# Patient Record
Sex: Female | Born: 1935
Health system: Southern US, Community
[De-identification: ages and names within clinical notes are randomized; demographics above are authoritative.]

## PROBLEM LIST (undated history)

## (undated) DIAGNOSIS — I82409 Acute embolism and thrombosis of unspecified deep veins of unspecified lower extremity: Secondary | ICD-10-CM

## (undated) DIAGNOSIS — M949 Disorder of cartilage, unspecified: Secondary | ICD-10-CM

## (undated) DIAGNOSIS — K219 Gastro-esophageal reflux disease without esophagitis: Secondary | ICD-10-CM

## (undated) DIAGNOSIS — M899 Disorder of bone, unspecified: Secondary | ICD-10-CM

## (undated) DIAGNOSIS — E210 Primary hyperparathyroidism: Secondary | ICD-10-CM

## (undated) DIAGNOSIS — I10 Essential (primary) hypertension: Secondary | ICD-10-CM

## (undated) HISTORY — DX: Disorder of bone, unspecified: M89.9

## (undated) HISTORY — DX: Disorder of cartilage, unspecified: M94.9

## (undated) HISTORY — DX: Primary hyperparathyroidism: E21.0

## (undated) HISTORY — DX: Essential (primary) hypertension: I10

## (undated) HISTORY — DX: Gastro-esophageal reflux disease without esophagitis: K21.9

## (undated) HISTORY — PX: ABDOMINAL HYSTERECTOMY: SHX81

---

## 1999-03-16 ENCOUNTER — Ambulatory Visit (HOSPITAL_COMMUNITY): Admission: RE | Admit: 1999-03-16 | Discharge: 1999-03-16 | Payer: Self-pay | Admitting: Internal Medicine

## 1999-03-16 ENCOUNTER — Encounter: Payer: Self-pay | Admitting: Internal Medicine

## 2002-01-06 ENCOUNTER — Ambulatory Visit (HOSPITAL_COMMUNITY): Admission: RE | Admit: 2002-01-06 | Discharge: 2002-01-06 | Payer: Self-pay | Admitting: Internal Medicine

## 2002-01-06 ENCOUNTER — Encounter: Payer: Self-pay | Admitting: Internal Medicine

## 2004-11-14 ENCOUNTER — Ambulatory Visit: Payer: Self-pay | Admitting: Internal Medicine

## 2005-03-13 ENCOUNTER — Ambulatory Visit: Payer: Self-pay | Admitting: Internal Medicine

## 2005-06-21 ENCOUNTER — Ambulatory Visit: Payer: Self-pay | Admitting: Internal Medicine

## 2005-09-20 ENCOUNTER — Ambulatory Visit: Payer: Self-pay | Admitting: Internal Medicine

## 2005-10-22 ENCOUNTER — Ambulatory Visit: Payer: Self-pay | Admitting: Internal Medicine

## 2006-02-27 ENCOUNTER — Ambulatory Visit: Payer: Self-pay | Admitting: Internal Medicine

## 2006-08-22 ENCOUNTER — Ambulatory Visit: Payer: Self-pay | Admitting: Internal Medicine

## 2007-01-23 ENCOUNTER — Ambulatory Visit (HOSPITAL_COMMUNITY): Admission: RE | Admit: 2007-01-23 | Discharge: 2007-01-23 | Payer: Self-pay | Admitting: Internal Medicine

## 2007-01-23 ENCOUNTER — Encounter: Payer: Self-pay | Admitting: Internal Medicine

## 2007-02-19 ENCOUNTER — Ambulatory Visit: Payer: Self-pay | Admitting: Internal Medicine

## 2007-02-19 LAB — CONVERTED CEMR LAB
ALT: 15 units/L (ref 0–40)
AST: 22 units/L (ref 0–37)
Albumin: 3.9 g/dL (ref 3.5–5.2)
Alkaline Phosphatase: 76 units/L (ref 39–117)
BUN: 17 mg/dL (ref 6–23)
Basophils Absolute: 0 10*3/uL (ref 0.0–0.1)
Basophils Relative: 0.1 % (ref 0.0–1.0)
Bilirubin, Direct: 0.1 mg/dL (ref 0.0–0.3)
CO2: 32 meq/L (ref 19–32)
Calcium: 10.8 mg/dL — ABNORMAL HIGH (ref 8.4–10.5)
Chloride: 110 meq/L (ref 96–112)
Cholesterol: 223 mg/dL (ref 0–200)
Creatinine, Ser: 0.8 mg/dL (ref 0.4–1.2)
Direct LDL: 145 mg/dL
Eosinophils Absolute: 0.1 10*3/uL (ref 0.0–0.6)
Eosinophils Relative: 1.9 % (ref 0.0–5.0)
GFR calc Af Amer: 90 mL/min
GFR calc non Af Amer: 75 mL/min
Glucose, Bld: 91 mg/dL (ref 70–99)
HCT: 42.8 % (ref 36.0–46.0)
HDL: 66.4 mg/dL (ref >39.0)
Hemoglobin: 14.7 g/dL (ref 12.0–15.0)
Lymphocytes Relative: 22.5 % (ref 12.0–46.0)
MCHC: 34.3 g/dL (ref 30.0–36.0)
MCV: 89.2 fL (ref 78.0–100.0)
Monocytes Absolute: 0.6 10*3/uL (ref 0.2–0.7)
Monocytes Relative: 10.1 % (ref 3.0–11.0)
Neutro Abs: 4 10*3/uL (ref 1.4–7.7)
Neutrophils Relative %: 65.4 % (ref 43.0–77.0)
Platelets: 235 10*3/uL (ref 150–400)
Potassium: 4.1 meq/L (ref 3.5–5.1)
RBC: 4.8 M/uL (ref 3.87–5.11)
RDW: 12.9 % (ref 11.5–14.6)
Sodium: 145 meq/L (ref 135–145)
TSH: 1.43 microintl units/mL (ref 0.35–5.50)
Total Bilirubin: 0.8 mg/dL (ref 0.3–1.2)
Total CHOL/HDL Ratio: 3.4
Total Protein: 6.7 g/dL (ref 6.0–8.3)
Triglycerides: 97 mg/dL (ref 0–149)
VLDL: 19 mg/dL (ref 0–40)
WBC: 6 10*3/uL (ref 4.5–10.5)

## 2007-04-01 ENCOUNTER — Ambulatory Visit: Payer: Self-pay | Admitting: Internal Medicine

## 2007-09-15 ENCOUNTER — Encounter: Payer: Self-pay | Admitting: Internal Medicine

## 2007-10-03 ENCOUNTER — Ambulatory Visit: Payer: Self-pay | Admitting: Internal Medicine

## 2007-10-03 DIAGNOSIS — K219 Gastro-esophageal reflux disease without esophagitis: Secondary | ICD-10-CM

## 2007-10-03 DIAGNOSIS — I10 Essential (primary) hypertension: Secondary | ICD-10-CM | POA: Insufficient documentation

## 2007-10-03 DIAGNOSIS — E210 Primary hyperparathyroidism: Secondary | ICD-10-CM

## 2007-10-03 HISTORY — DX: Essential (primary) hypertension: I10

## 2007-10-03 HISTORY — DX: Primary hyperparathyroidism: E21.0

## 2007-10-03 HISTORY — DX: Gastro-esophageal reflux disease without esophagitis: K21.9

## 2007-10-14 ENCOUNTER — Encounter: Payer: Self-pay | Admitting: Internal Medicine

## 2007-10-14 ENCOUNTER — Ambulatory Visit: Payer: Self-pay | Admitting: Internal Medicine

## 2007-11-04 ENCOUNTER — Ambulatory Visit: Payer: Self-pay | Admitting: Internal Medicine

## 2008-02-03 ENCOUNTER — Ambulatory Visit: Payer: Self-pay | Admitting: Internal Medicine

## 2008-02-03 DIAGNOSIS — M899 Disorder of bone, unspecified: Secondary | ICD-10-CM

## 2008-02-03 DIAGNOSIS — M949 Disorder of cartilage, unspecified: Secondary | ICD-10-CM

## 2008-02-03 HISTORY — DX: Disorder of bone, unspecified: M89.9

## 2008-02-19 ENCOUNTER — Encounter: Payer: Self-pay | Admitting: Internal Medicine

## 2008-05-25 ENCOUNTER — Ambulatory Visit: Payer: Self-pay | Admitting: Internal Medicine

## 2008-06-30 ENCOUNTER — Encounter: Payer: Self-pay | Admitting: Internal Medicine

## 2008-07-01 ENCOUNTER — Ambulatory Visit: Payer: Self-pay | Admitting: Internal Medicine

## 2008-07-02 LAB — CONVERTED CEMR LAB
ALT: 15 units/L (ref 0–35)
AST: 21 units/L (ref 0–37)
Albumin: 3.9 g/dL (ref 3.5–5.2)
Alkaline Phosphatase: 74 units/L (ref 39–117)
BUN: 18 mg/dL (ref 6–23)
Basophils Absolute: 0 10*3/uL (ref 0.0–0.1)
Basophils Relative: 0.4 % (ref 0.0–3.0)
Bilirubin, Direct: 0.2 mg/dL (ref 0.0–0.3)
CO2: 30 meq/L (ref 19–32)
Calcium: 10.8 mg/dL — ABNORMAL HIGH (ref 8.4–10.5)
Chloride: 105 meq/L (ref 96–112)
Creatinine, Ser: 1 mg/dL (ref 0.4–1.2)
Eosinophils Absolute: 0.1 10*3/uL (ref 0.0–0.7)
Eosinophils Relative: 2.6 % (ref 0.0–5.0)
GFR calc Af Amer: 70 mL/min
GFR calc non Af Amer: 57 mL/min
Glucose, Bld: 86 mg/dL (ref 70–99)
HCT: 42.1 % (ref 36.0–46.0)
Hemoglobin: 14.4 g/dL (ref 12.0–15.0)
Lymphocytes Relative: 24 % (ref 12.0–46.0)
MCHC: 34.1 g/dL (ref 30.0–36.0)
MCV: 88.6 fL (ref 78.0–100.0)
Monocytes Absolute: 0.6 10*3/uL (ref 0.1–1.0)
Monocytes Relative: 12 % (ref 3.0–12.0)
Neutro Abs: 2.8 10*3/uL (ref 1.4–7.7)
Neutrophils Relative %: 61 % (ref 43.0–77.0)
Platelets: 214 10*3/uL (ref 150–400)
Potassium: 4.1 meq/L (ref 3.5–5.1)
RBC: 4.75 M/uL (ref 3.87–5.11)
RDW: 12.8 % (ref 11.5–14.6)
Sodium: 141 meq/L (ref 135–145)
TSH: 1.25 microintl units/mL (ref 0.35–5.50)
Total Bilirubin: 0.9 mg/dL (ref 0.3–1.2)
Total Protein: 6.8 g/dL (ref 6.0–8.3)
WBC: 4.6 10*3/uL (ref 4.5–10.5)

## 2008-08-30 ENCOUNTER — Ambulatory Visit (HOSPITAL_COMMUNITY): Admission: RE | Admit: 2008-08-30 | Discharge: 2008-08-30 | Payer: Self-pay | Admitting: Internal Medicine

## 2008-10-28 ENCOUNTER — Ambulatory Visit: Payer: Self-pay | Admitting: Internal Medicine

## 2008-11-02 LAB — CONVERTED CEMR LAB: Calcium: 11.6 mg/dL — ABNORMAL HIGH (ref 8.4–10.5)

## 2009-12-26 ENCOUNTER — Ambulatory Visit: Payer: Self-pay | Admitting: Internal Medicine

## 2010-01-02 ENCOUNTER — Ambulatory Visit: Payer: Self-pay | Admitting: Internal Medicine

## 2010-01-02 ENCOUNTER — Encounter: Payer: Self-pay | Admitting: Internal Medicine

## 2010-01-05 ENCOUNTER — Telehealth: Payer: Self-pay | Admitting: Internal Medicine

## 2010-02-11 ENCOUNTER — Ambulatory Visit: Payer: Self-pay | Admitting: Family Medicine

## 2010-02-11 DIAGNOSIS — L089 Local infection of the skin and subcutaneous tissue, unspecified: Secondary | ICD-10-CM | POA: Insufficient documentation

## 2010-04-25 ENCOUNTER — Ambulatory Visit: Payer: Self-pay | Admitting: Internal Medicine

## 2010-04-25 LAB — CONVERTED CEMR LAB: Calcium: 11.4 mg/dL — ABNORMAL HIGH (ref 8.4–10.5)

## 2010-08-10 ENCOUNTER — Ambulatory Visit: Payer: Self-pay | Admitting: Internal Medicine

## 2010-08-21 ENCOUNTER — Ambulatory Visit: Payer: Self-pay | Admitting: Internal Medicine

## 2010-08-21 LAB — CONVERTED CEMR LAB: Calcium: 11 mg/dL — ABNORMAL HIGH (ref 8.4–10.5)

## 2010-12-10 LAB — CONVERTED CEMR LAB
ALT: 16 units/L (ref 0–35)
AST: 21 units/L (ref 0–37)
Albumin: 4.1 g/dL (ref 3.5–5.2)
Alkaline Phosphatase: 76 units/L (ref 39–117)
BUN: 18 mg/dL (ref 6–23)
Basophils Absolute: 0 10*3/uL (ref 0.0–0.1)
Basophils Relative: 0.1 % (ref 0.0–3.0)
Bilirubin, Direct: 0.1 mg/dL (ref 0.0–0.3)
CO2: 28 meq/L (ref 19–32)
Calcium, Total (PTH): 11.6 mg/dL — ABNORMAL HIGH (ref 8.4–10.5)
Calcium: 11 mg/dL — ABNORMAL HIGH (ref 8.4–10.5)
Chloride: 108 meq/L (ref 96–112)
Cholesterol: 218 mg/dL — ABNORMAL HIGH (ref 0–200)
Creatinine, Ser: 1 mg/dL (ref 0.4–1.2)
Direct LDL: 142.1 mg/dL
Eosinophils Absolute: 0.1 10*3/uL (ref 0.0–0.7)
Eosinophils Relative: 0.9 % (ref 0.0–5.0)
GFR calc non Af Amer: 57.12 mL/min (ref >60)
Glucose, Bld: 89 mg/dL (ref 70–99)
HCT: 42.1 % (ref 36.0–46.0)
HDL: 66.5 mg/dL (ref >39.00)
Hemoglobin: 13.8 g/dL (ref 12.0–15.0)
Lymphocytes Relative: 19 % (ref 12.0–46.0)
Lymphs Abs: 1.3 10*3/uL (ref 0.7–4.0)
MCHC: 32.9 g/dL (ref 30.0–36.0)
MCV: 91 fL (ref 78.0–100.0)
Monocytes Absolute: 0.6 10*3/uL (ref 0.1–1.0)
Monocytes Relative: 9 % (ref 3.0–12.0)
Neutro Abs: 4.6 10*3/uL (ref 1.4–7.7)
Neutrophils Relative %: 71 % (ref 43.0–77.0)
PTH: 227.7 pg/mL — ABNORMAL HIGH (ref 14.0–72.0)
Platelets: 210 10*3/uL (ref 150.0–400.0)
Potassium: 4.3 meq/L (ref 3.5–5.1)
RBC: 4.62 M/uL (ref 3.87–5.11)
RDW: 12.5 % (ref 11.5–14.6)
Sodium: 142 meq/L (ref 135–145)
TSH: 1.1 microintl units/mL (ref 0.35–5.50)
Total Bilirubin: 0.8 mg/dL (ref 0.3–1.2)
Total CHOL/HDL Ratio: 3
Total Protein: 6.7 g/dL (ref 6.0–8.3)
Triglycerides: 78 mg/dL (ref 0.0–149.0)
VLDL: 15.6 mg/dL (ref 0.0–40.0)
WBC: 6.6 10*3/uL (ref 4.5–10.5)

## 2010-12-14 NOTE — Progress Notes (Signed)
Summary: lab report  Phone Note Call from Patient   Caller: Patient Call For: Gordy Savers  MD Summary of Call: wants to know lab report and wants a copy mailed Initial call taken by: Raechel Ache, RN,  January 05, 2010 9:03 AM  Follow-up for Phone Call        spoke with pt - informed of labs , stable , mailed copy to pt. KIK Follow-up by: Duard Brady LPN,  January 05, 2010 10:15 AM

## 2010-12-14 NOTE — Assessment & Plan Note (Signed)
Summary: 4 MONTH FOLLOW UP/CJR   Vital Signs:  Patient profile:   77 year old female Weight:      167 pounds Temp:     97.8 degrees F oral BP sitting:   132 / 78  (left arm) Cuff size:   regular  Vitals Entered By: Duard Brady LPN (April 25, 2010 8:24 AM) CC: 4 mos rov - doing well , has cataract surg on (R) eye Is Patient Diabetic? No   Primary Care Provider:  Gordy Savers  MD  CC:  4 mos rov - doing well  and has cataract surg on (R) eye.  History of Present Illness: 77 year old patient who is seen today for follow-up of hypertensionand primary hyperparathyroidism.  She has a history of gastroesophageal reflux disease.  She is doing quite well.  Since her visit here last.  She has had a normal bone density study performed.  No concerns or complaints.  Reflux symptoms are stable.  Preventive Screening-Counseling & Management  Alcohol-Tobacco     Smoking Status: never  Allergies (verified): No Known Drug Allergies  Past History:  Past Medical History: Reviewed history from 02/03/2008 and no changes required. Hypertension GERD primary hyperparathyroidism Osteopenia  Review of Systems  The patient denies anorexia, fever, weight loss, weight gain, vision loss, decreased hearing, hoarseness, chest pain, syncope, dyspnea on exertion, peripheral edema, prolonged cough, headaches, hemoptysis, abdominal pain, melena, hematochezia, severe indigestion/heartburn, hematuria, incontinence, genital sores, muscle weakness, suspicious skin lesions, transient blindness, difficulty walking, depression, unusual weight change, abnormal bleeding, enlarged lymph nodes, angioedema, and breast masses.    Physical Exam  General:  overweight-appearing.  130/80overweight-appearing.   Head:  Normocephalic and atraumatic without obvious abnormalities. No apparent alopecia or balding. Mouth:  Oral mucosa and oropharynx without lesions or exudates.  Teeth in good repair. Neck:  No  deformities, masses, or tenderness noted. Lungs:  Normal respiratory effort, chest expands symmetrically. Lungs are clear to auscultation, no crackles or wheezes. Heart:  Normal rate and regular rhythm. S1 and S2 normal without gallop, murmur, click, rub or other extra sounds. Abdomen:  obese soft and nontender.  No organomegaly. Msk:  No deformity or scoliosis noted of thoracic or lumbar spine.     Impression & Recommendations:  Problem # 1:  PRIMARY HYPERPARATHYROIDISM (ICD-252.01)  Problem # 2:  HYPERTENSION (ICD-401.9)  Her updated medication list for this problem includes:    Benazepril Hcl 40 Mg Tabs (Benazepril hcl) .Marland Kitchen... 1 once daily    Furosemide 40 Mg Tabs (Furosemide) ..... One daily    Her updated medication list for this problem includes:    Benazepril Hcl 40 Mg Tabs (Benazepril hcl) .Marland Kitchen... 1 once daily    Furosemide 40 Mg Tabs (Furosemide) ..... One daily  Problem # 3:  GERD (ICD-530.81)  Her updated medication list for this problem includes:    [facility] Omeprazole 20 Mg Tbec (Omeprazole) .Marland Kitchen... 1 once daily    Her updated medication list for this problem includes:    [facility] Omeprazole 20 Mg Tbec (Omeprazole) .Marland Kitchen... 1 once daily  Complete Medication List: 1)  Benazepril Hcl 40 Mg Tabs (Benazepril hcl) .Marland Kitchen.. 1 once daily 2)  [facility] Omeprazole 20 Mg Tbec (Omeprazole) .Marland Kitchen.. 1 once daily 3)  Furosemide 40 Mg Tabs (Furosemide) .... One daily 4)  Bactroban 2 % Oint (Mupirocin) .... Use three times a day x 10 days as directed 5)  Travatan Z 0.004 % Soln (Travoprost) .... (r) eye qhs  Other Orders: TLB-Calcium (82310-CA) Prescription Created Electronically (  [email or web address])  Patient Instructions: 1)  Please schedule a follow-up appointment in 4 months. 2)  Limit your Sodium (Salt) to less than 2 grams a day(slightly less than 1/2 a teaspoon) to prevent fluid retention, swelling, or worsening of symptoms. 3)  It is important that you exercise regularly at least 20 minutes 5 times a week. If  you develop chest pain, have severe difficulty breathing, or feel very tired , stop exercising immediately and seek medical attention. 4)  You need to lose weight. Consider a lower calorie diet and regular exercise.  5)  Check your Blood Pressure regularly. If it is above: 150/90 you should make an appointment. Prescriptions: FUROSEMIDE 40 MG  TABS (FUROSEMIDE) one daily  #90 x 6   Entered and Authorized by:   Gordy Savers  MD   Signed by:   Gordy Savers  MD on 04/25/2010   Method used:   Electronically to        [facility]  [phone]* (retail)       [address]       [place], [place]  [address]       Ph: [phone] or [phone]       Fax: [phone]   RxID:   [id number] [facility] OMEPRAZOLE 20 MG  TBEC (OMEPRAZOLE) 1 once daily  #90 x 6   Entered and Authorized by:   Gordy Savers  MD   Signed by:   Gordy Savers  MD on 04/25/2010   Method used:   Electronically to        [facility]  [phone]* (retail)       [address]       [place], [place]  [address]       Ph: [phone] or [phone]       Fax: [phone]   RxID:   [id number] BENAZEPRIL HCL 40 MG  TABS (BENAZEPRIL HCL) 1 once daily  #90 Each x 5   Entered and Authorized by:   Gordy Savers  MD   Signed by:   Gordy Savers  MD on 04/25/2010   Method used:   Electronically to        [facility]  [phone]* (retail)       [address]       [place], [place]  [address]       Ph: [phone] or [phone]       Fax: [phone]   RxID:   [id number]   Appended Document: Orders Update    Clinical Lists Changes  Orders: Added new Service order of Venipuncture [phone]) - Signed

## 2010-12-14 NOTE — Assessment & Plan Note (Signed)
Summary: pt will come in fasting/njr   Vital Signs:  Patient profile:   77 year old female Height:      63 inches Weight:      169 pounds BMI:     30.05 Temp:     97.7 degrees F oral BP sitting:   150 / 90  (right arm) Cuff size:   regular  Vitals Entered By: Duard Brady LPN (December 26, 2009 8:29 AM) CC: CPX - no real c/o , questions r/t bone density   CC:  CPX - no real c/o  and questions r/t bone density.  History of Present Illness: 77 year old patient who is seen today for a comprehensive evaluation.  Medical prompt include hypertension.  She does track her blood pressure at home with nice readings.  She traditionally runs a bit higher in the office setting.  She has a history of primary hyperparathyroidism, but has not been seen in over one year.  She is on furosemide for additional  blood pressure control.  She feels well.  Her only complaints are musculoskeletal.  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  She has a history of osteopenia, but unable to afford treatment  Allergies: No Known Drug Allergies  Past History:  Past Medical History: Reviewed history from 02/03/2008 and no changes required. Hypertension GERD primary hyperparathyroidism Osteopenia  Past Surgical History: gravida one, para one, abortus zero Hysterectomy sigmoidoscopy 2003 Bone Density 2008  Family History: Reviewed history from 10/03/2007 and no changes required. father died age 37  suicidemother died age 27, pancreatic cancer  Two half-brothers one with prostate cancer  Social History: Reviewed history from 07/01/2008 and no changes required. Married Never Smoked  Review of Systems  The patient denies anorexia, fever, weight loss, weight gain, vision loss, decreased hearing, hoarseness, chest pain, syncope, dyspnea on exertion, peripheral edema, prolonged cough, headaches, hemoptysis, abdominal pain, melena, hematochezia, severe indigestion/heartburn, hematuria,  incontinence, genital sores, muscle weakness, suspicious skin lesions, transient blindness, difficulty walking, depression, unusual weight change, abnormal bleeding, enlarged lymph nodes, angioedema, and breast masses.    Physical Exam  General:  overweight-appearing.  140/9overweight-appearing.   Head:  Normocephalic and atraumatic without obvious abnormalities. No apparent alopecia or balding. Eyes:  right thyroidectomy scar Ears:  External ear exam shows no significant lesions or deformities.  Otoscopic examination reveals clear canals, tympanic membranes are intact bilaterally without bulging, retraction, inflammation or discharge. Hearing is grossly normal bilaterally. Nose:  External nasal examination shows no deformity or inflammation. Nasal mucosa are pink and moist without lesions or exudates. Mouth:  Oral mucosa and oropharynx without lesions or exudates.  scattered dentition missing Neck:  No deformities, masses, or tenderness noted. Chest Wall:  No deformities, masses, or tenderness noted. Breasts:  No mass, nodules, thickening, tenderness, bulging, retraction, inflamation, nipple discharge or skin changes noted.   Lungs:  Normal respiratory effort, chest expands symmetrically. Lungs are clear to auscultation, no crackles or wheezes. Heart:  Normal rate and regular rhythm. S1 and S2 normal without gallop, murmur, click, rub or other extra sounds. Abdomen:  Bowel sounds positive,abdomen soft and non-tender without masses, organomegaly or hernias noted. lower midline scar Rectal:  No external abnormalities noted. Normal sphincter tone. No rectal masses or tenderness. Genitalia:  status post hysterectomy Msk:  No deformity or scoliosis noted of thoracic or lumbar spine.   Pulses:  R and L carotid,radial,femoral,dorsalis pedis and posterior tibial pulses are full and equal bilaterally Extremities:  No clubbing, cyanosis, edema, or deformity noted  with normal full range of motion of all  joints.   Neurologic:  No cranial nerve deficits noted. Station and gait are normal. Plantar reflexes are down-going bilaterally. DTRs are symmetrical throughout. Sensory, motor and coordinative functions appear intact. Skin:  Intact without suspicious lesions or rashes Cervical Nodes:  No lymphadenopathy noted Axillary Nodes:  No palpable lymphadenopathy Inguinal Nodes:  No significant adenopathy Psych:  Cognition and judgment appear intact. Alert and cooperative with normal attention span and concentration. No apparent delusions, illusions, hallucinations   Impression & Recommendations:  Problem # 1:  OSTEOPENIA (ICD-733.90)  The following medications were removed from the medication list:    Alendronate Sodium 35 Mg Tabs (Alendronate sodium) ..... One weekly    Actonel 150 Mg Tabs (Risedronate sodium) ..... One monthly    The following medications were removed from the medication list:    Alendronate Sodium 35 Mg Tabs (Alendronate sodium) ..... One weekly    Actonel 150 Mg Tabs (Risedronate sodium) ..... One monthly  Orders: Venipuncture (04540) TLB-Lipid Panel (80061-LIPID) TLB-BMP (Basic Metabolic Panel-BMET) (80048-METABOL) TLB-CBC Platelet - w/Differential (85025-CBCD) TLB-Hepatic/Liver Function Pnl (80076-HEPATIC) TLB-TSH (Thyroid Stimulating Hormone) (84443-TSH)  Problem # 2:  PRIMARY HYPERPARATHYROIDISM (ICD-252.01)  Orders: Venipuncture (98119) TLB-Lipid Panel (80061-LIPID) TLB-BMP (Basic Metabolic Panel-BMET) (80048-METABOL) TLB-CBC Platelet - w/Differential (85025-CBCD) TLB-Hepatic/Liver Function Pnl (80076-HEPATIC) T-Parathyroid Hormone, Intact w/ Calcium (14782-95621)  Problem # 3:  GERD (ICD-530.81)  Her updated medication list for this problem includes:    [facility] Omeprazole 20 Mg Tbec (Omeprazole) .Marland Kitchen... 1 once daily    Her updated medication list for this problem includes:    [facility] Omeprazole 20 Mg Tbec (Omeprazole) .Marland Kitchen... 1 once  daily  Orders: Venipuncture (30865) TLB-Lipid Panel (80061-LIPID) TLB-BMP (Basic Metabolic Panel-BMET) (80048-METABOL) TLB-CBC Platelet - w/Differential (85025-CBCD) TLB-Hepatic/Liver Function Pnl (80076-HEPATIC)  Problem # 4:  HYPERTENSION (ICD-401.9)  The following medications were removed from the medication list:    Amlodipine Besylate 5 Mg Tabs (Amlodipine besylate) ..... One daily Her updated medication list for this problem includes:    Benazepril Hcl 40 Mg Tabs (Benazepril hcl) .Marland Kitchen... 1 once daily    Furosemide 40 Mg Tabs (Furosemide) ..... One daily    The following medications were removed from the medication list:    Amlodipine Besylate 5 Mg Tabs (Amlodipine besylate) ..... One daily Her updated medication list for this problem includes:    Benazepril Hcl 40 Mg Tabs (Benazepril hcl) .Marland Kitchen... 1 once daily    Furosemide 40 Mg Tabs (Furosemide) ..... One daily  Orders: EKG w/ Interpretation (93000) Venipuncture (78469) TLB-Lipid Panel (80061-LIPID) TLB-BMP (Basic Metabolic Panel-BMET) (80048-METABOL) TLB-CBC Platelet - w/Differential (85025-CBCD) TLB-Hepatic/Liver Function Pnl (80076-HEPATIC)  Complete Medication List: 1)  Benazepril Hcl 40 Mg Tabs (Benazepril hcl) .Marland Kitchen.. 1 once daily 2)  [facility]vs Omeprazole 20 Mg Tbec (Omeprazole) .Marland Kitchen.. 1 once daily 3)  Furosemide 40 Mg Tabs (Furosemide) .... One daily  Other Orders: Pelvic & Breast Exam ( Medicare)  (G2952)  Patient Instructions: 1)  Please schedule a follow-up appointment in 4 months. 2)  Limit your Sodium (Salt). 3)  It is important that you exercise regularly at least 20 minutes 5 times a week. If you develop chest pain, have severe difficulty breathing, or feel very tired , stop exercising immediately and seek medical attention. 4)  Check your Blood Pressure regularly. If it is above:  150/90 you should make an appointment. Prescriptions: FUROSEMIDE 40 MG  TABS (FUROSEMIDE) one daily  #90 x 6   Entered and Authorized  by:  Gordy Savers  MD   Signed by:   Gordy Savers  MD on 12/26/2009   Method used:   Print then Give to Patient   RxID:   [id number] [facility] OMEPRAZOLE 20 MG  TBEC (OMEPRAZOLE) 1 once daily  #90 x 6   Entered and Authorized by:   Gordy Savers  MD   Signed by:   Gordy Savers  MD on 12/26/2009   Method used:   Print then Give to Patient   RxID:   [id number] BENAZEPRIL HCL 40 MG  TABS (BENAZEPRIL HCL) 1 once daily  #90 Each x 5   Entered and Authorized by:   Gordy Savers  MD   Signed by:   Gordy Savers  MD on 12/26/2009   Method used:   Print then Give to Patient   RxID:   [id number]

## 2010-12-14 NOTE — Assessment & Plan Note (Signed)
Summary: BUMP IN HER NOSE--INFECTED//VGJ   Vital Signs:  Patient profile:   77 year old female Height:      63 inches Weight:      166 pounds BMI:     29.51 O2 Sat:      97 % on Room air Temp:     97.2 degrees F oral Pulse rate:   69 / minute BP sitting:   160 / 82  (left arm) Cuff size:   regular  Vitals Entered By: Payton Spark CMA (February 11, 2010 9:47 AM)  O2 Flow:  Room air CC: Sinus congestion and pressure around the nose.    Primary Care Provider:  Gordy Savers  MD  CC:  Sinus congestion and pressure around the nose. Annette [name]  History of Present Illness: 77 yo WF presents for pain around her nose that started about 2 wks ago with progressive swelling.  She has a bump inside her nose that is tender.  She has been taking ibuprofen.  She is just using nasal saline and nothing else.  Denies epistaxis.  Has a clear/ yellow discharge.  Denies fevers or chils.  Denies dental pain, sore throat.    Current Medications (verified): 1)  Benazepril Hcl 40 Mg  Tabs (Benazepril Hcl) .Annette [name].. 1 Once Daily 2)  [facility]vs Omeprazole 20 Mg  Tbec (Omeprazole) .Annette [name].. 1 Once Daily 3)  Furosemide 40 Mg  Tabs (Furosemide) .... One Daily  Allergies (verified): No Known Drug Allergies  Past History:  Past Medical History: Reviewed history from 02/03/2008 and no changes required. Hypertension GERD primary hyperparathyroidism Osteopenia  Past Surgical History: Reviewed history from 12/26/2009 and no changes required. gravida one, para one, abortus zero Hysterectomy sigmoidoscopy 2003 Bone Density 2008  Social History: Reviewed history from 07/01/2008 and no changes required. Married Never Smoked  Review of Systems      See HPI  Physical Exam  General:  alert, well-developed, well-nourished, and well-hydrated.   Head:  normocephalic and atraumatic.   Eyes:  conjunctiva clear Nose:  small pustule inside the superior L nares mild hypertrophy of the turbinates with scant rhinorrhea mild  external rhinophyma changes Mouth:  pharynx pink and moist and fair dentition.   Lungs:  Normal respiratory effort, chest expands symmetrically. Lungs are clear to auscultation, no crackles or wheezes. Heart:  Normal rate and regular rhythm. S1 and S2 normal without gallop, murmur, click, rub or other extra sounds. Skin:  color normal.     Impression & Recommendations:  Problem # 1:  UNSPEC LOCAL INFECTION SKIN&SUBCUTANEOUS TISSUE (ICD-686.9) Small L nares  pustule causing localized nasal pain. Treat with topical bactroban ointment.  Avoid touching. Call Dr Kirtland Bouchard for f/u if not improved in 7-10 days.  Complete Medication List: 1)  Benazepril Hcl 40 Mg Tabs (Benazepril hcl) .Annette [name].. 1 once daily 2)  [facility]vs Omeprazole 20 Mg Tbec (Omeprazole) .Annette [name].. 1 once daily 3)  Furosemide 40 Mg Tabs (Furosemide) .... One daily 4)  Bactroban 2 % Oint (Mupirocin) .... Use three times a day x 10 days as directed  Patient Instructions: 1)  use topical Bactroban ointment on nasal infection x 10 days. 2)  Call Dr Kirtland Bouchard if not seeing improvement in 7-10 days.   Prescriptions: BACTROBAN 2 % OINT (MUPIROCIN) use three times a day x 10 days as directed  #22 g x 0   Entered and Authorized by:   Seymour Bars DO   Signed by:   Seymour Bars DO on 02/11/2010   Method used:  Electronically to        [facility]  [phone]* (retail)       [address]       [place], [place]  [address]       Ph: [phone] or [phone]       Fax: [phone]   RxID:   [id number]

## 2010-12-14 NOTE — Assessment & Plan Note (Signed)
Summary: 4 month fup//cm   Vital Signs:  Patient profile:   77 year old female Weight:      163 pounds Temp:     98.1 degrees F oral BP sitting:   140 / 90  (right arm) Cuff size:   regular  Vitals Entered By: Duard Brady LPN (August 21, 2010 10:34 AM) CC: 4 mos rov - doing well Is Patient Diabetic? No   Primary Care Provider:  Gordy Savers  MD  CC:  4 mos rov - doing well.  History of Present Illness: 77 year old patient who is seen today for follow-up.  She has a history of hypertension.  Additionally, she has osteopenia and a history of primary hyperparathyroidism.  She is on medications for chronic gastroesophageal reflux disease.  Since last visit.  She has done well.  There has been some modest weight loss.  No cardiopulmonary complaints.  Her dyspepsia has been stable  Allergies (verified): No Known Drug Allergies  Past History:  Past Medical History: Reviewed history from 02/03/2008 and no changes required. Hypertension GERD primary hyperparathyroidism Osteopenia  Past Surgical History: Reviewed history from 12/26/2009 and no changes required. gravida one, para one, abortus zero Hysterectomy sigmoidoscopy 2003 Bone Density 2008  Family History: Reviewed history from 10/03/2007 and no changes required. father died age 33  suicidemother died age 64, pancreatic cancer  Two half-brothers one with prostate cancer  Review of Systems  The patient denies anorexia, fever, weight loss, weight gain, vision loss, decreased hearing, hoarseness, chest pain, syncope, dyspnea on exertion, peripheral edema, prolonged cough, headaches, hemoptysis, abdominal pain, melena, hematochezia, severe indigestion/heartburn, hematuria, incontinence, genital sores, muscle weakness, suspicious skin lesions, transient blindness, difficulty walking, depression, unusual weight change, abnormal bleeding, enlarged lymph nodes, angioedema, and breast masses.    Physical  Exam  General:  overweight-appearing.  130/82overweight-appearing.   Head:  Normocephalic and atraumatic without obvious abnormalities. No apparent alopecia or balding. Eyes:  No corneal or conjunctival inflammation noted. EOMI. Perrla. Funduscopic exam benign, without hemorrhages, exudates or papilledema. Vision grossly normal. Mouth:  Oral mucosa and oropharynx without lesions or exudates.  Teeth in good repair. Neck:  No deformities, masses, or tenderness noted. Lungs:  Normal respiratory effort, chest expands symmetrically. Lungs are clear to auscultation, no crackles or wheezes. Heart:  Normal rate and regular rhythm. S1 and S2 normal without gallop, murmur, click, rub or other extra sounds. Abdomen:  Bowel sounds positive,abdomen soft and non-tender without masses, organomegaly or hernias noted. Msk:  No deformity or scoliosis noted of thoracic or lumbar spine.   Pulses:  R and L carotid,radial,femoral,dorsalis pedis and posterior tibial pulses are full and equal bilaterally Extremities:  No clubbing, cyanosis, edema, or deformity noted with normal full range of motion of all joints.     Impression & Recommendations:  Problem # 1:  PRIMARY HYPERPARATHYROIDISM (ICD-252.01)  Orders: Venipuncture (16109) TLB-Calcium (82310-CA) Specimen Handling (60454)  Problem # 2:  GERD (ICD-530.81)  Her updated medication list for this problem includes:    [facility]vs Omeprazole 20 Mg Tbec (Omeprazole) .Marland Kitchen... 1 once daily  Her updated medication list for this problem includes:    [facility]vs Omeprazole 20 Mg Tbec (Omeprazole) .Marland Kitchen... 1 once daily  Problem # 3:  HYPERTENSION (ICD-401.9)  Her updated medication list for this problem includes:    Benazepril Hcl 40 Mg Tabs (Benazepril hcl) .Marland Kitchen... 1 once daily    Furosemide 40 Mg Tabs (Furosemide) ..... One daily  Her updated medication list for this problem includes:  Benazepril Hcl 40 Mg Tabs (Benazepril hcl) .Marland Kitchen... 1 once daily    Furosemide 40 Mg Tabs  (Furosemide) ..... One daily  Problem # 4:  OSTEOPENIA (ICD-733.90)  Complete Medication List: 1)  Benazepril Hcl 40 Mg Tabs (Benazepril hcl) .Marland Kitchen.. 1 once daily 2)  [facility]vs Omeprazole 20 Mg Tbec (Omeprazole) .Marland Kitchen.. 1 once daily 3)  Furosemide 40 Mg Tabs (Furosemide) .... One daily 4)  Travatan Z 0.004 % Soln (Travoprost) .... (r) eye qhs  Patient Instructions: 1)  Please schedule a follow-up appointment in 4 months. 2)  Limit your Sodium (Salt) to less than 2 grams a day(slightly less than 1/2 a teaspoon) to prevent fluid retention, swelling, or worsening of symptoms. 3)  It is important that you exercise regularly at least 20 minutes 5 times a week. If you develop chest pain, have severe difficulty breathing, or feel very tired , stop exercising immediately and seek medical attention. 4)  You need to lose weight. Consider a lower calorie diet and regular exercise.  5)  Take calcium +Vitamin D daily. 6)  Check your Blood Pressure regularly. If it is above: 150/90 you should make an appointment.

## 2010-12-14 NOTE — Miscellaneous (Signed)
Summary: BONE DENSITY  Clinical Lists Changes  Orders: Added new Test order of T-Bone Densitometry (77080) - Signed Added new Test order of T-Lumbar Vertebral Assessment (77082) - Signed 

## 2010-12-14 NOTE — Assessment & Plan Note (Signed)
Summary: flu shot//alp  Nurse Visit   Vital Signs:  Patient profile:   77 year old female Temp:     98.2 degrees F oral  Vitals Entered By: Duard Brady LPN (August 10, 2010 12:50 PM) Flu Vaccine Consent Questions     Do you have a history of severe allergic reactions to this vaccine? no    Any prior history of allergic reactions to egg and/or gelatin? no    Do you have a sensitivity to the preservative Thimersol? no    Do you have a past history of Guillan-Barre Syndrome? no    Do you currently have an acute febrile illness? no    Have you ever had a severe reaction to latex? no    Vaccine information given and explained to patient? yes    Are you currently pregnant? no    Lot Number:[id number]   Exp Date:05/12/2011   Site Given  Left Deltoid IM  Allergies: No Known Drug Allergies  Orders Added: 1)  Flu Vaccine 87yrs + MEDICARE PATIENTS [Q2039] 2)  Administration Flu vaccine - MCR [G0008]

## 2010-12-27 ENCOUNTER — Encounter: Payer: Self-pay | Admitting: Internal Medicine

## 2010-12-29 ENCOUNTER — Ambulatory Visit (INDEPENDENT_AMBULATORY_CARE_PROVIDER_SITE_OTHER): Payer: Medicare Other | Admitting: Internal Medicine

## 2010-12-29 ENCOUNTER — Encounter: Payer: Self-pay | Admitting: Internal Medicine

## 2010-12-29 DIAGNOSIS — K219 Gastro-esophageal reflux disease without esophagitis: Secondary | ICD-10-CM

## 2010-12-29 DIAGNOSIS — E210 Primary hyperparathyroidism: Secondary | ICD-10-CM

## 2010-12-29 DIAGNOSIS — I10 Essential (primary) hypertension: Secondary | ICD-10-CM

## 2010-12-29 MED ORDER — OMEPRAZOLE 20 MG PO CPDR: 20.0000 mg | DELAYED_RELEASE_CAPSULE | Freq: Every day | ORAL | Status: DC

## 2010-12-29 MED ORDER — BENAZEPRIL HCL 40 MG PO TABS: 40.0000 mg | ORAL_TABLET | Freq: Every day | ORAL | Status: DC

## 2010-12-29 MED ORDER — FUROSEMIDE 40 MG PO TABS: 40.0000 mg | ORAL_TABLET | Freq: Every day | ORAL | Status: DC

## 2010-12-29 NOTE — Patient Instructions (Signed)
Limit your sodium (Salt) intake   It is important that you exercise regularly, at least 20 minutes 3 to 4 times per week.  If you develop chest pain or shortness of breath seek  medical attention. Please check your blood pressure on a regular basis.  If it is consistently greater than 150/90, please make an office appointment.  Avoids foods high in acid such as tomatoes citrus juices, and spicy foods.  Avoid eating within two hours of lying down or before exercising.  Do not overheat.  Try smaller more frequent meals.  If symptoms persist, elevate the head of her bed 12 inches while sleeping.  Return in 3 months for follow-up

## 2010-12-29 NOTE — Progress Notes (Signed)
  Subjective:    Patient ID: Annette [name], female    DOB: [birth year], 77 y.o.   MRN: [id number]  HPI  77 year old patient who is seen today for follow-up of her hypertension.  This has been stable.  She does monitor home blood pressure readings with nice results. She has primary hyperparathyroidism and serum calcium levels have been stable at approximately 11 mg percent.  She remains asymptomatic. She has a history of osteopenia as well as zoster arthritis, which have been stable She remains on omeprazole for gastroesophageal reflux disease She has glaucoma and cataracts and is scheduled for a eye examinations and    Review of Systems  Constitutional: Negative.   HENT: Negative for hearing loss, congestion, sore throat, rhinorrhea, dental problem, sinus pressure and tinnitus.   Eyes: Negative for pain, discharge and visual disturbance.  Respiratory: Negative for cough and shortness of breath.   Cardiovascular: Negative for chest pain, palpitations and leg swelling.  Gastrointestinal: Negative for nausea, vomiting, abdominal pain, diarrhea, constipation, blood in stool and abdominal distention.  Genitourinary: Negative for dysuria, urgency, frequency, hematuria, flank pain, vaginal bleeding, vaginal discharge, difficulty urinating, vaginal pain and pelvic pain.  Musculoskeletal: Negative for joint swelling, arthralgias and gait problem.  Skin: Negative for rash.  Neurological: Negative for dizziness, syncope, speech difficulty, weakness, numbness and headaches.  Hematological: Negative for adenopathy.  Psychiatric/Behavioral: Negative for behavioral problems, dysphoric mood and agitation. The patient is not nervous/anxious.        Objective:   Physical Exam  Constitutional: She is oriented to person, place, and time. She appears well-developed and well-nourished.       Overweight Blood pressure 130/84  HENT:  Head: Normocephalic.  Right Ear: External ear normal.  Left Ear:  External ear normal.  Mouth/Throat: Oropharynx is clear and moist.  Eyes: Conjunctivae and EOM are normal. Pupils are equal, round, and reactive to light.  Neck: Normal range of motion. Neck supple. No thyromegaly present.  Cardiovascular: Normal rate, regular rhythm, normal heart sounds and intact distal pulses.   Pulmonary/Chest: Effort normal and breath sounds normal.       A few crackles, right base  Abdominal: Soft. Bowel sounds are normal. She exhibits no mass. There is no tenderness.  Musculoskeletal: Normal range of motion.  Lymphadenopathy:    She has no cervical adenopathy.  Neurological: She is alert and oriented to person, place, and time.  Skin: Skin is warm and dry. No rash noted.  Psychiatric: She has a normal mood and affect. Her behavior is normal.          Assessment & Plan:  Hypertension stable.  Will continue present regimen prescriptions refilled Hypercalcemia secondary to primary hyperparathyroidism.  Will continue on furosemide for blood pressure control.  Will check a serum calcium today Gastroesophageal reflux disease, stable

## 2011-03-30 NOTE — Assessment & Plan Note (Signed)
 HEALTHCARE                            [facility] OFFICE NOTE   NAME:Annette [name], Annette [name]                      MRN:          [id number]  DATE:02/19/2007                            DOB:          22-May-1930    A 77 year old female seen today for an annual exam.  She has a history  of hypertension, primary hyperparathyroidism, DJD.  Only complaint today  is pain involving the right knee.  She is status post hysterectomy over  30 years ago.  She is gravida 1, para 1, abortus 0.  She has  gastroesophageal reflux disease which has been well controlled on  omeprazole.  She states that she monitors her blood pressure at home  with readings in the 130/80 range.   REVIEW OF SYSTEMS EXAM:  Had a screening sigmoidoscopy in 2003, declines  full colonoscopy today; also she declines pelvic and rectal exam.   FAMILY HISTORY:  Father died of suicide death at 57.  Mother died at 15  of pancreatic cancer.  Half brother had prostate cancer.  No family  history of colon cancer, although her husband was affected.   EXAMINATION:  Revealed a healthy-appearing, mildly overweight white  female.  Blood pressure is 150-160/90-100.  FUNDI, EAR, NOSE, AND THROAT:  Clear.  NECK:  No bruits.  CHEST:  Clear.  BREASTS:  Negative.  CARDIOVASCULAR:  Normal heart sounds, no murmurs.  ABDOMEN:  Obese, soft, and nontender.  No organomegaly and no bruits  appreciated.  EXTREMITIES:  Revealed full peripheral pulses, there is no edema.  She  had difficulty fully extending her right knee due to discomfort.   IMPRESSION:  1. Hypertension.  2. Degenerative joint disease.  3. Hypercalcemia, secondary to primary hyperparathyroidism.   DISPOSITION:  Calcium level and other chemistries will be reviewed.  A  screening colonoscopy encouraged, she wishes to defer this for a year.  Her mammogram was just performed and was unremarkable.  I have asked her  to return in 6 weeks for a blood pressure  check, she has also been asked  to bring her home cuff.     Gordy Savers, MD  Electronically Signed    PFK/MedQ  DD: 02/19/2007  DT: 02/19/2007  Job #: [id number]

## 2011-04-27 ENCOUNTER — Encounter: Payer: Self-pay | Admitting: Internal Medicine

## 2011-04-27 ENCOUNTER — Ambulatory Visit (INDEPENDENT_AMBULATORY_CARE_PROVIDER_SITE_OTHER): Payer: Medicare Other | Admitting: Internal Medicine

## 2011-04-27 DIAGNOSIS — M949 Disorder of cartilage, unspecified: Secondary | ICD-10-CM

## 2011-04-27 DIAGNOSIS — E785 Hyperlipidemia, unspecified: Secondary | ICD-10-CM

## 2011-04-27 DIAGNOSIS — M899 Disorder of bone, unspecified: Secondary | ICD-10-CM

## 2011-04-27 DIAGNOSIS — I10 Essential (primary) hypertension: Secondary | ICD-10-CM

## 2011-04-27 DIAGNOSIS — E210 Primary hyperparathyroidism: Secondary | ICD-10-CM

## 2011-04-27 DIAGNOSIS — K219 Gastro-esophageal reflux disease without esophagitis: Secondary | ICD-10-CM

## 2011-04-27 LAB — HEPATIC FUNCTION PANEL
ALT: 13 U/L (ref 0–35)
AST: 18 U/L (ref 0–37)
Alkaline Phosphatase: 91 U/L (ref 39–117)
Bilirubin, Direct: 0.2 mg/dL (ref 0.0–0.3)
Total Bilirubin: 0.8 mg/dL (ref 0.3–1.2)
Total Protein: 6.8 g/dL (ref 6.0–8.3)

## 2011-04-27 LAB — CBC WITH DIFFERENTIAL/PLATELET
Basophils Relative: 0.3 % (ref 0.0–3.0)
Eosinophils Relative: 0.9 % (ref 0.0–5.0)
HCT: 39.4 % (ref 36.0–46.0)
Hemoglobin: 13.4 g/dL (ref 12.0–15.0)
Lymphs Abs: 1.2 10*3/uL (ref 0.7–4.0)
MCV: 89 fl (ref 78.0–100.0)
Monocytes Absolute: 0.5 10*3/uL (ref 0.1–1.0)
Monocytes Relative: 8.6 % (ref 3.0–12.0)
Neutro Abs: 4.5 10*3/uL (ref 1.4–7.7)
Platelets: 211 10*3/uL (ref 150.0–400.0)
RBC: 4.43 Mil/uL (ref 3.87–5.11)
WBC: 6.3 10*3/uL (ref 4.5–10.5)

## 2011-04-27 LAB — BASIC METABOLIC PANEL
CO2: 27 mEq/L (ref 19–32)
Calcium: 10.9 mg/dL — ABNORMAL HIGH (ref 8.4–10.5)
Chloride: 105 mEq/L (ref 96–112)
Potassium: 4.6 mEq/L (ref 3.5–5.1)
Sodium: 138 mEq/L (ref 135–145)

## 2011-04-27 LAB — LIPID PANEL: Total CHOL/HDL Ratio: 3

## 2011-04-27 MED ORDER — BENAZEPRIL HCL 40 MG PO TABS: 40.0000 mg | ORAL_TABLET | Freq: Every day | ORAL | Status: DC

## 2011-04-27 MED ORDER — OMEPRAZOLE 20 MG PO CPDR: 20.0000 mg | DELAYED_RELEASE_CAPSULE | Freq: Every day | ORAL | Status: DC

## 2011-04-27 MED ORDER — FUROSEMIDE 40 MG PO TABS: 40.0000 mg | ORAL_TABLET | Freq: Every day | ORAL | Status: DC

## 2011-04-27 NOTE — Progress Notes (Signed)
  Subjective:    Patient ID: Annette [name], female    DOB: 1930-06-21, 77 y.o.   MRN: [id number]  HPI  77 year old patient who is seen today for followup of hypertension. This has been well-controlled on furosemide and Lotensin. She has a history of primary hyperparathyroidism and her last calcium was 11.0. Since her last visit here she has had left cataract extraction surgery. She has had remote right cataract extraction surgery about 2 years ago. She feels well no concerns or complaints. She has a history of gastroesophageal reflux disease which has been stable; she has osteopenia  Review of Systems  Constitutional: Negative.   HENT: Negative for hearing loss, congestion, sore throat, rhinorrhea, dental problem, sinus pressure and tinnitus.   Eyes: Negative for pain, discharge and visual disturbance.  Respiratory: Negative for cough and shortness of breath.   Cardiovascular: Negative for chest pain, palpitations and leg swelling.  Gastrointestinal: Negative for nausea, vomiting, abdominal pain, diarrhea, constipation, blood in stool and abdominal distention.  Genitourinary: Negative for dysuria, urgency, frequency, hematuria, flank pain, vaginal bleeding, vaginal discharge, difficulty urinating, vaginal pain and pelvic pain.  Musculoskeletal: Negative for joint swelling, arthralgias and gait problem.  Skin: Negative for rash.  Neurological: Negative for dizziness, syncope, speech difficulty, weakness, numbness and headaches.  Hematological: Negative for adenopathy.  Psychiatric/Behavioral: Negative for behavioral problems, dysphoric mood and agitation. The patient is not nervous/anxious.        Objective:   Physical Exam  Constitutional: She is oriented to person, place, and time. She appears well-developed and well-nourished.  HENT:  Head: Normocephalic.  Right Ear: External ear normal.  Left Ear: External ear normal.  Mouth/Throat: Oropharynx is clear and moist.  Eyes: Conjunctivae  and EOM are normal. Pupils are equal, round, and reactive to light.  Neck: Normal range of motion. Neck supple. No thyromegaly present.  Cardiovascular: Normal rate, regular rhythm, normal heart sounds and intact distal pulses.   Pulmonary/Chest: Effort normal and breath sounds normal.  Abdominal: Soft. Bowel sounds are normal. She exhibits no mass. There is no tenderness.  Musculoskeletal: Normal range of motion.  Lymphadenopathy:    She has no cervical adenopathy.  Neurological: She is alert and oriented to person, place, and time.  Skin: Skin is warm and dry. No rash noted.  Psychiatric: She has a normal mood and affect. Her behavior is normal.          Assessment & Plan:   Hypertension. Well controlled we'll continue salt restricted diet attempt more exercise and weight loss Primary hyperparathyroidism. The patient wishes to avoid surgery if at all possible. We'll continue furosemide and second calcium level today. Osteopenia. Consider repeat bone density in view of her primary hyper parathyroidism

## 2011-04-27 NOTE — Patient Instructions (Signed)
It is important that you exercise regularly, at least 20 minutes 3 to 4 times per week.  If you develop chest pain or shortness of breath seek  medical attention.  You need to lose weight.  Consider a lower calorie diet and regular exercise.  Limit your sodium (Salt) intake  Take  [phone] units of vitamin D  Return in 4 months for follow-up

## 2011-08-15 ENCOUNTER — Telehealth: Payer: Self-pay | Admitting: *Deleted

## 2011-08-15 NOTE — Telephone Encounter (Addendum)
Pt took a double dose of Benazepril 40 mg.  Total of 80 mg.  Advised her to drink and eat and use some salt.  She feels a little dizzy, otherwise feels ok.  Does not have a BP cuff or a way to check her BP.

## 2011-08-16 NOTE — Telephone Encounter (Signed)
Kim and Dr. Kirtland Bouchard aware as soon as this call came in.

## 2011-08-27 ENCOUNTER — Ambulatory Visit (INDEPENDENT_AMBULATORY_CARE_PROVIDER_SITE_OTHER): Payer: Medicare Other | Admitting: Internal Medicine

## 2011-08-27 ENCOUNTER — Encounter: Payer: Self-pay | Admitting: Internal Medicine

## 2011-08-27 DIAGNOSIS — I10 Essential (primary) hypertension: Secondary | ICD-10-CM

## 2011-08-27 DIAGNOSIS — E210 Primary hyperparathyroidism: Secondary | ICD-10-CM

## 2011-08-27 MED ORDER — FUROSEMIDE 40 MG PO TABS: 40.0000 mg | ORAL_TABLET | Freq: Every day | ORAL | Status: DC

## 2011-08-27 MED ORDER — OMEPRAZOLE 20 MG PO CPDR: 20.0000 mg | DELAYED_RELEASE_CAPSULE | Freq: Every day | ORAL | Status: DC

## 2011-08-27 MED ORDER — BENAZEPRIL HCL 40 MG PO TABS: 40.0000 mg | ORAL_TABLET | Freq: Every day | ORAL | Status: DC

## 2011-08-27 NOTE — Patient Instructions (Signed)
Limit your sodium (Salt) intake    It is important that you exercise regularly, at least 20 minutes 3 to 4 times per week.  If you develop chest pain or shortness of breath seek  medical attention.  Please check your blood pressure on a regular basis.  If it is consistently greater than 150/90, please make an office appointment.  You need to lose weight.  Consider a lower calorie diet and regular exercise.  Return in 6 months for follow-up  

## 2011-08-27 NOTE — Progress Notes (Signed)
  Subjective:    Patient ID: Annette [name], female    DOB: 1930-10-06, 77 y.o.   MRN: [id number]  HPI  77 year old patient who is seen today for followup. She has a history of mild hypercalcemia secondary to primary hyperparathyroidism her last calcium level IV months ago was 10.9. She has treated hypertension on Lotensin as well as furosemide. She is followed by ophthalmology for glaucoma. She is in quite well today she does monitor home blood pressure readings with good blood pressure control    Review of Systems  Constitutional: Negative.   HENT: Negative for hearing loss, congestion, sore throat, rhinorrhea, dental problem, sinus pressure and tinnitus.   Eyes: Negative for pain, discharge and visual disturbance.  Respiratory: Negative for cough and shortness of breath.   Cardiovascular: Negative for chest pain, palpitations and leg swelling.  Gastrointestinal: Negative for nausea, vomiting, abdominal pain, diarrhea, constipation, blood in stool and abdominal distention.  Genitourinary: Negative for dysuria, urgency, frequency, hematuria, flank pain, vaginal bleeding, vaginal discharge, difficulty urinating, vaginal pain and pelvic pain.  Musculoskeletal: Negative for joint swelling, arthralgias and gait problem.  Skin: Negative for rash.  Neurological: Negative for dizziness, syncope, speech difficulty, weakness, numbness and headaches.  Hematological: Negative for adenopathy.  Psychiatric/Behavioral: Negative for behavioral problems, dysphoric mood and agitation. The patient is not nervous/anxious.        Objective:   Physical Exam  Constitutional: She is oriented to person, place, and time. She appears well-developed and well-nourished.       Blood pressure 140/84  HENT:  Head: Normocephalic.  Right Ear: External ear normal.  Left Ear: External ear normal.  Mouth/Throat: Oropharynx is clear and moist.  Eyes: Conjunctivae and EOM are normal. Pupils are equal, round, and  reactive to light.  Neck: Normal range of motion. Neck supple. No thyromegaly present.  Cardiovascular: Normal rate, regular rhythm, normal heart sounds and intact distal pulses.   Pulmonary/Chest: Effort normal and breath sounds normal.  Abdominal: Soft. Bowel sounds are normal. She exhibits no mass. There is no tenderness.  Musculoskeletal: Normal range of motion.  Lymphadenopathy:    She has no cervical adenopathy.  Neurological: She is alert and oriented to person, place, and time.  Skin: Skin is warm and dry. No rash noted.  Psychiatric: She has a normal mood and affect. Her behavior is normal.          Assessment & Plan:   Hypertension. Reasonable control. We'll continue present regimen weight loss low salt diet encouraged Hypercalcemia. We'll check a followup calcium  Recheck 6 months

## 2012-02-11 DIAGNOSIS — H43399 Other vitreous opacities, unspecified eye: Secondary | ICD-10-CM | POA: Diagnosis not present

## 2012-02-11 DIAGNOSIS — H4010X0 Unspecified open-angle glaucoma, stage unspecified: Secondary | ICD-10-CM | POA: Diagnosis not present

## 2012-02-11 DIAGNOSIS — H26499 Other secondary cataract, unspecified eye: Secondary | ICD-10-CM | POA: Diagnosis not present

## 2012-02-25 ENCOUNTER — Ambulatory Visit: Payer: BC Managed Care – PPO | Admitting: Internal Medicine

## 2012-03-18 ENCOUNTER — Ambulatory Visit (INDEPENDENT_AMBULATORY_CARE_PROVIDER_SITE_OTHER): Payer: Medicare Other | Admitting: Internal Medicine

## 2012-03-18 ENCOUNTER — Encounter: Payer: Self-pay | Admitting: Internal Medicine

## 2012-03-18 VITALS — BP 180/90 | Temp 98.6°F | Wt 169.0 lb

## 2012-03-18 DIAGNOSIS — K219 Gastro-esophageal reflux disease without esophagitis: Secondary | ICD-10-CM

## 2012-03-18 DIAGNOSIS — I10 Essential (primary) hypertension: Secondary | ICD-10-CM | POA: Diagnosis not present

## 2012-03-18 DIAGNOSIS — E210 Primary hyperparathyroidism: Secondary | ICD-10-CM | POA: Diagnosis not present

## 2012-03-18 NOTE — Progress Notes (Signed)
  Subjective:    Patient ID: Annette [name], female    DOB: January 08, 1930, 78 y.o.   MRN: [id number]  HPI 77 year old patient who has a history of treated hypertension as well as primary hyperparathyroidism. Over the weekend she developed some swollen right anterior cervical left glans that subsequent have resolved she has been cured for a son who has been ill with an acute febrile illness. She has been under much stress due to the poor health of several close friends. Today she feels improved   Review of Systems  Constitutional: Negative.   HENT: Negative for hearing loss, congestion, sore throat, rhinorrhea, dental problem, sinus pressure and tinnitus.   Eyes: Negative for pain, discharge and visual disturbance.  Respiratory: Negative for cough and shortness of breath.   Cardiovascular: Negative for chest pain, palpitations and leg swelling.  Gastrointestinal: Negative for nausea, vomiting, abdominal pain, diarrhea, constipation, blood in stool and abdominal distention.  Genitourinary: Negative for dysuria, urgency, frequency, hematuria, flank pain, vaginal bleeding, vaginal discharge, difficulty urinating, vaginal pain and pelvic pain.  Musculoskeletal: Negative for joint swelling, arthralgias and gait problem.  Skin: Negative for rash.  Neurological: Negative for dizziness, syncope, speech difficulty, weakness, numbness and headaches.  Hematological: Negative for adenopathy.  Psychiatric/Behavioral: Negative for behavioral problems, dysphoric mood and agitation. The patient is not nervous/anxious.        Objective:   Physical Exam  Constitutional: She is oriented to person, place, and time. She appears well-developed and well-nourished.  HENT:  Head: Normocephalic.  Right Ear: External ear normal.  Left Ear: External ear normal.  Mouth/Throat: Oropharynx is clear and moist.  Eyes: Conjunctivae and EOM are normal. Pupils are equal, round, and reactive to light.  Neck: Normal range of  motion. Neck supple. No thyromegaly present.  Cardiovascular: Normal rate, regular rhythm, normal heart sounds and intact distal pulses.   Pulmonary/Chest: Effort normal and breath sounds normal.  Abdominal: Soft. Bowel sounds are normal. She exhibits no mass. There is no tenderness.  Musculoskeletal: Normal range of motion.  Lymphadenopathy:    She has no cervical adenopathy.  Neurological: She is alert and oriented to person, place, and time.  Skin: Skin is warm and dry. No rash noted.  Psychiatric: She has a normal mood and affect. Her behavior is normal.          Assessment & Plan:   Viral URI Cervical adenopathy resolved Hypertension stable  Schedule CPX in 3 months

## 2012-03-18 NOTE — Patient Instructions (Addendum)
Limit your sodium (Salt) intake    It is important that you exercise regularly, at least 20 minutes 3 to 4 times per week.  If you develop chest pain or shortness of breath seek  medical attention.  Return in 3 months for follow-up  Call or return to clinic prn if these symptoms worsen or fail to improve as anticipated.  

## 2012-06-17 ENCOUNTER — Ambulatory Visit (INDEPENDENT_AMBULATORY_CARE_PROVIDER_SITE_OTHER): Payer: Medicare Other | Admitting: Internal Medicine

## 2012-06-17 ENCOUNTER — Encounter: Payer: Self-pay | Admitting: Internal Medicine

## 2012-06-17 VITALS — BP 128/80 | HR 72 | Temp 98.2°F | Resp 18 | Ht 62.5 in | Wt 166.0 lb

## 2012-06-17 DIAGNOSIS — E785 Hyperlipidemia, unspecified: Secondary | ICD-10-CM

## 2012-06-17 DIAGNOSIS — Z0000 Encounter for general adult medical examination without abnormal findings: Secondary | ICD-10-CM

## 2012-06-17 DIAGNOSIS — E210 Primary hyperparathyroidism: Secondary | ICD-10-CM

## 2012-06-17 DIAGNOSIS — K219 Gastro-esophageal reflux disease without esophagitis: Secondary | ICD-10-CM

## 2012-06-17 DIAGNOSIS — I10 Essential (primary) hypertension: Secondary | ICD-10-CM | POA: Diagnosis not present

## 2012-06-17 LAB — COMPREHENSIVE METABOLIC PANEL
ALT: 14 U/L (ref 0–35)
AST: 22 U/L (ref 0–37)
Albumin: 4 g/dL (ref 3.5–5.2)
Alkaline Phosphatase: 81 U/L (ref 39–117)
Calcium: 11 mg/dL — ABNORMAL HIGH (ref 8.4–10.5)
Chloride: 106 mEq/L (ref 96–112)
Potassium: 5.2 mEq/L — ABNORMAL HIGH (ref 3.5–5.1)
Sodium: 138 mEq/L (ref 135–145)
Total Protein: 6.7 g/dL (ref 6.0–8.3)

## 2012-06-17 LAB — CBC WITH DIFFERENTIAL/PLATELET
Basophils Absolute: 0 10*3/uL (ref 0.0–0.1)
Eosinophils Absolute: 0.1 10*3/uL (ref 0.0–0.7)
Lymphocytes Relative: 25.3 % (ref 12.0–46.0)
MCHC: 33.3 g/dL (ref 30.0–36.0)
MCV: 88.5 fl (ref 78.0–100.0)
Monocytes Absolute: 0.6 10*3/uL (ref 0.1–1.0)
Neutrophils Relative %: 62.5 % (ref 43.0–77.0)
RDW: 14 % (ref 11.5–14.6)

## 2012-06-17 LAB — LIPID PANEL
Total CHOL/HDL Ratio: 3
VLDL: 15.4 mg/dL (ref 0.0–40.0)

## 2012-06-17 LAB — TSH: TSH: 1.04 u[IU]/mL (ref 0.35–5.50)

## 2012-06-17 MED ORDER — BENAZEPRIL HCL 40 MG PO TABS: 40.0000 mg | ORAL_TABLET | Freq: Every day | ORAL | Status: DC

## 2012-06-17 MED ORDER — OMEPRAZOLE 20 MG PO CPDR: 20.0000 mg | DELAYED_RELEASE_CAPSULE | Freq: Every day | ORAL | Status: DC

## 2012-06-17 NOTE — Progress Notes (Signed)
Subjective:    Patient ID: Annette [name], female    DOB: December 20, 1929, 79 y.o.   MRN: [id number]  HPI  78 year old patient who is seen today for a wellness exam. Medical problems include hypertension. This has been well controlled on ACE inhibition. She has a history of glaucoma and is followed by ophthalmology. Additionally she has primary hyperparathyroidism. Calcium levels remain the acceptable. She does have a history of osteopenia but no history of renal stones. She remains asymptomatic. She has gastroesophageal reflux disease which has been controlled on omeprazole.  Past Medical History  Diagnosis Date  . GERD 10/03/2007  . HYPERTENSION 10/03/2007  . OSTEOPENIA 02/03/2008  . Primary hyperparathyroidism 10/03/2007    History   Social History  . Marital Status: Married    Spouse Name: N/A    Number of Children: N/A  . Years of Education: N/A   Occupational History  . Not on file.   Social History Main Topics  . Smoking status: Former Smoker    Quit date: 11/12/1978  . Smokeless tobacco: Never Used  . Alcohol Use: No  . Drug Use: No  . Sexually Active: Not on file   Other Topics Concern  . Not on file   Social History Narrative  . No narrative on file    Past Surgical History  Procedure Date  . Abdominal hysterectomy     No family history on file.  No Known Allergies  Current Outpatient Prescriptions on File Prior to Visit  Medication Sig Dispense Refill  . benazepril (LOTENSIN) 40 MG tablet Take 1 tablet (40 mg total) by mouth daily.  90 tablet  6  . furosemide (LASIX) 40 MG tablet Take 1 tablet (40 mg total) by mouth daily.  90 tablet  6  . latanoprost (XALATAN) 0.005 % ophthalmic solution Place 1 drop into both eyes at bedtime.       Marland Kitchen omeprazole (PRILOSEC) 20 MG capsule Take 1 capsule (20 mg total) by mouth daily.  90 capsule  6    BP 128/80  Pulse 72  Temp 98.2 F (36.8 C) (Oral)  Resp 18  Ht 5' 2.5" (1.588 m)  Wt 166 lb (75.297 kg)  BMI 29.88  kg/m2  SpO2 95%   1. Risk factors, based on past  M,S,F history-  cardiovascular risk factors include a history of hypertension.  2.  Physical activities: No activity restrictions.  3.  Depression/mood: No history depression or mood disorder  4.  Hearing: No deficits  5.  ADL's: Independent in all aspects of daily living  6.  Fall risk: Low  7.  Home safety: No problems identified  8.  Height weight, and visual acuity; height and weight stable no difficulty with visual acuity. Status post bilateral cataract surgery. Requires reading glasses only  9.  Counseling: Heart healthy diet regular exercise modest weight loss all encouraged  10. Lab orders based on risk factors: Laboratory profile will be reviewed calcium level reviewed  11. Referral : Not appropriate at this time  12. Care plan: Regular exercise weight loss encouraged  13. Cognitive assessment: Alert and oriented with normal affect. No cognitive dysfunction.       Review of Systems  Constitutional: Negative for fever, appetite change, fatigue and unexpected weight change.  HENT: Negative for hearing loss, ear pain, nosebleeds, congestion, sore throat, mouth sores, trouble swallowing, neck stiffness, dental problem, voice change, sinus pressure and tinnitus.   Eyes: Negative for photophobia, pain, redness and visual disturbance.  Respiratory: Negative  for cough, chest tightness and shortness of breath.   Cardiovascular: Negative for chest pain, palpitations and leg swelling.  Gastrointestinal: Negative for nausea, vomiting, abdominal pain, diarrhea, constipation, blood in stool, abdominal distention and rectal pain.  Genitourinary: Negative for dysuria, urgency, frequency, hematuria, flank pain, vaginal bleeding, vaginal discharge, difficulty urinating, genital sores, vaginal pain, menstrual problem and pelvic pain.  Musculoskeletal: Negative for back pain and arthralgias.  Skin: Negative for rash.  Neurological:  Negative for dizziness, syncope, speech difficulty, weakness, light-headedness, numbness and headaches.  Hematological: Negative for adenopathy. Does not bruise/bleed easily.  Psychiatric/Behavioral: Negative for suicidal ideas, behavioral problems, self-injury, dysphoric mood and agitation. The patient is not nervous/anxious.        Objective:   Physical Exam  Constitutional: She is oriented to person, place, and time. She appears well-developed and well-nourished.  HENT:  Head: Normocephalic and atraumatic.  Right Ear: External ear normal.  Left Ear: External ear normal.  Mouth/Throat: Oropharynx is clear and moist.  Eyes: Conjunctivae and EOM are normal.  Neck: Normal range of motion. Neck supple. No JVD present. No thyromegaly present.  Cardiovascular: Normal rate, regular rhythm, normal heart sounds and intact distal pulses.   No murmur heard. Pulmonary/Chest: Effort normal and breath sounds normal. She has no wheezes. She has no rales.  Abdominal: Soft. Bowel sounds are normal. She exhibits no distension and no mass. There is no tenderness. There is no rebound and no guarding.  Genitourinary: Vagina normal.  Musculoskeletal: Normal range of motion. She exhibits no edema and no tenderness.  Neurological: She is alert and oriented to person, place, and time. She has normal reflexes. No cranial nerve deficit. She exhibits normal muscle tone. Coordination normal.  Skin: Skin is warm and dry. No rash noted.  Psychiatric: She has a normal mood and affect. Her behavior is normal.          Assessment & Plan:   Preventive health examination Primary hyperparathyroidism. Will check a calcium level GERD stable Hypertension well controlled  Recheck 6 months

## 2012-06-17 NOTE — Progress Notes (Signed)
Quick Note:  Spoke with pt- informed labs normal ______ 

## 2012-06-17 NOTE — Patient Instructions (Signed)
Limit your sodium (Salt) intake  Please check your blood pressure on a regular basis.  If it is consistently greater than 150/90, please make an office appointment.    It is important that you exercise regularly, at least 20 minutes 3 to 4 times per week.  If you develop chest pain or shortness of breath seek  medical attention.  Avoids foods high in acid such as tomatoes citrus juices, and spicy foods.  Avoid eating within two hours of lying down or before exercising.  Do not overheat.  Try smaller more frequent meals.  If symptoms persist, elevate the head of her bed 12 inches while sleeping.  You need to lose weight.  Consider a lower calorie diet and regular exercise.  Return in 6 months for follow-up

## 2012-06-23 ENCOUNTER — Other Ambulatory Visit: Payer: Self-pay

## 2012-08-25 DIAGNOSIS — H4010X0 Unspecified open-angle glaucoma, stage unspecified: Secondary | ICD-10-CM | POA: Diagnosis not present

## 2012-08-25 DIAGNOSIS — H26499 Other secondary cataract, unspecified eye: Secondary | ICD-10-CM | POA: Diagnosis not present

## 2012-10-02 DIAGNOSIS — Z23 Encounter for immunization: Secondary | ICD-10-CM | POA: Diagnosis not present

## 2013-02-23 ENCOUNTER — Other Ambulatory Visit: Payer: Self-pay | Admitting: Internal Medicine

## 2013-03-03 DIAGNOSIS — H43399 Other vitreous opacities, unspecified eye: Secondary | ICD-10-CM | POA: Diagnosis not present

## 2013-03-03 DIAGNOSIS — H35039 Hypertensive retinopathy, unspecified eye: Secondary | ICD-10-CM | POA: Diagnosis not present

## 2013-03-03 DIAGNOSIS — H4010X0 Unspecified open-angle glaucoma, stage unspecified: Secondary | ICD-10-CM | POA: Diagnosis not present

## 2013-03-19 DIAGNOSIS — H43399 Other vitreous opacities, unspecified eye: Secondary | ICD-10-CM | POA: Diagnosis not present

## 2013-03-19 DIAGNOSIS — H4010X0 Unspecified open-angle glaucoma, stage unspecified: Secondary | ICD-10-CM | POA: Diagnosis not present

## 2013-03-19 DIAGNOSIS — D492 Neoplasm of unspecified behavior of bone, soft tissue, and skin: Secondary | ICD-10-CM | POA: Diagnosis not present

## 2013-03-19 DIAGNOSIS — H26499 Other secondary cataract, unspecified eye: Secondary | ICD-10-CM | POA: Diagnosis not present

## 2013-05-22 ENCOUNTER — Other Ambulatory Visit: Payer: Self-pay | Admitting: *Deleted

## 2013-05-22 MED ORDER — FUROSEMIDE 40 MG PO TABS: ORAL_TABLET | ORAL | Status: DC

## 2013-06-25 ENCOUNTER — Other Ambulatory Visit: Payer: Self-pay | Admitting: Internal Medicine

## 2013-06-30 ENCOUNTER — Encounter: Payer: BC Managed Care – PPO | Admitting: Internal Medicine

## 2013-07-30 ENCOUNTER — Ambulatory Visit (INDEPENDENT_AMBULATORY_CARE_PROVIDER_SITE_OTHER): Payer: Medicare Other | Admitting: Internal Medicine

## 2013-07-30 ENCOUNTER — Encounter: Payer: Self-pay | Admitting: Internal Medicine

## 2013-07-30 VITALS — BP 140/90 | HR 83 | Temp 97.9°F | Resp 20 | Wt 162.0 lb

## 2013-07-30 DIAGNOSIS — I10 Essential (primary) hypertension: Secondary | ICD-10-CM

## 2013-07-30 DIAGNOSIS — Z0000 Encounter for general adult medical examination without abnormal findings: Secondary | ICD-10-CM | POA: Diagnosis not present

## 2013-07-30 DIAGNOSIS — J069 Acute upper respiratory infection, unspecified: Secondary | ICD-10-CM

## 2013-07-30 LAB — CBC WITH DIFFERENTIAL/PLATELET
Basophils Absolute: 0 10*3/uL (ref 0.0–0.1)
Eosinophils Absolute: 0.1 10*3/uL (ref 0.0–0.7)
Lymphocytes Relative: 19.4 % (ref 12.0–46.0)
MCHC: 33.5 g/dL (ref 30.0–36.0)
MCV: 89.1 fl (ref 78.0–100.0)
Monocytes Absolute: 0.7 10*3/uL (ref 0.1–1.0)
Neutrophils Relative %: 67.3 % (ref 43.0–77.0)
Platelets: 243 10*3/uL (ref 150.0–400.0)
RDW: 14.2 % (ref 11.5–14.6)

## 2013-07-30 LAB — TSH: TSH: 0.76 u[IU]/mL (ref 0.35–5.50)

## 2013-07-30 LAB — COMPREHENSIVE METABOLIC PANEL
ALT: 16 U/L (ref 0–35)
AST: 18 U/L (ref 0–37)
Albumin: 4 g/dL (ref 3.5–5.2)
BUN: 14 mg/dL (ref 6–23)
Calcium: 11 mg/dL — ABNORMAL HIGH (ref 8.4–10.5)
Chloride: 104 mEq/L (ref 96–112)
Potassium: 4.9 mEq/L (ref 3.5–5.1)
Sodium: 138 mEq/L (ref 135–145)
Total Protein: 6.6 g/dL (ref 6.0–8.3)

## 2013-07-30 LAB — LIPID PANEL
Cholesterol: 200 mg/dL (ref 0–200)
LDL Cholesterol: 117 mg/dL — ABNORMAL HIGH (ref 0–99)
VLDL: 13 mg/dL (ref 0.0–40.0)

## 2013-07-30 NOTE — Progress Notes (Signed)
Subjective:    Patient ID: Annette [name], female    DOB: [birth year], 79 y.o.   MRN: [id number]  HPI  79 year old patient who has treated hypertension. She has been under considerable situational stress due to to the recent poor health and death of her husband. She had been spending considerable time in the hospital during his acute illness. She has developed cough sore throat and chest congestion. She has been expectorating small amount of yellow mucus. There's been no fever chills chest pain or shortness of breath. She states that she has been sleeping well.  Past Medical History  Diagnosis Date  . GERD 10/03/2007  . HYPERTENSION 10/03/2007  . OSTEOPENIA 02/03/2008  . Primary hyperparathyroidism 10/03/2007    History   Social History  . Marital Status: Married    Spouse Name: N/A    Number of Children: N/A  . Years of Education: N/A   Occupational History  . Not on file.   Social History Main Topics  . Smoking status: Former Smoker    Quit date: 11/12/1978  . Smokeless tobacco: Never Used  . Alcohol Use: No  . Drug Use: No  . Sexual Activity: Not on file   Other Topics Concern  . Not on file   Social History Narrative  . No narrative on file    Past Surgical History  Procedure Laterality Date  . Abdominal hysterectomy      No family history on file.  No Known Allergies  Current Outpatient Prescriptions on File Prior to Visit  Medication Sig Dispense Refill  . benazepril (LOTENSIN) 40 MG tablet Take 1 tablet (40 mg total) by mouth daily.  90 tablet  6  . furosemide (LASIX) 40 MG tablet TAKE ONE TABLET BY MOUTH DAILY.  90 tablet  0  . latanoprost (XALATAN) 0.005 % ophthalmic solution Place 1 drop into both eyes at bedtime.       Marland Kitchen omeprazole (PRILOSEC) 20 MG capsule TAKE ONE CAPSULE BY MOUTH DAILY  90 capsule  0   No current facility-administered medications on file prior to visit.    BP 140/90  Pulse 83  Temp(Src) 97.9 F (36.6 C) (Oral)  Resp 20  Wt  162 lb (73.483 kg)  BMI 29.14 kg/m2  SpO2 95%       Review of Systems  Constitutional: Positive for activity change and fatigue.  HENT: Positive for congestion and sore throat. Negative for hearing loss, rhinorrhea, dental problem, sinus pressure and tinnitus.   Eyes: Negative for pain, discharge and visual disturbance.  Respiratory: Positive for cough. Negative for shortness of breath.   Cardiovascular: Negative for chest pain, palpitations and leg swelling.  Gastrointestinal: Negative for nausea, vomiting, abdominal pain, diarrhea, constipation, blood in stool and abdominal distention.  Genitourinary: Negative for dysuria, urgency, frequency, hematuria, flank pain, vaginal bleeding, vaginal discharge, difficulty urinating, vaginal pain and pelvic pain.  Musculoskeletal: Negative for joint swelling, arthralgias and gait problem.  Skin: Negative for rash.  Neurological: Negative for dizziness, syncope, speech difficulty, weakness, numbness and headaches.  Hematological: Negative for adenopathy.  Psychiatric/Behavioral: Negative for behavioral problems, dysphoric mood and agitation. The patient is not nervous/anxious.        Objective:   Physical Exam  Constitutional: She is oriented to person, place, and time. She appears well-developed and well-nourished.  Appears tired but in no acute distress Afebrile Repeat blood pressure 138/80  HENT:  Head: Normocephalic.  Right Ear: External ear normal.  Left Ear: External ear normal.  Mouth/Throat: Oropharynx  is clear and moist.  Eyes: Conjunctivae and EOM are normal. Pupils are equal, round, and reactive to light.  Neck: Normal range of motion. Neck supple. No thyromegaly present.  Cardiovascular: Normal rate, regular rhythm, normal heart sounds and intact distal pulses.   Pulmonary/Chest: Effort normal and breath sounds normal.  Abdominal: Soft. Bowel sounds are normal. She exhibits no mass. There is no tenderness.  Musculoskeletal:  Normal range of motion.  Lymphadenopathy:    She has no cervical adenopathy.  Neurological: She is alert and oriented to person, place, and time.  Skin: Skin is warm and dry. No rash noted.  Psychiatric: She has a normal mood and affect. Her behavior is normal.          Assessment & Plan:   Viral URI with cough Situational stress Hypertension. Reasonable control. Patient is scheduled for a physical later this fall we'll reassess at that time  [facility] treat symptomatically

## 2013-07-30 NOTE — Patient Instructions (Signed)
Acute bronchitis symptoms are generally not helped by antibiotics.  Take over-the-counter expectorants and cough medications such as  Mucinex DM.  Call if there is no improvement in 5 to 7 days or if he developed worsening cough, fever, or new symptoms, such as shortness of breath or chest pain. 

## 2013-07-31 ENCOUNTER — Telehealth: Payer: Self-pay | Admitting: Internal Medicine

## 2013-07-31 NOTE — Telephone Encounter (Signed)
Spoke to pt told her it says former smoker in her chart. Pt verbalized understanding.

## 2013-07-31 NOTE — Telephone Encounter (Signed)
Pt states her print out from visit on 9/17 stated she is a smoker. Pt is NOT a smoker and would like that changed in her records.

## 2013-08-10 ENCOUNTER — Encounter: Payer: Self-pay | Admitting: Internal Medicine

## 2013-08-10 ENCOUNTER — Ambulatory Visit (INDEPENDENT_AMBULATORY_CARE_PROVIDER_SITE_OTHER): Payer: Medicare Other | Admitting: Internal Medicine

## 2013-08-10 VITALS — BP 160/90 | HR 68 | Temp 97.9°F | Resp 20 | Wt 165.0 lb

## 2013-08-10 DIAGNOSIS — Z0000 Encounter for general adult medical examination without abnormal findings: Secondary | ICD-10-CM

## 2013-08-10 DIAGNOSIS — Z23 Encounter for immunization: Secondary | ICD-10-CM | POA: Diagnosis not present

## 2013-08-10 DIAGNOSIS — E210 Primary hyperparathyroidism: Secondary | ICD-10-CM | POA: Diagnosis not present

## 2013-08-10 DIAGNOSIS — K219 Gastro-esophageal reflux disease without esophagitis: Secondary | ICD-10-CM

## 2013-08-10 DIAGNOSIS — I10 Essential (primary) hypertension: Secondary | ICD-10-CM | POA: Diagnosis not present

## 2013-08-10 MED ORDER — FUROSEMIDE 40 MG PO TABS: ORAL_TABLET | ORAL | Status: DC

## 2013-08-10 MED ORDER — OMEPRAZOLE 20 MG PO CPDR: DELAYED_RELEASE_CAPSULE | ORAL | Status: DC

## 2013-08-10 MED ORDER — BENAZEPRIL HCL 40 MG PO TABS: 40.0000 mg | ORAL_TABLET | Freq: Every day | ORAL | Status: DC

## 2013-08-10 NOTE — Progress Notes (Signed)
Patient ID: Annette [name], female   DOB: 18-Jul-1930, 79 y.o.   MRN: [id number]  Subjective:    Patient ID: Annette [name], female    DOB: 13-Oct-1930, 79 y.o.   MRN: [id number]  HPI  79 -year-old patient who is seen today for a wellness exam. Medical problems include hypertension. This has been well controlled on ACE inhibition. She has a history of glaucoma and is followed by ophthalmology. Additionally she has primary hyperparathyroidism. Calcium levels remain acceptable. She does have a history of osteopenia but no history of renal stones. She remains asymptomatic. She has gastroesophageal reflux disease which has been controlled on omeprazole. She has done well over the past year but adjusting to the recent death of her husband  Past Medical History  Diagnosis Date  . GERD 10/03/2007  . HYPERTENSION 10/03/2007  . OSTEOPENIA 02/03/2008  . Primary hyperparathyroidism 10/03/2007    History   Social History  . Marital Status: Married    Spouse Name: N/A    Number of Children: N/A  . Years of Education: N/A   Occupational History  . Not on file.   Social History Main Topics  . Smoking status: Former Smoker    Quit date: 11/12/1978  . Smokeless tobacco: Never Used  . Alcohol Use: No  . Drug Use: No  . Sexual Activity: Not on file   Other Topics Concern  . Not on file   Social History Narrative  . No narrative on file    Past Surgical History  Procedure Laterality Date  . Abdominal hysterectomy      No family history on file.  No Known Allergies  Current Outpatient Prescriptions on File Prior to Visit  Medication Sig Dispense Refill  . latanoprost (XALATAN) 0.005 % ophthalmic solution Place 1 drop into both eyes at bedtime.        No current facility-administered medications on file prior to visit.    BP 160/90  Pulse 68  Temp(Src) 97.9 F (36.6 C) (Oral)  Resp 20  Wt 165 lb (74.844 kg)  BMI 29.68 kg/m2  SpO2 95%   1. Risk factors, based on past  M,S,F  history-  cardiovascular risk factors include a history of hypertension.  2.  Physical activities: No activity restrictions.  3.  Depression/mood: No history depression or mood disorder  4.  Hearing: No deficits  5.  ADL's: Independent in all aspects of daily living  6.  Fall risk: Low  7.  Home safety: No problems identified  8.  Height weight, and visual acuity; height and weight stable no difficulty with visual acuity. Status post bilateral cataract surgery. Requires reading glasses only  9.  Counseling: Heart healthy diet regular exercise modest weight loss all encouraged  10. Lab orders based on risk factors: Laboratory profile will be reviewed calcium level reviewed  11. Referral : Not appropriate at this time  12. Care plan: Regular exercise weight loss encouraged  13. Cognitive assessment: Alert and oriented with normal affect. No cognitive dysfunction.       Review of Systems  Constitutional: Negative for fever, appetite change, fatigue and unexpected weight change.  HENT: Negative for hearing loss, ear pain, nosebleeds, congestion, sore throat, mouth sores, trouble swallowing, neck stiffness, dental problem, voice change, sinus pressure and tinnitus.   Eyes: Negative for photophobia, pain, redness and visual disturbance.  Respiratory: Negative for cough, chest tightness and shortness of breath.   Cardiovascular: Negative for chest pain, palpitations and leg swelling.  Gastrointestinal: Negative  for nausea, vomiting, abdominal pain, diarrhea, constipation, blood in stool, abdominal distention and rectal pain.  Genitourinary: Negative for dysuria, urgency, frequency, hematuria, flank pain, vaginal bleeding, vaginal discharge, difficulty urinating, genital sores, vaginal pain, menstrual problem and pelvic pain.  Musculoskeletal: Negative for back pain and arthralgias.  Skin: Negative for rash.  Neurological: Negative for dizziness, syncope, speech difficulty, weakness,  light-headedness, numbness and headaches.  Hematological: Negative for adenopathy. Does not bruise/bleed easily.  Psychiatric/Behavioral: Negative for suicidal ideas, behavioral problems, self-injury, dysphoric mood and agitation. The patient is not nervous/anxious.        Objective:   Physical Exam  Constitutional: She is oriented to person, place, and time. She appears well-developed and well-nourished.  HENT:  Head: Normocephalic and atraumatic.  Right Ear: External ear normal.  Left Ear: External ear normal.  Mouth/Throat: Oropharynx is clear and moist.  Eyes: Conjunctivae and EOM are normal.  Neck: Normal range of motion. Neck supple. No JVD present. No thyromegaly present.  Cardiovascular: Normal rate, regular rhythm, normal heart sounds and intact distal pulses.   No murmur heard. Pulmonary/Chest: Effort normal and breath sounds normal. She has no wheezes. She has no rales.  Abdominal: Soft. Bowel sounds are normal. She exhibits no distension and no mass. There is no tenderness. There is no rebound and no guarding.  Genitourinary: Vagina normal.  Musculoskeletal: Normal range of motion. She exhibits no edema and no tenderness.  Neurological: She is alert and oriented to person, place, and time. She has normal reflexes. No cranial nerve deficit. She exhibits normal muscle tone. Coordination normal.  Skin: Skin is warm and dry. No rash noted.  Psychiatric: She has a normal mood and affect. Her behavior is normal.          Assessment & Plan:   Preventive health examination Primary hyperparathyroidism. Calcium level is stable GERD stable Hypertension well controlled  Recheck 6-12 months

## 2013-08-10 NOTE — Patient Instructions (Signed)
Limit your sodium (Salt) intake    It is important that you exercise regularly, at least 20 minutes 3 to 4 times per week.  If you develop chest pain or shortness of breath seek  medical attention.  Please check your blood pressure on a regular basis.  If it is consistently greater than 150/90, please make an office appointment.  Return in 6 months for follow-up   

## 2013-09-15 DIAGNOSIS — H4011X0 Primary open-angle glaucoma, stage unspecified: Secondary | ICD-10-CM | POA: Diagnosis not present

## 2013-12-15 ENCOUNTER — Ambulatory Visit (INDEPENDENT_AMBULATORY_CARE_PROVIDER_SITE_OTHER): Payer: Medicare Other | Admitting: Internal Medicine

## 2013-12-15 ENCOUNTER — Encounter: Payer: Self-pay | Admitting: Internal Medicine

## 2013-12-15 VITALS — BP 160/90 | HR 89 | Temp 98.3°F | Resp 20 | Ht 62.5 in | Wt 171.0 lb

## 2013-12-15 DIAGNOSIS — I10 Essential (primary) hypertension: Secondary | ICD-10-CM | POA: Diagnosis not present

## 2013-12-15 DIAGNOSIS — E210 Primary hyperparathyroidism: Secondary | ICD-10-CM

## 2013-12-15 MED ORDER — AMLODIPINE BESYLATE 2.5 MG PO TABS: 2.5000 mg | ORAL_TABLET | Freq: Every day | ORAL | Status: DC

## 2013-12-15 NOTE — Progress Notes (Signed)
Pre-visit discussion using our clinic review tool. No additional management support is needed unless otherwise documented below in the visit note.  

## 2013-12-15 NOTE — Progress Notes (Signed)
Subjective:    Patient ID: Annette [name], female    DOB: 1930-04-29, 80 y.o.   MRN: [id number]  HPI  80 year old patient who has treated hypertension. She also has a history of primary hyperparathyroidism with a stable mildly elevated serum calcium level. The past few days she has had some cough congestion postnasal drip and mild sore throat. She has been using symptomatic therapy and seems to be improving. There's been no fever. Cough is nonproductive. She continues to have some mild chest congestion. Antihypertensive therapy has included furosemide that has been on hold for several days.  BP Readings from Last 3 Encounters:  12/15/13 160/90  08/10/13 160/90  07/30/13 140/90    Past Medical History  Diagnosis Date  . GERD 10/03/2007  . HYPERTENSION 10/03/2007  . OSTEOPENIA 02/03/2008  . Primary hyperparathyroidism 10/03/2007    History   Social History  . Marital Status: Married    Spouse Name: N/A    Number of Children: N/A  . Years of Education: N/A   Occupational History  . Not on file.   Social History Main Topics  . Smoking status: Former Smoker    Quit date: 11/12/1978  . Smokeless tobacco: Never Used  . Alcohol Use: No  . Drug Use: No  . Sexual Activity: Not on file   Other Topics Concern  . Not on file   Social History Narrative  . No narrative on file    Past Surgical History  Procedure Laterality Date  . Abdominal hysterectomy      No family history on file.  No Known Allergies  Current Outpatient Prescriptions on File Prior to Visit  Medication Sig Dispense Refill  . benazepril (LOTENSIN) 40 MG tablet Take 1 tablet (40 mg total) by mouth daily.  90 tablet  3  . latanoprost (XALATAN) 0.005 % ophthalmic solution Place 1 drop into both eyes at bedtime.       Marland Kitchen omeprazole (PRILOSEC) 20 MG capsule TAKE ONE CAPSULE BY MOUTH DAILY  90 capsule  3  . furosemide (LASIX) 40 MG tablet TAKE ONE TABLET BY MOUTH DAILY.  90 tablet  3   No current  facility-administered medications on file prior to visit.    BP 160/90  Pulse 89  Temp(Src) 98.3 F (36.8 C) (Oral)  Resp 20  Ht 5' 2.5" (1.588 m)  Wt 171 lb (77.565 kg)  BMI 30.76 kg/m2  SpO2 96%       Review of Systems  Constitutional: Positive for fatigue.  HENT: Positive for postnasal drip. Negative for congestion, dental problem, hearing loss, rhinorrhea, sinus pressure, sore throat and tinnitus.   Eyes: Negative for pain, discharge and visual disturbance.  Respiratory: Positive for cough. Negative for shortness of breath and wheezing.   Cardiovascular: Negative for chest pain, palpitations and leg swelling.  Gastrointestinal: Negative for nausea, vomiting, abdominal pain, diarrhea, constipation, blood in stool and abdominal distention.  Genitourinary: Negative for dysuria, urgency, frequency, hematuria, flank pain, vaginal bleeding, vaginal discharge, difficulty urinating, vaginal pain and pelvic pain.  Musculoskeletal: Negative for arthralgias, gait problem and joint swelling.  Skin: Negative for rash.  Neurological: Negative for dizziness, syncope, speech difficulty, weakness, numbness and headaches.  Hematological: Negative for adenopathy.  Psychiatric/Behavioral: Negative for behavioral problems, dysphoric mood and agitation. The patient is not nervous/anxious.        Objective:   Physical Exam  Constitutional: She is oriented to person, place, and time. She appears well-developed and well-nourished.  Blood pressure 140-150 over 90-  HENT:  Head: Normocephalic.  Right Ear: External ear normal.  Left Ear: External ear normal.  Mouth/Throat: Oropharynx is clear and moist.  Eyes: Conjunctivae and EOM are normal. Pupils are equal, round, and reactive to light.  Neck: Normal range of motion. Neck supple. No thyromegaly present.  Cardiovascular: Normal rate, regular rhythm, normal heart sounds and intact distal pulses.   Pulmonary/Chest: Effort normal and breath  sounds normal.  Rare scattered rhonchi  Abdominal: Soft. Bowel sounds are normal. She exhibits no mass. There is no tenderness.  Musculoskeletal: Normal range of motion.  Lymphadenopathy:    She has no cervical adenopathy.  Neurological: She is alert and oriented to person, place, and time.  Skin: Skin is warm and dry. No rash noted.  Psychiatric: She has a normal mood and affect. Her behavior is normal.          Assessment & Plan:   Viral URI with cough Hypertension. Suboptimal control. We'll add the amlodipine 2.5 mg daily History of hyper parathyroidism  We'll continue symptomatic treatment

## 2013-12-15 NOTE — Patient Instructions (Signed)
Limit your sodium (Salt) intake  Please check your blood pressure on a regular basis.  If it is consistently greater than 150/90, please make an office appointment.    It is important that you exercise regularly, at least 20 minutes 3 to 4 times per week.  If you develop chest pain or shortness of breath seek  medical attention.  Acute bronchitis symptoms for less than 10 days are generally not helped by antibiotics.  Take over-the-counter expectorants and cough medications such as  Mucinex DM.  Call if there is no improvement in 5 to 7 days or if he developed worsening cough, fever, or new symptoms, such as shortness of breath or chest pain.

## 2013-12-16 ENCOUNTER — Telehealth: Payer: Self-pay | Admitting: Internal Medicine

## 2013-12-16 NOTE — Telephone Encounter (Signed)
Relevant patient education mailed to patient.  

## 2014-01-19 ENCOUNTER — Telehealth: Payer: Self-pay | Admitting: Internal Medicine

## 2014-01-19 NOTE — Telephone Encounter (Signed)
Patient Information:  Caller Name: Darion  Phone: [phone]  Patient: Annette [name], Annette [name]  Gender: Female  DOB: [birth year]  Age: 80 Years  PCP: Bluford Kaufmann (Family Practice > 47yrs old)  Office Follow Up:  Does the office need to follow up with this patient?: No  Instructions For The Office: N/A  RN Note:  States had viral bronchitis in February 2015 and seemed to improve, but onset of "return of cough and wheezing" 01/12/14.  States has also been treated for hypertension, and felt more tired than usual.  States BP is now 110/63.  States she has new onset mild wheezing.  Has no inhaler.  Per cough protocol, disposition see within 4 hours due to presence of wheezing.  Advised UC as office has no appts available today.  Patient refuses to go to [facility]; insists on appt in AM 01/20/14.  Appointment scheduled per patient insistence 01/20/14 0945 with Dr. Elease Hashimoto.  krs/can  Symptoms  Reason For Call & Symptoms: treated for bronchitis in February 2015  Reviewed Health History In EMR: Yes  Reviewed Medications In EMR: Yes  Reviewed Allergies In EMR: Yes  Reviewed Surgeries / Procedures: Yes  Date of Onset of Symptoms: 12/15/2013  Guideline(s) Used:  Cough  Disposition Per Guideline:   Go to Office Now  Reason For Disposition Reached:   Wheezing is present  Advice Given:  N/A  Patient Will Follow Care Advice:  YES  Appointment Scheduled:  01/20/2014 09:45:00 Appointment Scheduled Provider:  Carolann Littler (Family Practice)

## 2014-01-19 NOTE — Telephone Encounter (Signed)
Noted  

## 2014-01-20 ENCOUNTER — Encounter: Payer: Self-pay | Admitting: Family Medicine

## 2014-01-20 ENCOUNTER — Ambulatory Visit (INDEPENDENT_AMBULATORY_CARE_PROVIDER_SITE_OTHER): Payer: Medicare Other | Admitting: Family Medicine

## 2014-01-20 VITALS — BP 130/80 | HR 91 | Temp 98.3°F | Wt 174.0 lb

## 2014-01-20 DIAGNOSIS — R062 Wheezing: Secondary | ICD-10-CM | POA: Diagnosis not present

## 2014-01-20 MED ORDER — ALBUTEROL SULFATE HFA 108 (90 BASE) MCG/ACT IN AERS: 2.0000 | INHALATION_SPRAY | Freq: Four times a day (QID) | RESPIRATORY_TRACT | Status: DC | PRN

## 2014-01-20 MED ORDER — METHYLPREDNISOLONE ACETATE 80 MG/ML IJ SUSP
80.0000 mg | Freq: Once | INTRAMUSCULAR | Status: AC
  Administered 2014-01-20: 80 mg via INTRAMUSCULAR

## 2014-01-20 NOTE — Progress Notes (Signed)
   Subjective:    Patient ID: Annette [name], female    DOB: [birth year], 80 y.o.   MRN: [id number]  HPI Patient seen for acute illness. Back in February she had somewhat similar illness but didn't fully recover. She presents now with one week history of mostly dry cough. Occasionally productive of clear sputum. She had some mild wheezing but no significant dyspnea. She is using Mucinex DM with some relief of cough. Nonsmoker. No fevers or chills. No history of asthma. No active GERD symptoms.  Past Medical History  Diagnosis Date  . GERD 10/03/2007  . HYPERTENSION 10/03/2007  . OSTEOPENIA 02/03/2008  . Primary hyperparathyroidism 10/03/2007   Past Surgical History  Procedure Laterality Date  . Abdominal hysterectomy      reports that she quit smoking about 35 years ago. She has never used smokeless tobacco. She reports that she does not drink alcohol or use illicit drugs. family history is not on file. No Known Allergies    Review of Systems  Constitutional: Negative for fever and chills.  HENT: Negative for congestion.   Respiratory: Positive for cough and wheezing. Negative for shortness of breath.   Cardiovascular: Negative for chest pain and leg swelling.  Neurological: Negative for dizziness.       Objective:   Physical Exam  Constitutional: She appears well-developed and well-nourished.  HENT:  Right Ear: External ear normal.  Left Ear: External ear normal.  Mouth/Throat: Oropharynx is clear and moist.  Cardiovascular: Normal rate.   Pulmonary/Chest: Effort normal.  Patient has some diffuse wheezes. No rales. No retractions. Normal respiratory rate.  Musculoskeletal: She exhibits no edema.          Assessment & Plan:  Cough with bronchospasm. No history of asthma. Suspect triggered by viral illness. Continue Mucinex DM. Depo-Medrol 80 mg IM given. Albuterol inhaler for as needed use. Followup promptly for fever or worsening symptoms

## 2014-01-20 NOTE — Patient Instructions (Signed)
Bronchospasm, Adult °A bronchospasm is when the tubes that carry air in and out of your lungs (airwarys) spasm or tighten. During a bronchospasm it is hard to breathe. This is because the airways get smaller. A bronchospasm can be triggered by: °· Allergies. These may be to animals, pollen, food, or mold. °· Infection. This is a common cause of bronchospasm. °· Exercise. °· Irritants. These include pollution, cigarette smoke, strong odors, aerosol sprays, and paint fumes. °· Weather changes. °· Stress. °· Being emotional. °HOME CARE  °· Always have a plan for getting help. Know when to call your doctor and local emergency services (911 in the [place] Know where you can get emergency care. °· Only take medicines as told by your doctor. °· If you were prescribed an inhaler or nebulizer machine, ask your doctor how to use it correctly. Always use a spacer with your inhaler if you were given one. °· Stay calm during an attack. Try to relax and breathe more slowly. °· Control your home environment: °· Change your heating and air conditioning filter at least once a month. °· Limit your use of fireplaces and wood stoves. °· Do not  smoke. Do not  allow smoking in your home. °· Avoid perfumes and fragrances. °· Get rid of pests (such as roaches and mice) and their droppings. °· Throw away plants if you see mold on them. °· Keep your house clean and dust free. °· Replace carpet with wood, tile, or vinyl flooring. Carpet can trap dander and dust. °· Use allergy-proof pillows, mattress covers, and box spring covers. °· Wash bed sheets and blankets every week in hot water. Dry them in a dryer. °· Use blankets that are made of polyester or cotton. °· Wash hands frequently. °GET HELP IF: °· You have muscle aches. °· You have chest pain. °· The thick spit you spit or cough up (sputum) changes from clear or white to yellow, green, gray, or bloody. °· The thick spit you spit or cough up gets thicker. °· There are problems that may be  related to the medicine you are given such as: °· A rash. °· Itching. °· Swelling. °· Trouble breathing. °GET HELP RIGHT AWAY IF: °· You feel you cannot breathe or catch your breath. °· You cannot stop coughing. °· Your treatment is not helping you breathe better. °MAKE SURE YOU:  °· Understand these instructions. °· Will watch your condition. °· Will get help right away if you are not doing well or get worse. °Document Released: 08/26/2009 Document Revised: 07/01/2013 Document Reviewed: 04/21/2013 °[employer]® Patient Information ©2014 [employer]. ° °

## 2014-01-20 NOTE — Progress Notes (Signed)
Pre visit review using our clinic review tool, if applicable. No additional management support is needed unless otherwise documented below in the visit note. 

## 2014-01-28 ENCOUNTER — Ambulatory Visit: Payer: Medicare Other | Admitting: Internal Medicine

## 2014-02-08 ENCOUNTER — Encounter: Payer: Self-pay | Admitting: Internal Medicine

## 2014-02-08 ENCOUNTER — Ambulatory Visit (INDEPENDENT_AMBULATORY_CARE_PROVIDER_SITE_OTHER): Payer: Medicare Other | Admitting: Internal Medicine

## 2014-02-08 VITALS — BP 144/80 | HR 66 | Temp 98.4°F | Resp 20 | Ht 62.5 in | Wt 176.0 lb

## 2014-02-08 DIAGNOSIS — K219 Gastro-esophageal reflux disease without esophagitis: Secondary | ICD-10-CM | POA: Diagnosis not present

## 2014-02-08 DIAGNOSIS — I10 Essential (primary) hypertension: Secondary | ICD-10-CM | POA: Diagnosis not present

## 2014-02-08 NOTE — Progress Notes (Signed)
Subjective:    Patient ID: Annette [name], female    DOB: 1930-02-22, 80 y.o.   MRN: [id number]  HPI   80 year old patient who is seen today for followup.  She is treated hypertension.  She also has a history of gastro-esophageal reflux disease.  No concerns or complaints today. Mild resolving URI symptoms.  Past Medical History  Diagnosis Date  . GERD 10/03/2007  . HYPERTENSION 10/03/2007  . OSTEOPENIA 02/03/2008  . Primary hyperparathyroidism 10/03/2007    History   Social History  . Marital Status: Married    Spouse Name: N/A    Number of Children: N/A  . Years of Education: N/A   Occupational History  . Not on file.   Social History Main Topics  . Smoking status: Former Smoker    Quit date: 11/12/1978  . Smokeless tobacco: Never Used  . Alcohol Use: No  . Drug Use: No  . Sexual Activity: Not on file   Other Topics Concern  . Not on file   Social History Narrative  . No narrative on file    Past Surgical History  Procedure Laterality Date  . Abdominal hysterectomy      No family history on file.  No Known Allergies  Current Outpatient Prescriptions on File Prior to Visit  Medication Sig Dispense Refill  . amLODipine (NORVASC) 2.5 MG tablet Take 1 tablet (2.5 mg total) by mouth daily.  90 tablet  3  . benazepril (LOTENSIN) 40 MG tablet Take 1 tablet (40 mg total) by mouth daily.  90 tablet  3  . furosemide (LASIX) 40 MG tablet TAKE ONE TABLET BY MOUTH DAILY.  90 tablet  3  . latanoprost (XALATAN) 0.005 % ophthalmic solution Place 1 drop into both eyes at bedtime.       Marland Kitchen omeprazole (PRILOSEC) 20 MG capsule TAKE ONE CAPSULE BY MOUTH DAILY  90 capsule  3  . albuterol (PROVENTIL HFA;VENTOLIN HFA) 108 (90 BASE) MCG/ACT inhaler Inhale 2 puffs into the lungs every 6 (six) hours as needed for wheezing or shortness of breath.  1 Inhaler  0   No current facility-administered medications on file prior to visit.    BP 144/80  Pulse 66  Temp(Src) 98.4 F (36.9  C) (Oral)  Resp 20  Ht 5' 2.5" (1.588 m)  Wt 176 lb (79.833 kg)  BMI 31.66 kg/m2  SpO2 95%       Review of Systems  Constitutional: Negative.   HENT: Negative for congestion, dental problem, hearing loss, rhinorrhea, sinus pressure, sore throat and tinnitus.   Eyes: Negative for pain, discharge and visual disturbance.  Respiratory: Negative for cough and shortness of breath.   Cardiovascular: Negative for chest pain, palpitations and leg swelling.  Gastrointestinal: Negative for nausea, vomiting, abdominal pain, diarrhea, constipation, blood in stool and abdominal distention.  Genitourinary: Negative for dysuria, urgency, frequency, hematuria, flank pain, vaginal bleeding, vaginal discharge, difficulty urinating, vaginal pain and pelvic pain.  Musculoskeletal: Negative for arthralgias, gait problem and joint swelling.  Skin: Negative for rash.  Neurological: Negative for dizziness, syncope, speech difficulty, weakness, numbness and headaches.  Hematological: Negative for adenopathy.  Psychiatric/Behavioral: Negative for behavioral problems, dysphoric mood and agitation. The patient is not nervous/anxious.        Objective:   Physical Exam  Constitutional: She is oriented to person, place, and time. She appears well-developed and well-nourished.  HENT:  Head: Normocephalic.  Right Ear: External ear normal.  Left Ear: External ear normal.  Mouth/Throat: Oropharynx  is clear and moist.  Eyes: Conjunctivae and EOM are normal. Pupils are equal, round, and reactive to light.  Neck: Normal range of motion. Neck supple. No thyromegaly present.  Cardiovascular: Normal rate, regular rhythm, normal heart sounds and intact distal pulses.   Pulmonary/Chest: Effort normal and breath sounds normal.  Few crackles, right base  Abdominal: Soft. Bowel sounds are normal. She exhibits no mass. There is no tenderness.  Musculoskeletal: Normal range of motion.  Lymphadenopathy:    She has no  cervical adenopathy.  Neurological: She is alert and oriented to person, place, and time.  Skin: Skin is warm and dry. No rash noted.  Psychiatric: She has a normal mood and affect. Her behavior is normal.          Assessment & Plan:   Hypertension stable Resolving URI Gastroesophageal reflux disease  CPX 6 months

## 2014-02-08 NOTE — Progress Notes (Signed)
Pre-visit discussion using our clinic review tool. No additional management support is needed unless otherwise documented below in the visit note.  

## 2014-02-08 NOTE — Patient Instructions (Signed)
Limit your sodium (Salt) intake    It is important that you exercise regularly, at least 20 minutes 3 to 4 times per week.  If you develop chest pain or shortness of breath seek  medical attention.  Return in 6 months for follow-up  

## 2014-04-06 DIAGNOSIS — H4011X0 Primary open-angle glaucoma, stage unspecified: Secondary | ICD-10-CM | POA: Diagnosis not present

## 2014-04-06 DIAGNOSIS — H43399 Other vitreous opacities, unspecified eye: Secondary | ICD-10-CM | POA: Diagnosis not present

## 2014-04-06 DIAGNOSIS — H26499 Other secondary cataract, unspecified eye: Secondary | ICD-10-CM | POA: Diagnosis not present

## 2014-07-20 ENCOUNTER — Encounter (HOSPITAL_COMMUNITY): Payer: Self-pay | Admitting: Emergency Medicine

## 2014-07-20 ENCOUNTER — Emergency Department (HOSPITAL_COMMUNITY)
Admission: EM | Admit: 2014-07-20 | Discharge: 2014-07-20 | Disposition: A | Discharge status: Home / Self Care | Payer: Medicare Other | Attending: Emergency Medicine | Admitting: Emergency Medicine

## 2014-07-20 ENCOUNTER — Emergency Department (HOSPITAL_COMMUNITY): Payer: Medicare Other

## 2014-07-20 DIAGNOSIS — F411 Generalized anxiety disorder: Secondary | ICD-10-CM | POA: Diagnosis not present

## 2014-07-20 DIAGNOSIS — Z87891 Personal history of nicotine dependence: Secondary | ICD-10-CM | POA: Diagnosis not present

## 2014-07-20 DIAGNOSIS — Z8739 Personal history of other diseases of the musculoskeletal system and connective tissue: Secondary | ICD-10-CM | POA: Diagnosis not present

## 2014-07-20 DIAGNOSIS — I10 Essential (primary) hypertension: Secondary | ICD-10-CM | POA: Insufficient documentation

## 2014-07-20 DIAGNOSIS — Z8639 Personal history of other endocrine, nutritional and metabolic disease: Secondary | ICD-10-CM | POA: Insufficient documentation

## 2014-07-20 DIAGNOSIS — Z7982 Long term (current) use of aspirin: Secondary | ICD-10-CM | POA: Diagnosis not present

## 2014-07-20 DIAGNOSIS — R42 Dizziness and giddiness: Secondary | ICD-10-CM | POA: Diagnosis not present

## 2014-07-20 DIAGNOSIS — Z79899 Other long term (current) drug therapy: Secondary | ICD-10-CM | POA: Insufficient documentation

## 2014-07-20 DIAGNOSIS — Z862 Personal history of diseases of the blood and blood-forming organs and certain disorders involving the immune mechanism: Secondary | ICD-10-CM | POA: Insufficient documentation

## 2014-07-20 DIAGNOSIS — R059 Cough, unspecified: Secondary | ICD-10-CM | POA: Diagnosis not present

## 2014-07-20 DIAGNOSIS — K219 Gastro-esophageal reflux disease without esophagitis: Secondary | ICD-10-CM | POA: Diagnosis not present

## 2014-07-20 DIAGNOSIS — R05 Cough: Secondary | ICD-10-CM | POA: Diagnosis not present

## 2014-07-20 DIAGNOSIS — R6889 Other general symptoms and signs: Secondary | ICD-10-CM | POA: Diagnosis not present

## 2014-07-20 LAB — CBC WITH DIFFERENTIAL/PLATELET
BASOS ABS: 0 10*3/uL (ref 0.0–0.1)
BASOS PCT: 0 % (ref 0–1)
EOS PCT: 1 % (ref 0–5)
Eosinophils Absolute: 0.1 10*3/uL (ref 0.0–0.7)
HCT: 44 % (ref 36.0–46.0)
Hemoglobin: 14.5 g/dL (ref 12.0–15.0)
LYMPHS PCT: 11 % — AB (ref 12–46)
Lymphs Abs: 0.9 10*3/uL (ref 0.7–4.0)
MCH: 29 pg (ref 26.0–34.0)
MCHC: 33 g/dL (ref 30.0–36.0)
MCV: 88 fL (ref 78.0–100.0)
Monocytes Absolute: 0.9 10*3/uL (ref 0.1–1.0)
Monocytes Relative: 11 % (ref 3–12)
Neutro Abs: 6 10*3/uL (ref 1.7–7.7)
Neutrophils Relative %: 77 % (ref 43–77)
PLATELETS: 236 10*3/uL (ref 150–400)
RBC: 5 MIL/uL (ref 3.87–5.11)
RDW: 13.3 % (ref 11.5–15.5)
WBC: 7.9 10*3/uL (ref 4.0–10.5)

## 2014-07-20 LAB — COMPREHENSIVE METABOLIC PANEL
ALBUMIN: 3.7 g/dL (ref 3.5–5.2)
ALT: 13 U/L (ref 0–35)
ANION GAP: 12 (ref 5–15)
AST: 17 U/L (ref 0–37)
Alkaline Phosphatase: 91 U/L (ref 39–117)
BUN: 16 mg/dL (ref 6–23)
CALCIUM: 11.4 mg/dL — AB (ref 8.4–10.5)
CO2: 23 mEq/L (ref 19–32)
Chloride: 97 mEq/L (ref 96–112)
Creatinine, Ser: 0.77 mg/dL (ref 0.50–1.10)
GFR calc non Af Amer: 77 mL/min — ABNORMAL LOW (ref >90)
GFR, EST AFRICAN AMERICAN: 89 mL/min — AB (ref >90)
Glucose, Bld: 87 mg/dL (ref 70–99)
Potassium: 4.3 mEq/L (ref 3.7–5.3)
SODIUM: 132 meq/L — AB (ref 137–147)
TOTAL PROTEIN: 6.8 g/dL (ref 6.0–8.3)
Total Bilirubin: 0.4 mg/dL (ref 0.3–1.2)

## 2014-07-20 MED ORDER — MECLIZINE HCL 25 MG PO TABS: 25.0000 mg | ORAL_TABLET | Freq: Four times a day (QID) | ORAL | Status: DC | PRN

## 2014-07-20 MED ORDER — MECLIZINE HCL 25 MG PO TABS
25.0000 mg | ORAL_TABLET | Freq: Once | ORAL | Status: AC
  Administered 2014-07-20: 25 mg via ORAL
  Filled 2014-07-20: qty 1

## 2014-07-20 NOTE — ED Notes (Signed)
Per EMS: pt states he is compliant with meds, husband died 1 year ago today due to pt. Pt c/o HTN, pt c/o dizziness, states she normally gets dizzy when BP is low. Was told to eat salty foods by person and pt did.

## 2014-07-20 NOTE — Discharge Instructions (Signed)
Take the meclizine for dizziness. Avoid salty foods for the next several days. Continue taking your blood pressure medications.  Recheck if you get a headache, vomiting or you feel worse.   Vertigo Vertigo means you feel like you or your surroundings are moving when they are not. Vertigo can be dangerous if it occurs when you are at work, driving, or performing difficult activities.  CAUSES  Vertigo occurs when there is a conflict of signals sent to your brain from the visual and sensory systems in your body. There are many different causes of vertigo, including:  Infections, especially in the inner ear.  A bad reaction to a drug or misuse of alcohol and medicines.  Withdrawal from drugs or alcohol.  Rapidly changing positions, such as lying down or rolling over in bed.  A migraine headache.  Decreased blood flow to the brain.  Increased pressure in the brain from a head injury, infection, tumor, or bleeding. SYMPTOMS  You may feel as though the world is spinning around or you are falling to the ground. Because your balance is upset, vertigo can cause nausea and vomiting. You may have involuntary eye movements (nystagmus). DIAGNOSIS  Vertigo is usually diagnosed by physical exam. If the cause of your vertigo is unknown, your caregiver may perform imaging tests, such as an MRI scan (magnetic resonance imaging). TREATMENT  Most cases of vertigo resolve on their own, without treatment. Depending on the cause, your caregiver may prescribe certain medicines. If your vertigo is related to body position issues, your caregiver may recommend movements or procedures to correct the problem. In rare cases, if your vertigo is caused by certain inner ear problems, you may need surgery. HOME CARE INSTRUCTIONS   Follow your caregiver's instructions.  Avoid driving.  Avoid operating heavy machinery.  Avoid performing any tasks that would be dangerous to you or others during a vertigo  episode.  Tell your caregiver if you notice that certain medicines seem to be causing your vertigo. Some of the medicines used to treat vertigo episodes can actually make them worse in some people. SEEK IMMEDIATE MEDICAL CARE IF:   Your medicines do not relieve your vertigo or are making it worse.  You develop problems with talking, walking, weakness, or using your arms, hands, or legs.  You develop severe headaches.  Your nausea or vomiting continues or gets worse.  You develop visual changes.  A family member notices behavioral changes.  Your condition gets worse. MAKE SURE YOU:  Understand these instructions.  Will watch your condition.  Will get help right away if you are not doing well or get worse. Document Released: 08/08/2005 Document Revised: 01/21/2012 Document Reviewed: 05/17/2011 [facility] Patient Information 2015 [place], [place]. This information is not intended to replace advice given to you by your health care provider. Make sure you discuss any questions you have with your health care provider.  Managing Your High Blood Pressure Blood pressure is a measurement of how forceful your blood is pressing against the walls of the arteries. Arteries are muscular tubes within the circulatory system. Blood pressure does not stay the same. Blood pressure rises when you are active, excited, or nervous; and it lowers during sleep and relaxation. If the numbers measuring your blood pressure stay above normal most of the time, you are at risk for health problems. High blood pressure (hypertension) is a long-term (chronic) condition in which blood pressure is elevated. A blood pressure reading is recorded as two numbers, such as 120 over 80 (  or 120/80). The first, higher number is called the systolic pressure. It is a measure of the pressure in your arteries as the heart beats. The second, lower number is called the diastolic pressure. It is a measure of the pressure in your arteries as  the heart relaxes between beats.  Keeping your blood pressure in a normal range is important to your overall health and prevention of health problems, such as heart disease and stroke. When your blood pressure is uncontrolled, your heart has to work harder than normal. High blood pressure is a very common condition in adults because blood pressure tends to rise with age. Men and women are equally likely to have hypertension but at different times in life. Before age 13, men are more likely to have hypertension. After 78 years of age, women are more likely to have it. Hypertension is especially common in African Americans. This condition often has no signs or symptoms. The cause of the condition is usually not known. Your caregiver can help you come up with a plan to keep your blood pressure in a normal, healthy range. BLOOD PRESSURE STAGES Blood pressure is classified into four stages: normal, prehypertension, stage 1, and stage 2. Your blood pressure reading will be used to determine what type of treatment, if any, is necessary. Appropriate treatment options are tied to these four stages:  Normal  Systolic pressure (mm Hg): below 120.  Diastolic pressure (mm Hg): below 80. Prehypertension  Systolic pressure (mm Hg): 120 to 139.  Diastolic pressure (mm Hg): 80 to 89. Stage1  Systolic pressure (mm Hg): 140 to 159.  Diastolic pressure (mm Hg): 90 to 99. Stage2  Systolic pressure (mm Hg): 160 or above.  Diastolic pressure (mm Hg): 100 or above. RISKS RELATED TO HIGH BLOOD PRESSURE Managing your blood pressure is an important responsibility. Uncontrolled high blood pressure can lead to:  A heart attack.  A stroke.  A weakened blood vessel (aneurysm).  Heart failure.  Kidney damage.  Eye damage.  Metabolic syndrome.  Memory and concentration problems. HOW TO MANAGE YOUR BLOOD PRESSURE Blood pressure can be managed effectively with lifestyle changes and medicines (if needed).  Your caregiver will help you come up with a plan to bring your blood pressure within a normal range. Your plan should include the following: Education  Read all information provided by your caregivers about how to control blood pressure.  Educate yourself on the latest guidelines and treatment recommendations. New research is always being done to further define the risks and treatments for high blood pressure. Lifestylechanges  Control your weight.  Avoid smoking.  Stay physically active.  Reduce the amount of salt in your diet.  Reduce stress.  Control any chronic conditions, such as high cholesterol or diabetes.  Reduce your alcohol intake. Medicines  Several medicines (antihypertensive medicines) are available, if needed, to bring blood pressure within a normal range. Communication  Review all the medicines you take with your caregiver because there may be side effects or interactions.  Talk with your caregiver about your diet, exercise habits, and other lifestyle factors that may be contributing to high blood pressure.  See your caregiver regularly. Your caregiver can help you create and adjust your plan for managing high blood pressure. RECOMMENDATIONS FOR TREATMENT AND FOLLOW-UP  The following recommendations are based on current guidelines for managing high blood pressure in nonpregnant adults. Use these recommendations to identify the proper follow-up period or treatment option based on your blood pressure reading. You can discuss  these options with your caregiver.  Systolic pressure of 109 to 323 or diastolic pressure of 80 to 89: Follow up with your caregiver as directed.  Systolic pressure of 557 to 322 or diastolic pressure of 90 to 100: Follow up with your caregiver within 2 months.  Systolic pressure above 025 or diastolic pressure above 427: Follow up with your caregiver within 1 month.  Systolic pressure above 062 or diastolic pressure above 376: Consider  antihypertensive therapy; follow up with your caregiver within 1 week.  Systolic pressure above 283 or diastolic pressure above 151: Begin antihypertensive therapy; follow up with your caregiver within 1 week. Document Released: 07/23/2012 Document Reviewed: 07/23/2012 [facility] Patient Information 2015 [place]. This information is not intended to replace advice given to you by your health care provider. Make sure you discuss any questions you have with your health care provider.

## 2014-07-20 NOTE — ED Provider Notes (Signed)
CSN: [id number]     Arrival date & time 07/20/14  1159 History   First MD Initiated Contact with Patient 07/20/14 1204     Chief Complaint  Patient presents with  . Hypertension     (Consider location/radiation/quality/duration/timing/severity/associated sxs/prior Treatment) HPI Patient reports she normally has a low blood pressure. She states her blood pressures typically 130/80. She had dizziness yesterday which she associates with having a low blood pressure. She states her head was swimming. She tried to take her blood pressure however her machine kept reading "error". Patient states she poured salt in her hand and ate it and then drank water thinking that would help increase her blood pressure. However when her son checked her blood pressure during the night her blood pressure had gone up to 180/90. She took an extra Lotensin at 2 AM. She had been on Norvasc last year however it made her dizzy and she has not been taking it. She did however take 1 at 10 AM this morning. She denies chest pain, shortness of breath or headache. She also reports last night before she starts feeling bad she and her son ate Corazon and she a lot. She reports some diarrhea since that time. She also describes a spinning sensation when she stands up.  Patient states she has had a cough for about 6 months. She was treated by her PCP. She states her cough was almost gone however it seems to be returning. She has an appointment with her PCP later this month.  PCP Dr Burnice Logan  Past Medical History  Diagnosis Date  . GERD 10/03/2007  . HYPERTENSION 10/03/2007  . OSTEOPENIA 02/03/2008  . Primary hyperparathyroidism 10/03/2007   Past Surgical History  Procedure Laterality Date  . Abdominal hysterectomy     No family history on file. History  Substance Use Topics  . Smoking status: Former Smoker    Quit date: 11/12/1978  . Smokeless tobacco: Never Used  . Alcohol Use: No   Lives at home Lives with  son widowed  OB History   Grav Para Term Preterm Abortions TAB SAB Ect Mult Living                 Review of Systems  All other systems reviewed and are negative.     Allergies  Review of patient's allergies indicates no known allergies.  Home Medications   Prior to Admission medications   Medication Sig Start Date End Date Taking? Authorizing Provider  amLODipine (NORVASC) 2.5 MG tablet Take 2.5 mg by mouth daily with breakfast.   Yes Historical Provider, MD  aspirin 81 MG chewable tablet Chew 81 mg by mouth daily.   Yes Historical Provider, MD  benazepril (LOTENSIN) 40 MG tablet Take 40 mg by mouth daily with breakfast.   Yes Historical Provider, MD  furosemide (LASIX) 40 MG tablet Take 40 mg by mouth daily as needed for fluid.   Yes Historical Provider, MD  ibuprofen (ADVIL,MOTRIN) 200 MG tablet Take 400 mg by mouth daily as needed for moderate pain.   Yes Historical Provider, MD  latanoprost (XALATAN) 0.005 % ophthalmic solution Place 1 drop into both eyes at bedtime.  04/21/11  Yes Historical Provider, MD  omeprazole (PRILOSEC) 20 MG capsule Take 20 mg by mouth daily with breakfast.   Yes Historical Provider, MD  meclizine (ANTIVERT) 25 MG tablet Take 1 tablet (25 mg total) by mouth 4 (four) times daily as needed. 07/20/14   Janice Norrie, MD   BP  164/81  Pulse 70  Temp(Src) 98.4 F (36.9 C) (Oral)  Resp 16  SpO2 99%  Vital signs normal except hypertension  Physical Exam  Nursing note and vitals reviewed. Constitutional: She is oriented to person, place, and time. She appears well-developed and well-nourished.  Non-toxic appearance. She does not appear ill. No distress.  HENT:  Head: Normocephalic and atraumatic.  Right Ear: External ear normal.  Left Ear: External ear normal.  Nose: Nose normal. No mucosal edema or rhinorrhea.  Mouth/Throat: Oropharynx is clear and moist and mucous membranes are normal. No dental abscesses or uvula swelling.  Eyes: Conjunctivae and EOM  are normal. Pupils are equal, round, and reactive to light.  Neck: Normal range of motion and full passive range of motion without pain. Neck supple.  Cardiovascular: Normal rate, regular rhythm and normal heart sounds.  Exam reveals no gallop and no friction rub.   No murmur heard. Pulmonary/Chest: Effort normal and breath sounds normal. No respiratory distress. She has no wheezes. She has no rhonchi. She has no rales. She exhibits no tenderness and no crepitus.  Abdominal: Soft. Normal appearance and bowel sounds are normal. She exhibits no distension. There is no tenderness. There is no rebound and no guarding.  Musculoskeletal: Normal range of motion. She exhibits no edema and no tenderness.  Moves all extremities well.   Neurological: She is alert and oriented to person, place, and time. She has normal strength. No cranial nerve deficit.  Skin: Skin is warm, dry and intact. No rash noted. No erythema. No pallor.  Psychiatric: Her behavior is normal. Her mood appears anxious. Her speech is rapid and/or pressured.    ED Course  Procedures (including critical care time)  Medications  meclizine (ANTIVERT) tablet 25 mg (25 mg Oral Given 07/20/14 1324)   Pt reports her dizziness is better. Her BP is 159/89. She was able to get up and use a bedside bed pan by herself.  Pt is eating crackers.    Labs Review Results for orders placed during the hospital encounter of 07/20/14  CBC WITH DIFFERENTIAL      Result Value Ref Range   WBC 7.9  4.0 - 10.5 K/uL   RBC 5.00  3.87 - 5.11 MIL/uL   Hemoglobin 14.5  12.0 - 15.0 g/dL   HCT 44.0  36.0 - 46.0 %   MCV 88.0  78.0 - 100.0 fL   MCH 29.0  26.0 - 34.0 pg   MCHC 33.0  30.0 - 36.0 g/dL   RDW 13.3  11.5 - 15.5 %   Platelets 236  150 - 400 K/uL   Neutrophils Relative % 77  43 - 77 %   Neutro Abs 6.0  1.7 - 7.7 K/uL   Lymphocytes Relative 11 (*) 12 - 46 %   Lymphs Abs 0.9  0.7 - 4.0 K/uL   Monocytes Relative 11  3 - 12 %   Monocytes Absolute 0.9   0.1 - 1.0 K/uL   Eosinophils Relative 1  0 - 5 %   Eosinophils Absolute 0.1  0.0 - 0.7 K/uL   Basophils Relative 0  0 - 1 %   Basophils Absolute 0.0  0.0 - 0.1 K/uL  COMPREHENSIVE METABOLIC PANEL      Result Value Ref Range   Sodium 132 (*) 137 - 147 mEq/L   Potassium 4.3  3.7 - 5.3 mEq/L   Chloride 97  96 - 112 mEq/L   CO2 23  19 - 32 mEq/L  Glucose, Bld 87  70 - 99 mg/dL   BUN 16  6 - 23 mg/dL   Creatinine, Ser 0.77  0.50 - 1.10 mg/dL   Calcium 11.4 (*) 8.4 - 10.5 mg/dL   Total Protein 6.8  6.0 - 8.3 g/dL   Albumin 3.7  3.5 - 5.2 g/dL   AST 17  0 - 37 U/L   ALT 13  0 - 35 U/L   Alkaline Phosphatase 91  39 - 117 U/L   Total Bilirubin 0.4  0.3 - 1.2 mg/dL   GFR calc non Af Amer 77 (*) >90 mL/min   GFR calc Af Amer 89 (*) >90 mL/min   Anion gap 12  5 - 15      Imaging Review Dg Chest 2 View  07/20/2014   CLINICAL DATA:  Cough for 6 months.  EXAM: CHEST  2 VIEW  COMPARISON:  None.  FINDINGS: Heart size and pulmonary vascularity are normal. The lungs are clear but hyperinflated suggesting emphysema. No effusions. No acute osseous abnormality.  IMPRESSION: Findings suggestive of emphysema.   Electronically Signed   By: Rozetta Nunnery M.D.   On: 07/20/2014 13:04     EKG Interpretation None      MDM   Final diagnoses:  Dizziness  Essential hypertension  Vertigo    New Prescriptions   MECLIZINE (ANTIVERT) 25 MG TABLET    Take 1 tablet (25 mg total) by mouth 4 (four) times daily as needed.    Plan discharge   Rolland Porter, MD, Alanson Aly, MD 07/20/14 [phone]

## 2014-07-20 NOTE — ED Notes (Signed)
Bed: AJ51 Expected date:  Expected time:  Means of arrival:  Comments: ems- 80 yo, high blood pressure

## 2014-08-04 DIAGNOSIS — Z23 Encounter for immunization: Secondary | ICD-10-CM | POA: Diagnosis not present

## 2014-08-10 ENCOUNTER — Encounter: Payer: Self-pay | Admitting: Internal Medicine

## 2014-08-10 ENCOUNTER — Ambulatory Visit (INDEPENDENT_AMBULATORY_CARE_PROVIDER_SITE_OTHER): Payer: Medicare Other | Admitting: Internal Medicine

## 2014-08-10 VITALS — BP 130/80 | HR 64 | Temp 97.7°F | Resp 20 | Ht 62.5 in | Wt 185.0 lb

## 2014-08-10 DIAGNOSIS — K219 Gastro-esophageal reflux disease without esophagitis: Secondary | ICD-10-CM

## 2014-08-10 DIAGNOSIS — I10 Essential (primary) hypertension: Secondary | ICD-10-CM | POA: Diagnosis not present

## 2014-08-10 DIAGNOSIS — M899 Disorder of bone, unspecified: Secondary | ICD-10-CM | POA: Diagnosis not present

## 2014-08-10 DIAGNOSIS — M949 Disorder of cartilage, unspecified: Secondary | ICD-10-CM

## 2014-08-10 DIAGNOSIS — Z23 Encounter for immunization: Secondary | ICD-10-CM | POA: Diagnosis not present

## 2014-08-10 DIAGNOSIS — E210 Primary hyperparathyroidism: Secondary | ICD-10-CM | POA: Diagnosis not present

## 2014-08-10 NOTE — Progress Notes (Signed)
Pre visit review using our clinic review tool, if applicable. No additional management support is needed unless otherwise documented below in the visit note. 

## 2014-08-10 NOTE — Patient Instructions (Signed)
Limit your sodium (Salt) intake  Return in 6 months for follow-up  

## 2014-08-10 NOTE — Progress Notes (Signed)
Subjective:    Patient ID: Annette [name], female    DOB: 1929-12-01, 80 y.o.   MRN: [id number]  HPI 80 year old patient who is seen today for her biannual followup.  She has treated hypertension and history of gastroesophageal reflux disease.  She was seen in the ED on September 8 due to vertigo.  This has not reoccurred. She has significant osteoarthritis affecting primarily the knees. No cardiopulmonary complaints.  A chest x-ray was obtained that revealed some hyperinflation  Past Medical History  Diagnosis Date  . GERD 10/03/2007  . HYPERTENSION 10/03/2007  . OSTEOPENIA 02/03/2008  . Primary hyperparathyroidism 10/03/2007    History   Social History  . Marital Status: Married    Spouse Name: N/A    Number of Children: N/A  . Years of Education: N/A   Occupational History  . Not on file.   Social History Main Topics  . Smoking status: Former Smoker    Quit date: 11/12/1978  . Smokeless tobacco: Never Used  . Alcohol Use: No  . Drug Use: No  . Sexual Activity: Not on file   Other Topics Concern  . Not on file   Social History Narrative  . No narrative on file    Past Surgical History  Procedure Laterality Date  . Abdominal hysterectomy      No family history on file.  No Known Allergies  Current Outpatient Prescriptions on File Prior to Visit  Medication Sig Dispense Refill  . amLODipine (NORVASC) 2.5 MG tablet Take 2.5 mg by mouth daily with breakfast.      . aspirin 81 MG chewable tablet Chew 81 mg by mouth daily.      . benazepril (LOTENSIN) 40 MG tablet Take 40 mg by mouth daily with breakfast.      . furosemide (LASIX) 40 MG tablet Take 40 mg by mouth daily as needed for fluid.      Marland Kitchen ibuprofen (ADVIL,MOTRIN) 200 MG tablet Take 400 mg by mouth daily as needed for moderate pain.      Marland Kitchen latanoprost (XALATAN) 0.005 % ophthalmic solution Place 1 drop into both eyes at bedtime.       . meclizine (ANTIVERT) 25 MG tablet Take 1 tablet (25 mg total) by  mouth 4 (four) times daily as needed.  40 tablet  0  . omeprazole (PRILOSEC) 20 MG capsule Take 20 mg by mouth daily with breakfast.       No current facility-administered medications on file prior to visit.    BP 130/80  Pulse 64  Temp(Src) 97.7 F (36.5 C) (Oral)  Resp 20  Ht 5' 2.5" (1.588 m)  Wt 185 lb (83.915 kg)  BMI 33.28 kg/m2  SpO2 96%      Review of Systems  Constitutional: Negative.   HENT: Negative for congestion, dental problem, hearing loss, rhinorrhea, sinus pressure, sore throat and tinnitus.   Eyes: Negative for pain, discharge and visual disturbance.  Respiratory: Negative for cough and shortness of breath.   Cardiovascular: Negative for chest pain, palpitations and leg swelling.  Gastrointestinal: Negative for nausea, vomiting, abdominal pain, diarrhea, constipation, blood in stool and abdominal distention.  Genitourinary: Negative for dysuria, urgency, frequency, hematuria, flank pain, vaginal bleeding, vaginal discharge, difficulty urinating, vaginal pain and pelvic pain.  Musculoskeletal: Positive for gait problem. Negative for arthralgias and joint swelling.       Knee and ankle pain  Skin: Negative for rash.  Neurological: Negative for dizziness, syncope, speech difficulty, weakness, numbness and headaches.  Hematological: Negative for adenopathy.  Psychiatric/Behavioral: Negative for behavioral problems, dysphoric mood and agitation. The patient is not nervous/anxious.        Objective:   Physical Exam  Constitutional: She is oriented to person, place, and time. She appears well-developed and well-nourished.  Obese.  Blood pressure well controlled  HENT:  Head: Normocephalic.  Right Ear: External ear normal.  Left Ear: External ear normal.  Mouth/Throat: Oropharynx is clear and moist.  Eyes: Conjunctivae and EOM are normal. Pupils are equal, round, and reactive to light.  Neck: Normal range of motion. Neck supple. No thyromegaly present.    Cardiovascular: Normal rate, regular rhythm, normal heart sounds and intact distal pulses.   Pulmonary/Chest: Effort normal and breath sounds normal. No respiratory distress. She has no wheezes. She has no rales.  Abdominal: Soft. Bowel sounds are normal. She exhibits no mass. There is no tenderness.  Musculoskeletal: Normal range of motion.  Lymphadenopathy:    She has no cervical adenopathy.  Neurological: She is alert and oriented to person, place, and time.  Skin: Skin is warm and dry. No rash noted.  Psychiatric: She has a normal mood and affect. Her behavior is normal.          Assessment & Plan:   Hypertension well controlled Exogenous obesity.  Weight loss encouraged Osteoarthritis Gastroesophageal reflux disease.  Continue Prilosec  Recheck in 6 months

## 2014-08-16 ENCOUNTER — Other Ambulatory Visit: Payer: Self-pay | Admitting: Internal Medicine

## 2014-09-13 ENCOUNTER — Other Ambulatory Visit: Payer: Self-pay | Admitting: Internal Medicine

## 2014-09-23 ENCOUNTER — Telehealth: Payer: Self-pay | Admitting: Internal Medicine

## 2014-09-23 NOTE — Telephone Encounter (Signed)
Pt would like you to know she will be switching to [facility] from now on. Mcarthur Rossetti will be sending a fax.Marland Kitchen

## 2014-09-23 NOTE — Telephone Encounter (Signed)
Pharmacy changed

## 2014-09-24 ENCOUNTER — Telehealth: Payer: Self-pay | Admitting: Internal Medicine

## 2014-09-24 MED ORDER — AMLODIPINE BESYLATE 2.5 MG PO TABS: 2.5000 mg | ORAL_TABLET | Freq: Every day | ORAL | Status: DC

## 2014-09-24 MED ORDER — IBUPROFEN 200 MG PO TABS: 400.0000 mg | ORAL_TABLET | Freq: Every day | ORAL | Status: DC | PRN

## 2014-09-24 MED ORDER — OMEPRAZOLE 20 MG PO CPDR: DELAYED_RELEASE_CAPSULE | ORAL | Status: DC

## 2014-09-24 MED ORDER — BENAZEPRIL HCL 40 MG PO TABS: ORAL_TABLET | ORAL | Status: DC

## 2014-09-24 MED ORDER — FUROSEMIDE 40 MG PO TABS: 40.0000 mg | ORAL_TABLET | Freq: Every day | ORAL | Status: DC | PRN

## 2014-09-24 NOTE — Telephone Encounter (Signed)
[name], Annette Dean [phone] is requesting re-fills on amLODipine (NORVASC) 2.5 MG tablet , benazepril (LOTENSIN) 40 MG tablet, furosemide (LASIX) 40 MG tablet, omeprazole (PRILOSEC) 20 MG capsule, ibuprofen (ADVIL,MOTRIN) 200 MG tablet

## 2014-09-24 NOTE — Telephone Encounter (Signed)
Rx's sent to [facility] 

## 2014-12-17 ENCOUNTER — Other Ambulatory Visit: Payer: Self-pay | Admitting: Internal Medicine

## 2014-12-17 ENCOUNTER — Ambulatory Visit (INDEPENDENT_AMBULATORY_CARE_PROVIDER_SITE_OTHER): Payer: Commercial Managed Care - HMO | Admitting: Internal Medicine

## 2014-12-17 ENCOUNTER — Encounter: Payer: Self-pay | Admitting: Internal Medicine

## 2014-12-17 VITALS — BP 138/68 | HR 78 | Temp 97.7°F | Resp 20 | Ht 62.5 in | Wt 183.0 lb

## 2014-12-17 DIAGNOSIS — M949 Disorder of cartilage, unspecified: Secondary | ICD-10-CM

## 2014-12-17 DIAGNOSIS — M899 Disorder of bone, unspecified: Secondary | ICD-10-CM

## 2014-12-17 DIAGNOSIS — R531 Weakness: Secondary | ICD-10-CM

## 2014-12-17 DIAGNOSIS — R2681 Unsteadiness on feet: Secondary | ICD-10-CM

## 2014-12-17 DIAGNOSIS — I10 Essential (primary) hypertension: Secondary | ICD-10-CM | POA: Diagnosis not present

## 2014-12-17 DIAGNOSIS — E210 Primary hyperparathyroidism: Secondary | ICD-10-CM | POA: Diagnosis not present

## 2014-12-17 DIAGNOSIS — Z9181 History of falling: Secondary | ICD-10-CM

## 2014-12-17 LAB — COMPREHENSIVE METABOLIC PANEL
ALBUMIN: 4.1 g/dL (ref 3.5–5.2)
ALT: 18 U/L (ref 0–35)
AST: 20 U/L (ref 0–37)
Alkaline Phosphatase: 89 U/L (ref 39–117)
BUN: 19 mg/dL (ref 6–23)
CALCIUM: 11 mg/dL — AB (ref 8.4–10.5)
CO2: 28 mEq/L (ref 19–32)
CREATININE: 1.08 mg/dL (ref 0.40–1.20)
Chloride: 96 mEq/L (ref 96–112)
GFR: 51.6 mL/min — ABNORMAL LOW (ref >60.00)
Glucose, Bld: 100 mg/dL — ABNORMAL HIGH (ref 70–99)
Potassium: 4.3 mEq/L (ref 3.5–5.1)
SODIUM: 130 meq/L — AB (ref 135–145)
Total Bilirubin: 0.5 mg/dL (ref 0.2–1.2)
Total Protein: 6.8 g/dL (ref 6.0–8.3)

## 2014-12-17 MED ORDER — MECLIZINE HCL 25 MG PO TABS: 25.0000 mg | ORAL_TABLET | Freq: Four times a day (QID) | ORAL | Status: DC | PRN

## 2014-12-17 NOTE — Progress Notes (Signed)
Pre visit review using our clinic review tool, if applicable. No additional management support is needed unless otherwise documented below in the visit note. 

## 2014-12-17 NOTE — Patient Instructions (Signed)
Hypercalcemia Hypercalcemia means the calcium in your blood is too high. Calcium in our blood is important for the control of many things, such as:  Blood clotting.  Conducting of nerve impulses.  Muscle contraction.  Maintaining teeth and bone health.  Other body functions. In the bloodstream, calcium maintains a constant balance with another mineral, phosphate. Calcium is absorbed into the body through the small intestine. This is helped by vitamin D. Calcium levels are maintained mostly by vitamin D and a hormone (parathyroid hormone). But the kidneys also help. Hypercalcemia can happen when the concentration of calcium is too high for the kidneys to maintain balance. The body maintains a balance between the calcium we eat and the calcium already in our body. If calcium intake is increased or we cannot use calcium properly, there may be problems. Some common sources of calcium are:   Dairy products.  Nuts.  Eggs.  Whole grains.  Legumes.  Green leafy vegetables. CAUSES There are many causes of this condition, but some common ones are:  Hyperparathyroidism. This is an overactivity of the parathyroid gland.  Cancers of the breast, kidney, lung, head, and neck are common causes of calcium increases.  Medications that cause you to urinate more often (diuretics), nausea, vomiting, and diarrhea also increase the calcium in the blood.  Overuse of calcium-containing antacids. SYMPTOMS  Many patients with mild hypercalcemia have no symptoms. For those with symptoms, common problems include:  Loss of appetite.  Constipation.  Increased thirst.  Heart rhythm changes.  Abnormal thinking.  Nausea.  Abdominal pain.  Kidney stones.  Mood swings.  Coma and death when severe.  Vomiting.  Increased urination.  High blood pressure.  Confusion. DIAGNOSIS   Your caregiver will do a medical history and perform a physical exam on you.  Calcium and parathyroid hormone  (PTH) may be measured with a blood test. TREATMENT   The treatment depends on the calcium level and what is causing the higher level. Hypercalcemia can be life threatening. Fast lowering of the calcium level may be necessary.  With normal kidney function, fluids can be given by vein to clear the excess calcium. Hemodialysis works well to reduce dangerous calcium levels if there is poor kidney function. This is a procedure in which a machine is used to filter out unwanted substances. The blood is then returned to the body.  Drugs, such as diuretics, can be given after adequate fluid intake is established. These medications help the kidneys get rid of extra calcium. Drugs that lessen (inhibit) bone loss are helpful in gaining long-term control. Phosphate pills help lower high calcium levels caused by a low supply of phosphate. Anti-inflammatory agents such as steroids are helpful with some cancers and toxic levels of vitamin D.  Treatment of the underlying cause of the hypercalcemia will also correct the imbalance. Hyperparathyroidism is usually treated by surgical removal of one or more of the parathyroid glands and any tissue, other than the glands themselves, that is producing too much hormone.  The hypercalcemia caused by cancer is difficult to treat without controlling the cancer. Symptoms can be improved with fluids and drug therapy as outlined above. PROGNOSIS   Surgery to remove the parathyroid glands is usually successful. This also depends on the amount of damage to the kidneys and whether or not it can be treated.  Mild hypercalcemia can be controlled with good fluid intake and the use of effective medications.  Hypercalcemia often develops as a late complication of cancer. The expected outlook   is poor without effective anticancer therapy. PREVENTION   If you are at risk for developing hypercalcemia, be familiar with early symptoms. Report these to your caregiver.  Good fluid intake  (up to four quarts of liquid a day if possible) is helpful.  Try to control nausea and vomiting, and treat fevers to avoid dehydration.  Lowering the amount of calcium in your diet is not necessary. High blood calcium reduces absorption of calcium in the intestine.  Stay as active as possible. SEEK IMMEDIATE MEDICAL CARE IF:   You develop chest pain, sweating, or shortness of breath.  You get confused, feel faint or pass out.  You develop severe nausea and vomiting. MAKE SURE YOU:   Understand these instructions.  Will watch your condition.  Will get help right away if you are not doing well or get worse. Document Released: 01/12/2005 Document Revised: 03/15/2014 Document Reviewed: 10/24/2010 [facility] Patient Information 2015 [place], [place]. This information is not intended to replace advice given to you by your health care provider. Make sure you discuss any questions you have with your health care provider. Hypercalcemia Hypercalcemia means the calcium in your blood is too high. Calcium in our blood is important for the control of many things, such as:  Blood clotting.  Conducting of nerve impulses.  Muscle contraction.  Maintaining teeth and bone health.  Other body functions. In the bloodstream, calcium maintains a constant balance with another mineral, phosphate. Calcium is absorbed into the body through the small intestine. This is helped by vitamin D. Calcium levels are maintained mostly by vitamin D and a hormone (parathyroid hormone). But the kidneys also help. Hypercalcemia can happen when the concentration of calcium is too high for the kidneys to maintain balance. The body maintains a balance between the calcium we eat and the calcium already in our body. If calcium intake is increased or we cannot use calcium properly, there may be problems. Some common sources of calcium are:   Dairy products.  Nuts.  Eggs.  Whole grains.  Legumes.  Green leafy  vegetables. CAUSES There are many causes of this condition, but some common ones are:  Hyperparathyroidism. This is an overactivity of the parathyroid gland.  Cancers of the breast, kidney, lung, head, and neck are common causes of calcium increases.  Medications that cause you to urinate more often (diuretics), nausea, vomiting, and diarrhea also increase the calcium in the blood.  Overuse of calcium-containing antacids. SYMPTOMS  Many patients with mild hypercalcemia have no symptoms. For those with symptoms, common problems include:  Loss of appetite.  Constipation.  Increased thirst.  Heart rhythm changes.  Abnormal thinking.  Nausea.  Abdominal pain.  Kidney stones.  Mood swings.  Coma and death when severe.  Vomiting.  Increased urination.  High blood pressure.  Confusion. DIAGNOSIS   Your caregiver will do a medical history and perform a physical exam on you.  Calcium and parathyroid hormone (PTH) may be measured with a blood test. TREATMENT   The treatment depends on the calcium level and what is causing the higher level. Hypercalcemia can be life threatening. Fast lowering of the calcium level may be necessary.  With normal kidney function, fluids can be given by vein to clear the excess calcium. Hemodialysis works well to reduce dangerous calcium levels if there is poor kidney function. This is a procedure in which a machine is used to filter out unwanted substances. The blood is then returned to the body.  Drugs, such as diuretics, can  be given after adequate fluid intake is established. These medications help the kidneys get rid of extra calcium. Drugs that lessen (inhibit) bone loss are helpful in gaining long-term control. Phosphate pills help lower high calcium levels caused by a low supply of phosphate. Anti-inflammatory agents such as steroids are helpful with some cancers and toxic levels of vitamin D.  Treatment of the underlying cause of the  hypercalcemia will also correct the imbalance. Hyperparathyroidism is usually treated by surgical removal of one or more of the parathyroid glands and any tissue, other than the glands themselves, that is producing too much hormone.  The hypercalcemia caused by cancer is difficult to treat without controlling the cancer. Symptoms can be improved with fluids and drug therapy as outlined above. PROGNOSIS   Surgery to remove the parathyroid glands is usually successful. This also depends on the amount of damage to the kidneys and whether or not it can be treated.  Mild hypercalcemia can be controlled with good fluid intake and the use of effective medications.  Hypercalcemia often develops as a late complication of cancer. The expected outlook is poor without effective anticancer therapy. PREVENTION   If you are at risk for developing hypercalcemia, be familiar with early symptoms. Report these to your caregiver.  Good fluid intake (up to four quarts of liquid a day if possible) is helpful.  Try to control nausea and vomiting, and treat fevers to avoid dehydration.  Lowering the amount of calcium in your diet is not necessary. High blood calcium reduces absorption of calcium in the intestine.  Stay as active as possible. SEEK IMMEDIATE MEDICAL CARE IF:   You develop chest pain, sweating, or shortness of breath.  You get confused, feel faint or pass out.  You develop severe nausea and vomiting. MAKE SURE YOU:   Understand these instructions.  Will watch your condition.  Will get help right away if you are not doing well or get worse. Document Released: 01/12/2005 Document Revised: 03/15/2014 Document Reviewed: 10/24/2010 [facility] Patient Information 2015 [place], [place]. This information is not intended to replace advice given to you by your health care provider. Make sure you discuss any questions you have with your health care provider.

## 2014-12-17 NOTE — Progress Notes (Signed)
Subjective:    Patient ID: Annette [name], female    DOB: 1930/03/07, 81 y.o.   MRN: [id number]  HPI 81 year old patient who has a history of essential hypertension and hypercalcemia secondary to primary hyperparathyroidism.  She is seen today for general follow-up and also for a face-to-face visit to assess need for home health services.  She is accompanied by her son and daughter-in-law. She lives with her son and daughter-in-law but both the have full time jobs, and the patient is left alone throughout the day.  She has a history of arthritis and unsteady gait and walks with a cane.  She is a high fall risk and has the following recently. She has recently recovered from 6 days of severe vertigo.  She is requesting evaluation for home health care Her son describe some occasional mild confusion.  Past Medical History  Diagnosis Date  . GERD 10/03/2007  . HYPERTENSION 10/03/2007  . OSTEOPENIA 02/03/2008  . Primary hyperparathyroidism 10/03/2007    History   Social History  . Marital Status: Married    Spouse Name: N/A    Number of Children: N/A  . Years of Education: N/A   Occupational History  . Not on file.   Social History Main Topics  . Smoking status: Former Smoker    Quit date: 11/12/1978  . Smokeless tobacco: Never Used  . Alcohol Use: No  . Drug Use: No  . Sexual Activity: Not on file   Other Topics Concern  . Not on file   Social History Narrative    Past Surgical History  Procedure Laterality Date  . Abdominal hysterectomy      No family history on file.  No Known Allergies  Current Outpatient Prescriptions on File Prior to Visit  Medication Sig Dispense Refill  . amLODipine (NORVASC) 2.5 MG tablet Take 1 tablet (2.5 mg total) by mouth daily with breakfast. 90 tablet 1  . aspirin 81 MG chewable tablet Chew 81 mg by mouth daily.    . benazepril (LOTENSIN) 40 MG tablet TAKE ONE TABLET BY MOUTH ONCE DAILY 90 tablet 1  . furosemide (LASIX) 40 MG  tablet Take 1 tablet (40 mg total) by mouth daily as needed for fluid. 90 tablet 1  . ibuprofen (ADVIL,MOTRIN) 200 MG tablet Take 2 tablets (400 mg total) by mouth daily as needed for moderate pain. 180 tablet 1  . latanoprost (XALATAN) 0.005 % ophthalmic solution Place 1 drop into both eyes at bedtime.     Marland Kitchen omeprazole (PRILOSEC) 20 MG capsule TAKE ONE CAPSULE BY MOUTH ONCE DAILY 90 capsule 3   No current facility-administered medications on file prior to visit.    BP 138/68 mmHg  Pulse 78  Temp(Src) 97.7 F (36.5 C) (Oral)  Resp 20  Ht 5' 2.5" (1.588 m)  Wt 183 lb (83.008 kg)  BMI 32.92 kg/m2  SpO2 98%      Review of Systems  Constitutional: Positive for fatigue.  HENT: Negative for congestion, dental problem, hearing loss, rhinorrhea, sinus pressure, sore throat and tinnitus.   Eyes: Negative for pain, discharge and visual disturbance.  Respiratory: Negative for cough and shortness of breath.   Cardiovascular: Negative for chest pain, palpitations and leg swelling.  Gastrointestinal: Negative for nausea, vomiting, abdominal pain, diarrhea, constipation, blood in stool and abdominal distention.  Genitourinary: Negative for dysuria, urgency, frequency, hematuria, flank pain, vaginal bleeding, vaginal discharge, difficulty urinating, vaginal pain and pelvic pain.  Musculoskeletal: Positive for back pain, arthralgias and gait problem.  Negative for joint swelling.  Skin: Negative for rash.  Neurological: Positive for weakness. Negative for dizziness, syncope, speech difficulty, numbness and headaches.  Hematological: Negative for adenopathy.  Psychiatric/Behavioral: Positive for confusion. Negative for behavioral problems, dysphoric mood and agitation. The patient is not nervous/anxious.        Objective:   Physical Exam  Constitutional: She is oriented to person, place, and time. She appears well-developed and well-nourished.  HENT:  Head: Normocephalic.  Right Ear: External  ear normal.  Left Ear: External ear normal.  Mouth/Throat: Oropharynx is clear and moist.  Eyes: Conjunctivae and EOM are normal. Pupils are equal, round, and reactive to light.  Neck: Normal range of motion. Neck supple. No thyromegaly present.  Cardiovascular: Normal rate, regular rhythm, normal heart sounds and intact distal pulses.   Pulmonary/Chest: Effort normal and breath sounds normal.  Abdominal: Soft. Bowel sounds are normal. She exhibits no mass. There is no tenderness.  Musculoskeletal: Normal range of motion.  Lymphadenopathy:    She has no cervical adenopathy.  Neurological: She is alert and oriented to person, place, and time.  Skin: Skin is warm and dry. No rash noted.  Psychiatric: She has a normal mood and affect. Her behavior is normal.          Assessment & Plan:   Hypertension, well-controlled.  We'll continue present regimen Primary hyperparathyroidism.  Will check a serum calcium Osteoarthritis High fall risk  Will ask [facility] to evaluate and treat Encourage liberal fluid intake  Patient information concerning hypercalcemia, dispensed  Recheck 3 months

## 2014-12-25 DIAGNOSIS — I10 Essential (primary) hypertension: Secondary | ICD-10-CM | POA: Diagnosis not present

## 2014-12-25 DIAGNOSIS — E210 Primary hyperparathyroidism: Secondary | ICD-10-CM | POA: Diagnosis not present

## 2014-12-25 DIAGNOSIS — K219 Gastro-esophageal reflux disease without esophagitis: Secondary | ICD-10-CM | POA: Diagnosis not present

## 2014-12-25 DIAGNOSIS — M949 Disorder of cartilage, unspecified: Secondary | ICD-10-CM | POA: Diagnosis not present

## 2014-12-25 DIAGNOSIS — M899 Disorder of bone, unspecified: Secondary | ICD-10-CM | POA: Diagnosis not present

## 2014-12-25 DIAGNOSIS — M6281 Muscle weakness (generalized): Secondary | ICD-10-CM | POA: Diagnosis not present

## 2014-12-25 DIAGNOSIS — M1990 Unspecified osteoarthritis, unspecified site: Secondary | ICD-10-CM | POA: Diagnosis not present

## 2014-12-25 DIAGNOSIS — R269 Unspecified abnormalities of gait and mobility: Secondary | ICD-10-CM | POA: Diagnosis not present

## 2014-12-27 ENCOUNTER — Telehealth: Payer: Self-pay | Admitting: Internal Medicine

## 2014-12-27 MED ORDER — MECLIZINE HCL 25 MG PO TABS: 25.0000 mg | ORAL_TABLET | Freq: Four times a day (QID) | ORAL | Status: DC | PRN

## 2014-12-27 NOTE — Telephone Encounter (Signed)
Pt notified Rx sent to [facility].

## 2014-12-27 NOTE — Telephone Encounter (Signed)
Pt is requesting that her RX be sent to [place]   meclizine (ANTIVERT) 25 MG tablet      Fax number [phone]

## 2014-12-29 DIAGNOSIS — R269 Unspecified abnormalities of gait and mobility: Secondary | ICD-10-CM | POA: Diagnosis not present

## 2014-12-29 DIAGNOSIS — M6281 Muscle weakness (generalized): Secondary | ICD-10-CM | POA: Diagnosis not present

## 2014-12-29 DIAGNOSIS — E210 Primary hyperparathyroidism: Secondary | ICD-10-CM | POA: Diagnosis not present

## 2014-12-29 DIAGNOSIS — M899 Disorder of bone, unspecified: Secondary | ICD-10-CM | POA: Diagnosis not present

## 2014-12-29 DIAGNOSIS — M949 Disorder of cartilage, unspecified: Secondary | ICD-10-CM | POA: Diagnosis not present

## 2014-12-29 DIAGNOSIS — M1990 Unspecified osteoarthritis, unspecified site: Secondary | ICD-10-CM | POA: Diagnosis not present

## 2014-12-29 DIAGNOSIS — I10 Essential (primary) hypertension: Secondary | ICD-10-CM | POA: Diagnosis not present

## 2014-12-29 DIAGNOSIS — K219 Gastro-esophageal reflux disease without esophagitis: Secondary | ICD-10-CM | POA: Diagnosis not present

## 2014-12-31 DIAGNOSIS — M6281 Muscle weakness (generalized): Secondary | ICD-10-CM | POA: Diagnosis not present

## 2014-12-31 DIAGNOSIS — M899 Disorder of bone, unspecified: Secondary | ICD-10-CM | POA: Diagnosis not present

## 2014-12-31 DIAGNOSIS — M949 Disorder of cartilage, unspecified: Secondary | ICD-10-CM | POA: Diagnosis not present

## 2014-12-31 DIAGNOSIS — K219 Gastro-esophageal reflux disease without esophagitis: Secondary | ICD-10-CM | POA: Diagnosis not present

## 2014-12-31 DIAGNOSIS — M1990 Unspecified osteoarthritis, unspecified site: Secondary | ICD-10-CM | POA: Diagnosis not present

## 2014-12-31 DIAGNOSIS — R269 Unspecified abnormalities of gait and mobility: Secondary | ICD-10-CM | POA: Diagnosis not present

## 2014-12-31 DIAGNOSIS — I10 Essential (primary) hypertension: Secondary | ICD-10-CM | POA: Diagnosis not present

## 2014-12-31 DIAGNOSIS — E210 Primary hyperparathyroidism: Secondary | ICD-10-CM | POA: Diagnosis not present

## 2015-01-01 DIAGNOSIS — E210 Primary hyperparathyroidism: Secondary | ICD-10-CM | POA: Diagnosis not present

## 2015-01-01 DIAGNOSIS — I10 Essential (primary) hypertension: Secondary | ICD-10-CM | POA: Diagnosis not present

## 2015-01-01 DIAGNOSIS — K219 Gastro-esophageal reflux disease without esophagitis: Secondary | ICD-10-CM | POA: Diagnosis not present

## 2015-01-01 DIAGNOSIS — M1990 Unspecified osteoarthritis, unspecified site: Secondary | ICD-10-CM | POA: Diagnosis not present

## 2015-01-01 DIAGNOSIS — M949 Disorder of cartilage, unspecified: Secondary | ICD-10-CM | POA: Diagnosis not present

## 2015-01-01 DIAGNOSIS — R269 Unspecified abnormalities of gait and mobility: Secondary | ICD-10-CM | POA: Diagnosis not present

## 2015-01-01 DIAGNOSIS — M6281 Muscle weakness (generalized): Secondary | ICD-10-CM | POA: Diagnosis not present

## 2015-01-01 DIAGNOSIS — M899 Disorder of bone, unspecified: Secondary | ICD-10-CM | POA: Diagnosis not present

## 2015-01-04 ENCOUNTER — Telehealth: Payer: Self-pay | Admitting: Internal Medicine

## 2015-01-04 DIAGNOSIS — M1990 Unspecified osteoarthritis, unspecified site: Secondary | ICD-10-CM | POA: Diagnosis not present

## 2015-01-04 DIAGNOSIS — I10 Essential (primary) hypertension: Secondary | ICD-10-CM | POA: Diagnosis not present

## 2015-01-04 DIAGNOSIS — R269 Unspecified abnormalities of gait and mobility: Secondary | ICD-10-CM | POA: Diagnosis not present

## 2015-01-04 DIAGNOSIS — K219 Gastro-esophageal reflux disease without esophagitis: Secondary | ICD-10-CM | POA: Diagnosis not present

## 2015-01-04 DIAGNOSIS — E210 Primary hyperparathyroidism: Secondary | ICD-10-CM | POA: Diagnosis not present

## 2015-01-04 DIAGNOSIS — M6281 Muscle weakness (generalized): Secondary | ICD-10-CM | POA: Diagnosis not present

## 2015-01-04 DIAGNOSIS — M949 Disorder of cartilage, unspecified: Secondary | ICD-10-CM | POA: Diagnosis not present

## 2015-01-04 DIAGNOSIS — M899 Disorder of bone, unspecified: Secondary | ICD-10-CM | POA: Diagnosis not present

## 2015-01-04 NOTE — Telephone Encounter (Signed)
Pt is sch for tomorrow

## 2015-01-04 NOTE — Telephone Encounter (Signed)
Needs office visit.

## 2015-01-04 NOTE — Telephone Encounter (Signed)
Please call pt and schedule office visit per Dr. Raliegh Ip.

## 2015-01-04 NOTE — Telephone Encounter (Signed)
Noted  

## 2015-01-04 NOTE — Telephone Encounter (Signed)
Pt is having wheezing and sob and pt said wheezing go away in afternoon and sob is not all the time. Pt oxygen level is 99%. Temp 96.5. Please advise

## 2015-01-05 ENCOUNTER — Ambulatory Visit (INDEPENDENT_AMBULATORY_CARE_PROVIDER_SITE_OTHER): Payer: Commercial Managed Care - HMO | Admitting: Internal Medicine

## 2015-01-05 ENCOUNTER — Encounter: Payer: Self-pay | Admitting: Internal Medicine

## 2015-01-05 VITALS — BP 150/90 | HR 70 | Temp 97.7°F | Resp 20 | Ht 62.5 in | Wt 179.0 lb

## 2015-01-05 DIAGNOSIS — I10 Essential (primary) hypertension: Secondary | ICD-10-CM

## 2015-01-05 DIAGNOSIS — E210 Primary hyperparathyroidism: Secondary | ICD-10-CM | POA: Diagnosis not present

## 2015-01-05 LAB — COMPREHENSIVE METABOLIC PANEL
ALK PHOS: 76 U/L (ref 39–117)
ALT: 11 U/L (ref 0–35)
AST: 17 U/L (ref 0–37)
Albumin: 4.3 g/dL (ref 3.5–5.2)
BUN: 12 mg/dL (ref 6–23)
CO2: 25 mEq/L (ref 19–32)
Calcium: 11.4 mg/dL — ABNORMAL HIGH (ref 8.4–10.5)
Chloride: 97 mEq/L (ref 96–112)
Creatinine, Ser: 0.88 mg/dL (ref 0.40–1.20)
GFR: 65.35 mL/min (ref >60.00)
Glucose, Bld: 101 mg/dL — ABNORMAL HIGH (ref 70–99)
POTASSIUM: 4.8 meq/L (ref 3.5–5.1)
SODIUM: 128 meq/L — AB (ref 135–145)
TOTAL PROTEIN: 7 g/dL (ref 6.0–8.3)
Total Bilirubin: 0.6 mg/dL (ref 0.2–1.2)

## 2015-01-05 MED ORDER — LORAZEPAM 0.5 MG PO TABS: 0.5000 mg | ORAL_TABLET | Freq: Two times a day (BID) | ORAL | Status: DC | PRN

## 2015-01-05 NOTE — Progress Notes (Signed)
Subjective:    Patient ID: Annette [name], female    DOB: 23-Apr-1930, 81 y.o.   MRN: [id number]  HPI  81 year old patient who has a history of primary hyperparathyroidism.  Her calcium levels over the years have been fairly stable.  She has declined consideration of surgery.  She has been resistant to have a bone density study performed.  She was seen recently and calcium level.  Stable at 11 point 0, but serum sodium was 1:30.  She had been on furosemide.  This has been discontinued.  At the time she was on Tums and Rolaids for GI upset as well.  These medications have been discontinued She was seen yesterday by a visiting nurse and noted to have some mild wheezing.  .  She states that this has been stable and present for a number of years.  She has mild wheezing with exertion that has not really changed.  Today she feels well.  She does have treated hypertension She has been under considerable stress due to her present living arrangements.  She gave her son.  Her home and she now resides with her son and new daughter-in-law.  This has been quite stressful  Past Medical History  Diagnosis Date  . GERD 10/03/2007  . HYPERTENSION 10/03/2007  . OSTEOPENIA 02/03/2008  . Primary hyperparathyroidism 10/03/2007    History   Social History  . Marital Status: Married    Spouse Name: N/A  . Number of Children: N/A  . Years of Education: N/A   Occupational History  . Not on file.   Social History Main Topics  . Smoking status: Former Smoker    Quit date: 11/12/1978  . Smokeless tobacco: Never Used  . Alcohol Use: No  . Drug Use: No  . Sexual Activity: Not on file   Other Topics Concern  . Not on file   Social History Narrative    Past Surgical History  Procedure Laterality Date  . Abdominal hysterectomy      No family history on file.  No Known Allergies  Current Outpatient Prescriptions on File Prior to Visit  Medication Sig Dispense Refill  . amLODipine (NORVASC) 2.5  MG tablet Take 1 tablet (2.5 mg total) by mouth daily with breakfast. 90 tablet 1  . aspirin 81 MG chewable tablet Chew 81 mg by mouth daily.    . benazepril (LOTENSIN) 40 MG tablet TAKE ONE TABLET BY MOUTH ONCE DAILY 90 tablet 1  . ibuprofen (ADVIL,MOTRIN) 200 MG tablet Take 2 tablets (400 mg total) by mouth daily as needed for moderate pain. 180 tablet 1  . latanoprost (XALATAN) 0.005 % ophthalmic solution Place 1 drop into both eyes at bedtime.     . meclizine (ANTIVERT) 25 MG tablet Take 1 tablet (25 mg total) by mouth 4 (four) times daily as needed. 90 tablet 3  . omeprazole (PRILOSEC) 20 MG capsule TAKE ONE CAPSULE BY MOUTH ONCE DAILY 90 capsule 3   No current facility-administered medications on file prior to visit.    BP 150/90 mmHg  Pulse 70  Temp(Src) 97.7 F (36.5 C) (Oral)  Resp 20  Ht 5' 2.5" (1.588 m)  Wt 179 lb (81.194 kg)  BMI 32.20 kg/m2  SpO2 94%     Review of Systems  Constitutional: Negative.   HENT: Negative for congestion, dental problem, hearing loss, rhinorrhea, sinus pressure, sore throat and tinnitus.   Eyes: Negative for pain, discharge and visual disturbance.  Respiratory: Positive for shortness of breath. Negative  for cough.   Cardiovascular: Negative for chest pain, palpitations and leg swelling.  Gastrointestinal: Negative for nausea, vomiting, abdominal pain, diarrhea, constipation, blood in stool and abdominal distention.  Genitourinary: Negative for dysuria, urgency, frequency, hematuria, flank pain, vaginal bleeding, vaginal discharge, difficulty urinating, vaginal pain and pelvic pain.  Musculoskeletal: Negative for joint swelling, arthralgias and gait problem.  Skin: Negative for rash.  Neurological: Negative for dizziness, syncope, speech difficulty, weakness, numbness and headaches.  Hematological: Negative for adenopathy.  Psychiatric/Behavioral: Negative for behavioral problems, dysphoric mood and agitation. The patient is not  nervous/anxious.        Objective:   Physical Exam  Constitutional: She is oriented to person, place, and time. She appears well-developed and well-nourished. No distress.  Walks with a cane.  Blood pressure normal  HENT:  Head: Normocephalic.  Right Ear: External ear normal.  Left Ear: External ear normal.  Mouth/Throat: Oropharynx is clear and moist.  Eyes: Conjunctivae and EOM are normal. Pupils are equal, round, and reactive to light.  Neck: Normal range of motion. Neck supple. No thyromegaly present.  Cardiovascular: Normal rate, regular rhythm, normal heart sounds and intact distal pulses.   Pulmonary/Chest: Effort normal and breath sounds normal. No respiratory distress. She has no rales.  Abdominal: Soft. Bowel sounds are normal. She exhibits no mass. There is no tenderness.  Musculoskeletal: Normal range of motion.  Lymphadenopathy:    She has no cervical adenopathy.  Neurological: She is alert and oriented to person, place, and time.  Skin: Skin is warm and dry. No rash noted.  Psychiatric: She has a normal mood and affect. Her behavior is normal.          Assessment & Plan:   Episodic wheezing.  This appears to be paroxysmal and stable.  No wheezing noted on examination today Hypertension, well-controlled off furosemide History of hyponatremia.  Will check follow-up lab Hypercalcemia.  Calcium supplements such as Tums have been discontinued.  We'll check a bone density Situational anxiety.  Will place on when necessary alprazolam Recheck 6 months

## 2015-01-05 NOTE — Patient Instructions (Signed)
Limit your sodium (Salt) intake  Please check your blood pressure on a regular basis.  If it is consistently greater than 150/90, please make an office appointment.  Return in 6 months for follow-up   

## 2015-01-05 NOTE — Progress Notes (Signed)
Pre visit review using our clinic review tool, if applicable. No additional management support is needed unless otherwise documented below in the visit note. 

## 2015-01-06 DIAGNOSIS — M899 Disorder of bone, unspecified: Secondary | ICD-10-CM | POA: Diagnosis not present

## 2015-01-06 DIAGNOSIS — M1990 Unspecified osteoarthritis, unspecified site: Secondary | ICD-10-CM | POA: Diagnosis not present

## 2015-01-06 DIAGNOSIS — M6281 Muscle weakness (generalized): Secondary | ICD-10-CM | POA: Diagnosis not present

## 2015-01-06 DIAGNOSIS — E210 Primary hyperparathyroidism: Secondary | ICD-10-CM | POA: Diagnosis not present

## 2015-01-06 DIAGNOSIS — M949 Disorder of cartilage, unspecified: Secondary | ICD-10-CM | POA: Diagnosis not present

## 2015-01-06 DIAGNOSIS — I10 Essential (primary) hypertension: Secondary | ICD-10-CM | POA: Diagnosis not present

## 2015-01-06 DIAGNOSIS — R269 Unspecified abnormalities of gait and mobility: Secondary | ICD-10-CM | POA: Diagnosis not present

## 2015-01-06 DIAGNOSIS — K219 Gastro-esophageal reflux disease without esophagitis: Secondary | ICD-10-CM | POA: Diagnosis not present

## 2015-01-11 DIAGNOSIS — M1990 Unspecified osteoarthritis, unspecified site: Secondary | ICD-10-CM | POA: Diagnosis not present

## 2015-01-11 DIAGNOSIS — K219 Gastro-esophageal reflux disease without esophagitis: Secondary | ICD-10-CM | POA: Diagnosis not present

## 2015-01-11 DIAGNOSIS — M949 Disorder of cartilage, unspecified: Secondary | ICD-10-CM | POA: Diagnosis not present

## 2015-01-11 DIAGNOSIS — R269 Unspecified abnormalities of gait and mobility: Secondary | ICD-10-CM | POA: Diagnosis not present

## 2015-01-11 DIAGNOSIS — I10 Essential (primary) hypertension: Secondary | ICD-10-CM | POA: Diagnosis not present

## 2015-01-11 DIAGNOSIS — M899 Disorder of bone, unspecified: Secondary | ICD-10-CM | POA: Diagnosis not present

## 2015-01-11 DIAGNOSIS — E210 Primary hyperparathyroidism: Secondary | ICD-10-CM | POA: Diagnosis not present

## 2015-01-11 DIAGNOSIS — M6281 Muscle weakness (generalized): Secondary | ICD-10-CM | POA: Diagnosis not present

## 2015-01-13 ENCOUNTER — Telehealth: Payer: Self-pay | Admitting: Internal Medicine

## 2015-01-13 DIAGNOSIS — M1990 Unspecified osteoarthritis, unspecified site: Secondary | ICD-10-CM | POA: Diagnosis not present

## 2015-01-13 DIAGNOSIS — M899 Disorder of bone, unspecified: Secondary | ICD-10-CM | POA: Diagnosis not present

## 2015-01-13 DIAGNOSIS — R269 Unspecified abnormalities of gait and mobility: Secondary | ICD-10-CM | POA: Diagnosis not present

## 2015-01-13 DIAGNOSIS — E210 Primary hyperparathyroidism: Secondary | ICD-10-CM | POA: Diagnosis not present

## 2015-01-13 DIAGNOSIS — K219 Gastro-esophageal reflux disease without esophagitis: Secondary | ICD-10-CM | POA: Diagnosis not present

## 2015-01-13 DIAGNOSIS — I10 Essential (primary) hypertension: Secondary | ICD-10-CM | POA: Diagnosis not present

## 2015-01-13 DIAGNOSIS — M6281 Muscle weakness (generalized): Secondary | ICD-10-CM | POA: Diagnosis not present

## 2015-01-13 DIAGNOSIS — M949 Disorder of cartilage, unspecified: Secondary | ICD-10-CM | POA: Diagnosis not present

## 2015-01-13 NOTE — Telephone Encounter (Signed)
FYI

## 2015-01-13 NOTE — Telephone Encounter (Signed)
[name] call [facility] to report that pt had a fall right knee is bruise and swollen. Still able to walk not complaining of pain. She did have her cane but had on shoes that was to big. Family working on getting shoes that fit.Annette [name]

## 2015-01-17 ENCOUNTER — Ambulatory Visit: Payer: Medicare Other | Admitting: Internal Medicine

## 2015-01-18 ENCOUNTER — Other Ambulatory Visit: Payer: Self-pay

## 2015-01-18 ENCOUNTER — Telehealth: Payer: Self-pay

## 2015-01-18 MED ORDER — BENAZEPRIL HCL 40 MG PO TABS: ORAL_TABLET | ORAL | Status: DC

## 2015-01-18 MED ORDER — AMLODIPINE BESYLATE 2.5 MG PO TABS: 2.5000 mg | ORAL_TABLET | Freq: Every day | ORAL | Status: DC

## 2015-01-18 NOTE — Telephone Encounter (Signed)
Okay to refill furosemide

## 2015-01-18 NOTE — Telephone Encounter (Signed)
Rx request from [facility] for:   Amlodipine besylate 2.5 mg tablet-take 1 tablet by mouth daily  #90 Benazepril HCL 40 mg tablet-Take 1 tablet by mouth daily #90 Furosemide 40 mg tablet #90  Dr. Raliegh Ip please clarify if pt should continue Furosemide.  Thanks

## 2015-01-19 ENCOUNTER — Other Ambulatory Visit: Payer: Self-pay | Admitting: *Deleted

## 2015-01-19 DIAGNOSIS — M899 Disorder of bone, unspecified: Secondary | ICD-10-CM | POA: Diagnosis not present

## 2015-01-19 DIAGNOSIS — M1990 Unspecified osteoarthritis, unspecified site: Secondary | ICD-10-CM | POA: Diagnosis not present

## 2015-01-19 DIAGNOSIS — M6281 Muscle weakness (generalized): Secondary | ICD-10-CM | POA: Diagnosis not present

## 2015-01-19 DIAGNOSIS — I10 Essential (primary) hypertension: Secondary | ICD-10-CM | POA: Diagnosis not present

## 2015-01-19 DIAGNOSIS — K219 Gastro-esophageal reflux disease without esophagitis: Secondary | ICD-10-CM | POA: Diagnosis not present

## 2015-01-19 DIAGNOSIS — M949 Disorder of cartilage, unspecified: Secondary | ICD-10-CM | POA: Diagnosis not present

## 2015-01-19 DIAGNOSIS — E210 Primary hyperparathyroidism: Secondary | ICD-10-CM | POA: Diagnosis not present

## 2015-01-19 DIAGNOSIS — R269 Unspecified abnormalities of gait and mobility: Secondary | ICD-10-CM | POA: Diagnosis not present

## 2015-01-19 MED ORDER — AMLODIPINE BESYLATE 2.5 MG PO TABS: 2.5000 mg | ORAL_TABLET | Freq: Every day | ORAL | Status: DC

## 2015-01-19 MED ORDER — FUROSEMIDE 40 MG PO TABS: 40.0000 mg | ORAL_TABLET | Freq: Every day | ORAL | Status: DC

## 2015-01-19 MED ORDER — OMEPRAZOLE 20 MG PO CPDR: DELAYED_RELEASE_CAPSULE | ORAL | Status: DC

## 2015-01-19 MED ORDER — BENAZEPRIL HCL 40 MG PO TABS: ORAL_TABLET | ORAL | Status: DC

## 2015-01-19 MED ORDER — IBUPROFEN 200 MG PO TABS: 400.0000 mg | ORAL_TABLET | Freq: Every day | ORAL | Status: DC | PRN

## 2015-01-19 NOTE — Telephone Encounter (Signed)
Prescriptions sent to pharmacy

## 2015-02-01 ENCOUNTER — Ambulatory Visit: Payer: Medicare Other | Admitting: Internal Medicine

## 2015-02-02 DIAGNOSIS — I10 Essential (primary) hypertension: Secondary | ICD-10-CM | POA: Diagnosis not present

## 2015-02-02 DIAGNOSIS — E210 Primary hyperparathyroidism: Secondary | ICD-10-CM | POA: Diagnosis not present

## 2015-02-02 DIAGNOSIS — K219 Gastro-esophageal reflux disease without esophagitis: Secondary | ICD-10-CM | POA: Diagnosis not present

## 2015-02-02 DIAGNOSIS — M6281 Muscle weakness (generalized): Secondary | ICD-10-CM | POA: Diagnosis not present

## 2015-02-02 DIAGNOSIS — M1990 Unspecified osteoarthritis, unspecified site: Secondary | ICD-10-CM | POA: Diagnosis not present

## 2015-02-02 DIAGNOSIS — M899 Disorder of bone, unspecified: Secondary | ICD-10-CM | POA: Diagnosis not present

## 2015-02-02 DIAGNOSIS — R269 Unspecified abnormalities of gait and mobility: Secondary | ICD-10-CM | POA: Diagnosis not present

## 2015-02-02 DIAGNOSIS — M949 Disorder of cartilage, unspecified: Secondary | ICD-10-CM | POA: Diagnosis not present

## 2015-02-24 ENCOUNTER — Other Ambulatory Visit: Payer: Self-pay | Admitting: Internal Medicine

## 2015-03-28 ENCOUNTER — Other Ambulatory Visit: Payer: Self-pay | Admitting: Internal Medicine

## 2015-04-15 DIAGNOSIS — H538 Other visual disturbances: Secondary | ICD-10-CM | POA: Diagnosis not present

## 2015-04-15 DIAGNOSIS — R6889 Other general symptoms and signs: Secondary | ICD-10-CM | POA: Diagnosis not present

## 2015-04-15 DIAGNOSIS — H4011X2 Primary open-angle glaucoma, moderate stage: Secondary | ICD-10-CM | POA: Diagnosis not present

## 2015-06-28 ENCOUNTER — Other Ambulatory Visit: Payer: Self-pay | Admitting: Internal Medicine

## 2015-07-05 ENCOUNTER — Ambulatory Visit (INDEPENDENT_AMBULATORY_CARE_PROVIDER_SITE_OTHER): Payer: Commercial Managed Care - HMO | Admitting: Internal Medicine

## 2015-07-05 ENCOUNTER — Encounter: Payer: Self-pay | Admitting: Internal Medicine

## 2015-07-05 VITALS — BP 126/70 | HR 82 | Temp 97.6°F | Resp 20 | Ht 62.5 in | Wt 171.0 lb

## 2015-07-05 DIAGNOSIS — I10 Essential (primary) hypertension: Secondary | ICD-10-CM | POA: Diagnosis not present

## 2015-07-05 DIAGNOSIS — E210 Primary hyperparathyroidism: Secondary | ICD-10-CM

## 2015-07-05 DIAGNOSIS — K219 Gastro-esophageal reflux disease without esophagitis: Secondary | ICD-10-CM

## 2015-07-05 DIAGNOSIS — R6889 Other general symptoms and signs: Secondary | ICD-10-CM | POA: Diagnosis not present

## 2015-07-05 LAB — COMPREHENSIVE METABOLIC PANEL
ALBUMIN: 4.3 g/dL (ref 3.5–5.2)
ALT: 13 U/L (ref 0–35)
AST: 17 U/L (ref 0–37)
Alkaline Phosphatase: 87 U/L (ref 39–117)
BUN: 19 mg/dL (ref 6–23)
CALCIUM: 11.5 mg/dL — AB (ref 8.4–10.5)
CHLORIDE: 97 meq/L (ref 96–112)
CO2: 29 meq/L (ref 19–32)
Creatinine, Ser: 1.12 mg/dL (ref 0.40–1.20)
GFR: 49.42 mL/min — AB (ref >60.00)
Glucose, Bld: 103 mg/dL — ABNORMAL HIGH (ref 70–99)
POTASSIUM: 4.8 meq/L (ref 3.5–5.1)
Sodium: 133 mEq/L — ABNORMAL LOW (ref 135–145)
Total Bilirubin: 0.7 mg/dL (ref 0.2–1.2)
Total Protein: 7.1 g/dL (ref 6.0–8.3)

## 2015-07-05 NOTE — Progress Notes (Signed)
Pre visit review using our clinic review tool, if applicable. No additional management support is needed unless otherwise documented below in the visit note. 

## 2015-07-05 NOTE — Patient Instructions (Signed)
Bone density study as discussed  Limit your sodium (Salt) intake    It is important that you exercise regularly, at least 20 minutes 3 to 4 times per week.  If you develop chest pain or shortness of breath seek  medical attention.  Return in 6 months for follow-up

## 2015-07-05 NOTE — Progress Notes (Signed)
Subjective:    Patient ID: Annette [name], female    DOB: 02-Mar-1930, 81 y.o.   MRN: [id number]  HPI  81 year old patient who is seen today for her biannual follow-up.  She has a history of hypertension that has been well-controlled.  No concerns or complaints today. She has had a recent ophthalmology examination.  She does have a history of glaucoma that has been well-controlled.  Past Medical History  Diagnosis Date  . GERD 10/03/2007  . HYPERTENSION 10/03/2007  . OSTEOPENIA 02/03/2008  . Primary hyperparathyroidism 10/03/2007    Social History   Social History  . Marital Status: Married    Spouse Name: N/A  . Number of Children: N/A  . Years of Education: N/A   Occupational History  . Not on file.   Social History Main Topics  . Smoking status: Former Smoker    Quit date: 11/12/1978  . Smokeless tobacco: Never Used  . Alcohol Use: No  . Drug Use: No  . Sexual Activity: Not on file   Other Topics Concern  . Not on file   Social History Narrative    Past Surgical History  Procedure Laterality Date  . Abdominal hysterectomy      No family history on file.  No Known Allergies  Current Outpatient Prescriptions on File Prior to Visit  Medication Sig Dispense Refill  . amLODipine (NORVASC) 2.5 MG tablet TAKE 1 TABLET (2.5 MG TOTAL) BY MOUTH DAILY WITH BREAKFAST. 90 tablet 1  . aspirin 81 MG chewable tablet Chew 81 mg by mouth daily.    . benazepril (LOTENSIN) 40 MG tablet TAKE ONE TABLET BY MOUTH ONCE DAILY 90 tablet 1  . furosemide (LASIX) 40 MG tablet TAKE 1 TABLET DAILY AS NEEDED FOR FLUID. 90 tablet 1  . ibuprofen (ADVIL,MOTRIN) 200 MG tablet Take 2 tablets (400 mg total) by mouth daily as needed for moderate pain. 180 tablet 1  . latanoprost (XALATAN) 0.005 % ophthalmic solution Place 1 drop into both eyes at bedtime.     Marland Kitchen LORazepam (ATIVAN) 0.5 MG tablet TAKE ONE TABLET BY MOUTH TWICE DAILY AS NEEDED FOR ANXIETY 60 tablet 1  . meclizine (ANTIVERT) 25  MG tablet Take 1 tablet (25 mg total) by mouth 4 (four) times daily as needed. 90 tablet 3  . omeprazole (PRILOSEC) 20 MG capsule TAKE ONE CAPSULE BY MOUTH ONCE DAILY 90 capsule 3   No current facility-administered medications on file prior to visit.    BP 126/70 mmHg  Pulse 82  Temp(Src) 97.6 F (36.4 C) (Oral)  Resp 20  Ht 5' 2.5" (1.588 m)  Wt 171 lb (77.565 kg)  BMI 30.76 kg/m2  SpO2 98%     Review of Systems  Constitutional: Negative.   HENT: Negative for congestion, dental problem, hearing loss, rhinorrhea, sinus pressure, sore throat and tinnitus.   Eyes: Negative for pain, discharge and visual disturbance.  Respiratory: Negative for cough and shortness of breath.   Cardiovascular: Negative for chest pain, palpitations and leg swelling.  Gastrointestinal: Negative for nausea, vomiting, abdominal pain, diarrhea, constipation, blood in stool and abdominal distention.  Genitourinary: Negative for dysuria, urgency, frequency, hematuria, flank pain, vaginal bleeding, vaginal discharge, difficulty urinating, vaginal pain and pelvic pain.  Musculoskeletal: Negative for joint swelling, arthralgias and gait problem.  Skin: Negative for rash.  Neurological: Negative for dizziness, syncope, speech difficulty, weakness, numbness and headaches.  Hematological: Negative for adenopathy.  Psychiatric/Behavioral: Negative for behavioral problems, dysphoric mood and agitation. The patient is not  nervous/anxious.        Objective:   Physical Exam  Constitutional: She is oriented to person, place, and time. She appears well-developed and well-nourished.  Blood pressure 120/70  HENT:  Head: Normocephalic.  Right Ear: External ear normal.  Left Ear: External ear normal.  Mouth/Throat: Oropharynx is clear and moist.  Eyes: Conjunctivae and EOM are normal. Pupils are equal, round, and reactive to light.  Neck: Normal range of motion. Neck supple. No thyromegaly present.  Cardiovascular:  Normal rate, regular rhythm, normal heart sounds and intact distal pulses.   Pulmonary/Chest: Effort normal and breath sounds normal.  Abdominal: Soft. Bowel sounds are normal. She exhibits no mass. There is no tenderness.  Musculoskeletal: Normal range of motion.  Prominent varicosities but no edema  Lymphadenopathy:    She has no cervical adenopathy.  Neurological: She is alert and oriented to person, place, and time.  Skin: Skin is warm and dry. No rash noted.  Psychiatric: She has a normal mood and affect. Her behavior is normal.          Assessment & Plan:   Hypertension, well-controlled Anxiety disorder,  history of primary hyperparathyroidism.  We'll check a serum calcium.  We'll also obtain a bone density study  No change in medical regimen  CPX 6 months

## 2015-07-13 ENCOUNTER — Other Ambulatory Visit: Payer: Self-pay | Admitting: Internal Medicine

## 2015-08-23 ENCOUNTER — Other Ambulatory Visit: Payer: Self-pay | Admitting: Internal Medicine

## 2015-09-27 ENCOUNTER — Other Ambulatory Visit: Payer: Self-pay | Admitting: Internal Medicine

## 2015-09-28 ENCOUNTER — Other Ambulatory Visit: Payer: Self-pay | Admitting: Internal Medicine

## 2015-09-28 ENCOUNTER — Telehealth: Payer: Self-pay | Admitting: Internal Medicine

## 2015-09-28 NOTE — Telephone Encounter (Signed)
Pt needs refill on lorazepam call into [facility]

## 2015-09-28 NOTE — Telephone Encounter (Signed)
Pt notified Rx was called into pharmacy this morning. Pt verbalized understanding.

## 2015-10-20 DIAGNOSIS — H21531 Iridodialysis, right eye: Secondary | ICD-10-CM | POA: Diagnosis not present

## 2015-10-20 DIAGNOSIS — H401131 Primary open-angle glaucoma, bilateral, mild stage: Secondary | ICD-10-CM | POA: Diagnosis not present

## 2015-10-20 DIAGNOSIS — R6889 Other general symptoms and signs: Secondary | ICD-10-CM | POA: Diagnosis not present

## 2015-10-25 ENCOUNTER — Other Ambulatory Visit: Payer: Self-pay | Admitting: Internal Medicine

## 2015-12-27 ENCOUNTER — Telehealth: Payer: Self-pay | Admitting: Internal Medicine

## 2015-12-27 MED ORDER — LORAZEPAM 0.5 MG PO TABS: 0.5000 mg | ORAL_TABLET | Freq: Two times a day (BID) | ORAL | Status: DC | PRN

## 2015-12-27 NOTE — Telephone Encounter (Signed)
Pt notified Rx called into pharmacy. Pt verbalized understanding.  

## 2015-12-27 NOTE — Telephone Encounter (Signed)
Pt request refill  LORazepam (ATIVAN) 0.5 MG tablet  Pt states she is out. [facility]

## 2016-01-02 ENCOUNTER — Ambulatory Visit (INDEPENDENT_AMBULATORY_CARE_PROVIDER_SITE_OTHER): Payer: Commercial Managed Care - HMO | Admitting: Internal Medicine

## 2016-01-02 ENCOUNTER — Encounter: Payer: Self-pay | Admitting: Internal Medicine

## 2016-01-02 VITALS — BP 140/80 | HR 79 | Temp 97.9°F | Resp 20 | Ht 62.5 in | Wt 166.0 lb

## 2016-01-02 DIAGNOSIS — M949 Disorder of cartilage, unspecified: Secondary | ICD-10-CM | POA: Diagnosis not present

## 2016-01-02 DIAGNOSIS — E210 Primary hyperparathyroidism: Secondary | ICD-10-CM | POA: Diagnosis not present

## 2016-01-02 DIAGNOSIS — R6889 Other general symptoms and signs: Secondary | ICD-10-CM | POA: Diagnosis not present

## 2016-01-02 DIAGNOSIS — I10 Essential (primary) hypertension: Secondary | ICD-10-CM

## 2016-01-02 DIAGNOSIS — M899 Disorder of bone, unspecified: Secondary | ICD-10-CM

## 2016-01-02 LAB — COMPREHENSIVE METABOLIC PANEL
ALBUMIN: 4.2 g/dL (ref 3.5–5.2)
ALK PHOS: 82 U/L (ref 39–117)
ALT: 9 U/L (ref 0–35)
AST: 14 U/L (ref 0–37)
BILIRUBIN TOTAL: 0.8 mg/dL (ref 0.2–1.2)
BUN: 22 mg/dL (ref 6–23)
CO2: 27 mEq/L (ref 19–32)
Calcium: 11.3 mg/dL — ABNORMAL HIGH (ref 8.4–10.5)
Chloride: 102 mEq/L (ref 96–112)
Creatinine, Ser: 1.04 mg/dL (ref 0.40–1.20)
GFR: 53.76 mL/min — AB (ref >60.00)
GLUCOSE: 85 mg/dL (ref 70–99)
POTASSIUM: 4.2 meq/L (ref 3.5–5.1)
Sodium: 137 mEq/L (ref 135–145)
TOTAL PROTEIN: 6.4 g/dL (ref 6.0–8.3)

## 2016-01-02 NOTE — Progress Notes (Signed)
Pre visit review using our clinic review tool, if applicable. No additional management support is needed unless otherwise documented below in the visit note. 

## 2016-01-02 NOTE — Patient Instructions (Signed)
Limit your sodium (Salt) intake  Bone density study as discussed    It is important that you exercise regularly, at least 20 minutes 3 to 4 times per week.  If you develop chest pain or shortness of breath seek  medical attention.  Hypercalcemia Hypercalcemia is having too much calcium in the blood. The body needs calcium to make bones and keep them strong. Calcium also helps the muscles, nerves, brain, and heart work the way they should. Most of the calcium in the body is in the bones. There is also some calcium in the blood. Hypercalcemia can happen when calcium comes out of the bones, or when the kidneys are not able to remove calcium from the blood. Hypercalcemia can be mild or severe. CAUSES There are many possible causes of hypercalcemia. Common causes include:  Hyperparathyroidism. This is a condition in which the body produces too much parathyroid hormone. There are four parathyroid glands in your neck. These glands produce a chemical messenger (hormone) that helps the body absorb calcium from foods and helps your bones release calcium.  Certain kinds of cancer, such as lung cancer, breast cancer, or myeloma. Less common causes of hypercalcemia include:   Getting too much calcium or vitamin D from your diet.  Kidney failure.  Hyperthyroidism.  Being on bed rest for a long time.  Certain medicines.  Infections.  Sarcoidosis. RISK FACTORS This condition is more likely to develop in:  Women.  People who are 60 years or older.  People who have a family history of hypercalcemia. SYMPTOMS  Mild hypercalcemia that starts slowly may not cause symptoms. Severe, sudden hypercalcemia is more likely to cause symptoms, such as:  Loss of appetite.  Increased thirst and frequent urination.  Fatigue.  Nausea and vomiting.  Headache.  Abdominal pain.  Muscle pain, twitching, or weakness.  Constipation.  Blood in the urine.  Pain in the side of the back (flank  pain).  Anxiety, confusion, or depression.  Irregular heartbeat (arrhythmia).  Loss of consciousness. DIAGNOSIS  This condition may be diagnosed based on:   Your symptoms.  Blood tests.  Urine tests.  X-rays.  Ultrasound.  MRI.  CT scan. TREATMENT  Treatment for hypercalcemia depends on the cause. Treatment may include:  Receiving fluids through an IV tube.  Medicines that keep calcium levels steady after receiving fluids (loop diuretics).  Medicines that keep calcium in your bones (bisphosphonates).  Medicines that lower the calcium level in your blood.  Surgery to remove overactive parathyroid glands. HOME CARE INSTRUCTIONS  Take over-the-counter and prescription medicines only as told by your health care provider.  Follow instructions from your health care provider about eating or drinking restrictions.  Drink enough fluid to keep your urine clear or pale yellow.  Stay active. Weight-bearing exercise helps to keep calcium in your bones. Follow instructions from your health care provider about what type and level of exercise is safe for you.  Keep all follow-up visits as told by your health care provider. This is important. SEEK MEDICAL CARE IF:  You have a fever.  You have flank or abdominal pain that is getting worse. SEEK IMMEDIATE MEDICAL CARE IF:   You have severe abdominal or flank pain.  You have chest pain.  You have trouble breathing.  You become very confused and sleepy.  You lose consciousness.   This information is not intended to replace advice given to you by your health care provider. Make sure you discuss any questions you have with your  health care provider.   Document Released: 01/12/2005 Document Revised: 07/20/2015 Document Reviewed: 03/16/2015 Elsevier Interactive Patient Education [employer].

## 2016-01-02 NOTE — Progress Notes (Signed)
Subjective:    Patient ID: Annette [name], female    DOB: [birth year], 82 y.o.   MRN: [id number]  HPI  82 year old patient who has a long history of hypercalcemia secondary to primary parathyroidism.  Remains asymptomatic.  She has treated hypertension Still no bone density study.  She requires public transportation. No new concerns  Past Medical History  Diagnosis Date  . GERD 10/03/2007  . HYPERTENSION 10/03/2007  . OSTEOPENIA 02/03/2008  . Primary hyperparathyroidism ([place]) 10/03/2007    Social History   Social History  . Marital Status: Married    Spouse Name: N/A  . Number of Children: N/A  . Years of Education: N/A   Occupational History  . Not on file.   Social History Main Topics  . Smoking status: Former Smoker    Quit date: 11/12/1978  . Smokeless tobacco: Never Used  . Alcohol Use: No  . Drug Use: No  . Sexual Activity: Not on file   Other Topics Concern  . Not on file   Social History Narrative    Past Surgical History  Procedure Laterality Date  . Abdominal hysterectomy      No family history on file.  No Known Allergies  Current Outpatient Prescriptions on File Prior to Visit  Medication Sig Dispense Refill  . amLODipine (NORVASC) 2.5 MG tablet TAKE 1 TABLET EVERY DAY WITH BREAKFAST 90 tablet 1  . aspirin 81 MG chewable tablet Chew 81 mg by mouth daily.    . benazepril (LOTENSIN) 40 MG tablet TAKE 1 TABLET EVERY DAY 90 tablet 3  . furosemide (LASIX) 40 MG tablet TAKE 1 TABLET DAILY AS NEEDED FOR FLUID. 90 tablet 1  . ibuprofen (ADVIL,MOTRIN) 200 MG tablet Take 2 tablets (400 mg total) by mouth daily as needed for moderate pain. 180 tablet 1  . latanoprost (XALATAN) 0.005 % ophthalmic solution Place 1 drop into both eyes at bedtime.     Marland Kitchen LORazepam (ATIVAN) 0.5 MG tablet Take 1 tablet (0.5 mg total) by mouth 2 (two) times daily as needed. for anxiety 60 tablet 2  . meclizine (ANTIVERT) 25 MG tablet Take 1 tablet (25 mg total) by mouth 4  (four) times daily as needed. 90 tablet 3  . omeprazole (PRILOSEC) 20 MG capsule TAKE ONE CAPSULE BY MOUTH ONCE DAILY 90 capsule 3   No current facility-administered medications on file prior to visit.    BP 140/80 mmHg  Pulse 79  Temp(Src) 97.9 F (36.6 C) (Oral)  Resp 20  Ht 5' 2.5" (1.588 m)  Wt 166 lb (75.297 kg)  BMI 29.86 kg/m2  SpO2 96%     Review of Systems  Constitutional: Negative.   HENT: Negative for congestion, dental problem, hearing loss, rhinorrhea, sinus pressure, sore throat and tinnitus.   Eyes: Negative for pain, discharge and visual disturbance.  Respiratory: Negative for cough and shortness of breath.   Cardiovascular: Negative for chest pain, palpitations and leg swelling.  Gastrointestinal: Negative for nausea, vomiting, abdominal pain, diarrhea, constipation, blood in stool and abdominal distention.  Genitourinary: Negative for dysuria, urgency, frequency, hematuria, flank pain, vaginal bleeding, vaginal discharge, difficulty urinating, vaginal pain and pelvic pain.  Musculoskeletal: Negative for joint swelling, arthralgias and gait problem.  Skin: Negative for rash.  Neurological: Negative for dizziness, syncope, speech difficulty, weakness, numbness and headaches.  Hematological: Negative for adenopathy.  Psychiatric/Behavioral: Negative for behavioral problems, dysphoric mood and agitation. The patient is not nervous/anxious.        Objective:   Physical  Exam  Constitutional: She is oriented to person, place, and time. She appears well-developed and well-nourished.  Blood pressure 130/80  HENT:  Head: Normocephalic.  Right Ear: External ear normal.  Left Ear: External ear normal.  Mouth/Throat: Oropharynx is clear and moist.  Eyes: Conjunctivae and EOM are normal. Pupils are equal, round, and reactive to light.  Neck: Normal range of motion. Neck supple. No thyromegaly present.  Cardiovascular: Normal rate, regular rhythm, normal heart sounds  and intact distal pulses.   Pulmonary/Chest: Effort normal and breath sounds normal.  Abdominal: Soft. Bowel sounds are normal. She exhibits no mass. There is no tenderness.  Musculoskeletal: Normal range of motion.  Lymphadenopathy:    She has no cervical adenopathy.  Neurological: She is alert and oriented to person, place, and time.  Skin: Skin is warm and dry. No rash noted.  Psychiatric: She has a normal mood and affect. Her behavior is normal.          Assessment & Plan:    hypertension, well-controlled  Hypercalcemia.  Liberal fluid intake.  Discussed.  Bone density discussed.  She seems agreeable   We'll check calcium and renal indices  Schedule bone density

## 2016-01-03 ENCOUNTER — Other Ambulatory Visit: Payer: Self-pay | Admitting: Internal Medicine

## 2016-01-03 ENCOUNTER — Telehealth: Payer: Self-pay | Admitting: Internal Medicine

## 2016-01-03 NOTE — Telephone Encounter (Signed)
Dr. Raliegh Ip notified of pt's message.

## 2016-01-03 NOTE — Telephone Encounter (Signed)
FYI Pt is calling to let donna know her insurance does cover bone density test however she has 245.00 copay. Pt will call back to schedule once she saves up money

## 2016-01-05 ENCOUNTER — Ambulatory Visit: Payer: Commercial Managed Care - HMO | Admitting: Internal Medicine

## 2016-01-25 ENCOUNTER — Other Ambulatory Visit: Payer: Self-pay | Admitting: Internal Medicine

## 2016-03-07 ENCOUNTER — Other Ambulatory Visit: Payer: Self-pay | Admitting: Internal Medicine

## 2016-03-21 ENCOUNTER — Other Ambulatory Visit: Payer: Self-pay | Admitting: *Deleted

## 2016-03-21 MED ORDER — LORAZEPAM 0.5 MG PO TABS: 0.5000 mg | ORAL_TABLET | Freq: Two times a day (BID) | ORAL | Status: DC | PRN

## 2016-03-22 ENCOUNTER — Telehealth: Payer: Self-pay | Admitting: Internal Medicine

## 2016-03-22 MED ORDER — LORAZEPAM 0.5 MG PO TABS: 0.5000 mg | ORAL_TABLET | Freq: Two times a day (BID) | ORAL | Status: DC | PRN

## 2016-03-22 NOTE — Telephone Encounter (Signed)
Left message on voicemail Rx called into pharmacy as requested.

## 2016-03-22 NOTE — Telephone Encounter (Signed)
Please needs refill on lorazepam # 60 W.refills call in to [facility]

## 2016-03-22 NOTE — Telephone Encounter (Signed)
Can you resend Rx for lorazepam. Pt is out of Rx.

## 2016-03-26 ENCOUNTER — Other Ambulatory Visit: Payer: Self-pay | Admitting: Internal Medicine

## 2016-04-19 DIAGNOSIS — H401131 Primary open-angle glaucoma, bilateral, mild stage: Secondary | ICD-10-CM | POA: Diagnosis not present

## 2016-04-19 DIAGNOSIS — R6889 Other general symptoms and signs: Secondary | ICD-10-CM | POA: Diagnosis not present

## 2016-04-20 ENCOUNTER — Telehealth: Payer: Self-pay | Admitting: Internal Medicine

## 2016-04-20 NOTE — Telephone Encounter (Signed)
Error/ltd ° °

## 2016-05-01 ENCOUNTER — Telehealth: Payer: Self-pay | Admitting: Internal Medicine

## 2016-05-01 NOTE — Telephone Encounter (Signed)
Error/ltd ° °

## 2016-05-08 ENCOUNTER — Other Ambulatory Visit: Payer: Self-pay | Admitting: Internal Medicine

## 2016-05-09 ENCOUNTER — Other Ambulatory Visit: Payer: Self-pay | Admitting: Internal Medicine

## 2016-05-23 ENCOUNTER — Encounter: Payer: Self-pay | Admitting: Internal Medicine

## 2016-05-23 ENCOUNTER — Ambulatory Visit (INDEPENDENT_AMBULATORY_CARE_PROVIDER_SITE_OTHER): Payer: Commercial Managed Care - HMO | Admitting: Internal Medicine

## 2016-05-23 VITALS — Temp 98.0°F | Resp 20 | Ht 61.75 in | Wt 163.0 lb

## 2016-05-23 DIAGNOSIS — E785 Hyperlipidemia, unspecified: Secondary | ICD-10-CM | POA: Diagnosis not present

## 2016-05-23 DIAGNOSIS — I10 Essential (primary) hypertension: Secondary | ICD-10-CM | POA: Diagnosis not present

## 2016-05-23 DIAGNOSIS — Z0000 Encounter for general adult medical examination without abnormal findings: Secondary | ICD-10-CM | POA: Diagnosis not present

## 2016-05-23 DIAGNOSIS — M899 Disorder of bone, unspecified: Secondary | ICD-10-CM | POA: Diagnosis not present

## 2016-05-23 DIAGNOSIS — R6889 Other general symptoms and signs: Secondary | ICD-10-CM | POA: Diagnosis not present

## 2016-05-23 DIAGNOSIS — E210 Primary hyperparathyroidism: Secondary | ICD-10-CM | POA: Diagnosis not present

## 2016-05-23 DIAGNOSIS — K219 Gastro-esophageal reflux disease without esophagitis: Secondary | ICD-10-CM

## 2016-05-23 DIAGNOSIS — M949 Disorder of cartilage, unspecified: Secondary | ICD-10-CM

## 2016-05-23 LAB — CBC WITH DIFFERENTIAL/PLATELET
BASOS PCT: 0.3 % (ref 0.0–3.0)
Basophils Absolute: 0 10*3/uL (ref 0.0–0.1)
EOS ABS: 0.1 10*3/uL (ref 0.0–0.7)
EOS PCT: 1.1 % (ref 0.0–5.0)
HCT: 40 % (ref 36.0–46.0)
HEMOGLOBIN: 13.5 g/dL (ref 12.0–15.0)
Lymphocytes Relative: 21.7 % (ref 12.0–46.0)
Lymphs Abs: 1.2 10*3/uL (ref 0.7–4.0)
MCHC: 33.8 g/dL (ref 30.0–36.0)
MCV: 87 fl (ref 78.0–100.0)
MONO ABS: 0.8 10*3/uL (ref 0.1–1.0)
Monocytes Relative: 13.1 % — ABNORMAL HIGH (ref 3.0–12.0)
NEUTROS ABS: 3.7 10*3/uL (ref 1.4–7.7)
Neutrophils Relative %: 63.8 % (ref 43.0–77.0)
PLATELETS: 273 10*3/uL (ref 150.0–400.0)
RBC: 4.6 Mil/uL (ref 3.87–5.11)
RDW: 13.5 % (ref 11.5–15.5)
WBC: 5.7 10*3/uL (ref 4.0–10.5)

## 2016-05-23 LAB — LIPID PANEL
CHOL/HDL RATIO: 4
CHOLESTEROL: 239 mg/dL — AB (ref 0–200)
HDL: 64.5 mg/dL (ref >39.00)
LDL CALC: 149 mg/dL — AB (ref 0–99)
NONHDL: 174.08
Triglycerides: 125 mg/dL (ref 0.0–149.0)
VLDL: 25 mg/dL (ref 0.0–40.0)

## 2016-05-23 LAB — COMPREHENSIVE METABOLIC PANEL
ALT: 9 U/L (ref 0–35)
AST: 12 U/L (ref 0–37)
Albumin: 4.2 g/dL (ref 3.5–5.2)
Alkaline Phosphatase: 83 U/L (ref 39–117)
BUN: 18 mg/dL (ref 6–23)
CHLORIDE: 90 meq/L — AB (ref 96–112)
CO2: 26 meq/L (ref 19–32)
CREATININE: 0.95 mg/dL (ref 0.40–1.20)
Calcium: 11.4 mg/dL — ABNORMAL HIGH (ref 8.4–10.5)
GFR: 59.63 mL/min — ABNORMAL LOW (ref >60.00)
GLUCOSE: 100 mg/dL — AB (ref 70–99)
POTASSIUM: 4.8 meq/L (ref 3.5–5.1)
SODIUM: 124 meq/L — AB (ref 135–145)
Total Bilirubin: 0.9 mg/dL (ref 0.2–1.2)
Total Protein: 6.6 g/dL (ref 6.0–8.3)

## 2016-05-23 LAB — TSH: TSH: 1.49 u[IU]/mL (ref 0.35–4.50)

## 2016-05-23 MED ORDER — LATANOPROST 0.005 % OP SOLN: 1.0000 [drp] | Freq: Every day | OPHTHALMIC | Status: DC

## 2016-05-23 MED ORDER — LORAZEPAM 0.5 MG PO TABS: 0.5000 mg | ORAL_TABLET | Freq: Two times a day (BID) | ORAL | Status: DC | PRN

## 2016-05-23 NOTE — Patient Instructions (Signed)
Limit your sodium (Salt) intake  Please check your blood pressure on a regular basis.  If it is consistently greater than 150/90, please make an office appointment.  Return in 6 months for follow-up   

## 2016-05-23 NOTE — Progress Notes (Signed)
Patient ID: Annette [name], female   DOB: 1930/08/24, 82 y.o.   MRN: [id number]  Subjective:    Patient ID: Annette [name], female    DOB: [birth year], 82 y.o.   MRN: [id number]  HPI 48 -year-old patient who is seen today for a wellness exam.  Medical problems include hypertension. This has been well controlled on ACE inhibition. She has a history of glaucoma and is followed by ophthalmology. Additionally she has primary hyperparathyroidism. Calcium levels remain acceptable. She does have a history of osteopenia but no history of renal stones. She remains asymptomatic. She has gastroesophageal reflux disease which has been controlled on omeprazole. She lives with her son and uses a walker.  No history of falls   Past Medical History  Diagnosis Date  . GERD 10/03/2007  . HYPERTENSION 10/03/2007  . OSTEOPENIA 02/03/2008  . Primary hyperparathyroidism ([place]) 10/03/2007    Social History   Social History  . Marital Status: Married    Spouse Name: N/A  . Number of Children: N/A  . Years of Education: N/A   Occupational History  . Not on file.   Social History Main Topics  . Smoking status: Former Smoker    Quit date: 11/12/1978  . Smokeless tobacco: Never Used  . Alcohol Use: No  . Drug Use: No  . Sexual Activity: Not on file   Other Topics Concern  . Not on file   Social History Narrative    Past Surgical History  Procedure Laterality Date  . Abdominal hysterectomy      No family history on file.  No Known Allergies  Current Outpatient Prescriptions on File Prior to Visit  Medication Sig Dispense Refill  . amLODipine (NORVASC) 2.5 MG tablet TAKE 1 TABLET EVERY DAY WITH BREAKFAST 90 tablet 3  . aspirin 81 MG chewable tablet Chew 81 mg by mouth daily.    . benazepril (LOTENSIN) 40 MG tablet TAKE 1 TABLET EVERY DAY 90 tablet 3  . furosemide (LASIX) 40 MG tablet TAKE 1 TABLET EVERY DAY 90 tablet 3  . ibuprofen (ADVIL,MOTRIN) 200 MG tablet Take 2 tablets (400 mg total)  by mouth daily as needed for moderate pain. 180 tablet 1  . meclizine (ANTIVERT) 25 MG tablet Take 1 tablet (25 mg total) by mouth 4 (four) times daily as needed. 90 tablet 3  . omeprazole (PRILOSEC) 20 MG capsule TAKE 1 CAPSULE EVERY DAY 90 capsule 3   No current facility-administered medications on file prior to visit.    Temp(Src) 98 F (36.7 C) (Oral)  Resp 20  Ht 5' 1.75" (1.568 m)  Wt 163 lb (73.936 kg)  BMI 30.07 kg/m2  SpO2 97%   1. Risk factors, based on past  M,S,F history-  cardiovascular risk factors include a history of hypertension.  2.  Physical activities: No activity restrictions , But very limited due to general debility.  Uses a walker  3.  Depression/mood: No history depression or mood disorder  4.  Hearing: No deficits  5.  ADL's: Independent in all aspects of daily living  6.  Fall risk: Moderate to high.  Does use a walker.  No recent history of falls  7.  Home safety: No problems identified.  Lives with her son.  Widower for about 3 years  52.  Height weight, and visual acuity; height and weight stable no difficulty with visual acuity. Status post bilateral cataract surgery. Requires reading glasses only.  Has a history of glaucoma and is followed closely by  ophthalmology  9.  Counseling: Heart healthy diet regular exercise modest weight loss all encouraged  10. Lab orders based on risk factors: Laboratory profile will be reviewed calcium level reviewed  11. Referral : Not appropriate at this time  12. Care plan: Regular exercise weight loss encouraged  13. Cognitive assessment: Alert and oriented with normal affect. No cognitive dysfunction.  14.  Preventive services will include annual clinical exams, serial calcium levels will be monitored.  She will be followed closely by ophthalmology due to her glaucoma.  15.  Provider list includes ophthalmology primary care       Review of Systems  Constitutional: Negative for fever, appetite change,  fatigue and unexpected weight change.  HENT: Negative for congestion, dental problem, ear pain, hearing loss, mouth sores, nosebleeds, sinus pressure, sore throat, tinnitus, trouble swallowing and voice change.   Eyes: Negative for photophobia, pain, redness and visual disturbance.  Respiratory: Negative for cough, chest tightness and shortness of breath.   Cardiovascular: Negative for chest pain, palpitations and leg swelling.  Gastrointestinal: Negative for nausea, vomiting, abdominal pain, diarrhea, constipation, blood in stool, abdominal distention and rectal pain.  Genitourinary: Negative for dysuria, urgency, frequency, hematuria, flank pain, vaginal bleeding, vaginal discharge, difficulty urinating, genital sores, vaginal pain, menstrual problem and pelvic pain.  Musculoskeletal: Negative for back pain, arthralgias and neck stiffness.  Skin: Negative for rash.  Neurological: Negative for dizziness, syncope, speech difficulty, weakness, light-headedness, numbness and headaches.  Hematological: Negative for adenopathy. Does not bruise/bleed easily.  Psychiatric/Behavioral: Negative for suicidal ideas, behavioral problems, self-injury, dysphoric mood and agitation. The patient is not nervous/anxious.        Objective:   Physical Exam  Constitutional: She is oriented to person, place, and time. She appears well-developed and well-nourished.  HENT:  Head: Normocephalic and atraumatic.  Right Ear: External ear normal.  Left Ear: External ear normal.  Mouth/Throat: Oropharynx is clear and moist.  Poor dental hygiene  Eyes: Conjunctivae and EOM are normal.  Neck: Normal range of motion. Neck supple. No JVD present. No thyromegaly present.  Cardiovascular: Normal rate, regular rhythm, normal heart sounds and intact distal pulses.   No murmur heard. Pedal pulses intact  Pulmonary/Chest: Effort normal. She has no wheezes. She has rales.  A few crackles right base  Abdominal: Soft. Bowel  sounds are normal. She exhibits no distension and no mass. There is no tenderness. There is no rebound and no guarding.  Genitourinary: Vagina normal.  Musculoskeletal: Normal range of motion. She exhibits no edema or tenderness.  Osteoarthritic changes of the small joints of the hands  Neurological: She is alert and oriented to person, place, and time. She has normal reflexes. No cranial nerve deficit. She exhibits normal muscle tone. Coordination normal.  Decreased vibratory sensation distally  Skin: Skin is warm and dry. No rash noted.  Onychomycotic toenail changes  Psychiatric: She has a normal mood and affect. Her behavior is normal.          Assessment & Plan:   Preventive health examination Primary hyperparathyroidism. We'll review calcium level GERD stable Hypertension well controlled Glaucoma Gait abnormality History of osteopenia    Recheck 6-12 months  Nyoka Cowden, MD

## 2016-05-23 NOTE — Progress Notes (Signed)
Pre visit review using our clinic review tool, if applicable. No additional management support is needed unless otherwise documented below in the visit note. 

## 2016-05-23 NOTE — Addendum Note (Signed)
Addended by: Bluford Kaufmann F on: 05/23/2016 10:24 AM   Modules accepted: Miquel Dunn

## 2016-07-03 ENCOUNTER — Encounter: Payer: Commercial Managed Care - HMO | Admitting: Internal Medicine

## 2016-09-28 DIAGNOSIS — R6889 Other general symptoms and signs: Secondary | ICD-10-CM | POA: Diagnosis not present

## 2016-09-28 DIAGNOSIS — H401131 Primary open-angle glaucoma, bilateral, mild stage: Secondary | ICD-10-CM | POA: Diagnosis not present

## 2016-11-07 ENCOUNTER — Other Ambulatory Visit: Payer: Self-pay | Admitting: Internal Medicine

## 2016-11-11 ENCOUNTER — Encounter (HOSPITAL_COMMUNITY): Payer: Self-pay | Admitting: *Deleted

## 2016-11-11 ENCOUNTER — Emergency Department (HOSPITAL_COMMUNITY): Payer: Commercial Managed Care - HMO

## 2016-11-11 ENCOUNTER — Emergency Department (HOSPITAL_COMMUNITY)
Admission: EM | Admit: 2016-11-11 | Discharge: 2016-11-11 | Disposition: A | Discharge status: Home / Self Care | Payer: Commercial Managed Care - HMO | Attending: Emergency Medicine | Admitting: Emergency Medicine

## 2016-11-11 DIAGNOSIS — I10 Essential (primary) hypertension: Secondary | ICD-10-CM | POA: Diagnosis not present

## 2016-11-11 DIAGNOSIS — R404 Transient alteration of awareness: Secondary | ICD-10-CM | POA: Diagnosis not present

## 2016-11-11 DIAGNOSIS — Z7982 Long term (current) use of aspirin: Secondary | ICD-10-CM | POA: Diagnosis not present

## 2016-11-11 DIAGNOSIS — Z87891 Personal history of nicotine dependence: Secondary | ICD-10-CM | POA: Diagnosis not present

## 2016-11-11 DIAGNOSIS — R42 Dizziness and giddiness: Secondary | ICD-10-CM | POA: Diagnosis not present

## 2016-11-11 DIAGNOSIS — T424X1A Poisoning by benzodiazepines, accidental (unintentional), initial encounter: Secondary | ICD-10-CM

## 2016-11-11 DIAGNOSIS — R55 Syncope and collapse: Secondary | ICD-10-CM

## 2016-11-11 DIAGNOSIS — Z79899 Other long term (current) drug therapy: Secondary | ICD-10-CM | POA: Diagnosis not present

## 2016-11-11 LAB — COMPREHENSIVE METABOLIC PANEL
ALT: 10 U/L — ABNORMAL LOW (ref 14–54)
ANION GAP: 9 (ref 5–15)
AST: 39 U/L (ref 15–41)
Albumin: 3.3 g/dL — ABNORMAL LOW (ref 3.5–5.0)
Alkaline Phosphatase: 73 U/L (ref 38–126)
BILIRUBIN TOTAL: 1.3 mg/dL — AB (ref 0.3–1.2)
BUN: 30 mg/dL — AB (ref 6–20)
CO2: 22 mmol/L (ref 22–32)
Calcium: 10.3 mg/dL (ref 8.9–10.3)
Chloride: 105 mmol/L (ref 101–111)
Creatinine, Ser: 1.2 mg/dL — ABNORMAL HIGH (ref 0.44–1.00)
GFR, EST AFRICAN AMERICAN: 47 mL/min — AB (ref >60)
GFR, EST NON AFRICAN AMERICAN: 40 mL/min — AB (ref >60)
Glucose, Bld: 108 mg/dL — ABNORMAL HIGH (ref 65–99)
POTASSIUM: 5 mmol/L (ref 3.5–5.1)
Sodium: 136 mmol/L (ref 135–145)
TOTAL PROTEIN: 5.8 g/dL — AB (ref 6.5–8.1)

## 2016-11-11 LAB — TROPONIN I

## 2016-11-11 LAB — CBC WITH DIFFERENTIAL/PLATELET
Basophils Absolute: 0 10*3/uL (ref 0.0–0.1)
Basophils Relative: 0 %
Eosinophils Absolute: 0.1 10*3/uL (ref 0.0–0.7)
Eosinophils Relative: 1 %
HEMATOCRIT: 39.4 % (ref 36.0–46.0)
Hemoglobin: 13.1 g/dL (ref 12.0–15.0)
LYMPHS PCT: 21 %
Lymphs Abs: 1.5 10*3/uL (ref 0.7–4.0)
MCH: 29.8 pg (ref 26.0–34.0)
MCHC: 33.2 g/dL (ref 30.0–36.0)
MCV: 89.5 fL (ref 78.0–100.0)
MONO ABS: 0.5 10*3/uL (ref 0.1–1.0)
MONOS PCT: 8 %
NEUTROS ABS: 4.8 10*3/uL (ref 1.7–7.7)
Neutrophils Relative %: 70 %
Platelets: 143 10*3/uL — ABNORMAL LOW (ref 150–400)
RBC: 4.4 MIL/uL (ref 3.87–5.11)
RDW: 13.5 % (ref 11.5–15.5)
WBC: 6.8 10*3/uL (ref 4.0–10.5)

## 2016-11-11 LAB — URINALYSIS, ROUTINE W REFLEX MICROSCOPIC
Bilirubin Urine: NEGATIVE
Glucose, UA: NEGATIVE mg/dL
HGB URINE DIPSTICK: NEGATIVE
Ketones, ur: NEGATIVE mg/dL
LEUKOCYTES UA: NEGATIVE
Nitrite: NEGATIVE
PROTEIN: NEGATIVE mg/dL
Specific Gravity, Urine: 1.009 (ref 1.005–1.030)
pH: 6 (ref 5.0–8.0)

## 2016-11-11 LAB — CBG MONITORING, ED: Glucose-Capillary: 104 mg/dL — ABNORMAL HIGH (ref 65–99)

## 2016-11-11 MED ORDER — ONDANSETRON HCL 4 MG/2ML IJ SOLN
4.0000 mg | Freq: Once | INTRAMUSCULAR | Status: AC
  Administered 2016-11-11: 4 mg via INTRAVENOUS
  Filled 2016-11-11: qty 2

## 2016-11-11 MED ORDER — SODIUM CHLORIDE 0.9 % IV BOLUS (SEPSIS)
1000.0000 mL | Freq: Once | INTRAVENOUS | Status: AC
  Administered 2016-11-11: 1000 mL via INTRAVENOUS

## 2016-11-11 MED ORDER — SODIUM CHLORIDE 0.9 % IV SOLN
INTRAVENOUS | Status: DC
  Administered 2016-11-11: 15:00:00 via INTRAVENOUS

## 2016-11-11 NOTE — ED Notes (Signed)
Patient transported to CT and then x-ray

## 2016-11-11 NOTE — Discharge Instructions (Signed)
Make sure to take ativan as directed by your doctor.

## 2016-11-11 NOTE — ED Notes (Signed)
Pt ambulated in hallway.  She denis dizziness.  Walked as fast as I did and no sob noted.

## 2016-11-11 NOTE — ED Provider Notes (Signed)
[facility] DEPT Provider Note   CSN: [id number] Arrival date & time: 11/11/16  1319     History   Chief Complaint Chief Complaint  Patient presents with  . Loss of Consciousness    HPI Annette [name] is a 82 y.o. female.  Pt presents to the ED today with dizziness and syncope.  Pt said that she's been under a lot of stress lately.  She said that she took an extra lorazepam this morning.  Shortly afterwards, she became dizzy and she called EMS.  When EMS arrived, she did have a syncopal event.  HR and BP initially low.  She does feel better now.      Past Medical History:  Diagnosis Date  . GERD 10/03/2007  . HYPERTENSION 10/03/2007  . OSTEOPENIA 02/03/2008  . Primary hyperparathyroidism ([place]) 10/03/2007    Patient Active Problem List   Diagnosis Date Noted  . Disorder of bone and cartilage 02/03/2008  . PRIMARY HYPERPARATHYROIDISM 10/03/2007  . Essential hypertension 10/03/2007  . GERD 10/03/2007    Past Surgical History:  Procedure Laterality Date  . ABDOMINAL HYSTERECTOMY      OB History    No data available       Home Medications    Prior to Admission medications   Medication Sig Start Date End Date Taking? Authorizing Provider  amLODipine (NORVASC) 2.5 MG tablet TAKE 1 TABLET EVERY DAY WITH BREAKFAST 11/07/16   Marletta Lor, MD  aspirin 81 MG chewable tablet Chew 81 mg by mouth daily.    Historical Provider, MD  benazepril (LOTENSIN) 40 MG tablet TAKE 1 TABLET EVERY DAY 03/26/16   Marletta Lor, MD  furosemide (LASIX) 40 MG tablet TAKE 1 TABLET EVERY DAY 05/09/16   Marletta Lor, MD  ibuprofen (ADVIL,MOTRIN) 200 MG tablet Take 2 tablets (400 mg total) by mouth daily as needed for moderate pain. 01/19/15   Marletta Lor, MD  latanoprost (XALATAN) 0.005 % ophthalmic solution Place 1 drop into both eyes at bedtime. 05/23/16   Marletta Lor, MD  LORazepam (ATIVAN) 0.5 MG tablet Take 1 tablet (0.5 mg total) by mouth 2 (two)  times daily as needed. for anxiety 05/23/16   Marletta Lor, MD  meclizine (ANTIVERT) 25 MG tablet Take 1 tablet (25 mg total) by mouth 4 (four) times daily as needed. 12/27/14   Marletta Lor, MD  omeprazole (PRILOSEC) 20 MG capsule TAKE 1 CAPSULE EVERY DAY 03/08/16   Marletta Lor, MD    Family History History reviewed. No pertinent family history.  Social History Social History  Substance Use Topics  . Smoking status: Former Smoker    Quit date: 11/12/1978  . Smokeless tobacco: Never Used  . Alcohol use No     Allergies   Patient has no known allergies.   Review of Systems Review of Systems  Gastrointestinal: Positive for nausea.  Neurological: Positive for dizziness, syncope and light-headedness.  All other systems reviewed and are negative.    Physical Exam Updated Vital Signs BP 117/62   Pulse 62   Resp 18   Ht 5\' 3"  (1.6 m)   Wt 166 lb (75.3 kg)   SpO2 94%   BMI 29.41 kg/m   Physical Exam  Constitutional: She is oriented to person, place, and time. She appears well-developed and well-nourished.  HENT:  Head: Normocephalic and atraumatic.  Right Ear: External ear normal.  Left Ear: External ear normal.  Nose: Nose normal.  Mouth/Throat: Oropharynx is clear and  moist.  Eyes: Conjunctivae and EOM are normal. Pupils are equal, round, and reactive to light.  Neck: Normal range of motion. Neck supple.  Cardiovascular: Regular rhythm, normal heart sounds and intact distal pulses.  Bradycardia present.   Pulmonary/Chest: Effort normal and breath sounds normal.  Abdominal: Soft. Bowel sounds are normal.  Musculoskeletal: Normal range of motion.  Neurological: She is alert and oriented to person, place, and time.  Skin: Skin is warm.  Psychiatric: She has a normal mood and affect. Her behavior is normal. Judgment and thought content normal.  Nursing note and vitals reviewed.    ED Treatments / Results  Labs (all labs ordered are listed, but  only abnormal results are displayed) Labs Reviewed  CBC WITH DIFFERENTIAL/PLATELET - Abnormal; Notable for the following:       Result Value   Platelets 143 (*)    All other components within normal limits  COMPREHENSIVE METABOLIC PANEL - Abnormal; Notable for the following:    Glucose, Bld 108 (*)    BUN 30 (*)    Creatinine, Ser 1.20 (*)    Total Protein 5.8 (*)    Albumin 3.3 (*)    ALT 10 (*)    Total Bilirubin 1.3 (*)    GFR calc non Af Amer 40 (*)    GFR calc Af Amer 47 (*)    All other components within normal limits  URINALYSIS, ROUTINE W REFLEX MICROSCOPIC - Abnormal; Notable for the following:    APPearance HAZY (*)    All other components within normal limits  CBG MONITORING, ED - Abnormal; Notable for the following:    Glucose-Capillary 104 (*)    All other components within normal limits  TROPONIN I  POCT CBG (FASTING - GLUCOSE)-MANUAL ENTRY    EKG  EKG Interpretation  Date/Time:  Sunday November 11 2016 13:33:15 EST Ventricular Rate:  47 PR Interval:    QRS Duration: 92 QT Interval:  454 QTC Calculation: 402 R Axis:   -7 Text Interpretation:  Sinus bradycardia Prolonged PR interval Probable anteroseptal infarct, old No old tracing to compare Confirmed by [facility] MD, Cyler Kappes [phone]) on 11/11/2016 2:02:56 PM       Radiology Dg Chest 2 View  Result Date: 11/11/2016 CLINICAL DATA:  Syncopal episode earlier today.  Dizziness. EXAM: CHEST  2 VIEW COMPARISON:  07/20/2014 FINDINGS: Grossly unchanged cardiac silhouette and mediastinal contours. Atherosclerotic plaque within a slightly tortuous thoracic aorta. The lungs remain hyperexpanded with flattening the diaphragms. Minimal bibasilar opacities favored to represent atelectasis, grossly unchanged. No new focal airspace opacities. No pleural effusion pneumothorax. No evidence of edema. No acute osseous abnormalities. IMPRESSION: Hyperexpanded lungs without acute cardiopulmonary disease. Electronically Signed   By:  Sandi Mariscal M.D.   On: 11/11/2016 15:06   Ct Head Wo Contrast  Result Date: 11/11/2016 CLINICAL DATA:  Dizziness, syncope EXAM: CT HEAD WITHOUT CONTRAST TECHNIQUE: Contiguous axial images were obtained from the base of the skull through the vertex without intravenous contrast. COMPARISON:  None. FINDINGS: Brain: There is atrophy and chronic small vessel disease changes. No acute intracranial abnormality. Specifically, no hemorrhage, hydrocephalus, mass lesion, acute infarction, or significant intracranial injury. Vascular: No hyperdense vessel or unexpected calcification. Skull: No acute calvarial abnormality. Sinuses/Orbits: Visualized paranasal sinuses and mastoids clear. Orbital soft tissues unremarkable. Other: None IMPRESSION: No acute intracranial abnormality. Atrophy, chronic microvascular disease. Electronically Signed   By: Rolm Baptise M.D.   On: 11/11/2016 14:48    Procedures Procedures (including critical care time)  Medications  Ordered in ED Medications  sodium chloride 0.9 % bolus 1,000 mL (0 mLs Intravenous Stopped 11/11/16 1514)    And  0.9 %  sodium chloride infusion ( Intravenous New Bag/Given 11/11/16 1529)  ondansetron (ZOFRAN) injection 4 mg (4 mg Intravenous Given 11/11/16 1350)     Initial Impression / Assessment and Plan / ED Course  I have reviewed the triage vital signs and the nursing notes.  Pertinent labs & imaging results that were available during my care of the patient were reviewed by me and considered in my medical decision making (see chart for details).  Clinical Course    Syncope is likely due to accidental overdose of benzos.  Pt looks much better now.  She is able to ambulate like normal with her walker.  She knows to return if worse.  Final Clinical Impressions(s) / ED Diagnoses   Final diagnoses:  Syncope, unspecified syncope type  Benzodiazepine overdose, accidental or unintentional, initial encounter    New Prescriptions New  Prescriptions   No medications on file     Isla Pence, MD 11/11/16 1635

## 2016-11-11 NOTE — ED Triage Notes (Signed)
Patient comes in per [facility] with c/o dizziness and syncopal episode and bradycardia/hypotensive. Patient states she became dizzy this afternoon cooking. She states she took two lorazepam instead of one. Patient is alert but foregetful.. Patient HR was 45 with EMS otherwise ekg unremarkable. Patient denies cardiac hx. Patient became dizzy and had witnessed syncopal episode by ems. EMS bp palpated was 90.

## 2016-11-15 ENCOUNTER — Other Ambulatory Visit: Payer: Self-pay | Admitting: Internal Medicine

## 2016-11-23 ENCOUNTER — Ambulatory Visit (INDEPENDENT_AMBULATORY_CARE_PROVIDER_SITE_OTHER): Payer: Commercial Managed Care - HMO | Admitting: Internal Medicine

## 2016-11-23 ENCOUNTER — Encounter: Payer: Self-pay | Admitting: Internal Medicine

## 2016-11-23 VITALS — BP 128/70 | HR 72 | Temp 97.3°F | Ht 63.0 in | Wt 166.0 lb

## 2016-11-23 DIAGNOSIS — I10 Essential (primary) hypertension: Secondary | ICD-10-CM | POA: Diagnosis not present

## 2016-11-23 DIAGNOSIS — E210 Primary hyperparathyroidism: Secondary | ICD-10-CM | POA: Diagnosis not present

## 2016-11-23 DIAGNOSIS — R6889 Other general symptoms and signs: Secondary | ICD-10-CM | POA: Diagnosis not present

## 2016-11-23 NOTE — Progress Notes (Signed)
Pre visit review using our clinic review tool, if applicable. No additional management support is needed unless otherwise documented below in the visit note. 

## 2016-11-23 NOTE — Progress Notes (Signed)
Subjective:    Patient ID: Annette [name], female    DOB: 02/21/1930, 83 y.o.   MRN: [id number]  HPI  83 year old patient who is seen today in follow-up.  She has a history of essential hypertension as well as primary hyperparathyroidism. She was seen in the ED recently after a syncopal episode.  Symptoms have not reoccurred. She has been under considerable stress due to the divorce of her son and her relationship with her daughter-in-law. On complaint today is some mild pedal edema. Laboratory studies from her ED visit reviewed  Past Medical History:  Diagnosis Date  . GERD 10/03/2007  . HYPERTENSION 10/03/2007  . OSTEOPENIA 02/03/2008  . Primary hyperparathyroidism ([place]) 10/03/2007     Social History   Social History  . Marital status: Married    Spouse name: N/A  . Number of children: N/A  . Years of education: N/A   Occupational History  . Not on file.   Social History Main Topics  . Smoking status: Former Smoker    Quit date: 11/12/1978  . Smokeless tobacco: Never Used  . Alcohol use No  . Drug use: No  . Sexual activity: Not on file   Other Topics Concern  . Not on file   Social History Narrative  . No narrative on file    Past Surgical History:  Procedure Laterality Date  . ABDOMINAL HYSTERECTOMY      No family history on file.  Allergies  Allergen Reactions  . Codeine Nausea And Vomiting    Current Outpatient Prescriptions on File Prior to Visit  Medication Sig Dispense Refill  . amLODipine (NORVASC) 2.5 MG tablet TAKE 1 TABLET EVERY DAY WITH BREAKFAST 90 tablet 3  . aspirin 81 MG chewable tablet Chew 81 mg by mouth daily.    . benazepril (LOTENSIN) 40 MG tablet TAKE 1 TABLET EVERY DAY 90 tablet 3  . furosemide (LASIX) 40 MG tablet TAKE 1 TABLET EVERY DAY 90 tablet 3  . ibuprofen (ADVIL,MOTRIN) 200 MG tablet Take 2 tablets (400 mg total) by mouth daily as needed for moderate pain. 180 tablet 1  . latanoprost (XALATAN) 0.005 % ophthalmic  solution Place 1 drop into both eyes at bedtime. 7.5 mL 3  . LORazepam (ATIVAN) 0.5 MG tablet Take 1 tablet (0.5 mg total) by mouth 2 (two) times daily as needed. for anxiety 60 tablet 5  . meclizine (ANTIVERT) 25 MG tablet Take 1 tablet (25 mg total) by mouth 4 (four) times daily as needed. 90 tablet 3  . omeprazole (PRILOSEC) 20 MG capsule TAKE 1 CAPSULE EVERY DAY 90 capsule 3   No current facility-administered medications on file prior to visit.     BP 128/70 (BP Location: Right Arm, Patient Position: Sitting, Cuff Size: Normal)   Pulse 72   Temp 97.3 F (36.3 C) (Oral)   Ht 5\' 3"  (1.6 m)   Wt 166 lb (75.3 kg)   SpO2 97%   BMI 29.41 kg/m     Review of Systems  Constitutional: Negative.   HENT: Negative for congestion, dental problem, hearing loss, rhinorrhea, sinus pressure, sore throat and tinnitus.   Eyes: Negative for pain, discharge and visual disturbance.  Respiratory: Negative for cough and shortness of breath.   Cardiovascular: Positive for leg swelling. Negative for chest pain and palpitations.  Gastrointestinal: Negative for abdominal distention, abdominal pain, blood in stool, constipation, diarrhea, nausea and vomiting.  Genitourinary: Negative for difficulty urinating, dysuria, flank pain, frequency, hematuria, pelvic pain, urgency, vaginal bleeding,  vaginal discharge and vaginal pain.  Musculoskeletal: Negative for arthralgias, gait problem and joint swelling.  Skin: Negative for rash.  Neurological: Negative for dizziness, syncope, speech difficulty, weakness, numbness and headaches.  Hematological: Negative for adenopathy.  Psychiatric/Behavioral: Negative for agitation, behavioral problems and dysphoric mood. The patient is nervous/anxious.        Objective:   Physical Exam  Constitutional: She is oriented to person, place, and time. She appears well-developed and well-nourished.  BP well controlled  HENT:  Head: Normocephalic.  Right Ear: External ear  normal.  Left Ear: External ear normal.  Mouth/Throat: Oropharynx is clear and moist.  Eyes: Conjunctivae and EOM are normal. Pupils are equal, round, and reactive to light.  Neck: Normal range of motion. Neck supple. No thyromegaly present.  Cardiovascular: Normal rate, regular rhythm, normal heart sounds and intact distal pulses.   Pulmonary/Chest: Effort normal and breath sounds normal.  Abdominal: Soft. Bowel sounds are normal. She exhibits no mass. There is no tenderness.  Musculoskeletal: Normal range of motion. She exhibits edema.  Trace right ankle edema  Lymphadenopathy:    She has no cervical adenopathy.  Neurological: She is alert and oriented to person, place, and time.  Skin: Skin is warm and dry. No rash noted.  Psychiatric: She has a normal mood and affect. Her behavior is normal.          Assessment & Plan:   Essential hypertension.  No change in therapy History of syncope.  Stable we'll continue to observe Primary hyperparathyroidism.  Recent calcium level high normal.  Will recheck in 6 months  Alliya Marcon Pilar Plate

## 2016-11-23 NOTE — Patient Instructions (Signed)
Limit your sodium (Salt) intake  Please check your blood pressure on a regular basis.  If it is consistently greater than 150/90, please make an office appointment.  Return in 6 months for follow-up   

## 2016-12-16 ENCOUNTER — Other Ambulatory Visit: Payer: Self-pay | Admitting: Internal Medicine

## 2016-12-17 ENCOUNTER — Other Ambulatory Visit: Payer: Self-pay | Admitting: Internal Medicine

## 2017-01-14 ENCOUNTER — Other Ambulatory Visit: Payer: Self-pay | Admitting: Internal Medicine

## 2017-02-04 ENCOUNTER — Other Ambulatory Visit: Payer: Self-pay | Admitting: Internal Medicine

## 2017-04-02 DIAGNOSIS — R6889 Other general symptoms and signs: Secondary | ICD-10-CM | POA: Diagnosis not present

## 2017-04-02 DIAGNOSIS — H401131 Primary open-angle glaucoma, bilateral, mild stage: Secondary | ICD-10-CM | POA: Diagnosis not present

## 2017-04-02 DIAGNOSIS — H21531 Iridodialysis, right eye: Secondary | ICD-10-CM | POA: Diagnosis not present

## 2017-04-02 DIAGNOSIS — H353131 Nonexudative age-related macular degeneration, bilateral, early dry stage: Secondary | ICD-10-CM | POA: Diagnosis not present

## 2017-05-17 ENCOUNTER — Ambulatory Visit (INDEPENDENT_AMBULATORY_CARE_PROVIDER_SITE_OTHER): Payer: Medicare HMO | Admitting: Internal Medicine

## 2017-05-17 ENCOUNTER — Encounter: Payer: Self-pay | Admitting: Internal Medicine

## 2017-05-17 VITALS — BP 132/74 | HR 80 | Temp 98.0°F | Wt 177.6 lb

## 2017-05-17 DIAGNOSIS — K219 Gastro-esophageal reflux disease without esophagitis: Secondary | ICD-10-CM

## 2017-05-17 DIAGNOSIS — I10 Essential (primary) hypertension: Secondary | ICD-10-CM | POA: Diagnosis not present

## 2017-05-17 DIAGNOSIS — E210 Primary hyperparathyroidism: Secondary | ICD-10-CM

## 2017-05-17 DIAGNOSIS — R6889 Other general symptoms and signs: Secondary | ICD-10-CM | POA: Diagnosis not present

## 2017-05-17 LAB — COMPREHENSIVE METABOLIC PANEL
ALBUMIN: 4 g/dL (ref 3.5–5.2)
ALT: 9 U/L (ref 0–35)
AST: 14 U/L (ref 0–37)
Alkaline Phosphatase: 88 U/L (ref 39–117)
BUN: 20 mg/dL (ref 6–23)
CALCIUM: 11.1 mg/dL — AB (ref 8.4–10.5)
CO2: 29 meq/L (ref 19–32)
CREATININE: 1.17 mg/dL (ref 0.40–1.20)
Chloride: 102 mEq/L (ref 96–112)
GFR: 46.77 mL/min — AB (ref >60.00)
Glucose, Bld: 96 mg/dL (ref 70–99)
POTASSIUM: 4.3 meq/L (ref 3.5–5.1)
Sodium: 137 mEq/L (ref 135–145)
Total Bilirubin: 0.6 mg/dL (ref 0.2–1.2)
Total Protein: 6.4 g/dL (ref 6.0–8.3)

## 2017-05-17 NOTE — Progress Notes (Signed)
Subjective:    Patient ID: Annette [name], female    DOB: 1929-12-24, 83 y.o.   MRN: [id number]  HPI 83 year old patient who is seen today for her six-month follow-up.  She has a history of essential hypertension.  She also has a history of primary hyperparathyroidism.  6 months ago.  Her calcium level was 10 point 3.  She was seen in the ED for evaluation of syncope.  This has not reoccurred No cardiopulmonary complaints.  She has a history of gastroesophageal reflux disease, well controlled with omeprazole  Past Medical History:  Diagnosis Date  . GERD 10/03/2007  . HYPERTENSION 10/03/2007  . OSTEOPENIA 02/03/2008  . Primary hyperparathyroidism ([place]) 10/03/2007     Social History   Social History  . Marital status: Married    Spouse name: N/A  . Number of children: N/A  . Years of education: N/A   Occupational History  . Not on file.   Social History Main Topics  . Smoking status: Former Smoker    Quit date: 11/12/1978  . Smokeless tobacco: Never Used  . Alcohol use No  . Drug use: No  . Sexual activity: Not on file   Other Topics Concern  . Not on file   Social History Narrative  . No narrative on file    Past Surgical History:  Procedure Laterality Date  . ABDOMINAL HYSTERECTOMY      No family history on file.  Allergies  Allergen Reactions  . Codeine Nausea And Vomiting    Current Outpatient Prescriptions on File Prior to Visit  Medication Sig Dispense Refill  . amLODipine (NORVASC) 2.5 MG tablet TAKE 1 TABLET EVERY DAY WITH BREAKFAST 90 tablet 3  . aspirin 81 MG chewable tablet Chew 81 mg by mouth daily.    . benazepril (LOTENSIN) 40 MG tablet TAKE 1 TABLET EVERY DAY 90 tablet 3  . furosemide (LASIX) 40 MG tablet TAKE 1 TABLET EVERY DAY 90 tablet 3  . ibuprofen (ADVIL,MOTRIN) 200 MG tablet Take 2 tablets (400 mg total) by mouth daily as needed for moderate pain. 180 tablet 1  . latanoprost (XALATAN) 0.005 % ophthalmic solution INSTILL 1 DROP INTO  BOTH EYES AT BEDTIME 7.5 mL 3  . LORazepam (ATIVAN) 0.5 MG tablet TAKE ONE TABLET BY MOUTH TWICE DAILY AS NEEDED FOR ANXIETY 60 tablet 5  . meclizine (ANTIVERT) 25 MG tablet Take 1 tablet (25 mg total) by mouth 4 (four) times daily as needed. 90 tablet 3  . omeprazole (PRILOSEC) 20 MG capsule TAKE 1 CAPSULE EVERY DAY 90 capsule 3   No current facility-administered medications on file prior to visit.     BP 132/74 (BP Location: Left Arm, Patient Position: Sitting, Cuff Size: Normal)   Pulse 80   Temp 98 F (36.7 C) (Oral)   Wt 177 lb 9.6 oz (80.6 kg)   SpO2 97%   BMI 31.46 kg/m      Review of Systems  Constitutional: Negative.   HENT: Negative for congestion, dental problem, hearing loss, rhinorrhea, sinus pressure, sore throat and tinnitus.   Eyes: Negative for pain, discharge and visual disturbance.  Respiratory: Negative for cough and shortness of breath.   Cardiovascular: Positive for leg swelling. Negative for chest pain and palpitations.  Gastrointestinal: Negative for abdominal distention, abdominal pain, blood in stool, constipation, diarrhea, nausea and vomiting.  Genitourinary: Negative for difficulty urinating, dysuria, flank pain, frequency, hematuria, pelvic pain, urgency, vaginal bleeding, vaginal discharge and vaginal pain.  Musculoskeletal: Positive for gait  problem. Negative for arthralgias and joint swelling.  Skin: Negative for rash.  Neurological: Negative for dizziness, syncope, speech difficulty, weakness, numbness and headaches.  Hematological: Negative for adenopathy.  Psychiatric/Behavioral: Negative for agitation, behavioral problems and dysphoric mood. The patient is not nervous/anxious.        Objective:   Physical Exam  Constitutional: She is oriented to person, place, and time. She appears well-developed and well-nourished.  Weight 177 Blood pressure well controlled  HENT:  Head: Normocephalic.  Right Ear: External ear normal.  Left Ear:  External ear normal.  Mouth/Throat: Oropharynx is clear and moist.  Eyes: Conjunctivae and EOM are normal. Pupils are equal, round, and reactive to light.  Neck: Normal range of motion. Neck supple. No thyromegaly present.  Cardiovascular: Normal rate, regular rhythm, normal heart sounds and intact distal pulses.   Pulmonary/Chest: Effort normal and breath sounds normal.  Abdominal: Soft. Bowel sounds are normal. She exhibits no mass. There is no tenderness.  Musculoskeletal: Normal range of motion. She exhibits edema.  Trace edema, right foot  Lymphadenopathy:    She has no cervical adenopathy.  Neurological: She is alert and oriented to person, place, and time.  Skin: Skin is warm and dry. No rash noted.  Psychiatric: She has a normal mood and affect. Her behavior is normal.          Assessment & Plan:   Essential hypertension, well-controlled Primary hyperparathyroidism.  We'll check a calcium Gastroesophageal reflux disease History of syncope, no reoccurrence  We'll check CBC and complete metabolic profile CPX 6 months Medications updated Patient will consider a trial off PPI therapy  Nyoka Cowden

## 2017-05-17 NOTE — Patient Instructions (Signed)
Limit your sodium (Salt) intake  Please check your blood pressure on a regular basis.  If it is consistently greater than 150/90, please make an office appointment.  Avoids foods high in acid such as tomatoes citrus juices, and spicy foods.  Avoid eating within two hours of lying down or before exercising.  Do not overheat.  Try smaller more frequent meals.  Consider a trial off omeprazole.  Okay to resume medication, if symptoms recur  Return in 6 months for follow-up

## 2017-06-06 IMAGING — CT CT HEAD W/O CM
3 of 4 series · 18 of 47 positions shown, 21 images · non-contrast
Comparison: None.

CLINICAL DATA: Dizziness, syncope

EXAM:
CT HEAD WITHOUT CONTRAST
TECHNIQUE: Contiguous axial images were obtained from the base of the skull
through the vertex without intravenous contrast.

[Series 201: head w/o, idose (1) · axial · non-contrast · 0.42mm/px · z∈[+7,+142]mm · 12 of 33 slices shown, 15 images]
[im 3/33  brain]
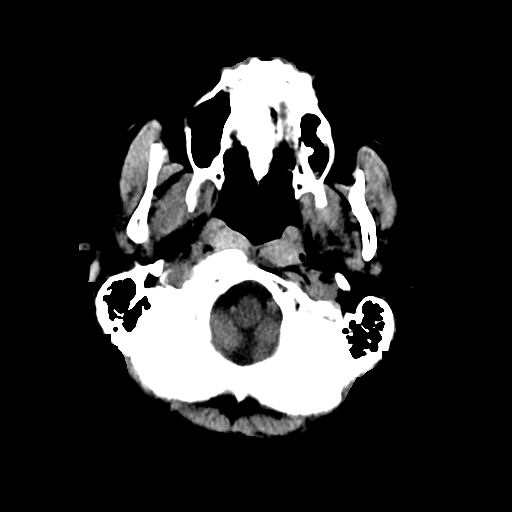
[im 3/33  bone]
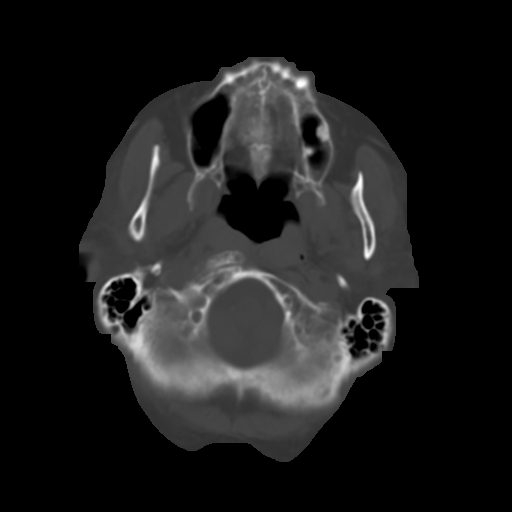
[im 5/33  brain]
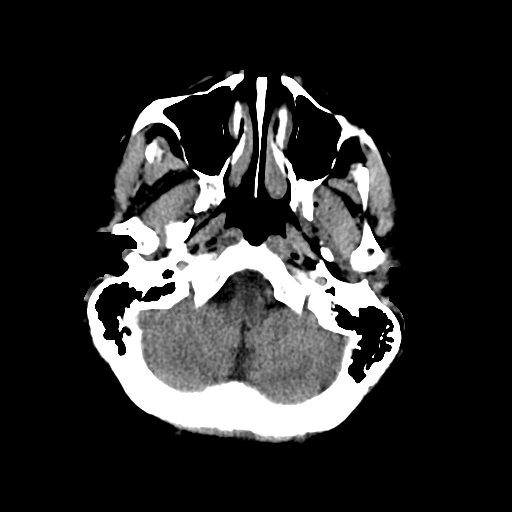
[im 7/33  brain]
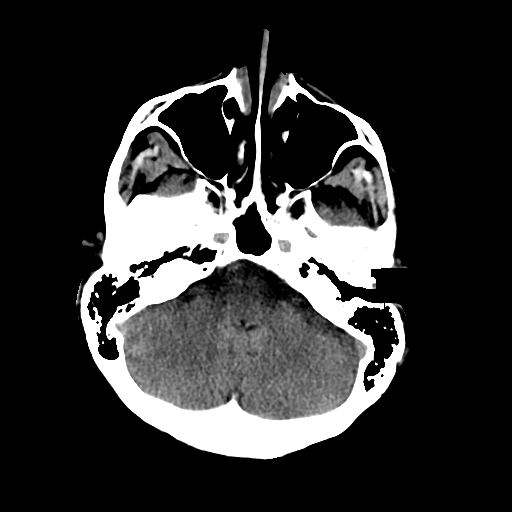
[im 10/33  brain]
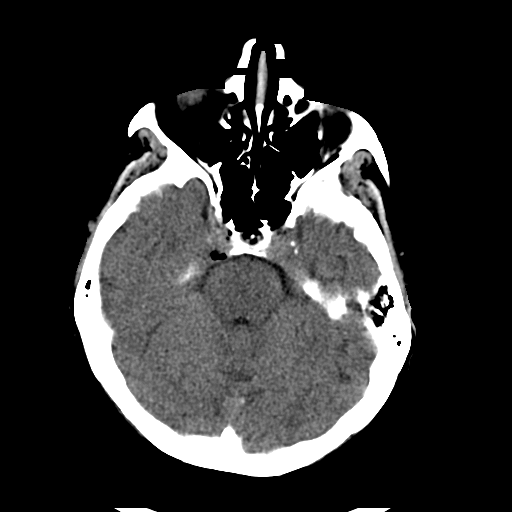
[im 12/33  brain]
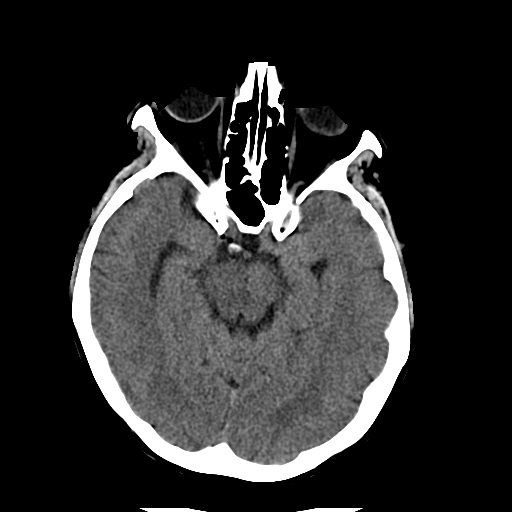
[im 12/33  bone]
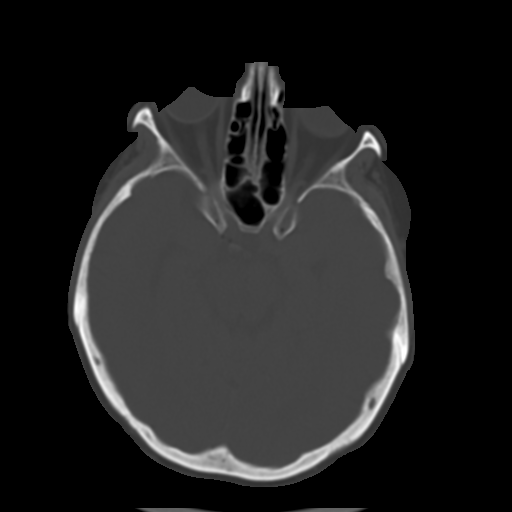
[im 14/33  brain]
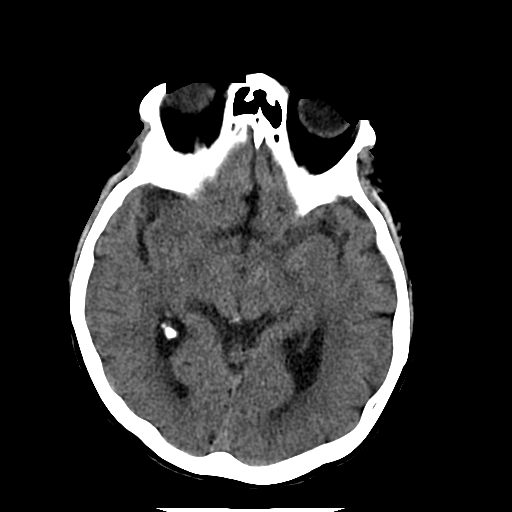
[im 19/33  brain]
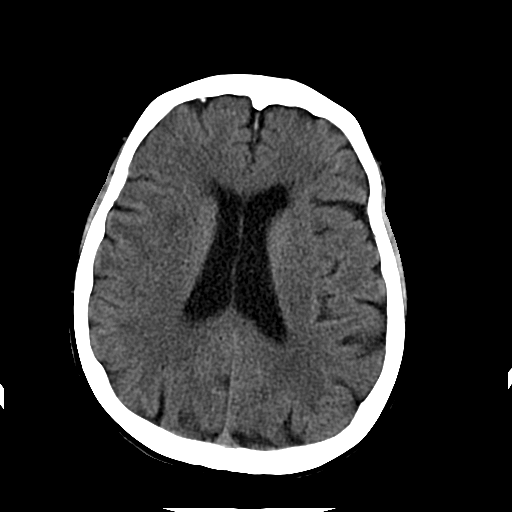
[im 21/33  brain]
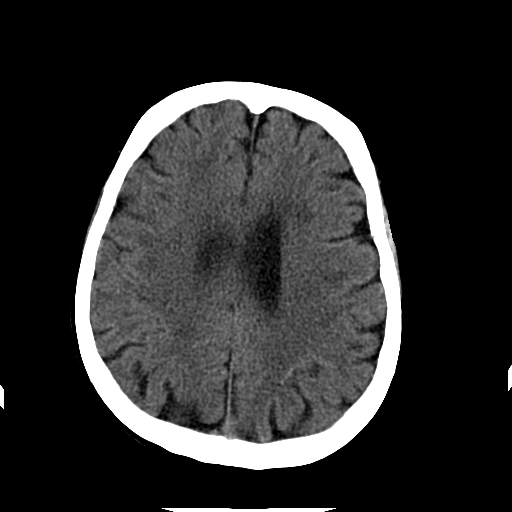
[im 23/33  brain]
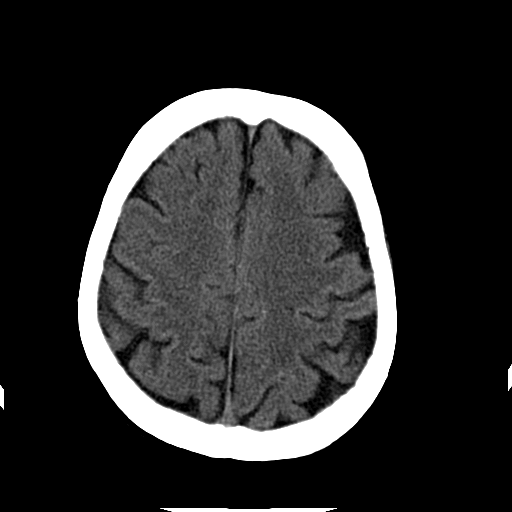
[im 23/33  bone]
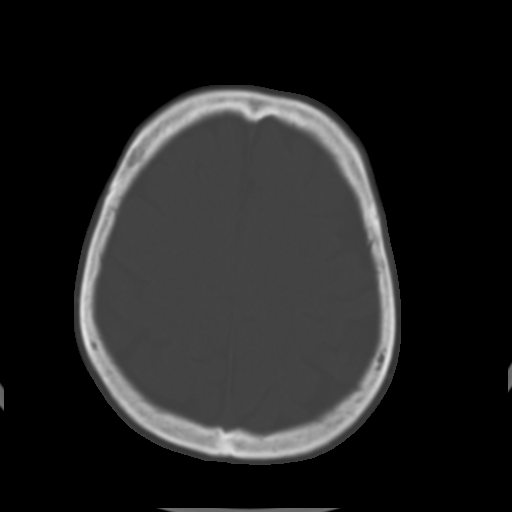
[im 26/33  brain]
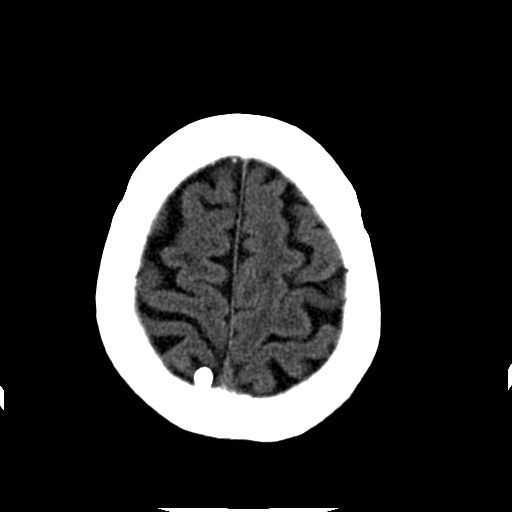
[im 28/33  brain]
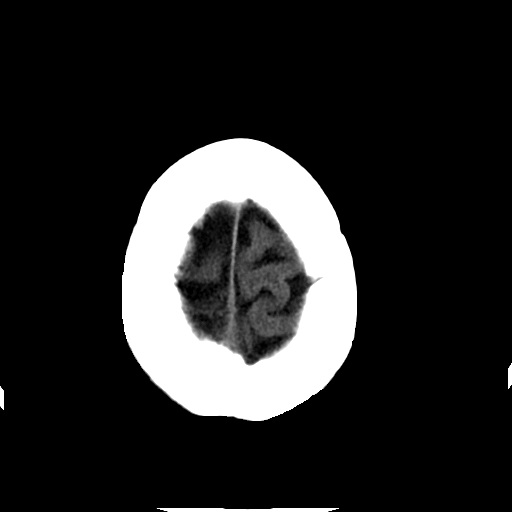
[im 30/33  brain]
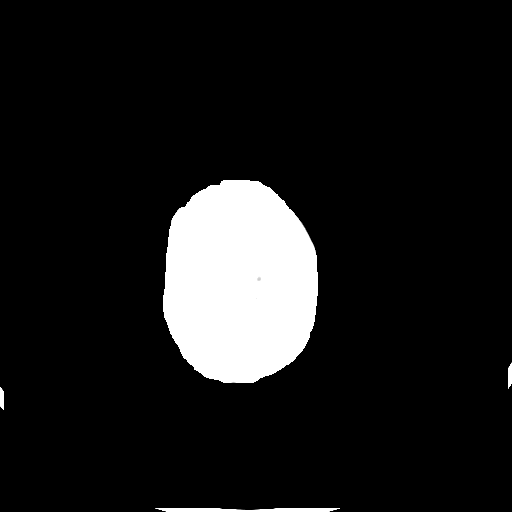

[Series 203: coronal st, idose (1) · coronal · 0.40mm/px · 3 of 71 slices shown]
[im 24/71  brain]
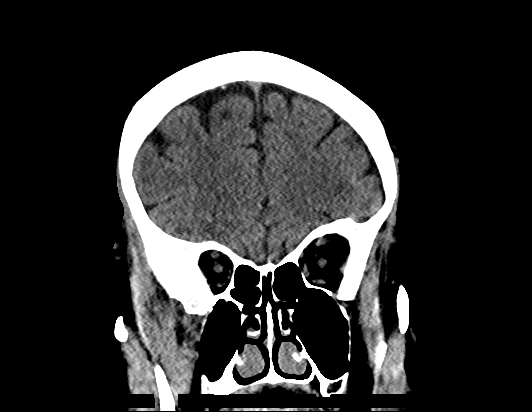
[im 32/71  brain]
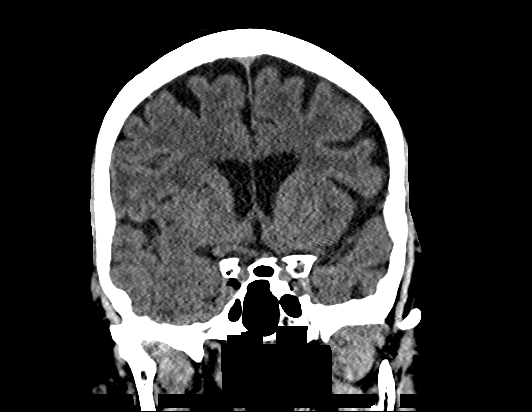
[im 39/71  brain]
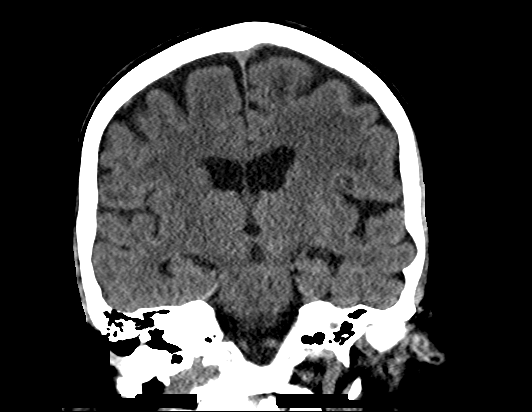

[Series 204: sagittal st, idose (1) · sagittal · 0.40mm/px · 3 of 71 slices shown]
[im 24/71  brain]
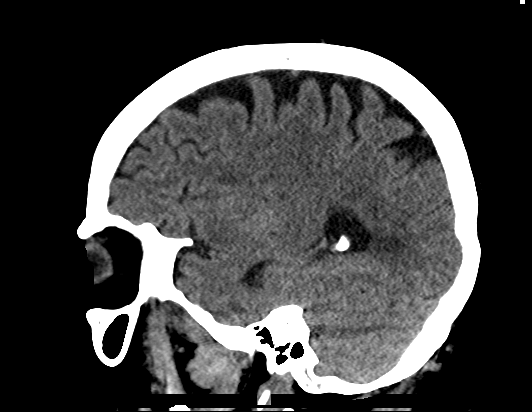
[im 36/71  brain]
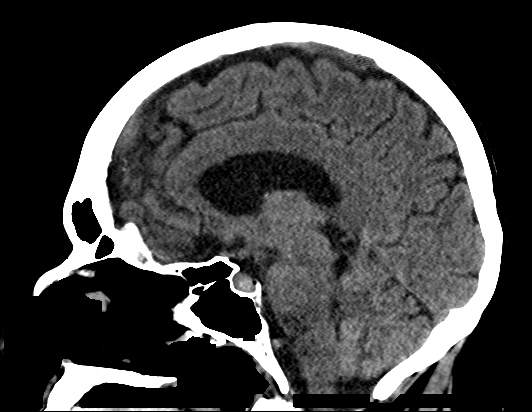
[im 47/71  brain]
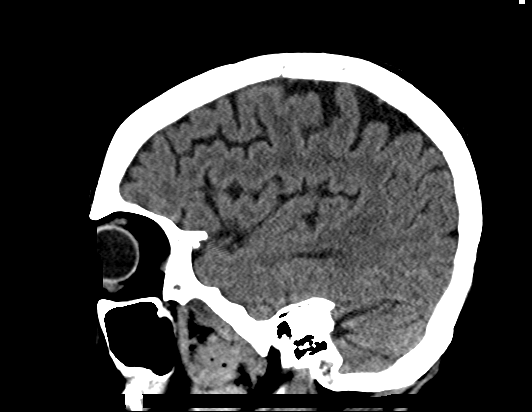

[18 of 47 positions shown; findings below may reference images not displayed]

FINDINGS: Brain: There is atrophy and chronic small vessel disease changes. No
acute intracranial abnormality. Specifically, no hemorrhage,
hydrocephalus, mass lesion, acute infarction, or significant
intracranial injury.

Vascular: No hyperdense vessel or unexpected calcification.

Skull: No acute calvarial abnormality.

Sinuses/Orbits: Visualized paranasal sinuses and mastoids clear.
Orbital soft tissues unremarkable.

Other: None
IMPRESSION: No acute intracranial abnormality.

Atrophy, chronic microvascular disease.

## 2017-10-11 DIAGNOSIS — H353130 Nonexudative age-related macular degeneration, bilateral, stage unspecified: Secondary | ICD-10-CM | POA: Diagnosis not present

## 2017-10-11 DIAGNOSIS — H401131 Primary open-angle glaucoma, bilateral, mild stage: Secondary | ICD-10-CM | POA: Diagnosis not present

## 2017-10-11 DIAGNOSIS — R6889 Other general symptoms and signs: Secondary | ICD-10-CM | POA: Diagnosis not present

## 2017-10-15 ENCOUNTER — Other Ambulatory Visit: Payer: Self-pay | Admitting: Internal Medicine

## 2017-10-28 ENCOUNTER — Other Ambulatory Visit: Payer: Self-pay | Admitting: Internal Medicine

## 2017-11-21 ENCOUNTER — Ambulatory Visit (INDEPENDENT_AMBULATORY_CARE_PROVIDER_SITE_OTHER): Payer: Medicare HMO | Admitting: Internal Medicine

## 2017-11-21 ENCOUNTER — Encounter: Payer: Self-pay | Admitting: Internal Medicine

## 2017-11-21 VITALS — BP 140/82 | HR 96 | Temp 97.9°F | Ht 63.0 in | Wt 180.2 lb

## 2017-11-21 DIAGNOSIS — I10 Essential (primary) hypertension: Secondary | ICD-10-CM

## 2017-11-21 DIAGNOSIS — L821 Other seborrheic keratosis: Secondary | ICD-10-CM | POA: Diagnosis not present

## 2017-11-21 DIAGNOSIS — E210 Primary hyperparathyroidism: Secondary | ICD-10-CM

## 2017-11-21 DIAGNOSIS — R6889 Other general symptoms and signs: Secondary | ICD-10-CM | POA: Diagnosis not present

## 2017-11-21 LAB — COMPREHENSIVE METABOLIC PANEL
ALBUMIN: 4.3 g/dL (ref 3.5–5.2)
ALK PHOS: 84 U/L (ref 39–117)
ALT: 11 U/L (ref 0–35)
AST: 16 U/L (ref 0–37)
BILIRUBIN TOTAL: 0.5 mg/dL (ref 0.2–1.2)
BUN: 18 mg/dL (ref 6–23)
CALCIUM: 11.2 mg/dL — AB (ref 8.4–10.5)
CO2: 30 mEq/L (ref 19–32)
CREATININE: 1.02 mg/dL (ref 0.40–1.20)
Chloride: 102 mEq/L (ref 96–112)
GFR: 54.73 mL/min — ABNORMAL LOW (ref >60.00)
Glucose, Bld: 95 mg/dL (ref 70–99)
Potassium: 5.5 mEq/L — ABNORMAL HIGH (ref 3.5–5.1)
SODIUM: 138 meq/L (ref 135–145)
TOTAL PROTEIN: 6.7 g/dL (ref 6.0–8.3)

## 2017-11-21 NOTE — Progress Notes (Signed)
Subjective:    Patient ID: Annette [name], female    DOB: 06-22-1930, 84 y.o.   MRN: [id number]  HPI  84 year old patient who has a history of essential hypertension.  She also has hypercalcemia secondary to primary hyperparathyroidism.  She remains asymptomatic. No history of renal stones peptic ulcer disease.  She has declined the bone density studies. She generally feels well today. No new concerns or complaints  Past Medical History:  Diagnosis Date  . GERD 10/03/2007  . HYPERTENSION 10/03/2007  . OSTEOPENIA 02/03/2008  . Primary hyperparathyroidism ([place]) 10/03/2007     Social History   Socioeconomic History  . Marital status: Married    Spouse name: Not on file  . Number of children: Not on file  . Years of education: Not on file  . Highest education level: Not on file  Social Needs  . Financial resource strain: Not on file  . Food insecurity - worry: Not on file  . Food insecurity - inability: Not on file  . Transportation needs - medical: Not on file  . Transportation needs - non-medical: Not on file  Occupational History  . Not on file  Tobacco Use  . Smoking status: Former Smoker    Last attempt to quit: 11/12/1978    Years since quitting: 39.0  . Smokeless tobacco: Never Used  Substance and Sexual Activity  . Alcohol use: No  . Drug use: No  . Sexual activity: Not on file  Other Topics Concern  . Not on file  Social History Narrative  . Not on file    Past Surgical History:  Procedure Laterality Date  . ABDOMINAL HYSTERECTOMY      History reviewed. No pertinent family history.  Allergies  Allergen Reactions  . Codeine Nausea And Vomiting    Current Outpatient Medications on File Prior to Visit  Medication Sig Dispense Refill  . amLODipine (NORVASC) 2.5 MG tablet TAKE 1 TABLET EVERY DAY WITH BREAKFAST 90 tablet 3  . aspirin 81 MG chewable tablet Chew 81 mg by mouth daily.    . benazepril (LOTENSIN) 40 MG tablet TAKE 1 TABLET EVERY DAY 90  tablet 3  . furosemide (LASIX) 40 MG tablet TAKE 1 TABLET EVERY DAY 90 tablet 3  . ibuprofen (ADVIL,MOTRIN) 200 MG tablet Take 2 tablets (400 mg total) by mouth daily as needed for moderate pain. 180 tablet 1  . latanoprost (XALATAN) 0.005 % ophthalmic solution INSTILL 1 DROP INTO BOTH EYES AT BEDTIME 7.5 mL 3  . LORazepam (ATIVAN) 0.5 MG tablet TAKE ONE TABLET BY MOUTH TWICE DAILY AS NEEDED FOR ANXIETY 60 tablet 5  . meclizine (ANTIVERT) 25 MG tablet Take 1 tablet (25 mg total) by mouth 4 (four) times daily as needed. 90 tablet 3  . omeprazole (PRILOSEC) 20 MG capsule TAKE 1 CAPSULE EVERY DAY 90 capsule 3   No current facility-administered medications on file prior to visit.     BP 140/82 (BP Location: Left Arm, Patient Position: Sitting, Cuff Size: Normal)   Pulse 96   Temp 97.9 F (36.6 C) (Oral)   Ht 5\' 3"  (1.6 m)   Wt 180 lb 3.2 oz (81.7 kg)   SpO2 97%   BMI 31.92 kg/m     Review of Systems  Constitutional: Negative.   HENT: Negative for congestion, dental problem, hearing loss, rhinorrhea, sinus pressure, sore throat and tinnitus.   Eyes: Negative for pain, discharge and visual disturbance.  Respiratory: Negative for cough and shortness of breath.  Cardiovascular: Negative for chest pain, palpitations and leg swelling.  Gastrointestinal: Negative for abdominal distention, abdominal pain, blood in stool, constipation, diarrhea, nausea and vomiting.  Genitourinary: Negative for difficulty urinating, dysuria, flank pain, frequency, hematuria, pelvic pain, urgency, vaginal bleeding, vaginal discharge and vaginal pain.  Musculoskeletal: Positive for arthralgias, back pain and gait problem. Negative for joint swelling.  Skin: Negative for rash.  Neurological: Negative for dizziness, syncope, speech difficulty, weakness, numbness and headaches.  Hematological: Negative for adenopathy.  Psychiatric/Behavioral: Negative for agitation, behavioral problems and dysphoric mood. The  patient is not nervous/anxious.        Objective:   Physical Exam  Constitutional: She is oriented to person, place, and time. She appears well-developed and well-nourished.  130/80  HENT:  Head: Normocephalic.  Right Ear: External ear normal.  Left Ear: External ear normal.  Mouth/Throat: Oropharynx is clear and moist.  Eyes: Conjunctivae and EOM are normal. Pupils are equal, round, and reactive to light.  Neck: Normal range of motion. Neck supple. No thyromegaly present.  Cardiovascular: Normal rate, regular rhythm, normal heart sounds and intact distal pulses.  Pulmonary/Chest: Effort normal. She has rales.  Abdominal: Soft. Bowel sounds are normal. She exhibits no mass. There is no tenderness.  Musculoskeletal: Normal range of motion.  Lymphadenopathy:    She has no cervical adenopathy.  Neurological: She is alert and oriented to person, place, and time.  Skin: Skin is warm and dry. No rash noted.  Psychiatric: She has a normal mood and affect. Her behavior is normal.          Assessment & Plan:   HTN- stable Primary HPT- check Calcium  CPX 6 months  Meds refilled  Nyoka Cowden

## 2017-11-21 NOTE — Patient Instructions (Signed)
Limit your sodium (Salt) intake  Dermatology consultation as discussed  Return in 6 months for follow-up

## 2018-03-29 ENCOUNTER — Other Ambulatory Visit: Payer: Self-pay | Admitting: Internal Medicine

## 2018-04-17 DIAGNOSIS — H353130 Nonexudative age-related macular degeneration, bilateral, stage unspecified: Secondary | ICD-10-CM | POA: Diagnosis not present

## 2018-04-17 DIAGNOSIS — H40003 Preglaucoma, unspecified, bilateral: Secondary | ICD-10-CM | POA: Diagnosis not present

## 2018-04-17 DIAGNOSIS — R6889 Other general symptoms and signs: Secondary | ICD-10-CM | POA: Diagnosis not present

## 2018-05-20 ENCOUNTER — Encounter: Payer: Self-pay | Admitting: Internal Medicine

## 2018-05-20 ENCOUNTER — Other Ambulatory Visit: Payer: Self-pay

## 2018-05-20 ENCOUNTER — Ambulatory Visit (INDEPENDENT_AMBULATORY_CARE_PROVIDER_SITE_OTHER): Payer: Medicare HMO | Admitting: Internal Medicine

## 2018-05-20 VITALS — BP 140/82 | HR 79 | Temp 98.0°F | Wt 192.0 lb

## 2018-05-20 DIAGNOSIS — R6889 Other general symptoms and signs: Secondary | ICD-10-CM | POA: Diagnosis not present

## 2018-05-20 DIAGNOSIS — I10 Essential (primary) hypertension: Secondary | ICD-10-CM

## 2018-05-20 DIAGNOSIS — K219 Gastro-esophageal reflux disease without esophagitis: Secondary | ICD-10-CM | POA: Diagnosis not present

## 2018-05-20 DIAGNOSIS — E210 Primary hyperparathyroidism: Secondary | ICD-10-CM | POA: Diagnosis not present

## 2018-05-20 LAB — CBC WITH DIFFERENTIAL/PLATELET
BASOS PCT: 0.2 % (ref 0.0–3.0)
Basophils Absolute: 0 10*3/uL (ref 0.0–0.1)
EOS ABS: 0.1 10*3/uL (ref 0.0–0.7)
EOS PCT: 0.8 % (ref 0.0–5.0)
HCT: 39.7 % (ref 36.0–46.0)
Hemoglobin: 13.4 g/dL (ref 12.0–15.0)
Lymphocytes Relative: 17.4 % (ref 12.0–46.0)
Lymphs Abs: 1.2 10*3/uL (ref 0.7–4.0)
MCHC: 33.7 g/dL (ref 30.0–36.0)
MCV: 87.7 fl (ref 78.0–100.0)
MONO ABS: 0.9 10*3/uL (ref 0.1–1.0)
Monocytes Relative: 13 % — ABNORMAL HIGH (ref 3.0–12.0)
NEUTROS ABS: 4.6 10*3/uL (ref 1.4–7.7)
NEUTROS PCT: 68.6 % (ref 43.0–77.0)
Platelets: 254 10*3/uL (ref 150.0–400.0)
RBC: 4.53 Mil/uL (ref 3.87–5.11)
RDW: 13.8 % (ref 11.5–15.5)
WBC: 6.6 10*3/uL (ref 4.0–10.5)

## 2018-05-20 LAB — BASIC METABOLIC PANEL
BUN: 19 mg/dL (ref 6–23)
CALCIUM: 11.2 mg/dL — AB (ref 8.4–10.5)
CO2: 30 mEq/L (ref 19–32)
CREATININE: 0.95 mg/dL (ref 0.40–1.20)
Chloride: 95 mEq/L — ABNORMAL LOW (ref 96–112)
GFR: 59.34 mL/min — AB (ref >60.00)
GLUCOSE: 99 mg/dL (ref 70–99)
Potassium: 5.2 mEq/L — ABNORMAL HIGH (ref 3.5–5.1)
SODIUM: 130 meq/L — AB (ref 135–145)

## 2018-05-20 LAB — TSH: TSH: 1.63 u[IU]/mL (ref 0.35–4.50)

## 2018-05-20 LAB — VITAMIN D 25 HYDROXY (VIT D DEFICIENCY, FRACTURES): VITD: 12.81 ng/mL — ABNORMAL LOW (ref 30.00–100.00)

## 2018-05-20 MED ORDER — FUROSEMIDE 40 MG PO TABS: 40.0000 mg | ORAL_TABLET | Freq: Every day | ORAL | 3 refills | Status: DC

## 2018-05-20 MED ORDER — AMLODIPINE BESYLATE 2.5 MG PO TABS: ORAL_TABLET | ORAL | 3 refills | Status: DC

## 2018-05-20 MED ORDER — BENAZEPRIL HCL 40 MG PO TABS: 40.0000 mg | ORAL_TABLET | Freq: Every day | ORAL | 3 refills | Status: DC

## 2018-05-20 MED ORDER — LORAZEPAM 0.5 MG PO TABS: 0.5000 mg | ORAL_TABLET | Freq: Two times a day (BID) | ORAL | 0 refills | Status: DC | PRN

## 2018-05-20 MED ORDER — LORAZEPAM 0.5 MG PO TABS: 0.5000 mg | ORAL_TABLET | Freq: Two times a day (BID) | ORAL | 2 refills | Status: DC | PRN

## 2018-05-20 NOTE — Patient Instructions (Signed)
Limit your sodium (Salt) intake  Please check your blood pressure on a regular basis.  If it is consistently greater than 150/90, please make an office appointment.  Maintain a good state of hydration  Follow-up in 3 to 6 months

## 2018-05-20 NOTE — Progress Notes (Signed)
Subjective:    Patient ID: Annette [name], female    DOB: May 17, 1930, 83 y.o.   MRN: [id number]  HPI  84 year old patient who is seen today for her 44-month follow-up.  She has a history of asymptomatic hypercalcemia secondary to primary hyperparathyroidism.  She continues to do well.  The patient has been resistant to check bone mineral density and bone density studies have been ordered on 3 prior occasions.  She continues to do well. She lives with her son. Lorazepam is on her medicine list that she states she likes to have on hand and takes only a couple of times per year. She has a history of GERD and remains on PPI therapy  She is followed by ophthalmology twice annually due to glaucoma   Past Medical History:  Diagnosis Date  . GERD 10/03/2007  . HYPERTENSION 10/03/2007  . OSTEOPENIA 02/03/2008  . Primary hyperparathyroidism ([place]) 10/03/2007     Social History   Socioeconomic History  . Marital status: Married    Spouse name: Not on file  . Number of children: Not on file  . Years of education: Not on file  . Highest education level: Not on file  Occupational History  . Not on file  Social Needs  . Financial resource strain: Not on file  . Food insecurity:    Worry: Not on file    Inability: Not on file  . Transportation needs:    Medical: Not on file    Non-medical: Not on file  Tobacco Use  . Smoking status: Former Smoker    Last attempt to quit: 11/12/1978    Years since quitting: 39.5  . Smokeless tobacco: Never Used  Substance and Sexual Activity  . Alcohol use: No  . Drug use: No  . Sexual activity: Not on file  Lifestyle  . Physical activity:    Days per week: Not on file    Minutes per session: Not on file  . Stress: Not on file  Relationships  . Social connections:    Talks on phone: Not on file    Gets together: Not on file    Attends religious service: Not on file    Active member of club or organization: Not on file    Attends meetings of  clubs or organizations: Not on file    Relationship status: Not on file  . Intimate partner violence:    Fear of current or ex partner: Not on file    Emotionally abused: Not on file    Physically abused: Not on file    Forced sexual activity: Not on file  Other Topics Concern  . Not on file  Social History Narrative  . Not on file    Past Surgical History:  Procedure Laterality Date  . ABDOMINAL HYSTERECTOMY      History reviewed. No pertinent family history.  Allergies  Allergen Reactions  . Codeine Nausea And Vomiting    Current Outpatient Medications on File Prior to Visit  Medication Sig Dispense Refill  . amLODipine (NORVASC) 2.5 MG tablet TAKE 1 TABLET EVERY DAY WITH BREAKFAST 90 tablet 3  . aspirin 81 MG chewable tablet Chew 81 mg by mouth daily.    . benazepril (LOTENSIN) 40 MG tablet TAKE 1 TABLET EVERY DAY 90 tablet 3  . furosemide (LASIX) 40 MG tablet TAKE 1 TABLET EVERY DAY 90 tablet 3  . ibuprofen (ADVIL,MOTRIN) 200 MG tablet Take 2 tablets (400 mg total) by mouth daily as needed for  moderate pain. 180 tablet 1  . latanoprost (XALATAN) 0.005 % ophthalmic solution INSTILL 1 DROP INTO BOTH EYES AT BEDTIME 7.5 mL 3  . meclizine (ANTIVERT) 25 MG tablet Take 1 tablet (25 mg total) by mouth 4 (four) times daily as needed. 90 tablet 3  . omeprazole (PRILOSEC) 20 MG capsule TAKE 1 CAPSULE EVERY DAY 90 capsule 3   No current facility-administered medications on file prior to visit.     BP 140/82 (BP Location: Left Arm, Patient Position: Sitting, Cuff Size: Large)   Pulse 79   Temp 98 F (36.7 C) (Oral)   Wt 192 lb (87.1 kg)   SpO2 99%   BMI 34.01 kg/m      Review of Systems  Constitutional: Negative.   HENT: Negative for congestion, dental problem, hearing loss, rhinorrhea, sinus pressure, sore throat and tinnitus.   Eyes: Negative for pain, discharge and visual disturbance.  Respiratory: Negative for cough and shortness of breath.   Cardiovascular:  Negative for chest pain, palpitations and leg swelling.  Gastrointestinal: Negative for abdominal distention, abdominal pain, blood in stool, constipation, diarrhea, nausea and vomiting.  Genitourinary: Negative for difficulty urinating, dysuria, flank pain, frequency, hematuria, pelvic pain, urgency, vaginal bleeding, vaginal discharge and vaginal pain.  Musculoskeletal: Positive for arthralgias, back pain and gait problem. Negative for joint swelling.  Skin: Negative for rash.  Neurological: Negative for dizziness, syncope, speech difficulty, weakness, numbness and headaches.  Hematological: Negative for adenopathy.  Psychiatric/Behavioral: Negative for agitation, behavioral problems and dysphoric mood. The patient is not nervous/anxious.        Objective:   Physical Exam  Constitutional: She is oriented to person, place, and time. She appears well-developed and well-nourished.  Weight 192 Blood pressure well controlled  HENT:  Head: Normocephalic.  Right Ear: External ear normal.  Left Ear: External ear normal.  Mouth/Throat: Oropharynx is clear and moist.  Eyes: Pupils are equal, round, and reactive to light. Conjunctivae and EOM are normal.  Neck: Normal range of motion. Neck supple. No thyromegaly present.  Cardiovascular: Normal rate, regular rhythm, normal heart sounds and intact distal pulses.  Pulmonary/Chest: Effort normal and breath sounds normal.  Abdominal: Soft. Bowel sounds are normal. She exhibits no mass. There is no tenderness.  Musculoskeletal: Normal range of motion.  Lymphadenopathy:    She has no cervical adenopathy.  Neurological: She is alert and oriented to person, place, and time.  Skin: Skin is warm and dry. No rash noted.  Psychiatric: She has a normal mood and affect. Her behavior is normal.          Assessment & Plan:   Primary hyperparathyroidism.  Will check calcium and renal indices Essential hypertension well-controlled GERD.  It was  suggestion the patient challenges self off PPI therapy  CPX with new provider in 6 months  Marletta Lor

## 2018-05-21 ENCOUNTER — Ambulatory Visit: Payer: Medicare HMO | Admitting: Internal Medicine

## 2018-05-21 LAB — PTH, INTACT AND CALCIUM
CALCIUM: 11.1 mg/dL — AB (ref 8.6–10.4)
PTH: 319 pg/mL — AB (ref 14–64)

## 2018-05-26 ENCOUNTER — Encounter: Payer: Self-pay | Admitting: Internal Medicine

## 2018-05-26 NOTE — Telephone Encounter (Signed)
Patient is calling to check on this message.

## 2018-06-11 ENCOUNTER — Encounter: Payer: Self-pay | Admitting: Internal Medicine

## 2018-07-04 ENCOUNTER — Other Ambulatory Visit: Payer: Self-pay | Admitting: Internal Medicine

## 2018-07-07 NOTE — Telephone Encounter (Signed)
This medication needs to be refilled by her ophthalmologist

## 2018-07-07 NOTE — Telephone Encounter (Signed)
Okay for refill? Please advise 

## 2018-07-22 ENCOUNTER — Emergency Department (HOSPITAL_COMMUNITY)
Admission: EM | Admit: 2018-07-22 | Discharge: 2018-07-23 | Disposition: A | Discharge status: Home / Self Care | Payer: Medicare HMO | Attending: Emergency Medicine | Admitting: Emergency Medicine

## 2018-07-22 ENCOUNTER — Encounter (HOSPITAL_COMMUNITY): Payer: Self-pay

## 2018-07-22 DIAGNOSIS — Z79899 Other long term (current) drug therapy: Secondary | ICD-10-CM | POA: Insufficient documentation

## 2018-07-22 DIAGNOSIS — K219 Gastro-esophageal reflux disease without esophagitis: Secondary | ICD-10-CM | POA: Insufficient documentation

## 2018-07-22 DIAGNOSIS — Z87891 Personal history of nicotine dependence: Secondary | ICD-10-CM | POA: Insufficient documentation

## 2018-07-22 DIAGNOSIS — E871 Hypo-osmolality and hyponatremia: Secondary | ICD-10-CM

## 2018-07-22 DIAGNOSIS — R110 Nausea: Secondary | ICD-10-CM | POA: Diagnosis not present

## 2018-07-22 DIAGNOSIS — R42 Dizziness and giddiness: Secondary | ICD-10-CM | POA: Diagnosis not present

## 2018-07-22 DIAGNOSIS — Z7982 Long term (current) use of aspirin: Secondary | ICD-10-CM | POA: Diagnosis not present

## 2018-07-22 DIAGNOSIS — R0902 Hypoxemia: Secondary | ICD-10-CM | POA: Diagnosis not present

## 2018-07-22 DIAGNOSIS — I10 Essential (primary) hypertension: Secondary | ICD-10-CM | POA: Insufficient documentation

## 2018-07-22 LAB — CBC WITH DIFFERENTIAL/PLATELET
BASOS PCT: 0 %
Basophils Absolute: 0 10*3/uL (ref 0.0–0.1)
Eosinophils Absolute: 0 10*3/uL (ref 0.0–0.7)
Eosinophils Relative: 0 %
HEMATOCRIT: 38.6 % (ref 36.0–46.0)
HEMOGLOBIN: 13 g/dL (ref 12.0–15.0)
Lymphocytes Relative: 9 %
Lymphs Abs: 0.8 10*3/uL (ref 0.7–4.0)
MCH: 29.5 pg (ref 26.0–34.0)
MCHC: 33.7 g/dL (ref 30.0–36.0)
MCV: 87.5 fL (ref 78.0–100.0)
MONOS PCT: 9 %
Monocytes Absolute: 0.8 10*3/uL (ref 0.1–1.0)
NEUTROS PCT: 82 %
Neutro Abs: 7.1 10*3/uL (ref 1.7–7.7)
Platelets: 263 10*3/uL (ref 150–400)
RBC: 4.41 MIL/uL (ref 3.87–5.11)
RDW: 13.5 % (ref 11.5–15.5)
WBC: 8.7 10*3/uL (ref 4.0–10.5)

## 2018-07-22 MED ORDER — METOCLOPRAMIDE HCL 5 MG/ML IJ SOLN
5.0000 mg | Freq: Once | INTRAMUSCULAR | Status: AC
  Administered 2018-07-22: 5 mg via INTRAVENOUS
  Filled 2018-07-22: qty 2

## 2018-07-22 NOTE — ED Notes (Signed)
Pt placed on Purewick 

## 2018-07-22 NOTE — ED Notes (Signed)
Pt aware that urine sample is needed.  

## 2018-07-22 NOTE — ED Notes (Signed)
Bed: YP15 Expected date:  Expected time:  Means of arrival:  Comments: 84 yr old epigastric pain

## 2018-07-22 NOTE — ED Notes (Signed)
ED Provider at bedside. 

## 2018-07-22 NOTE — ED Triage Notes (Signed)
Pt arrived by EMS from home. Pt reports indigestion about 6 hours prior to arrival. Pt hypertension on arrival.

## 2018-07-22 NOTE — ED Provider Notes (Signed)
[place] DEPT Provider Note   CSN: [id number] Arrival date & time: 07/22/18  2205     History   Chief Complaint Chief Complaint  Patient presents with  . Gastroesophageal Reflux    HPI Annette [name] is a 84 y.o. female.  Patient here from home for complaint of nausea that started earlier today. She notes she is belching more than her usual. She has "nausea" that goes from the epigastric stomach to the throat. No pain. No SOB, diaphoresis, vomiting. She thought her blood pressure might be low so she ate some potato chips to try to bring it up and now feels worse. Her pressure was later checked and found to be significantly elevated. She called EMS about 6 hours ago but refused transport at that time but called them again when symptoms persisted. She takes omeprazole for GERD. Other medical history includes HTN, HLD, and vertigo.   The history is provided by the patient and the EMS personnel. No language interpreter was used.    Past Medical History:  Diagnosis Date  . GERD 10/03/2007  . HYPERTENSION 10/03/2007  . OSTEOPENIA 02/03/2008  . Primary hyperparathyroidism ([place]) 10/03/2007    Patient Active Problem List   Diagnosis Date Noted  . Disorder of bone and cartilage 02/03/2008  . PRIMARY HYPERPARATHYROIDISM 10/03/2007  . Essential hypertension 10/03/2007  . GERD 10/03/2007    Past Surgical History:  Procedure Laterality Date  . ABDOMINAL HYSTERECTOMY       OB History   None      Home Medications    Prior to Admission medications   Medication Sig Start Date End Date Taking? Authorizing Provider  amLODipine (NORVASC) 2.5 MG tablet TAKE 1 TABLET EVERY DAY WITH BREAKFAST 05/20/18   Marletta Lor, MD  aspirin 81 MG chewable tablet Chew 81 mg by mouth daily.    [provider]  benazepril (LOTENSIN) 40 MG tablet Take 1 tablet (40 mg total) by mouth daily. 05/20/18   Marletta Lor, MD  furosemide (LASIX) 40 MG  tablet Take 1 tablet (40 mg total) by mouth daily. 05/20/18   Marletta Lor, MD  ibuprofen (ADVIL,MOTRIN) 200 MG tablet Take 2 tablets (400 mg total) by mouth daily as needed for moderate pain. 01/19/15   Marletta Lor, MD  latanoprost (XALATAN) 0.005 % ophthalmic solution INSTILL 1 DROP INTO BOTH EYES AT BEDTIME 10/29/17   Marletta Lor, MD  LORazepam (ATIVAN) 0.5 MG tablet Take 1 tablet (0.5 mg total) by mouth 2 (two) times daily as needed. for anxiety 05/20/18   Marletta Lor, MD  meclizine (ANTIVERT) 25 MG tablet Take 1 tablet (25 mg total) by mouth 4 (four) times daily as needed. 12/27/14   Marletta Lor, MD  omeprazole (PRILOSEC) 20 MG capsule TAKE 1 CAPSULE EVERY DAY 10/16/17   Marletta Lor, MD    Family History History reviewed. No pertinent family history.  Social History Social History   Tobacco Use  . Smoking status: Former Smoker    Last attempt to quit: 11/12/1978    Years since quitting: 39.7  . Smokeless tobacco: Never Used  Substance Use Topics  . Alcohol use: No  . Drug use: No     Allergies   Codeine   Review of Systems Review of Systems  Constitutional: Negative for chills and fever.  HENT: Negative.   Respiratory: Negative.  Negative for shortness of breath.   Cardiovascular: Negative.  Negative for chest pain.  Gastrointestinal:  Positive for nausea. Negative for abdominal pain and vomiting.  Musculoskeletal: Negative.  Negative for back pain.  Skin: Negative.   Neurological: Negative.  Negative for weakness and light-headedness.     Physical Exam Updated Vital Signs BP (!) 171/85   Pulse 71   Temp (!) 97.5 F (36.4 C)   Resp 17   Ht 5\' 3"  (1.6 m)   Wt 87.1 kg   SpO2 93%   BMI 34.01 kg/m   Physical Exam  Constitutional: She is oriented to person, place, and time. She appears well-developed and well-nourished. No distress.  HENT:  Head: Normocephalic.  Neck: Normal range of motion. Neck supple.    Cardiovascular: Normal rate and regular rhythm.  Pulmonary/Chest: Effort normal and breath sounds normal. She has no wheezes. She has no rales.  Abdominal: Soft. Bowel sounds are normal. There is no tenderness. There is no rebound and no guarding.  Musculoskeletal: Normal range of motion.  Neurological: She is alert and oriented to person, place, and time.  Skin: Skin is warm and dry. No rash noted.  Psychiatric: She has a normal mood and affect.  Nursing note and vitals reviewed.    ED Treatments / Results  Labs (all labs ordered are listed, but only abnormal results are displayed) Labs Reviewed  COMPREHENSIVE METABOLIC PANEL - Abnormal; Notable for the following components:      Result Value   Sodium 127 (*)    Chloride 94 (*)    Glucose, Bld 114 (*)    Calcium 10.6 (*)    Total Protein 6.3 (*)    GFR calc non Af Amer 50 (*)    GFR calc Af Amer 58 (*)    All other components within normal limits  URINE CULTURE  CBC WITH DIFFERENTIAL/PLATELET  TROPONIN I  LIPASE, BLOOD  URINALYSIS, ROUTINE W REFLEX MICROSCOPIC   Results for orders placed or performed during the hospital encounter of 07/22/18  CBC with Differential  Result Value Ref Range   WBC 8.7 4.0 - 10.5 K/uL   RBC 4.41 3.87 - 5.11 MIL/uL   Hemoglobin 13.0 12.0 - 15.0 g/dL   HCT 38.6 36.0 - 46.0 %   MCV 87.5 78.0 - 100.0 fL   MCH 29.5 26.0 - 34.0 pg   MCHC 33.7 30.0 - 36.0 g/dL   RDW 13.5 11.5 - 15.5 %   Platelets 263 150 - 400 K/uL   Neutrophils Relative % 82 %   Neutro Abs 7.1 1.7 - 7.7 K/uL   Lymphocytes Relative 9 %   Lymphs Abs 0.8 0.7 - 4.0 K/uL   Monocytes Relative 9 %   Monocytes Absolute 0.8 0.1 - 1.0 K/uL   Eosinophils Relative 0 %   Eosinophils Absolute 0.0 0.0 - 0.7 K/uL   Basophils Relative 0 %   Basophils Absolute 0.0 0.0 - 0.1 K/uL  Troponin I  Result Value Ref Range   Troponin I <0.03 <0.03 ng/mL  Comprehensive metabolic panel  Result Value Ref Range   Sodium 127 (L) 135 - 145 mmol/L    Potassium 4.9 3.5 - 5.1 mmol/L   Chloride 94 (L) 98 - 111 mmol/L   CO2 24 22 - 32 mmol/L   Glucose, Bld 114 (H) 70 - 99 mg/dL   BUN 23 8 - 23 mg/dL   Creatinine, Ser 1.00 0.44 - 1.00 mg/dL   Calcium 10.6 (H) 8.9 - 10.3 mg/dL   Total Protein 6.3 (L) 6.5 - 8.1 g/dL   Albumin 3.7 3.5 - 5.0  g/dL   AST 27 15 - 41 U/L   ALT 10 0 - 44 U/L   Alkaline Phosphatase 79 38 - 126 U/L   Total Bilirubin 1.2 0.3 - 1.2 mg/dL   GFR calc non Af Amer 50 (L) >60 mL/min   GFR calc Af Amer 58 (L) >60 mL/min   Anion gap 9 5 - 15  Lipase, blood  Result Value Ref Range   Lipase 26 11 - 51 U/L  Urinalysis, Routine w reflex microscopic  Result Value Ref Range   Color, Urine YELLOW YELLOW   APPearance CLEAR CLEAR   Specific Gravity, Urine 1.019 1.005 - 1.030   pH 5.0 5.0 - 8.0   Glucose, UA NEGATIVE NEGATIVE mg/dL   Hgb urine dipstick NEGATIVE NEGATIVE   Bilirubin Urine NEGATIVE NEGATIVE   Ketones, ur NEGATIVE NEGATIVE mg/dL   Protein, ur NEGATIVE NEGATIVE mg/dL   Nitrite NEGATIVE NEGATIVE   Leukocytes, UA NEGATIVE NEGATIVE    EKG EKG Interpretation  Date/Time:  Tuesday July 22 2018 22:44:32 EDT Ventricular Rate:  63 PR Interval:    QRS Duration: 86 QT Interval:  386 QTC Calculation: 396 R Axis:   -6 Text Interpretation:  Sinus rhythm Probable left atrial enlargement Anteroseptal infarct, old No significant change since last tracing Confirmed by Pryor Curia [phone]) on 07/23/2018 1:18:25 AM   Radiology Dg Chest 2 View  Result Date: 07/23/2018 CLINICAL DATA:  Nausea. EXAM: CHEST - 2 VIEW COMPARISON:  11/11/2016 FINDINGS: The cardiomediastinal contours are normal for AP technique. The lungs are clear. Mild chronic hyperinflation. Pulmonary vasculature is normal. No consolidation, pleural effusion, or pneumothorax. No acute osseous abnormalities are seen. IMPRESSION: No acute findings or explanation for nausea. Electronically Signed   By: Keith Rake M.D.   On: 07/23/2018 01:03     Procedures Procedures (including critical care time)  Medications Ordered in ED Medications  metoCLOPramide (REGLAN) injection 5 mg (5 mg Intravenous Given 07/22/18 2309)  sodium chloride 0.9 % bolus 500 mL (500 mLs Intravenous New Bag/Given 07/23/18 0110)     Initial Impression / Assessment and Plan / ED Course  I have reviewed the triage vital signs and the nursing notes.  Pertinent labs & imaging results that were available during my care of the patient were reviewed by me and considered in my medical decision making (see chart for details).     Patient here with symptoms of nausea that "goes to my throat" (?globus), and subjective abdominal distention with increased belching. She is hypertensive here. Denies CP, SOB, diaphoresis.   She has a history of HTN, HLD. Will get cardiac evaluation, monitor elevated blood pressure and provide symptomatic treatment of nausea.  The patient reports that she belched 2-3 times even prior to being given Reglan and this resolved her symptoms completely.   Labs are unremarkable except for sodium of 127. No evidence of infection, ACS or metabolic abnormality. Blood pressure mildly elevated persistently. This, along with her low sodium, will need to be rechecked with primary care this week. No further intervention needed tonight.   The patient is seen and evaluated by Dr. Leonides Schanz. She is felt appropriate for discharge home, which the patient prefers.   Final Clinical Impressions(s) / ED Diagnoses   Final diagnoses:  Gastroesophageal reflux disease without esophagitis  Essential hypertension  Hyponatremia    ED Discharge Orders    None       Dennie Bible 07/23/18 4782    Milton Ferguson, MD 07/23/18 1431

## 2018-07-23 ENCOUNTER — Ambulatory Visit: Payer: Self-pay | Admitting: *Deleted

## 2018-07-23 ENCOUNTER — Emergency Department (HOSPITAL_COMMUNITY): Payer: Medicare HMO

## 2018-07-23 DIAGNOSIS — R110 Nausea: Secondary | ICD-10-CM | POA: Diagnosis not present

## 2018-07-23 LAB — URINALYSIS, ROUTINE W REFLEX MICROSCOPIC
BILIRUBIN URINE: NEGATIVE
Glucose, UA: NEGATIVE mg/dL
Hgb urine dipstick: NEGATIVE
KETONES UR: NEGATIVE mg/dL
Leukocytes, UA: NEGATIVE
NITRITE: NEGATIVE
PH: 5 (ref 5.0–8.0)
Protein, ur: NEGATIVE mg/dL
Specific Gravity, Urine: 1.019 (ref 1.005–1.030)

## 2018-07-23 LAB — COMPREHENSIVE METABOLIC PANEL
ALK PHOS: 79 U/L (ref 38–126)
ALT: 10 U/L (ref 0–44)
ANION GAP: 9 (ref 5–15)
AST: 27 U/L (ref 15–41)
Albumin: 3.7 g/dL (ref 3.5–5.0)
BILIRUBIN TOTAL: 1.2 mg/dL (ref 0.3–1.2)
BUN: 23 mg/dL (ref 8–23)
CALCIUM: 10.6 mg/dL — AB (ref 8.9–10.3)
CO2: 24 mmol/L (ref 22–32)
Chloride: 94 mmol/L — ABNORMAL LOW (ref 98–111)
Creatinine, Ser: 1 mg/dL (ref 0.44–1.00)
GFR calc non Af Amer: 50 mL/min — ABNORMAL LOW (ref >60)
GFR, EST AFRICAN AMERICAN: 58 mL/min — AB (ref >60)
GLUCOSE: 114 mg/dL — AB (ref 70–99)
Potassium: 4.9 mmol/L (ref 3.5–5.1)
Sodium: 127 mmol/L — ABNORMAL LOW (ref 135–145)
Total Protein: 6.3 g/dL — ABNORMAL LOW (ref 6.5–8.1)

## 2018-07-23 LAB — TROPONIN I: Troponin I: <0.03 ng/mL (ref <0.03)

## 2018-07-23 LAB — LIPASE, BLOOD: Lipase: 26 U/L (ref 11–51)

## 2018-07-23 MED ORDER — SODIUM CHLORIDE 0.9 % IV BOLUS
500.0000 mL | Freq: Once | INTRAVENOUS | Status: AC
  Administered 2018-07-23: 500 mL via INTRAVENOUS

## 2018-07-23 NOTE — Telephone Encounter (Addendum)
Pt's daughter in law, Mallory Shirk, called because her blood pressure was 215/131 at 0200 07/23/18 at [facility]; they would like a ED follow up visit; recommendations made per nurse triage protocol; spoke with [facility] and [facility]; pt scheduled, per [facility], with Dr Inda Merlin 07/24/18 at 1100; she verbalizes understanding; will route to office for notification of this upcoming appointment.

## 2018-07-23 NOTE — Telephone Encounter (Signed)
Please advise 

## 2018-07-23 NOTE — Telephone Encounter (Signed)
  Reason for Disposition . Systolic BP  >= 696 OR Diastolic >= 295  Answer Assessment - Initial Assessment Questions 1. BLOOD PRESSURE: "What is the blood pressure?" "Did you take at least two measurements 5 minutes apart?"     215/131 2. ONSET: "When did you take your blood pressure?"     07/22/18 3. HOW: "How did you obtain the blood pressure?" (e.g., visiting nurse, automatic home BP monitor)     Obtained in ED at [facility], per pt's daughter in law it was taken 07/23/18 at 0200 4. HISTORY: "Do you have a history of high blood pressure?"     yes 5. MEDICATIONS: "Are you taking any medications for blood pressure?" "Have you missed any doses recently?"     no 6. OTHER SYMPTOMS: "Do you have any symptoms?" (e.g., headache, chest pain, blurred vision, difficulty breathing, weakness)     none 7. PREGNANCY: "Is there any chance you are pregnant?" "When was your last menstrual period?"     no  Protocols used: HIGH BLOOD PRESSURE-A-AH

## 2018-07-23 NOTE — ED Notes (Signed)
Patient transported to X-ray 

## 2018-07-23 NOTE — Discharge Instructions (Addendum)
Continue your regular medications for reflux and for high blood pressure. Follow up with your doctor for recheck of your blood pressure and low sodium this week to discuss possible further changes to your Lasix (furosemide) dosing. REturn here as needed.

## 2018-07-24 ENCOUNTER — Ambulatory Visit (INDEPENDENT_AMBULATORY_CARE_PROVIDER_SITE_OTHER): Payer: Medicare HMO | Admitting: Internal Medicine

## 2018-07-24 ENCOUNTER — Encounter: Payer: Self-pay | Admitting: Internal Medicine

## 2018-07-24 VITALS — BP 120/70 | HR 77 | Temp 98.2°F | Wt 189.2 lb

## 2018-07-24 DIAGNOSIS — I10 Essential (primary) hypertension: Secondary | ICD-10-CM | POA: Diagnosis not present

## 2018-07-24 DIAGNOSIS — K219 Gastro-esophageal reflux disease without esophagitis: Secondary | ICD-10-CM | POA: Diagnosis not present

## 2018-07-24 DIAGNOSIS — E871 Hypo-osmolality and hyponatremia: Secondary | ICD-10-CM | POA: Diagnosis not present

## 2018-07-24 DIAGNOSIS — E210 Primary hyperparathyroidism: Secondary | ICD-10-CM

## 2018-07-24 DIAGNOSIS — Z23 Encounter for immunization: Secondary | ICD-10-CM | POA: Diagnosis not present

## 2018-07-24 LAB — URINE CULTURE

## 2018-07-24 LAB — BASIC METABOLIC PANEL
BUN: 12 mg/dL (ref 6–23)
CALCIUM: 11.3 mg/dL — AB (ref 8.4–10.5)
CO2: 26 meq/L (ref 19–32)
Chloride: 94 mEq/L — ABNORMAL LOW (ref 96–112)
Creatinine, Ser: 0.95 mg/dL (ref 0.40–1.20)
GFR: 59.32 mL/min — AB (ref >60.00)
GLUCOSE: 94 mg/dL (ref 70–99)
POTASSIUM: 4.7 meq/L (ref 3.5–5.1)
SODIUM: 127 meq/L — AB (ref 135–145)

## 2018-07-24 MED ORDER — OMEPRAZOLE 20 MG PO CPDR: 20.0000 mg | DELAYED_RELEASE_CAPSULE | Freq: Every day | ORAL | 3 refills | Status: DC

## 2018-07-24 MED ORDER — FUROSEMIDE 40 MG PO TABS: 40.0000 mg | ORAL_TABLET | Freq: Every day | ORAL | 3 refills | Status: DC

## 2018-07-24 MED ORDER — BENAZEPRIL HCL 40 MG PO TABS: 40.0000 mg | ORAL_TABLET | Freq: Every day | ORAL | 3 refills | Status: DC

## 2018-07-24 MED ORDER — LORAZEPAM 0.5 MG PO TABS: 0.5000 mg | ORAL_TABLET | Freq: Two times a day (BID) | ORAL | 1 refills | Status: DC | PRN

## 2018-07-24 MED ORDER — AMLODIPINE BESYLATE 2.5 MG PO TABS: ORAL_TABLET | ORAL | 3 refills | Status: DC

## 2018-07-24 NOTE — Patient Instructions (Signed)
Limit your sodium (Salt) intake  Please check your blood pressure on a regular basis.  If it is consistently greater than 150/90, please make an office appointment.  Return in 3 months for follow-up  Avoids foods high in acid such as tomatoes citrus juices, and spicy foods.  Avoid eating within two hours of lying down or before exercising.  Do not overheat.  Try smaller more frequent meals.  If symptoms persist, elevate the head of her bed 12 inches while sleeping.  Continue omeprazole 20 mg daily

## 2018-07-24 NOTE — Progress Notes (Signed)
Subjective:    Patient ID: Annette [name], female    DOB: 06/24/1930, 84 y.o.   MRN: [id number]  HPI  84 year old patient who has a history of hypertension as well as primary hyperparathyroidism.  She was seen in the ED 2 days ago with complaints of dizziness ringing the ears and reflux symptoms.  Blood pressure was noted to be quite elevated. She has been on amlodipine and benazepril as well as furosemide 3 times per week. Today she feels improved;  blood pressure today is low normal  ED evaluation revealed some hyponatremia.  Calcium level was stable  Past Medical History:  Diagnosis Date  . GERD 10/03/2007  . HYPERTENSION 10/03/2007  . OSTEOPENIA 02/03/2008  . Primary hyperparathyroidism ([place]) 10/03/2007     Social History   Socioeconomic History  . Marital status: Married    Spouse name: Not on file  . Number of children: Not on file  . Years of education: Not on file  . Highest education level: Not on file  Occupational History  . Not on file  Social Needs  . Financial resource strain: Not on file  . Food insecurity:    Worry: Not on file    Inability: Not on file  . Transportation needs:    Medical: Not on file    Non-medical: Not on file  Tobacco Use  . Smoking status: Former Smoker    Last attempt to quit: 11/12/1978    Years since quitting: 39.7  . Smokeless tobacco: Never Used  Substance and Sexual Activity  . Alcohol use: No  . Drug use: No  . Sexual activity: Not on file  Lifestyle  . Physical activity:    Days per week: Not on file    Minutes per session: Not on file  . Stress: Not on file  Relationships  . Social connections:    Talks on phone: Not on file    Gets together: Not on file    Attends religious service: Not on file    Active member of club or organization: Not on file    Attends meetings of clubs or organizations: Not on file    Relationship status: Not on file  . Intimate partner violence:    Fear of current or ex partner: Not  on file    Emotionally abused: Not on file    Physically abused: Not on file    Forced sexual activity: Not on file  Other Topics Concern  . Not on file  Social History Narrative  . Not on file    Past Surgical History:  Procedure Laterality Date  . ABDOMINAL HYSTERECTOMY      History reviewed. No pertinent family history.  Allergies  Allergen Reactions  . Codeine Nausea And Vomiting    Current Outpatient Medications on File Prior to Visit  Medication Sig Dispense Refill  . amLODipine (NORVASC) 2.5 MG tablet TAKE 1 TABLET EVERY DAY WITH BREAKFAST 90 tablet 3  . aspirin 81 MG chewable tablet Chew 81 mg by mouth daily.    . benazepril (LOTENSIN) 40 MG tablet Take 1 tablet (40 mg total) by mouth daily. 90 tablet 3  . furosemide (LASIX) 40 MG tablet Take 1 tablet (40 mg total) by mouth daily. 90 tablet 3  . ibuprofen (ADVIL,MOTRIN) 200 MG tablet Take 2 tablets (400 mg total) by mouth daily as needed for moderate pain. 180 tablet 1  . latanoprost (XALATAN) 0.005 % ophthalmic solution INSTILL 1 DROP INTO BOTH EYES AT BEDTIME  7.5 mL 3  . LORazepam (ATIVAN) 0.5 MG tablet Take 1 tablet (0.5 mg total) by mouth 2 (two) times daily as needed. for anxiety 30 tablet 0  . meclizine (ANTIVERT) 25 MG tablet Take 1 tablet (25 mg total) by mouth 4 (four) times daily as needed. 90 tablet 3  . omeprazole (PRILOSEC) 20 MG capsule TAKE 1 CAPSULE EVERY DAY 90 capsule 3   No current facility-administered medications on file prior to visit.     BP 120/70 (BP Location: Right Arm, Patient Position: Sitting, Cuff Size: Large)   Pulse 77   Temp 98.2 F (36.8 C) (Oral)   Wt 189 lb 3.2 oz (85.8 kg)   SpO2 97%   BMI 33.52 kg/m     Review of Systems  Constitutional: Negative.   HENT: Positive for tinnitus. Negative for congestion, dental problem, hearing loss, rhinorrhea, sinus pressure and sore throat.   Eyes: Negative for pain, discharge and visual disturbance.  Respiratory: Negative for cough  and shortness of breath.   Cardiovascular: Negative for chest pain, palpitations and leg swelling.  Gastrointestinal: Positive for abdominal distention and abdominal pain. Negative for blood in stool, constipation, diarrhea, nausea and vomiting.  Genitourinary: Negative for difficulty urinating, dysuria, flank pain, frequency, hematuria, pelvic pain, urgency, vaginal bleeding, vaginal discharge and vaginal pain.  Musculoskeletal: Negative for arthralgias, gait problem and joint swelling.  Skin: Negative for rash.  Neurological: Positive for dizziness and light-headedness. Negative for syncope, speech difficulty, weakness, numbness and headaches.  Hematological: Negative for adenopathy.  Psychiatric/Behavioral: Negative for agitation, behavioral problems and dysphoric mood. The patient is not nervous/anxious.        Objective:   Physical Exam  Constitutional: She is oriented to person, place, and time. She appears well-developed and well-nourished.  Blood pressure 118/68 right arm  HENT:  Head: Normocephalic.  Right Ear: External ear normal.  Left Ear: External ear normal.  Mouth/Throat: Oropharynx is clear and moist.  Eyes: Pupils are equal, round, and reactive to light. Conjunctivae and EOM are normal.  Neck: Normal range of motion. Neck supple. No thyromegaly present.  Cardiovascular: Normal rate, regular rhythm, normal heart sounds and intact distal pulses.  Pulmonary/Chest: Effort normal and breath sounds normal.  Abdominal: Soft. Bowel sounds are normal. She exhibits no mass. There is no tenderness.  Musculoskeletal: Normal range of motion. She exhibits no edema.  Lymphadenopathy:    She has no cervical adenopathy.  Neurological: She is alert and oriented to person, place, and time.  Skin: Skin is warm and dry. No rash noted.  Psychiatric: She has a normal mood and affect. Her behavior is normal.          Assessment & Plan:  Essential hypertension.  Blood pressure has been  labile and quite elevated in the ED setting.  The pressure today is in a low normal range.  No change in medical regimen Hyponatremia.  The patient received IV fluids in the ED.  We will follow-up electrolytes. Primary hyperparathyroidism.  Stable calcium level Gastrosoft reflux disease.  Antireflux diet encouraged.  Continue PPI therapy  Medications updated Follow-up 3 months or as needed Review Bmet  Flu vaccine administered  Marletta Lor

## 2018-07-25 NOTE — Telephone Encounter (Signed)
Left message to return phone call.

## 2018-07-25 NOTE — Telephone Encounter (Signed)
Pt blood pressure was 127/82 Hr 89 @ 9:30 am. Pt stated she is eating chic fil a chicken sandwhiches gravy, eggs corn flakes and bacon.  Pt was advise to eat a healthier diet.

## 2018-08-01 ENCOUNTER — Ambulatory Visit: Payer: Medicare HMO | Admitting: Family Medicine

## 2018-08-04 NOTE — Progress Notes (Signed)
Noted  

## 2018-08-11 ENCOUNTER — Telehealth: Payer: Self-pay | Admitting: Internal Medicine

## 2018-08-11 NOTE — Telephone Encounter (Signed)
Copied from [place] [id number]. Topic: General - Other >> Aug 08, 2018  5:04 PM Rutherford Nail, NT wrote: Reason for CRM: patient's daughter in law, Joycelyn Schmid, is wanting to be removed from the patient's DPR. I was unsure how to go about this. States that it should just be the patient's son, Elta Guadeloupe, on the DPRCB#: [phone]

## 2018-08-12 NOTE — Telephone Encounter (Signed)
Noted! Joycelyn Schmid has been removed.  Do I need to do anything else?  Please advise

## 2018-08-14 ENCOUNTER — Other Ambulatory Visit: Payer: Self-pay

## 2018-08-14 ENCOUNTER — Emergency Department (HOSPITAL_COMMUNITY): Payer: Medicare HMO

## 2018-08-14 ENCOUNTER — Emergency Department (HOSPITAL_COMMUNITY)
Admission: EM | Admit: 2018-08-14 | Discharge: 2018-08-14 | Disposition: A | Discharge status: Home / Self Care | Payer: Medicare HMO | Attending: Emergency Medicine | Admitting: Emergency Medicine

## 2018-08-14 ENCOUNTER — Encounter (HOSPITAL_COMMUNITY): Payer: Self-pay | Admitting: Emergency Medicine

## 2018-08-14 DIAGNOSIS — I10 Essential (primary) hypertension: Secondary | ICD-10-CM | POA: Insufficient documentation

## 2018-08-14 DIAGNOSIS — E213 Hyperparathyroidism, unspecified: Secondary | ICD-10-CM | POA: Insufficient documentation

## 2018-08-14 DIAGNOSIS — I959 Hypotension, unspecified: Secondary | ICD-10-CM | POA: Diagnosis not present

## 2018-08-14 DIAGNOSIS — I440 Atrioventricular block, first degree: Secondary | ICD-10-CM | POA: Diagnosis not present

## 2018-08-14 DIAGNOSIS — R0902 Hypoxemia: Secondary | ICD-10-CM | POA: Diagnosis not present

## 2018-08-14 DIAGNOSIS — Z79899 Other long term (current) drug therapy: Secondary | ICD-10-CM | POA: Insufficient documentation

## 2018-08-14 DIAGNOSIS — Z87891 Personal history of nicotine dependence: Secondary | ICD-10-CM | POA: Insufficient documentation

## 2018-08-14 DIAGNOSIS — R55 Syncope and collapse: Secondary | ICD-10-CM | POA: Insufficient documentation

## 2018-08-14 DIAGNOSIS — R531 Weakness: Secondary | ICD-10-CM | POA: Diagnosis not present

## 2018-08-14 DIAGNOSIS — R001 Bradycardia, unspecified: Secondary | ICD-10-CM | POA: Diagnosis not present

## 2018-08-14 LAB — URINALYSIS, ROUTINE W REFLEX MICROSCOPIC
BILIRUBIN URINE: NEGATIVE
GLUCOSE, UA: NEGATIVE mg/dL
HGB URINE DIPSTICK: NEGATIVE
KETONES UR: NEGATIVE mg/dL
LEUKOCYTES UA: NEGATIVE
Nitrite: NEGATIVE
PH: 5 (ref 5.0–8.0)
Protein, ur: NEGATIVE mg/dL
Specific Gravity, Urine: 1.013 (ref 1.005–1.030)

## 2018-08-14 LAB — CBC
HCT: 42.9 % (ref 36.0–46.0)
Hemoglobin: 13.7 g/dL (ref 12.0–15.0)
MCH: 29.1 pg (ref 26.0–34.0)
MCHC: 31.9 g/dL (ref 30.0–36.0)
MCV: 91.1 fL (ref 78.0–100.0)
PLATELETS: 257 10*3/uL (ref 150–400)
RBC: 4.71 MIL/uL (ref 3.87–5.11)
RDW: 13.3 % (ref 11.5–15.5)
WBC: 6.9 10*3/uL (ref 4.0–10.5)

## 2018-08-14 LAB — HEPATIC FUNCTION PANEL
ALK PHOS: 64 U/L (ref 38–126)
ALT: 11 U/L (ref 0–44)
AST: 17 U/L (ref 15–41)
Albumin: 3.5 g/dL (ref 3.5–5.0)
BILIRUBIN INDIRECT: 0.4 mg/dL (ref 0.3–0.9)
Bilirubin, Direct: 0.2 mg/dL (ref 0.0–0.2)
TOTAL PROTEIN: 6 g/dL — AB (ref 6.5–8.1)
Total Bilirubin: 0.6 mg/dL (ref 0.3–1.2)

## 2018-08-14 LAB — I-STAT TROPONIN, ED: Troponin i, poc: 0 ng/mL (ref 0.00–0.08)

## 2018-08-14 LAB — LIPASE, BLOOD: LIPASE: 21 U/L (ref 11–51)

## 2018-08-14 LAB — CBG MONITORING, ED: Glucose-Capillary: 118 mg/dL — ABNORMAL HIGH (ref 70–99)

## 2018-08-14 LAB — BASIC METABOLIC PANEL
Anion gap: 8 (ref 5–15)
BUN: 18 mg/dL (ref 8–23)
CALCIUM: 10.8 mg/dL — AB (ref 8.9–10.3)
CO2: 23 mmol/L (ref 22–32)
CREATININE: 1.12 mg/dL — AB (ref 0.44–1.00)
Chloride: 104 mmol/L (ref 98–111)
GFR calc Af Amer: 50 mL/min — ABNORMAL LOW (ref >60)
GFR calc non Af Amer: 44 mL/min — ABNORMAL LOW (ref >60)
GLUCOSE: 136 mg/dL — AB (ref 70–99)
Potassium: 3.2 mmol/L — ABNORMAL LOW (ref 3.5–5.1)
Sodium: 135 mmol/L (ref 135–145)

## 2018-08-14 MED ORDER — ACETAMINOPHEN 325 MG PO TABS
650.0000 mg | ORAL_TABLET | Freq: Once | ORAL | Status: AC
  Administered 2018-08-14: 650 mg via ORAL
  Filled 2018-08-14: qty 2

## 2018-08-14 MED ORDER — SODIUM CHLORIDE 0.9 % IV BOLUS
1000.0000 mL | Freq: Once | INTRAVENOUS | Status: AC
  Administered 2018-08-14: 1000 mL via INTRAVENOUS

## 2018-08-14 MED ORDER — ONDANSETRON HCL 4 MG/2ML IJ SOLN
4.0000 mg | Freq: Once | INTRAMUSCULAR | Status: AC
  Administered 2018-08-14: 4 mg via INTRAVENOUS
  Filled 2018-08-14: qty 2

## 2018-08-14 NOTE — ED Triage Notes (Signed)
Per [facility] pt coming from home where she lives with son after having witnessed syncopal episode. Son states pt was sitting in her walker chair and did not fall. Pt initial BP was in the 60s after 500CC NS BP 114/54. Pt adds she has felt weak and nauseated but states she had not eaten breakfast yet. 4 mg zofran given en route.

## 2018-08-14 NOTE — Telephone Encounter (Signed)
Pt will need to complete new DPR at next OV.

## 2018-08-14 NOTE — ED Provider Notes (Signed)
[place] [facility] EMERGENCY DEPARTMENT Provider Note   CSN: [id number] Arrival date & time: 08/14/18  1256     History   Chief Complaint Chief Complaint  Patient presents with  . Loss of Consciousness    HPI Annette [name] is a 84 y.o. female who presents with cc of Presyncope.  The past medical history of esophageal reflux, hypertension, and primary hyperparathyroidism.  Patient states that this morning that she was feeling nauseous when she awoke.  She was making some oatmeal.  She went to sit down talk with her son in the chair when she suddenly felt like she was going to pass out.  She became very weak and lightheaded.  She denies chest pain.  She has had some nausea and reflux symptoms all day.  She was seen for nausea and reflux on 07/22/2018 and also was noted to have mild hyponatremia at 127.  Patient thinks that she feels like she is going to pass out because of her sodium being low.  She denies chest pain or abdominal pain.  Has no new medication changes.  She denies fevers, chills, cough, urinary symptoms.  HPI  Past Medical History:  Diagnosis Date  . GERD 10/03/2007  . HYPERTENSION 10/03/2007  . OSTEOPENIA 02/03/2008  . Primary hyperparathyroidism ([place]) 10/03/2007    Patient Active Problem List   Diagnosis Date Noted  . Disorder of bone and cartilage 02/03/2008  . PRIMARY HYPERPARATHYROIDISM 10/03/2007  . Essential hypertension 10/03/2007  . GERD 10/03/2007    Past Surgical History:  Procedure Laterality Date  . ABDOMINAL HYSTERECTOMY       OB History   None      Home Medications    Prior to Admission medications   Medication Sig Start Date End Date Taking? Authorizing Provider  amLODipine (NORVASC) 2.5 MG tablet TAKE 1 TABLET EVERY DAY WITH BREAKFAST 07/24/18   Marletta Lor, MD  aspirin 81 MG chewable tablet Chew 81 mg by mouth daily.    [provider]  benazepril (LOTENSIN) 40 MG tablet Take 1 tablet (40 mg total) by  mouth daily. 07/24/18   Marletta Lor, MD  furosemide (LASIX) 40 MG tablet Take 1 tablet (40 mg total) by mouth daily. 07/24/18   Marletta Lor, MD  ibuprofen (ADVIL,MOTRIN) 200 MG tablet Take 2 tablets (400 mg total) by mouth daily as needed for moderate pain. 01/19/15   Marletta Lor, MD  latanoprost (XALATAN) 0.005 % ophthalmic solution INSTILL 1 DROP INTO BOTH EYES AT BEDTIME 10/29/17   Marletta Lor, MD  LORazepam (ATIVAN) 0.5 MG tablet Take 1 tablet (0.5 mg total) by mouth 2 (two) times daily as needed. for anxiety 07/24/18   Marletta Lor, MD  meclizine (ANTIVERT) 25 MG tablet Take 1 tablet (25 mg total) by mouth 4 (four) times daily as needed. 12/27/14   Marletta Lor, MD  omeprazole (PRILOSEC) 20 MG capsule Take 1 capsule (20 mg total) by mouth daily. 07/24/18   Marletta Lor, MD    Family History No family history on file.  Social History Social History   Tobacco Use  . Smoking status: Former Smoker    Last attempt to quit: 11/12/1978    Years since quitting: 39.7  . Smokeless tobacco: Never Used  Substance Use Topics  . Alcohol use: No  . Drug use: No     Allergies   Codeine   Review of Systems Review of Systems Ten systems reviewed and are negative  for acute change, except as noted in the HPI.    Physical Exam Updated Vital Signs BP (!) 114/55 (BP Location: Left Arm)   Pulse (!) 52   Temp (!) 97.5 F (36.4 C) (Oral)   Resp 13   Ht 5\' 3"  (1.6 m)   Wt 85.8 kg   SpO2 95%   BMI 33.52 kg/m   Physical Exam  Constitutional: She is oriented to person, place, and time. She appears well-developed and well-nourished. No distress.  HENT:  Head: Normocephalic and atraumatic.  Eyes: Conjunctivae are normal. No scleral icterus.  Neck: Normal range of motion.  Cardiovascular: Normal rate, regular rhythm and normal heart sounds. Exam reveals no gallop and no friction rub.  No murmur heard. Pulmonary/Chest: Effort normal and  breath sounds normal. No respiratory distress.  Abdominal: Soft. Bowel sounds are normal. She exhibits no distension and no mass. There is no tenderness. There is no guarding.  Neurological: She is alert and oriented to person, place, and time.  Skin: Skin is warm and dry. Capillary refill takes less than 2 seconds. She is not diaphoretic.  Psychiatric: Her behavior is normal.  Nursing note and vitals reviewed.    ED Treatments / Results  Labs (all labs ordered are listed, but only abnormal results are displayed) Labs Reviewed  CBG MONITORING, ED - Abnormal; Notable for the following components:      Result Value   Glucose-Capillary 118 (*)    All other components within normal limits  CBC  BASIC METABOLIC PANEL  URINALYSIS, ROUTINE W REFLEX MICROSCOPIC  HEPATIC FUNCTION PANEL  LIPASE, BLOOD  I-STAT TROPONIN, ED    EKG EKG Interpretation  Date/Time:  Thursday August 14 2018 13:04:21 EDT Ventricular Rate:  51 PR Interval:    QRS Duration: 99 QT Interval:  420 QTC Calculation: 387 R Axis:   -11 Text Interpretation:  Sinus rhythm Prolonged PR interval Consider anterior infarct similar to previous Confirmed by Theotis Burrow [phone]) on 08/14/2018 1:31:07 PM   Radiology Dg Chest Port 1 View  Result Date: 08/14/2018 CLINICAL DATA:  Syncopal episode, weakness and nausea. EXAM: PORTABLE CHEST 1 VIEW COMPARISON:  Chest radiograph July 23, 2018 FINDINGS: Cardiac silhouette is mildly enlarged and unchanged. Mildly tortuous aorta, calcified aortic arch. Mild chronic interstitial changes and hyperinflation. No pleural effusion or focal consolidation. No pneumothorax. Osteopenia. IMPRESSION: Borderline cardiomegaly.  No acute pulmonary process. Aortic Atherosclerosis (ICD10-I70.0). Electronically Signed   By: Elon Alas M.D.   On: 08/14/2018 13:38    Procedures Procedures (including critical care time)  Medications Ordered in ED Medications - No data to  display   Initial Impression / Assessment and Plan / ED Course  I have reviewed the triage vital signs and the nursing notes.  Pertinent labs & imaging results that were available during my care of the patient were reviewed by me and considered in my medical decision making (see chart for details).     84 year old female with near syncope.  I discussed admission with the patient who adamantly refuses.  She was able to walk here in the emergency department using an ambulatory device with normal oxygen saturations.  Her urine is currently pending.  I have given sign out to PA Hamlin.  Expect discharge.  Patient's EKG shows some mild bradycardia however heart rate has normalized.  She is given a liter of fluid.  She has normal sodium level today.  Glucose is slightly elevated.  She is otherwise hemodynamically stable, alert, oriented, understands the risks  of discharge but has close follow-up with her PCP on 11.  I discussed return precautions including any worsening in condition, fevers, chills, chest pain, dizziness.  Patient expresses her understanding and will return immediately for any of the symptoms.  Final Clinical Impressions(s) / ED Diagnoses   Final diagnoses:  None    ED Discharge Orders    None       Margarita Mail, PA-C 08/14/18 1711    Little, Wenda Overland, MD 08/16/18 [phone]

## 2018-08-14 NOTE — Discharge Instructions (Addendum)
Get help right away if: °You have a severe headache. °You have unusual pain in your chest, abdomen, or back. °You are bleeding from your mouth or rectum, or you have black or tarry stool. °You have a very fast or irregular heartbeat (palpitations). °You faint once or repeatedly. °You have a seizure. °You are confused. °You have trouble walking. °You have severe weakness. °You have vision problems. °

## 2018-08-22 ENCOUNTER — Encounter: Payer: Self-pay | Admitting: *Deleted

## 2018-08-22 ENCOUNTER — Encounter: Payer: Self-pay | Admitting: Family Medicine

## 2018-08-22 ENCOUNTER — Ambulatory Visit (INDEPENDENT_AMBULATORY_CARE_PROVIDER_SITE_OTHER): Payer: Medicare HMO | Admitting: Family Medicine

## 2018-08-22 VITALS — BP 124/80 | HR 88 | Temp 98.0°F | Resp 16 | Ht 63.0 in | Wt 188.0 lb

## 2018-08-22 DIAGNOSIS — I10 Essential (primary) hypertension: Secondary | ICD-10-CM

## 2018-08-22 DIAGNOSIS — F419 Anxiety disorder, unspecified: Secondary | ICD-10-CM | POA: Diagnosis not present

## 2018-08-22 DIAGNOSIS — R2681 Unsteadiness on feet: Secondary | ICD-10-CM | POA: Insufficient documentation

## 2018-08-22 DIAGNOSIS — E876 Hypokalemia: Secondary | ICD-10-CM

## 2018-08-22 DIAGNOSIS — K219 Gastro-esophageal reflux disease without esophagitis: Secondary | ICD-10-CM | POA: Diagnosis not present

## 2018-08-22 DIAGNOSIS — E210 Primary hyperparathyroidism: Secondary | ICD-10-CM | POA: Diagnosis not present

## 2018-08-22 DIAGNOSIS — R6889 Other general symptoms and signs: Secondary | ICD-10-CM | POA: Diagnosis not present

## 2018-08-22 MED ORDER — LORAZEPAM 0.5 MG PO TABS: 0.5000 mg | ORAL_TABLET | Freq: Every day | ORAL | 0 refills | Status: DC | PRN

## 2018-08-22 NOTE — Patient Instructions (Addendum)
A few things to remember from today's visit:   Essential hypertension - Plan: Basic metabolic panel  PRIMARY HYPERPARATHYROIDISM - Plan: VITAMIN D 25 Hydroxy (Vit-D Deficiency, Fractures), Parathyroid hormone, intact (no Ca)  Gastroesophageal reflux disease without esophagitis  Hypokalemia  No changes in your medications today. Continue omeprazole 20 mg daily. We could increase omeprazole to 40 mg if symptoms persist or Zantac 150 mg at bedtime. I will see you back in 6 months, before if needed.  Please be sure medication list is accurate. If a new problem present, please set up appointment sooner than planned today.

## 2018-08-22 NOTE — Progress Notes (Signed)
HPI:   [name] is a 84 y.o. female, who is here today to establish care.  Former PCP: Dr Burnice Logan Last preventive routine visit: 05/2016.  Chronic medical problems: HTN, hyperparathyroidism, OA, GERD, vertigo, and unstable gait among some.  She lives with her son and daughter in [place]. Her husband does in 2014.    Lab Results  Component Value Date   WBC 6.9 08/14/2018   HGB 13.7 08/14/2018   HCT 42.9 08/14/2018   MCV 91.1 08/14/2018   PLT 257 08/14/2018    Concerns today: None.  Recently she was in the ER because near syncope. Sh is trying to drink more fluids and follow low salt diet. She eats "TV dinners" most of the time, but low salt.  She has not had any other episode since ER visit. K+ low at 3.2. Cr 1.12 and e GFR 44 (50). Denies severe/frequent headache, visual changes, chest pain, dyspnea, palpitation, focal weakness, or edema.  C/O bruise left after vein stick in the ER. She is on Aspirin,states that she has a "very sensitive" skin and bruises easy. She denies Hx of CVD.  She denies gum/nose bleed, blood in stool,or gross hematuria,  Vit D deficiency: She is on Vit D 1000 U daily.  Joint pain: knee,ankles,and IP hands bilateral.  Hx of primary hypoparathyroidism. Last Ca++ 10.8 on 08/14/18.  She has not had bone density done, it has been ordered a few times, expired.  Head CT 10/2016 No acute intracranial abnormality. Atrophy ,chronic microvascular disease.  GERD: She is on Omeprazole 20 mg daily,wonders if she needs to increase dose. She still has intermittent episodes of heartburn,mainly at night and exacerbated by certain foods.  No abdominal pain, nausea,vomiting,changes in bowel habits,or melena.  Anxiety: Ativan is on her med list. States that she has not taken med in long time but doe snot want to be removed from list in case she needs it in the future. She took Ativan a couple times when her husband was sick.  Denies  depressed mood or suicidal thoughts.   HTN:  Currently she is on Amlodipine 2.5 mg daily and Benazepril 40 mg daily. Taking medication daily,no side effects reported.  She also takes Furosemide 20 mg 3 times per week for LE edema.    Review of Systems  Constitutional: Negative for activity change, appetite change, fatigue and fever.  HENT: Negative for mouth sores, nosebleeds and trouble swallowing.   Eyes: Negative for redness and visual disturbance.  Respiratory: Negative for cough, shortness of breath and wheezing.   Cardiovascular: Negative for chest pain, palpitations and leg swelling.  Gastrointestinal: Negative for abdominal pain, nausea and vomiting.       Negative for changes in bowel habits.  Genitourinary: Negative for decreased urine volume, dysuria and hematuria.  Musculoskeletal: Positive for arthralgias and gait problem.  Neurological: Negative for syncope, weakness and headaches.  Hematological: Bruises/bleeds easily.  Psychiatric/Behavioral: Negative for confusion. The patient is nervous/anxious.       Current Outpatient Medications on File Prior to Visit  Medication Sig Dispense Refill  . acetaminophen (TYLENOL) 500 MG tablet Take 500 mg by mouth every 6 (six) hours as needed (arthritis pain).    Marland Kitchen amLODipine (NORVASC) 2.5 MG tablet TAKE 1 TABLET EVERY DAY WITH BREAKFAST (Patient taking differently: Take 2.5 mg by mouth daily. TAKE 1 TABLET EVERY DAY WITH BREAKFAST) 90 tablet 3  . aspirin 81 MG chewable tablet Chew 81 mg by mouth daily.    Marland Kitchen  benazepril (LOTENSIN) 40 MG tablet Take 1 tablet (40 mg total) by mouth daily. 90 tablet 3  . furosemide (LASIX) 40 MG tablet Take 1 tablet (40 mg total) by mouth daily. (Patient taking differently: Take 40 mg by mouth. ) 90 tablet 3  . ibuprofen (ADVIL,MOTRIN) 200 MG tablet Take 2 tablets (400 mg total) by mouth daily as needed for moderate pain. 180 tablet 1  . latanoprost (XALATAN) 0.005 % ophthalmic solution INSTILL 1  DROP INTO BOTH EYES AT BEDTIME (Patient taking differently: Place 1 drop into both eyes at bedtime. ) 7.5 mL 3  . meclizine (ANTIVERT) 25 MG tablet Take 1 tablet (25 mg total) by mouth 4 (four) times daily as needed. 90 tablet 3  . omeprazole (PRILOSEC) 20 MG capsule Take 1 capsule (20 mg total) by mouth daily. 90 capsule 3   No current facility-administered medications on file prior to visit.      Past Medical History:  Diagnosis Date  . GERD 10/03/2007  . HYPERTENSION 10/03/2007  . OSTEOPENIA 02/03/2008  . Primary hyperparathyroidism ([place]) 10/03/2007   Allergies  Allergen Reactions  . Codeine Nausea And Vomiting    History reviewed. No pertinent family history.  Social History   Socioeconomic History  . Marital status: Widowed    Spouse name: Not on file  . Number of children: Not on file  . Years of education: Not on file  . Highest education level: Not on file  Occupational History  . Not on file  Social Needs  . Financial resource strain: Not on file  . Food insecurity:    Worry: Not on file    Inability: Not on file  . Transportation needs:    Medical: Not on file    Non-medical: Not on file  Tobacco Use  . Smoking status: Former Smoker    Last attempt to quit: 11/12/1978    Years since quitting: 39.8  . Smokeless tobacco: Never Used  Substance and Sexual Activity  . Alcohol use: No  . Drug use: No  . Sexual activity: Not on file  Lifestyle  . Physical activity:    Days per week: Not on file    Minutes per session: Not on file  . Stress: Not on file  Relationships  . Social connections:    Talks on phone: Not on file    Gets together: Not on file    Attends religious service: Not on file    Active member of club or organization: Not on file    Attends meetings of clubs or organizations: Not on file    Relationship status: Not on file  Other Topics Concern  . Not on file  Social History Narrative  . Not on file    Vitals:   08/22/18 1402  BP:  124/80  Pulse: 88  Resp: 16  Temp: 98 F (36.7 C)  SpO2: 95%    Body mass index is 33.3 kg/m.   Physical Exam  Nursing note and vitals reviewed. Constitutional: She is oriented to person, place, and time. She appears well-developed. No distress.  HENT:  Head: Normocephalic and atraumatic.  Mouth/Throat: Oropharynx is clear and moist and mucous membranes are normal.  Eyes: Pupils are equal, round, and reactive to light. Conjunctivae are normal.  Cardiovascular: Normal rate and regular rhythm.  No murmur heard. Pulses:      Dorsalis pedis pulses are 2+ on the right side, and 2+ on the left side.  Respiratory: Effort normal and breath sounds normal.  No respiratory distress.  GI: Soft. She exhibits no mass. There is no hepatomegaly. There is no tenderness.  Musculoskeletal: She exhibits no edema.  Lymphadenopathy:    She has no cervical adenopathy.  Neurological: She is alert and oriented to person, place, and time. She has normal strength. No cranial nerve deficit. Gait abnormal.  Skin: Skin is warm. No rash noted. No erythema.  Psychiatric: Her mood appears anxious.  Well groomed, good eye contact.      ASSESSMENT AND PLAN:   Ms. Navy was seen today for transfer of care.  Diagnoses and all orders for this visit:  Lab Results  Component Value Date   CREATININE 0.95 (H) 08/22/2018   BUN 19 08/22/2018   NA 132 (L) 08/22/2018   K 5.0 08/22/2018   CL 98 08/22/2018   CO2 24 08/22/2018    Gastroesophageal reflux disease without esophagitis  After discussion of some side effects of PPI's,she prefers not to increase dose. She does not want to add Zantac at bedtime either. GERD precautions also recommended. No changes in Omeprazole 20 mg daily.  Essential hypertension  Adequately controlled. No changes in current management. DASH diet recommended. Eye exam recommended annually. F/U in 6 months, before if needed.  -     Basic metabolic panel  PRIMARY  HYPERPARATHYROIDISM  Further recommendations will be given according to lab results.  -     Parathyroid hormone, intact (no Ca) -     VITAMIN D 25 Hydroxy (Vit-D Deficiency, Fractures)  Hypokalemia  For now recommend K+ rich diet.  -     Basic metabolic panel  Anxiety disorder, unspecified type  Stable. She has not taken Ativan in a while. Side effects discussed.  -     LORazepam (ATIVAN) 0.5 MG tablet; Take 1 tablet (0.5 mg total) by mouth daily as needed for anxiety. for anxiety  Unstable gait  She has not had a fall in 20+ years. Fall precautions discussed.     [name]. Martinique, MD  [facility]. [place] office.

## 2018-08-25 LAB — BASIC METABOLIC PANEL WITH GFR
BUN/Creatinine Ratio: 20 (calc) (ref 6–22)
BUN: 19 mg/dL (ref 7–25)
CO2: 24 mmol/L (ref 20–32)
Calcium: 11.6 mg/dL — ABNORMAL HIGH (ref 8.6–10.4)
Chloride: 98 mmol/L (ref 98–110)
Creat: 0.95 mg/dL — ABNORMAL HIGH (ref 0.60–0.88)
Glucose, Bld: 91 mg/dL (ref 65–99)
Potassium: 5 mmol/L (ref 3.5–5.3)
Sodium: 132 mmol/L — ABNORMAL LOW (ref 135–146)

## 2018-08-25 LAB — VITAMIN D 25 HYDROXY (VIT D DEFICIENCY, FRACTURES): VIT D 25 HYDROXY: 19 ng/mL — AB (ref 30–100)

## 2018-08-25 LAB — PARATHYROID HORMONE, INTACT (NO CA): PTH: 328 pg/mL — ABNORMAL HIGH (ref 14–64)

## 2018-10-13 ENCOUNTER — Ambulatory Visit (INDEPENDENT_AMBULATORY_CARE_PROVIDER_SITE_OTHER): Payer: Medicare HMO | Admitting: Family Medicine

## 2018-10-13 ENCOUNTER — Encounter: Payer: Self-pay | Admitting: Family Medicine

## 2018-10-13 VITALS — BP 122/84 | HR 76 | Temp 97.6°F | Resp 16 | Ht 63.0 in | Wt 183.5 lb

## 2018-10-13 DIAGNOSIS — E210 Primary hyperparathyroidism: Secondary | ICD-10-CM | POA: Diagnosis not present

## 2018-10-13 DIAGNOSIS — R6889 Other general symptoms and signs: Secondary | ICD-10-CM | POA: Diagnosis not present

## 2018-10-13 DIAGNOSIS — E871 Hypo-osmolality and hyponatremia: Secondary | ICD-10-CM | POA: Diagnosis not present

## 2018-10-13 DIAGNOSIS — F419 Anxiety disorder, unspecified: Secondary | ICD-10-CM | POA: Diagnosis not present

## 2018-10-13 DIAGNOSIS — E559 Vitamin D deficiency, unspecified: Secondary | ICD-10-CM

## 2018-10-13 NOTE — Assessment & Plan Note (Signed)
Otherwise stable. Recommend decreasing fluid intake. Instructed about warning signs.

## 2018-10-13 NOTE — Patient Instructions (Addendum)
A few things to remember from today's visit:   PRIMARY HYPERPARATHYROIDISM  Vitamin D deficiency, unspecified  Anxiety disorder, unspecified type  Hyponatremia    Notes recorded by Martinique, Kanchan Gal G, MD on 08/24/2018 at 5:59 PM EDT Ca++ elevated,otherwise stable. Vit D is low, recommend increasing dose of Vit D from 1000 U to 3000 U daily. Na++ mildly low. Decrease fluid intake mildly (about 4 oz less per day). K+ is now in normal range.   Today:  Vit D3 1000 cap take 1 in the morning and 2 at night. Decrease water intake by 4 Oz.

## 2018-10-13 NOTE — Assessment & Plan Note (Signed)
Exacerbated by stress at home. No changes in Ativan. She is not interested in adding medications.

## 2018-10-13 NOTE — Progress Notes (Signed)
HPI:   Ms.Tniyah T Contino is a 84 y.o. female, who is here today to follow on recent OV.    She was last seen on 08/22/2018. Today she would like to discuss lab results in detail, states that she did not receive lab results.   HyperCa++ and elevated PTH, both have been otherwise stable. She tells me that she is not interested in invasive procedures or new medications.  HypoNa++: She is drinking "a lot of" water,afraid of getting dehydrated.  Lab Results  Component Value Date   CREATININE 0.95 (H) 08/22/2018   BUN 19 08/22/2018   NA 132 (L) 08/22/2018   K 5.0 08/22/2018   CL 98 08/22/2018   CO2 24 08/22/2018    Denies unusual fatigue,MS changes,abdominal pain,changes in bowel habits, decreased urine output,worsening depression.  Vit D deficiency: Vit D3 1000 U causes pruritus.  Recently found a new brand that she seems to tolerate better , taking 1000 U daily.  Last 25 OH vit D 19. PTH 328 (319).  Anxiety: She is on Lorazepam 0.5 mg daily prn. She has been under some stress,she lives with son and daughter in [place]. Relation with her daughter in law has been tense,states she told her that if something happens to her, she will not call 911. Daughter in law has some psychiatric problems and is taking medication.  She feels safe at home.   Review of Systems  Constitutional: Negative for activity change, appetite change, fatigue and fever.  HENT: Negative for mouth sores, nosebleeds and trouble swallowing.   Eyes: Negative for redness and visual disturbance.  Respiratory: Negative for cough, shortness of breath and wheezing.   Cardiovascular: Negative for chest pain, palpitations and leg swelling.  Gastrointestinal: Negative for abdominal pain, nausea and vomiting.       Negative for changes in bowel habits.  Genitourinary: Negative for decreased urine volume, dysuria and hematuria.  Musculoskeletal: Positive for arthralgias and gait problem.  Neurological: Negative  for syncope, weakness and headaches.  Psychiatric/Behavioral: Negative for confusion. The patient is nervous/anxious.       Current Outpatient Medications on File Prior to Visit  Medication Sig Dispense Refill  . acetaminophen (TYLENOL) 500 MG tablet Take 500 mg by mouth every 6 (six) hours as needed (arthritis pain).    Marland Kitchen amLODipine (NORVASC) 2.5 MG tablet TAKE 1 TABLET EVERY DAY WITH BREAKFAST (Patient taking differently: Take 2.5 mg by mouth daily. TAKE 1 TABLET EVERY DAY WITH BREAKFAST) 90 tablet 3  . aspirin 81 MG chewable tablet Chew 81 mg by mouth daily.    . benazepril (LOTENSIN) 40 MG tablet Take 1 tablet (40 mg total) by mouth daily. 90 tablet 3  . furosemide (LASIX) 40 MG tablet Take 1 tablet (40 mg total) by mouth daily. (Patient taking differently: Take 40 mg by mouth. ) 90 tablet 3  . ibuprofen (ADVIL,MOTRIN) 200 MG tablet Take 2 tablets (400 mg total) by mouth daily as needed for moderate pain. 180 tablet 1  . latanoprost (XALATAN) 0.005 % ophthalmic solution INSTILL 1 DROP INTO BOTH EYES AT BEDTIME (Patient taking differently: Place 1 drop into both eyes at bedtime. ) 7.5 mL 3  . LORazepam (ATIVAN) 0.5 MG tablet Take 1 tablet (0.5 mg total) by mouth daily as needed for anxiety. for anxiety 30 tablet 0  . meclizine (ANTIVERT) 25 MG tablet Take 1 tablet (25 mg total) by mouth 4 (four) times daily as needed. 90 tablet 3  . omeprazole (PRILOSEC) 20  MG capsule Take 1 capsule (20 mg total) by mouth daily. 90 capsule 3   No current facility-administered medications on file prior to visit.      Past Medical History:  Diagnosis Date  . GERD 10/03/2007  . HYPERTENSION 10/03/2007  . OSTEOPENIA 02/03/2008  . Primary hyperparathyroidism ([place]) 10/03/2007   Allergies  Allergen Reactions  . Codeine Nausea And Vomiting    Social History   Socioeconomic History  . Marital status: Widowed    Spouse name: Not on file  . Number of children: Not on file  . Years of education: Not on  file  . Highest education level: Not on file  Occupational History  . Not on file  Social Needs  . Financial resource strain: Not on file  . Food insecurity:    Worry: Not on file    Inability: Not on file  . Transportation needs:    Medical: Not on file    Non-medical: Not on file  Tobacco Use  . Smoking status: Former Smoker    Last attempt to quit: 11/12/1978    Years since quitting: 39.9  . Smokeless tobacco: Never Used  Substance and Sexual Activity  . Alcohol use: No  . Drug use: No  . Sexual activity: Not on file  Lifestyle  . Physical activity:    Days per week: Not on file    Minutes per session: Not on file  . Stress: Not on file  Relationships  . Social connections:    Talks on phone: Not on file    Gets together: Not on file    Attends religious service: Not on file    Active member of club or organization: Not on file    Attends meetings of clubs or organizations: Not on file    Relationship status: Not on file  Other Topics Concern  . Not on file  Social History Narrative  . Not on file    Vitals:   10/13/18 1401  BP: 122/84  Pulse: 76  Resp: 16  Temp: 97.6 F (36.4 C)  SpO2: 98%   Body mass index is 32.51 kg/m.    Physical Exam  Nursing note and vitals reviewed. Constitutional: She is oriented to person, place, and time. She appears well-developed. No distress.  HENT:  Head: Normocephalic and atraumatic.  Mouth/Throat: Oropharynx is clear and moist and mucous membranes are normal.  Eyes: Pupils are equal, round, and reactive to light. Conjunctivae are normal.  Cardiovascular: Normal rate and regular rhythm.  No murmur heard. Pulses:      Dorsalis pedis pulses are 2+ on the right side, and 2+ on the left side.  Respiratory: Effort normal and breath sounds normal. No respiratory distress.  GI: Soft. She exhibits no mass. There is no tenderness.  Musculoskeletal: She exhibits edema (1+ LE pitting edema ,bilateral).  Lymphadenopathy:     She has no cervical adenopathy.  Neurological: She is alert and oriented to person, place, and time. She has normal strength. No cranial nerve deficit. Gait (assisted by a walker) abnormal.  Skin: Skin is warm. No rash noted. No erythema.  Psychiatric: Her mood appears anxious.  Well groomed, good eye contact.    ASSESSMENT AND PLAN:  Ms. Cannon was seen today for follow-up.  Diagnoses and all orders for this visit:   PRIMARY HYPERPARATHYROIDISM Educated about Dx. She is not interested in invasive testing or surgical procedures. We will continue following.  Hyponatremia Otherwise stable. Recommend decreasing fluid intake. Instructed about warning  signs.  Vitamin D deficiency, unspecified Increase vit D3 from 1000 U to 3000 U daily. We will plan on re-checking vit D next visit.  Anxiety disorder, unspecified Exacerbated by stress at home. No changes in Ativan. She is not interested in adding medications.     [name]. Martinique, MD  [facility]. [place] office.

## 2018-10-13 NOTE — Assessment & Plan Note (Signed)
Increase vit D3 from 1000 U to 3000 U daily. We will plan on re-checking vit D next visit.

## 2018-10-13 NOTE — Assessment & Plan Note (Signed)
Educated about Dx. She is not interested in invasive testing or surgical procedures. We will continue following.

## 2018-10-17 DIAGNOSIS — R6889 Other general symptoms and signs: Secondary | ICD-10-CM | POA: Diagnosis not present

## 2018-10-17 DIAGNOSIS — H401131 Primary open-angle glaucoma, bilateral, mild stage: Secondary | ICD-10-CM | POA: Diagnosis not present

## 2018-10-27 ENCOUNTER — Other Ambulatory Visit: Payer: Self-pay | Admitting: *Deleted

## 2018-10-27 MED ORDER — OMEPRAZOLE 20 MG PO CPDR: 20.0000 mg | DELAYED_RELEASE_CAPSULE | Freq: Every day | ORAL | 3 refills | Status: DC

## 2019-01-12 ENCOUNTER — Encounter: Payer: Self-pay | Admitting: Family Medicine

## 2019-01-12 ENCOUNTER — Ambulatory Visit (INDEPENDENT_AMBULATORY_CARE_PROVIDER_SITE_OTHER): Payer: Medicare HMO | Admitting: Family Medicine

## 2019-01-12 VITALS — BP 140/88 | HR 70 | Temp 98.1°F | Resp 12 | Ht 63.0 in | Wt 186.0 lb

## 2019-01-12 DIAGNOSIS — E210 Primary hyperparathyroidism: Secondary | ICD-10-CM

## 2019-01-12 DIAGNOSIS — F419 Anxiety disorder, unspecified: Secondary | ICD-10-CM

## 2019-01-12 DIAGNOSIS — E875 Hyperkalemia: Secondary | ICD-10-CM | POA: Diagnosis not present

## 2019-01-12 DIAGNOSIS — E871 Hypo-osmolality and hyponatremia: Secondary | ICD-10-CM | POA: Diagnosis not present

## 2019-01-12 DIAGNOSIS — I10 Essential (primary) hypertension: Secondary | ICD-10-CM

## 2019-01-12 DIAGNOSIS — R6889 Other general symptoms and signs: Secondary | ICD-10-CM | POA: Diagnosis not present

## 2019-01-12 DIAGNOSIS — E559 Vitamin D deficiency, unspecified: Secondary | ICD-10-CM

## 2019-01-12 LAB — BASIC METABOLIC PANEL
BUN: 26 mg/dL — ABNORMAL HIGH (ref 6–23)
CALCIUM: 11.8 mg/dL — AB (ref 8.4–10.5)
CO2: 29 mEq/L (ref 19–32)
Chloride: 103 mEq/L (ref 96–112)
Creatinine, Ser: 0.9 mg/dL (ref 0.40–1.20)
GFR: 59.34 mL/min — AB (ref >60.00)
GLUCOSE: 84 mg/dL (ref 70–99)
POTASSIUM: 5.5 meq/L — AB (ref 3.5–5.1)
SODIUM: 136 meq/L (ref 135–145)

## 2019-01-12 LAB — VITAMIN D 25 HYDROXY (VIT D DEFICIENCY, FRACTURES): VITD: 52.1 ng/mL (ref 30.00–100.00)

## 2019-01-12 NOTE — Patient Instructions (Addendum)
A few things to remember from today's visit:   Essential hypertension - Plan: Basic metabolic panel  PRIMARY HYPERPARATHYROIDISM - Plan: Basic metabolic panel, Parathyroid hormone, intact (no Ca)  Anxiety disorder, unspecified type  Hyponatremia - Plan: Basic metabolic panel  Vitamin D deficiency, unspecified - Plan: VITAMIN D 25 Hydroxy (Vit-D Deficiency, Fractures)  No changes in your medications. Continue monitoring blood pressure at home. Low-salt diet recommended.   Please be sure medication list is accurate. If a new problem present, please set up appointment sooner than planned today.

## 2019-01-12 NOTE — Progress Notes (Signed)
HPI:   Annette [name] is a 85 y.o. female, who is here today for chronic disease management.  She was last seen on 10/13/18.  HTN,she is on Benazepril 40 mg daily and Amlodipine 2.5 mg daily.   Hyponatremia, she is trying to limit fluid intake.  Lab Results  Component Value Date   CREATININE 0.95 (H) 08/22/2018   BUN 19 08/22/2018   NA 132 (L) 08/22/2018   K 5.0 08/22/2018   CL 98 08/22/2018   CO2 24 08/22/2018    Negative for headache,chest pain,dyspnea,or worsening edema. She is eating more fruit and less sugar.  BP readings at home usually good, except for one time when it was high,she does not recall reading. She is not longer feeling lightheaded.  Hyperthyroidism and vit D def. She is on Vit D 3000 U daily. She has not noted MS changes,abdominal pain,nausea,vomiting,muscle cramps, or numbness.  Unstable gait,she has not had falls since her last OV.  Generalized OA, she uses her walker in her house.  Anxiety,she takes Lorazepam 0.5 mg as needed. She lives with his son and daughter in law, occasionally there is some family tension. She denies depressed mood or suicidal thoughts.   Review of Systems  Constitutional: Negative for activity change, appetite change and fever.  HENT: Negative for mouth sores, nosebleeds and trouble swallowing.   Eyes: Negative for redness and visual disturbance.  Respiratory: Negative for cough, shortness of breath and wheezing.   Cardiovascular: Negative for chest pain, palpitations and leg swelling.  Gastrointestinal: Negative for abdominal pain, nausea and vomiting.       Negative for changes in bowel habits.  Genitourinary: Negative for decreased urine volume and hematuria.  Musculoskeletal: Positive for arthralgias and gait problem.  Neurological: Negative for syncope, weakness, numbness and headaches.  Psychiatric/Behavioral: Negative for confusion. The patient is nervous/anxious.      Current Outpatient  Medications on File Prior to Visit  Medication Sig Dispense Refill  . acetaminophen (TYLENOL) 500 MG tablet Take 500 mg by mouth every 6 (six) hours as needed (arthritis pain).    Marland Kitchen amLODipine (NORVASC) 2.5 MG tablet TAKE 1 TABLET EVERY DAY WITH BREAKFAST (Patient taking differently: Take 2.5 mg by mouth daily. TAKE 1 TABLET EVERY DAY WITH BREAKFAST) 90 tablet 3  . benazepril (LOTENSIN) 40 MG tablet Take 1 tablet (40 mg total) by mouth daily. 90 tablet 3  . furosemide (LASIX) 40 MG tablet Take 1 tablet (40 mg total) by mouth daily. (Patient taking differently: Take 40 mg by mouth. ) 90 tablet 3  . ibuprofen (ADVIL,MOTRIN) 200 MG tablet Take 2 tablets (400 mg total) by mouth daily as needed for moderate pain. 180 tablet 1  . latanoprost (XALATAN) 0.005 % ophthalmic solution INSTILL 1 DROP INTO BOTH EYES AT BEDTIME (Patient taking differently: Place 1 drop into both eyes at bedtime. ) 7.5 mL 3  . LORazepam (ATIVAN) 0.5 MG tablet Take 1 tablet (0.5 mg total) by mouth daily as needed for anxiety. for anxiety 30 tablet 0  . meclizine (ANTIVERT) 25 MG tablet Take 1 tablet (25 mg total) by mouth 4 (four) times daily as needed. 90 tablet 3  . omeprazole (PRILOSEC) 20 MG capsule Take 1 capsule (20 mg total) by mouth daily. 90 capsule 3   No current facility-administered medications on file prior to visit.      Past Medical History:  Diagnosis Date  . GERD 10/03/2007  . HYPERTENSION 10/03/2007  . OSTEOPENIA 02/03/2008  .  Primary hyperparathyroidism ([place]) 10/03/2007   Allergies  Allergen Reactions  . Codeine Nausea And Vomiting    Social History   Socioeconomic History  . Marital status: Widowed    Spouse name: Not on file  . Number of children: Not on file  . Years of education: Not on file  . Highest education level: Not on file  Occupational History  . Not on file  Social Needs  . Financial resource strain: Not on file  . Food insecurity:    Worry: Not on file    Inability: Not on  file  . Transportation needs:    Medical: Not on file    Non-medical: Not on file  Tobacco Use  . Smoking status: Former Smoker    Last attempt to quit: 11/12/1978    Years since quitting: 40.1  . Smokeless tobacco: Never Used  Substance and Sexual Activity  . Alcohol use: No  . Drug use: No  . Sexual activity: Not on file  Lifestyle  . Physical activity:    Days per week: Not on file    Minutes per session: Not on file  . Stress: Not on file  Relationships  . Social connections:    Talks on phone: Not on file    Gets together: Not on file    Attends religious service: Not on file    Active member of club or organization: Not on file    Attends meetings of clubs or organizations: Not on file    Relationship status: Not on file  Other Topics Concern  . Not on file  Social History Narrative  . Not on file    Vitals:   01/12/19 1347  BP: 140/88  Pulse: 70  Resp: 12  Temp: 98.1 F (36.7 C)   Body mass index is 32.95 kg/m.   Physical Exam  Nursing note and vitals reviewed. Constitutional: She is oriented to person, place, and time. She appears well-developed. No distress.  HENT:  Head: Normocephalic and atraumatic.  Mouth/Throat: Oropharynx is clear and moist and mucous membranes are normal.  Eyes: Conjunctivae are normal.  Neck: No thyroid mass and no thyromegaly present.  Cardiovascular: Normal rate and regular rhythm.  No murmur heard. Respiratory: Effort normal and breath sounds normal. No respiratory distress.  GI: Soft. There is no abdominal tenderness.  Musculoskeletal:        General: Edema (Trace pitting LE edema,bilateral.) present.  Lymphadenopathy:    She has no cervical adenopathy.  Neurological: She is alert and oriented to person, place, and time. She has normal strength. No cranial nerve deficit. Gait abnormal.  Gait assisted by a walker.  Skin: Skin is warm. No rash noted. No erythema.  Psychiatric: Her mood appears anxious.  Well groomed,  poor eye contact.     ASSESSMENT AND PLAN:   Annette [name] was seen today for chronic disease management.  Orders Placed This Encounter  Procedures  . Basic metabolic panel  . Parathyroid hormone, intact (no Ca)  . VITAMIN D 25 Hydroxy (Vit-D Deficiency, Fractures)  . TIQ-NTM   Lab Results  Component Value Date   CREATININE 0.90 01/12/2019   BUN 26 (H) 01/12/2019   NA 136 01/12/2019   K 5.5 (H) 01/12/2019   CL 103 01/12/2019   CO2 29 01/12/2019    1. Essential hypertension Adequately controlled.  No changes in current management. Low salt diet recommended.  - Basic metabolic panel  2. PRIMARY HYPERPARATHYROIDISM We will continue following. Instructed about  warning signs. She is not interested in further work-up.  - Basic metabolic panel - Parathyroid hormone, intact (no Ca)  3. Anxiety disorder, unspecified type Stable. No changes in Lorazepam,side effects discussed.  4. Hyponatremia Continue fluid restriction. Instructed about warning signs.  - Basic metabolic panel  5. Vitamin D deficiency, unspecified No changes in current management, will follow labs done today and will give further recommendations accordingly.  - VITAMIN D 25 Hydroxy (Vit-D Deficiency, Fractures)   Return in about 6 months (around 07/15/2019) for f/u.      G. Martinique, MD  [facility]. [place] office.

## 2019-01-13 ENCOUNTER — Encounter: Payer: Self-pay | Admitting: Family Medicine

## 2019-01-13 LAB — TIQ-NTM

## 2019-01-13 LAB — PARATHYROID HORMONE, INTACT (NO CA): PTH: 127 pg/mL — ABNORMAL HIGH (ref 14–64)

## 2019-01-14 ENCOUNTER — Telehealth: Payer: Self-pay | Admitting: Family Medicine

## 2019-01-14 NOTE — Telephone Encounter (Signed)
Provided  Lab  Results  Per  Dr.  Betty  Martinique.  Patient  States  That she will call back to schedule for repeat lab as soon  As  She can make arrangements for transportation.

## 2019-03-09 IMAGING — DX DG CHEST 1V PORT
1 series · 1 of 1 positions shown · non-contrast
Comparison: Chest radiograph July 23, 2018

CLINICAL DATA: Syncopal episode, weakness and nausea.

EXAM:
PORTABLE CHEST 1 VIEW

[chest ap]
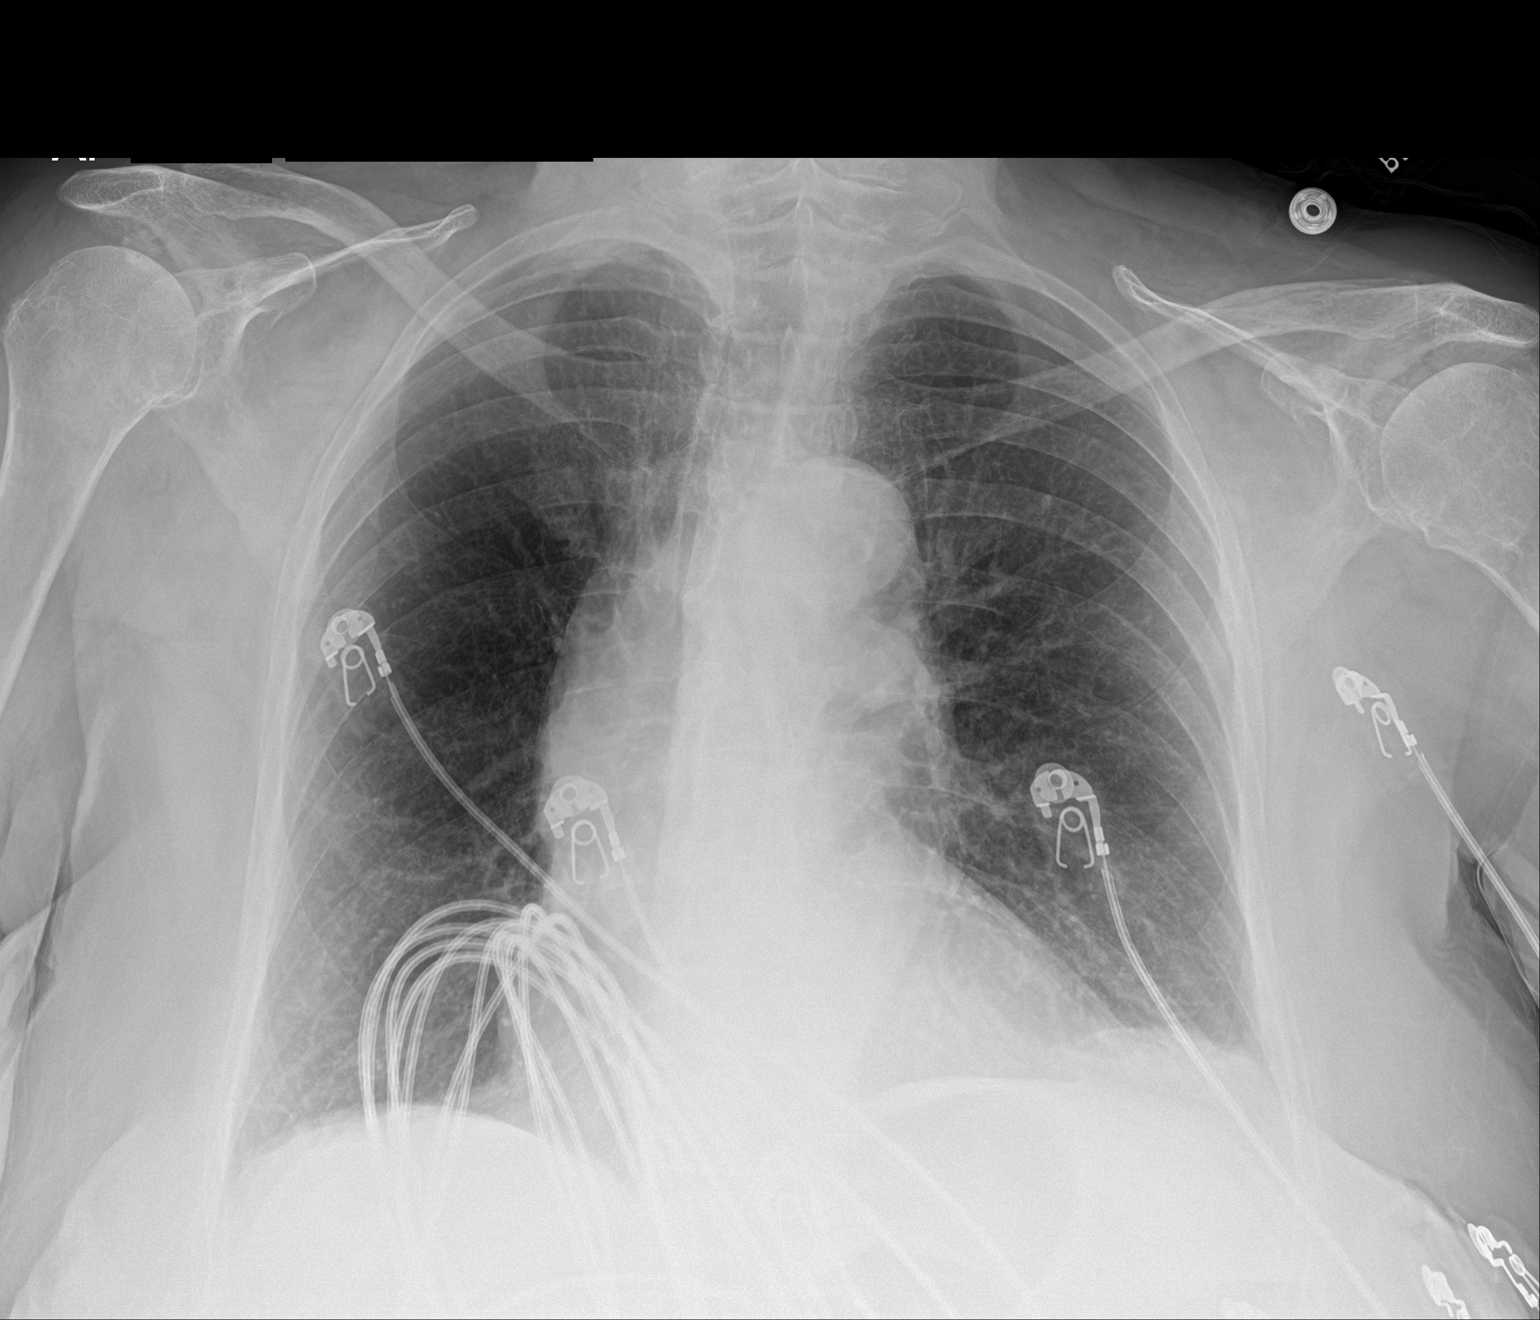

[1 of 1 positions shown; findings below may reference images not displayed]

FINDINGS: Cardiac silhouette is mildly enlarged and unchanged. Mildly tortuous
aorta, calcified aortic arch. Mild chronic interstitial changes and
hyperinflation. No pleural effusion or focal consolidation. No
pneumothorax. Osteopenia.
IMPRESSION: Borderline cardiomegaly.  No acute pulmonary process.

Aortic Atherosclerosis ([id number]-RIS.S).

## 2019-03-26 DIAGNOSIS — R6889 Other general symptoms and signs: Secondary | ICD-10-CM | POA: Diagnosis not present

## 2019-03-26 DIAGNOSIS — H401131 Primary open-angle glaucoma, bilateral, mild stage: Secondary | ICD-10-CM | POA: Diagnosis not present

## 2019-07-06 ENCOUNTER — Other Ambulatory Visit: Payer: Self-pay | Admitting: *Deleted

## 2019-07-06 MED ORDER — FUROSEMIDE 40 MG PO TABS: ORAL_TABLET | ORAL | 3 refills | Status: DC

## 2019-07-10 ENCOUNTER — Telehealth: Payer: Self-pay | Admitting: *Deleted

## 2019-07-10 ENCOUNTER — Other Ambulatory Visit: Payer: Self-pay | Admitting: *Deleted

## 2019-07-10 MED ORDER — BENAZEPRIL HCL 40 MG PO TABS: 40.0000 mg | ORAL_TABLET | Freq: Every day | ORAL | 3 refills | Status: DC

## 2019-07-10 MED ORDER — AMLODIPINE BESYLATE 2.5 MG PO TABS: 2.5000 mg | ORAL_TABLET | Freq: Every day | ORAL | 3 refills | Status: DC

## 2019-07-10 NOTE — Telephone Encounter (Signed)
Returned call to patient and she stated that it was the automated system reminding her of her upcoming appointment. Patient stated that while she was on the phone with someone at the office, she received a message. Patient confirmed that she would be at appointment on 07/15/2019.  Copied from [place] [id number]. Topic: General - Call Back - No Documentation >> Jul 10, 2019 11:42 AM Erick Blinks wrote: Reason for CRM: Pt called back, stating that she missed calls. Please advise >> Jul 10, 2019 12:13 PM Cox, Melburn Hake, CMA wrote: I don't see any notes in chart or CRMs or appt desk that we have called pt. This may have been an appt reminder call as she is scheduled for 07/15/19.  Q - have you tried to contact pt?

## 2019-07-10 NOTE — Telephone Encounter (Signed)
Copied from [place] [id number]. Topic: General - Call Back - No Documentation >> Jul 10, 2019 11:42 AM Erick Blinks wrote: Reason for CRM: Pt called back, stating that she missed calls. Please advise >> Jul 10, 2019 12:13 PM Cox, Melburn Hake, CMA wrote: I don't see any notes in chart or CRMs or appt desk that we have called pt. This may have been an appt reminder call as she is scheduled for 07/15/19.  Q - have you tried to contact pt?

## 2019-07-10 NOTE — Telephone Encounter (Signed)
Returned call to patient and she stated that it was the automated system reminding her of her upcoming appointment. Patient stated that while she was on the phone with someone at the office, she received a message. Patient confirmed that she would be at appointment on 07/15/2019.  Copied from [place] [phone]. Topic: General - Call Back - No Documentation >> Jul 10, 2019 11:42 AM Erick Blinks wrote: Reason for CRM: Pt called back, stating that she missed calls. Please advise >> Jul 10, 2019 12:13 PM Cox, Melburn Hake, CMA wrote: I don't see any notes in chart or CRMs or appt desk that we have called pt. This may have been an appt reminder call as she is scheduled for 07/15/19.  Q - have you tried to contact pt?

## 2019-07-11 ENCOUNTER — Encounter: Payer: Self-pay | Admitting: Family Medicine

## 2019-07-15 ENCOUNTER — Encounter: Payer: Self-pay | Admitting: Family Medicine

## 2019-07-15 ENCOUNTER — Ambulatory Visit (INDEPENDENT_AMBULATORY_CARE_PROVIDER_SITE_OTHER): Payer: Medicare HMO | Admitting: Family Medicine

## 2019-07-15 ENCOUNTER — Other Ambulatory Visit: Payer: Self-pay

## 2019-07-15 VITALS — BP 132/84 | HR 93 | Temp 97.7°F | Resp 12 | Ht 63.0 in | Wt 187.1 lb

## 2019-07-15 DIAGNOSIS — Z0000 Encounter for general adult medical examination without abnormal findings: Secondary | ICD-10-CM | POA: Diagnosis not present

## 2019-07-15 DIAGNOSIS — R6889 Other general symptoms and signs: Secondary | ICD-10-CM | POA: Diagnosis not present

## 2019-07-15 DIAGNOSIS — I10 Essential (primary) hypertension: Secondary | ICD-10-CM | POA: Diagnosis not present

## 2019-07-15 DIAGNOSIS — L570 Actinic keratosis: Secondary | ICD-10-CM

## 2019-07-15 DIAGNOSIS — Z23 Encounter for immunization: Secondary | ICD-10-CM | POA: Diagnosis not present

## 2019-07-15 DIAGNOSIS — E875 Hyperkalemia: Secondary | ICD-10-CM | POA: Diagnosis not present

## 2019-07-15 DIAGNOSIS — F419 Anxiety disorder, unspecified: Secondary | ICD-10-CM

## 2019-07-15 DIAGNOSIS — E559 Vitamin D deficiency, unspecified: Secondary | ICD-10-CM

## 2019-07-15 LAB — BASIC METABOLIC PANEL
BUN: 18 mg/dL (ref 6–23)
CO2: 29 mEq/L (ref 19–32)
Calcium: 12.1 mg/dL — ABNORMAL HIGH (ref 8.4–10.5)
Chloride: 99 mEq/L (ref 96–112)
Creatinine, Ser: 0.95 mg/dL (ref 0.40–1.20)
GFR: 55.68 mL/min — ABNORMAL LOW (ref >60.00)
Glucose, Bld: 87 mg/dL (ref 70–99)
Potassium: 5.1 mEq/L (ref 3.5–5.1)
Sodium: 135 mEq/L (ref 135–145)

## 2019-07-15 LAB — VITAMIN D 25 HYDROXY (VIT D DEFICIENCY, FRACTURES): VITD: 38.92 ng/mL (ref 30.00–100.00)

## 2019-07-15 MED ORDER — MUPIROCIN 2 % EX OINT: 1.0000 "application " | TOPICAL_OINTMENT | Freq: Two times a day (BID) | CUTANEOUS | 0 refills | Status: AC

## 2019-07-15 MED ORDER — MUPIROCIN 2 % EX OINT: 1.0000 "application " | TOPICAL_OINTMENT | Freq: Two times a day (BID) | CUTANEOUS | 0 refills | Status: DC

## 2019-07-15 NOTE — Progress Notes (Signed)
HPI:   Annette [name] is a 85 y.o. female, who is here today for chronic disease management and agrees with having AWV today.  She was last seen on 01/12/19. Last AWV in 2017.  She lives with her son and wife.She does not have a good relation with ehr daughter in law.  She needs assistance for some ADL's and IADL's.She does not drive, she uses Transport planner, it is a Investment banker, corporate provided by [employer] She has 12 rides per year,so sometimes it is difficult for her to come back to repeat labs. No falls in the past year and denies depression symptoms.  She does not cook much,eats frozen meals mainly. She does some yard work and small chores around her house.  Functional Status Survey: Is the patient deaf or have difficulty hearing?: No Does the patient have difficulty seeing, even when wearing glasses/contacts?: No Does the patient have difficulty concentrating, remembering, or making decisions?: No Does the patient have difficulty walking or climbing stairs?: Yes Does the patient have difficulty dressing or bathing?: No Does the patient have difficulty doing errands alone such as visiting a doctor's office or shopping?: Yes  Fall Risk  07/15/2019 08/22/2018 05/23/2016 07/05/2015 01/20/2014  Falls in the past year? 0 No No No No  Number falls in past yr: 0 - - - -  Injury with Fall? 0 - - - -  Risk for fall due to : Orthopedic patient;Impaired balance/gait - - - -    Providers she sees regularly: Eye care provider: Dr Dorita Fray  Depression screen [facility] 2/9 07/15/2019  Decreased Interest 0  Down, Depressed, Hopeless 0  PHQ - 2 Score 0   Mini-Cog - 07/17/19 2042    Normal clock drawing test?  yes    How many words correct?  3       Hearing Screening   125Hz  250Hz  500Hz  1000Hz  2000Hz  3000Hz  4000Hz  6000Hz  8000Hz   Right ear:           Left ear:           Vision Screening Comments: Refused, she sees eye care provider regularly.  HTN: She is on Benazepril 40 mg daily  and Amlodipine 2.5 mg daily. Denies severe/frequent headache, visual changes, chest pain, dyspnea, palpitation,focal weakness, or edema.  K+ elevated ,she did not come back to lab to have it re-check. Lab Results  Component Value Date   CREATININE 0.90 01/12/2019   BUN 26 (H) 01/12/2019   NA 136 01/12/2019   K 5.5 (H) 01/12/2019   CL 103 01/12/2019   CO2 29 01/12/2019   She decrease dose of vitamin D supplementation, from 3000 units (recommended) to 2000 units.  Anxiety: Currently she is on Lorazepam 0.5 mg daily as needed, she take med very seldom.Last refill 05/2018, she wants to keep it on her med list. Denies depressed mood.  No side effects reported.  Vit D deficiency, recommended vit D 3000 U,after normal 25 OH vit D she decreased dose back to 2000 U. Primary hyperparathyroidism, she has not noted MS changes,cramps,abdominal pain,or changes in bowel habits.  Today she is concerned about lesion on left calf. She has had lesion for many years but it seems like she scratched area while asleep,had some bleeding. She thinks she may have an "infection",has been applied OTC neosporin and keeping lesion covered with band aid. Denies pain or pruritus.   Review of Systems  Constitutional: Negative for activity change, appetite change and fever.  HENT: Negative  for mouth sores, nosebleeds and trouble swallowing.   Eyes: Negative for pain and redness.  Respiratory: Negative for cough and wheezing.   Gastrointestinal: Negative for abdominal pain, nausea and vomiting.       Negative for changes in bowel habits.  Genitourinary: Negative for decreased urine volume, dysuria and hematuria.  Musculoskeletal: Positive for arthralgias and gait problem.  Skin: Negative for rash and wound.  Neurological: Negative for syncope and facial asymmetry.  Psychiatric/Behavioral: The patient is nervous/anxious.    Rest see pertinent positives and negatives per HPI.   Current Outpatient Medications  on File Prior to Visit  Medication Sig Dispense Refill  . acetaminophen (TYLENOL) 500 MG tablet Take 500 mg by mouth every 6 (six) hours as needed (arthritis pain).    Marland Kitchen amLODipine (NORVASC) 2.5 MG tablet Take 1 tablet (2.5 mg total) by mouth daily. TAKE 1 TABLET EVERY DAY WITH BREAKFAST 90 tablet 3  . benazepril (LOTENSIN) 40 MG tablet Take 1 tablet (40 mg total) by mouth daily. 90 tablet 3  . furosemide (LASIX) 40 MG tablet Take 1 tablet 3 times weekly 30 tablet 3  . ibuprofen (ADVIL,MOTRIN) 200 MG tablet Take 2 tablets (400 mg total) by mouth daily as needed for moderate pain. 180 tablet 1  . latanoprost (XALATAN) 0.005 % ophthalmic solution INSTILL 1 DROP INTO BOTH EYES AT BEDTIME (Patient taking differently: Place 1 drop into both eyes at bedtime. ) 7.5 mL 3  . LORazepam (ATIVAN) 0.5 MG tablet Take 1 tablet (0.5 mg total) by mouth daily as needed for anxiety. for anxiety 30 tablet 0  . meclizine (ANTIVERT) 25 MG tablet Take 1 tablet (25 mg total) by mouth 4 (four) times daily as needed. 90 tablet 3  . omeprazole (PRILOSEC) 20 MG capsule Take 1 capsule (20 mg total) by mouth daily. 90 capsule 3  . VITAMIN D PO Take 2 mg by mouth daily.     No current facility-administered medications on file prior to visit.      Past Medical History:  Diagnosis Date  . GERD 10/03/2007  . HYPERTENSION 10/03/2007  . OSTEOPENIA 02/03/2008  . Primary hyperparathyroidism ([place]) 10/03/2007   Allergies  Allergen Reactions  . Codeine Nausea And Vomiting    Social History   Socioeconomic History  . Marital status: Widowed    Spouse name: Not on file  . Number of children: Not on file  . Years of education: Not on file  . Highest education level: Not on file  Occupational History  . Not on file  Social Needs  . Financial resource strain: Not on file  . Food insecurity    Worry: Not on file    Inability: Not on file  . Transportation needs    Medical: Not on file    Non-medical: Not on file   Tobacco Use  . Smoking status: Former Smoker    Quit date: 11/12/1978    Years since quitting: 40.7  . Smokeless tobacco: Never Used  Substance and Sexual Activity  . Alcohol use: No  . Drug use: No  . Sexual activity: Not on file  Lifestyle  . Physical activity    Days per week: Not on file    Minutes per session: Not on file  . Stress: Not on file  Relationships  . Social Herbalist on phone: Not on file    Gets together: Not on file    Attends religious service: Not on file    Active member  of club or organization: Not on file    Attends meetings of clubs or organizations: Not on file    Relationship status: Not on file  Other Topics Concern  . Not on file  Social History Narrative  . Not on file    Vitals:   07/15/19 1111  BP: 132/84  Pulse: 93  Temp: 97.7 F (36.5 C)  SpO2: 96%   Body mass index is 33.15 kg/m.    Physical Exam  Nursing note and vitals reviewed. Constitutional: She is oriented to person, place, and time. She appears well-developed. No distress.  HENT:  Head: Normocephalic and atraumatic.  Mouth/Throat: Oropharynx is clear and moist and mucous membranes are normal.  Eyes: Pupils are equal, round, and reactive to light. Conjunctivae are normal.  Cardiovascular: Normal rate and regular rhythm.  No murmur heard. Pulses:      Posterior tibial pulses are 2+ on the right side and 2+ on the left side.  Respiratory: Effort normal and breath sounds normal. No respiratory distress.  GI: Soft. There is no abdominal tenderness.  Musculoskeletal:        General: No edema.  Lymphadenopathy:    She has no cervical adenopathy.  Neurological: She is alert and oriented to person, place, and time. She has normal strength.  Unstable gait, assisted by a walker.  Skin: Skin is warm. No rash noted. No erythema.     2-3 cm,keratoratotic hypopigmented lesion. No tender,no local heat,or fluctuant area. See picture.  Psychiatric: Her mood appears  anxious.  Well groomed, good eye contact.        ASSESSMENT AND PLAN:  Annette [name] was seen today for follow-up and annual exam.  Diagnoses and all orders for this visit:  Lab Results  Component Value Date   CREATININE 0.95 07/15/2019   BUN 18 07/15/2019   NA 135 07/15/2019   K 5.1 07/15/2019   CL 99 07/15/2019   CO2 29 07/15/2019    Medicare annual wellness visit, subsequent We discussed the importance of staying active, physically and mentally, as well as the benefits of a healthy/balnace diet. Low impact exercise that involve stretching and strengthing are ideal. Vaccines up to date. We discussed preventive screening for the next 5-10 years, summery of recommendations given in AVS. Fall prevention. Annual influenza vaccine. Advance directives and end of life discussed, she has POA and living will.   Vitamin D deficiency, unspecified Continue Vit D 2000 U. Will adjust dose according to 25 OH vit D result.  -     VITAMIN D 25 Hydroxy (Vit-D Deficiency, Fractures)  Hyperkalemia Instructed about warning signs. No changes in Benazepril for now.  -     Basic metabolic panel  Anxiety disorder, unspecified type Stable. She does not feel she needs a refill for Lorazepam at this time. Sid effects of medication discussed.  Essential hypertension BP adequately controlled. No changes in Benazepril 40 mg and Amlodipine 2.5 mg daily. Low salt diet recommended.  -     Basic metabolic panel  Keratotic lesion She is concerned about possibility of cancer. ? SCC, I think lesion needs to be evaluated by dermatologist,Bx needed. She is concerned about "infection", explained that examination doe snot suggest infectious process.She feels better if she can have abx,so topical mupirocin recommended. Keep lesion clean with soap and water and uncovered.  -     Ambulatory referral to Dermatology  Need for influenza vaccination -     Flu Vaccine QUAD High Dose(Fluad)  Other  orders -  mupirocin ointment (BACTROBAN) 2 %; Apply 1 application topically 2 (two) times daily for 10 days.    No problem-specific Assessment & Plan notes found for this encounter.      Return in about 6 months (around 01/12/2020).    -Ms. Annette [name] was advised to return sooner than planned today if new concerns arise.       [name]. Martinique, MD  [facility]. [place] office.

## 2019-07-15 NOTE — Patient Instructions (Addendum)
  Annette [name] , Thank you for taking time to come for your Medicare Wellness Visit. I appreciate your ongoing commitment to your health goals. Please review the following plan we discussed and let me know if I can assist you in the future.   These are the goals we discussed: Goals   None     This is a list of the screening recommended for you and due dates:  Health Maintenance  Topic Date Due  . Flu Shot  06/13/2019  . DEXA scan (bone density measurement)  08/25/2019*  . Pneumonia vaccines  Completed  . Tetanus Vaccine  Discontinued  *Topic was postponed. The date shown is not the original due date.   A few things to remember from today's visit:   Vitamin D deficiency, unspecified - Plan: VITAMIN D 25 Hydroxy (Vit-D Deficiency, Fractures)  Hyperkalemia - Plan: Basic metabolic panel  Anxiety disorder, unspecified type  Essential hypertension - Plan: Basic metabolic panel  Keratotic lesion - Plan: Ambulatory referral to Dermatology  Medicare annual wellness visit, subsequent   A few tips:  -As we age balance is not as good as it was, so there is a higher risks for falls. Please remove small rugs and furniture that is "in your way" and could increase the risk of falls. Stretching exercises may help with fall prevention: Yoga and Tai Chi are some examples. Low impact exercise is better, so you are not very achy the next day.  -Sun screen and avoidance of direct sun light recommended. Caution with dehydration, if working outdoors be sure to drink enough fluids.  - Some medications are not safe as we age, increases the risk of side effects and can potentially interact with other medication you are also taken;  including some of over the counter medications. Be sure to let me know when you start a new medication even if it is a dietary/vitamin supplement.   -Healthy diet low in red meet/animal fat and sugar + regular physical activity is recommended.       Screening schedule  for the next 5-10 years:  Glaucoma treatment with eye doctor Flu vaccine annually. Fall prevention   Advance directives:  Please see a lawyer and/or go to this website to help you with advanced directives and designating a health care power of attorney so that your wishes will be followed should you become too ill to make your own medical decisions.  [email or web address]

## 2019-07-17 ENCOUNTER — Encounter: Payer: Self-pay | Admitting: Family Medicine

## 2019-10-01 DIAGNOSIS — R6889 Other general symptoms and signs: Secondary | ICD-10-CM | POA: Diagnosis not present

## 2019-10-01 DIAGNOSIS — H401131 Primary open-angle glaucoma, bilateral, mild stage: Secondary | ICD-10-CM | POA: Diagnosis not present

## 2019-11-10 ENCOUNTER — Ambulatory Visit: Payer: Self-pay | Admitting: *Deleted

## 2019-11-10 NOTE — Telephone Encounter (Signed)
Message Routed to PCP CMA 

## 2019-11-10 NOTE — Telephone Encounter (Signed)
Patient is having elevation in her BP reading. She thinks she has been getting too much salt. EMS came out yesterday- 189/110 and 166/99, 176/97. Patient didn't feel quite right- she was anxious.Today her BP 165/84- this afternoon. Patient is unable to get another reading- her son got that reading before he went to work. Patient relies on other for transportation and she does not think she can make it into the office until Monday. Told patient I would send note- Dr Martinique may want to talk to her on the phone before then. Reason for Disposition . Systolic BP  >= 0000000 OR Diastolic >= [id number]  Answer Assessment - Initial Assessment Questions 1. BLOOD PRESSURE: "What is the blood pressure?" "Did you take at least two measurements 5 minutes apart?"     165/84 2. ONSET: "When did you take your blood pressure?"     1:30 today 3. HOW: "How did you obtain the blood pressure?" (e.g., visiting nurse, automatic home BP monitor)     automatic cuff- wrist 4. HISTORY: "Do you have a history of high blood pressure?"     yes 5. MEDICATIONS: "Are you taking any medications for blood pressure?" "Have you missed any doses recently?"     Yes- no missed medication 6. OTHER SYMPTOMS: "Do you have any symptoms?" (e.g., headache, chest pain, blurred vision, difficulty breathing, weakness)     no 7. PREGNANCY: "Is there any chance you are pregnant?" "When was your last menstrual period?"     n/a  Protocols used: HIGH BLOOD PRESSURE-A-AH

## 2019-11-10 NOTE — Telephone Encounter (Signed)
Pt would be unable to come into the office until Monday, do you want to do a telephone call tomorrow afternoon?

## 2019-11-11 ENCOUNTER — Encounter: Payer: Self-pay | Admitting: Family Medicine

## 2019-11-11 ENCOUNTER — Telehealth (INDEPENDENT_AMBULATORY_CARE_PROVIDER_SITE_OTHER): Payer: Medicare HMO | Admitting: Family Medicine

## 2019-11-11 VITALS — BP 137/71 | HR 82 | Ht 63.0 in

## 2019-11-11 DIAGNOSIS — K5900 Constipation, unspecified: Secondary | ICD-10-CM

## 2019-11-11 DIAGNOSIS — I10 Essential (primary) hypertension: Secondary | ICD-10-CM

## 2019-11-11 DIAGNOSIS — M159 Polyosteoarthritis, unspecified: Secondary | ICD-10-CM | POA: Diagnosis not present

## 2019-11-11 DIAGNOSIS — F419 Anxiety disorder, unspecified: Secondary | ICD-10-CM | POA: Diagnosis not present

## 2019-11-11 MED ORDER — AMLODIPINE BESYLATE 2.5 MG PO TABS: 5.0000 mg | ORAL_TABLET | Freq: Every day | ORAL | 3 refills | Status: DC

## 2019-11-11 NOTE — Progress Notes (Signed)
Virtual Visit via Telephone Note  I connected with Annette [name] on 11/11/19 at 10:00 AM EST by telephone and verified that I am speaking with the correct person using two identifiers.   I discussed the limitations, risks, security and privacy concerns of performing an evaluation and management service by telephone and the availability of in person appointments. I also discussed with the patient that there may be a patient responsible charge related to this service. The patient expressed understanding and agreed to proceed.  Location patient: home Location provider: work office Participants present for the call: patient, provider Patient did not have a visit in the prior 7 days to address this/these issue(s).   History of Present Illness: Annette [name] is a 85 yo female with Hx of hyperparathyroidism,HTN,anxiety,and hyponatremia who is concerned about elevated BP since yesterday.  Her son is with her during visit.  BP 180/100, EMS was at her house and BP was 174/100. She has a wrist BP monitor.  Denies severe/frequent headache, visual changes, chest pain, dyspnea, palpitation, focal weakness, or edema. Currently she is on benazepril 40 mg daily and amlodipine 2.5 mg daily. She has some wheezing, which she attributes to stress.  She has taken her Lorazepam 0.5 mg  more often, 2 tabs since yesterday and she feels better after taking it.   Denies associated CP or SOB. She is eating frozen dinners.  Constipation:Last bowel movement about a week ago. She usually takes magnesium citrate but has not done so since her blood pressure has been elevated. She denies abdominal pain, nausea, vomiting, or urinary symptoms. She has the urge of defecation. She is passing gas.  Her son would like to have an aid a few hours per week to help with ADL's. Unstable gait due to generalized OA. No recent falls. She uses a walker.  Observations/Objective: Patient sounds cheerful and well on the phone. I  do not appreciate any SOB. Speech and thought processing are grossly intact.Anxious, oriented x 3. Patient reported vitals:BP 137/71   Pulse 82   Ht 5\' 3"  (1.6 m)   BMI 33.15 kg/m    Assessment and Plan:  1. Essential hypertension Today BP has improved but based on other readings at home problem is poorly controlled. Possible complications of elevated BP discussed. Amlodipine dose increased from 2.5 mg to 5 mg. Continue Benazepril 40 mg daily. Low salt diet also recommended. Instructed about warning signs.  Keep appt next week.  - amLODipine (NORVASC) 2.5 MG tablet; Take 2 tablets (5 mg total) by mouth daily. TAKE 1 TABLET EVERY DAY WITH BREAKFAST  Dispense: 90 tablet; Refill: 3 - Ambulatory referral to Home Health  2. Constipation, unspecified constipation type Miralax daily until she has a good bowel movement. Adequate fiber and fluid intake. Benefiber 1 tsp bid. If problem is persistent Bisacodyl 5 mg daily ca be added. Instructed about warning signs.   3. Generalized osteoarthritis of multiple sites Fall precautions. HH evaluation will be arranged.  - Ambulatory referral to [place]  4. Anxiety disorder, unspecified type Some side effects of Lorazepam discussed. No changes in current management.    Follow Up Instructions:  Return in about 1 week (around 11/18/2019), or Keep next appt.    I did not refer this patient for an OV in the next 24 hours for this/these issue(s).  I discussed the assessment and treatment plan with the patient. She was provided an opportunity to ask questions and all were answered. She agreed with the plan and demonstrated  an understanding of the instructions.   The patient was advised to call back or seek an in-person evaluation if the symptoms worsen or if the condition fails to improve as anticipated.  I provided 21-22 minutes of non-face-to-face time during this encounter.   Benaiah Behan Martinique, MD

## 2019-11-11 NOTE — Telephone Encounter (Signed)
I spoke with pt. Appointment scheduled for 10am this morning so pt's son can take her blood pressure before he goes into work.

## 2019-11-11 NOTE — Telephone Encounter (Signed)
Let' do a telephone/video call today, although it will be ideal to see her here in the office. Thanks, BJ

## 2019-11-12 ENCOUNTER — Inpatient Hospital Stay (HOSPITAL_COMMUNITY)
Admission: EM | Admit: 2019-11-12 | Discharge: 2019-11-16 | DRG: 175 | Disposition: A | Discharge status: Home Health | Payer: Medicare HMO | Attending: Osteopathic Medicine | Admitting: Osteopathic Medicine

## 2019-11-12 ENCOUNTER — Encounter (HOSPITAL_COMMUNITY): Payer: Self-pay | Admitting: Emergency Medicine

## 2019-11-12 ENCOUNTER — Emergency Department (HOSPITAL_COMMUNITY): Payer: Medicare HMO

## 2019-11-12 DIAGNOSIS — K219 Gastro-esophageal reflux disease without esophagitis: Secondary | ICD-10-CM | POA: Diagnosis present

## 2019-11-12 DIAGNOSIS — Z791 Long term (current) use of non-steroidal anti-inflammatories (NSAID): Secondary | ICD-10-CM | POA: Diagnosis not present

## 2019-11-12 DIAGNOSIS — I509 Heart failure, unspecified: Secondary | ICD-10-CM | POA: Diagnosis present

## 2019-11-12 DIAGNOSIS — J9691 Respiratory failure, unspecified with hypoxia: Secondary | ICD-10-CM | POA: Diagnosis not present

## 2019-11-12 DIAGNOSIS — Z20822 Contact with and (suspected) exposure to covid-19: Secondary | ICD-10-CM | POA: Diagnosis present

## 2019-11-12 DIAGNOSIS — Z87891 Personal history of nicotine dependence: Secondary | ICD-10-CM

## 2019-11-12 DIAGNOSIS — H409 Unspecified glaucoma: Secondary | ICD-10-CM | POA: Diagnosis present

## 2019-11-12 DIAGNOSIS — I2609 Other pulmonary embolism with acute cor pulmonale: Principal | ICD-10-CM | POA: Diagnosis present

## 2019-11-12 DIAGNOSIS — E871 Hypo-osmolality and hyponatremia: Secondary | ICD-10-CM | POA: Diagnosis not present

## 2019-11-12 DIAGNOSIS — R7989 Other specified abnormal findings of blood chemistry: Secondary | ICD-10-CM

## 2019-11-12 DIAGNOSIS — M8580 Other specified disorders of bone density and structure, unspecified site: Secondary | ICD-10-CM | POA: Diagnosis present

## 2019-11-12 DIAGNOSIS — Z9181 History of falling: Secondary | ICD-10-CM

## 2019-11-12 DIAGNOSIS — E210 Primary hyperparathyroidism: Secondary | ICD-10-CM | POA: Diagnosis not present

## 2019-11-12 DIAGNOSIS — R918 Other nonspecific abnormal finding of lung field: Secondary | ICD-10-CM | POA: Diagnosis present

## 2019-11-12 DIAGNOSIS — I10 Essential (primary) hypertension: Secondary | ICD-10-CM | POA: Diagnosis not present

## 2019-11-12 DIAGNOSIS — Z20828 Contact with and (suspected) exposure to other viral communicable diseases: Secondary | ICD-10-CM | POA: Diagnosis not present

## 2019-11-12 DIAGNOSIS — I2699 Other pulmonary embolism without acute cor pulmonale: Secondary | ICD-10-CM | POA: Diagnosis not present

## 2019-11-12 DIAGNOSIS — R0902 Hypoxemia: Secondary | ICD-10-CM

## 2019-11-12 DIAGNOSIS — I361 Nonrheumatic tricuspid (valve) insufficiency: Secondary | ICD-10-CM | POA: Diagnosis not present

## 2019-11-12 DIAGNOSIS — R0602 Shortness of breath: Secondary | ICD-10-CM | POA: Diagnosis not present

## 2019-11-12 DIAGNOSIS — F419 Anxiety disorder, unspecified: Secondary | ICD-10-CM | POA: Diagnosis not present

## 2019-11-12 DIAGNOSIS — Z79899 Other long term (current) drug therapy: Secondary | ICD-10-CM

## 2019-11-12 DIAGNOSIS — I340 Nonrheumatic mitral (valve) insufficiency: Secondary | ICD-10-CM | POA: Diagnosis not present

## 2019-11-12 DIAGNOSIS — R457 State of emotional shock and stress, unspecified: Secondary | ICD-10-CM | POA: Diagnosis not present

## 2019-11-12 DIAGNOSIS — E860 Dehydration: Secondary | ICD-10-CM | POA: Diagnosis not present

## 2019-11-12 DIAGNOSIS — I110 Hypertensive heart disease with heart failure: Secondary | ICD-10-CM | POA: Diagnosis present

## 2019-11-12 LAB — CBC WITH DIFFERENTIAL/PLATELET
Abs Immature Granulocytes: 0.01 10*3/uL (ref 0.00–0.07)
Basophils Absolute: 0 10*3/uL (ref 0.0–0.1)
Basophils Relative: 0 %
Eosinophils Absolute: 0 10*3/uL (ref 0.0–0.5)
Eosinophils Relative: 1 %
HCT: 42.7 % (ref 36.0–46.0)
Hemoglobin: 14.3 g/dL (ref 12.0–15.0)
Immature Granulocytes: 0 %
Lymphocytes Relative: 10 %
Lymphs Abs: 0.7 10*3/uL (ref 0.7–4.0)
MCH: 29.3 pg (ref 26.0–34.0)
MCHC: 33.5 g/dL (ref 30.0–36.0)
MCV: 87.5 fL (ref 80.0–100.0)
Monocytes Absolute: 1.1 10*3/uL — ABNORMAL HIGH (ref 0.1–1.0)
Monocytes Relative: 17 %
Neutro Abs: 4.8 10*3/uL (ref 1.7–7.7)
Neutrophils Relative %: 72 %
Platelets: 234 10*3/uL (ref 150–400)
RBC: 4.88 MIL/uL (ref 3.87–5.11)
RDW: 13.2 % (ref 11.5–15.5)
WBC: 6.6 10*3/uL (ref 4.0–10.5)
nRBC: 0 % (ref 0.0–0.2)

## 2019-11-12 LAB — COMPREHENSIVE METABOLIC PANEL
ALT: 37 U/L (ref 0–44)
AST: 37 U/L (ref 15–41)
Albumin: 3.9 g/dL (ref 3.5–5.0)
Alkaline Phosphatase: 77 U/L (ref 38–126)
Anion gap: 10 (ref 5–15)
BUN: 16 mg/dL (ref 8–23)
CO2: 25 mmol/L (ref 22–32)
Calcium: 10.9 mg/dL — ABNORMAL HIGH (ref 8.9–10.3)
Chloride: 88 mmol/L — ABNORMAL LOW (ref 98–111)
Creatinine, Ser: 0.95 mg/dL (ref 0.44–1.00)
GFR calc Af Amer: >60 mL/min (ref >60)
GFR calc non Af Amer: 54 mL/min — ABNORMAL LOW (ref >60)
Glucose, Bld: 107 mg/dL — ABNORMAL HIGH (ref 70–99)
Potassium: 4.2 mmol/L (ref 3.5–5.1)
Sodium: 123 mmol/L — ABNORMAL LOW (ref 135–145)
Total Bilirubin: 0.9 mg/dL (ref 0.3–1.2)
Total Protein: 6.3 g/dL — ABNORMAL LOW (ref 6.5–8.1)

## 2019-11-12 LAB — POC SARS CORONAVIRUS 2 AG -  ED: SARS Coronavirus 2 Ag: NEGATIVE

## 2019-11-12 LAB — BRAIN NATRIURETIC PEPTIDE: B Natriuretic Peptide: 489.6 pg/mL — ABNORMAL HIGH (ref 0.0–100.0)

## 2019-11-12 LAB — LACTIC ACID, PLASMA: Lactic Acid, Venous: 2 mmol/L (ref 0.5–1.9)

## 2019-11-12 LAB — SARS CORONAVIRUS 2 (TAT 6-24 HRS): SARS Coronavirus 2: NEGATIVE

## 2019-11-12 LAB — D-DIMER, QUANTITATIVE: D-Dimer, Quant: 2.46 ug/mL-FEU — ABNORMAL HIGH (ref 0.00–0.50)

## 2019-11-12 LAB — MAGNESIUM: Magnesium: 1.8 mg/dL (ref 1.7–2.4)

## 2019-11-12 LAB — TROPONIN I (HIGH SENSITIVITY)
Troponin I (High Sensitivity): 25 ng/L — ABNORMAL HIGH (ref <18)
Troponin I (High Sensitivity): 28 ng/L — ABNORMAL HIGH (ref <18)

## 2019-11-12 MED ORDER — IOHEXOL 350 MG/ML SOLN
75.0000 mL | Freq: Once | INTRAVENOUS | Status: AC | PRN
  Administered 2019-11-12: 75 mL via INTRAVENOUS

## 2019-11-12 MED ORDER — LATANOPROST 0.005 % OP SOLN
1.0000 [drp] | Freq: Every day | OPHTHALMIC | Status: DC
  Administered 2019-11-13 – 2019-11-15 (×3): 1 [drp] via OPHTHALMIC
  Filled 2019-11-12: qty 2.5

## 2019-11-12 MED ORDER — IPRATROPIUM BROMIDE HFA 17 MCG/ACT IN AERS
2.0000 | INHALATION_SPRAY | Freq: Once | RESPIRATORY_TRACT | Status: AC
  Administered 2019-11-12: 2 via RESPIRATORY_TRACT
  Filled 2019-11-12: qty 12.9

## 2019-11-12 MED ORDER — HEPARIN BOLUS VIA INFUSION
4000.0000 [IU] | Freq: Once | INTRAVENOUS | Status: AC
  Administered 2019-11-13: 4000 [IU] via INTRAVENOUS
  Filled 2019-11-12: qty 4000

## 2019-11-12 MED ORDER — ALBUTEROL SULFATE HFA 108 (90 BASE) MCG/ACT IN AERS
2.0000 | INHALATION_SPRAY | Freq: Once | RESPIRATORY_TRACT | Status: AC
  Administered 2019-11-12: 16:00:00 2 via RESPIRATORY_TRACT
  Filled 2019-11-12: qty 6.7

## 2019-11-12 MED ORDER — HEPARIN (PORCINE) 25000 UT/250ML-% IV SOLN
1150.0000 [IU]/h | INTRAVENOUS | Status: DC
  Administered 2019-11-13 – 2019-11-14 (×3): 1100 [IU]/h via INTRAVENOUS
  Administered 2019-11-15: 17:00:00 1150 [IU]/h via INTRAVENOUS
  Filled 2019-11-12 (×4): qty 250

## 2019-11-12 MED ORDER — METHYLPREDNISOLONE SODIUM SUCC 125 MG IJ SOLR
125.0000 mg | Freq: Once | INTRAMUSCULAR | Status: AC
  Administered 2019-11-12: 18:00:00 125 mg via INTRAVENOUS
  Filled 2019-11-12: qty 2

## 2019-11-12 NOTE — ED Triage Notes (Signed)
Pt from home, reports 1 month of increased SOB, wheezing noted by EMS, satting mid 90's on RA, pt very anxious. A&O x4. Afebrile and no known sick contacts. Denies CP or other complaints.

## 2019-11-12 NOTE — ED Provider Notes (Signed)
  Provider Note MRN:  [id number]  Arrival date & time: 11/12/19    ED Course and Medical Decision Making  Assumed care from Dr. Sherry Ruffing at shift change.  CT scans reveal bilateral pulmonary embolism with mild right heart strain.  Patient continues to be hemodynamically stable.  She is on 6 L nasal cannula.  Intensivist consulted given the heart strain, will follow in consultation.  Admitted to hospital service for further care.  .Critical Care Performed by: Maudie Flakes, MD Authorized by: Maudie Flakes, MD   Critical care provider statement:    Critical care time (minutes):  45   Critical care was necessary to treat or prevent imminent or life-threatening deterioration of the following conditions: Acute pulmonary embolism.   Critical care was time spent personally by me on the following activities:  Discussions with consultants, evaluation of patient's response to treatment, examination of patient, ordering and performing treatments and interventions, ordering and review of laboratory studies, ordering and review of radiographic studies, pulse oximetry, re-evaluation of patient's condition, obtaining history from patient or surrogate and review of old charts   I assumed direction of critical care for this patient from another provider in my specialty: yes      Final Clinical Impressions(s) / ED Diagnoses     ICD-10-CM   1. Hypoxia  R09.02   2. Elevated brain natriuretic peptide (BNP) level  R79.89   3. Other acute pulmonary embolism with acute cor pulmonale ([place])  I26.09     ED Discharge Orders    None      Discharge Instructions   None     Barth Kirks. [place] Small, [place] [email or web address]    Maudie Flakes, MD 11/12/19 [phone]

## 2019-11-12 NOTE — ED Provider Notes (Signed)
[facility] EMERGENCY DEPARTMENT Provider Note   CSN: [id number] Arrival date & time: 11/12/19  1527     History No chief complaint on file.   ANAHITA VANZANTEN is a 85 y.o. female.  The history is provided by the patient and medical records. No language interpreter was used.  Shortness of Breath Severity:  Moderate Onset quality:  Gradual Duration:  3 weeks Timing:  Constant Progression:  Worsening Chronicity:  New Relieved by:  Nothing Worsened by:  Nothing Ineffective treatments:  None tried Associated symptoms: wheezing   Associated symptoms: no abdominal pain, no chest pain, no claudication, no cough, no diaphoresis, no fever, no headaches, no neck pain, no rash, no sputum production and no vomiting        Past Medical History:  Diagnosis Date   GERD 10/03/2007   HYPERTENSION 10/03/2007   OSTEOPENIA 02/03/2008   Primary hyperparathyroidism ([place]) 10/03/2007    Patient Active Problem List   Diagnosis Date Noted   Vitamin D deficiency, unspecified 10/13/2018   Hyponatremia 10/13/2018   Anxiety disorder, unspecified 08/22/2018   Unstable gait 08/22/2018   Disorder of bone and cartilage 02/03/2008   PRIMARY HYPERPARATHYROIDISM 10/03/2007   Essential hypertension 10/03/2007   GERD 10/03/2007    Past Surgical History:  Procedure Laterality Date   ABDOMINAL HYSTERECTOMY       OB History   No obstetric history on file.     No family history on file.  Social History   Tobacco Use   Smoking status: Former Smoker    Quit date: 11/12/1978    Years since quitting: 41.0   Smokeless tobacco: Never Used  Substance Use Topics   Alcohol use: No   Drug use: No    Home Medications Prior to Admission medications   Medication Sig Start Date End Date Taking? Authorizing Provider  acetaminophen (TYLENOL) 500 MG tablet Take 500 mg by mouth every 6 (six) hours as needed (arthritis pain).    [provider]  amLODipine  (NORVASC) 2.5 MG tablet Take 2 tablets (5 mg total) by mouth daily. TAKE 1 TABLET EVERY DAY WITH BREAKFAST 11/11/19   [place], [name], MD  benazepril (LOTENSIN) 40 MG tablet Take 1 tablet (40 mg total) by mouth daily. 07/10/19   [place], [name], MD  furosemide (LASIX) 40 MG tablet Take 1 tablet 3 times weekly 07/06/19   [place], [name], MD  ibuprofen (ADVIL,MOTRIN) 200 MG tablet Take 2 tablets (400 mg total) by mouth daily as needed for moderate pain. 01/19/15   Marletta Lor, MD  latanoprost (XALATAN) 0.005 % ophthalmic solution INSTILL 1 DROP INTO BOTH EYES AT BEDTIME Patient taking differently: Place 1 drop into both eyes at bedtime.  10/29/17   Marletta Lor, MD  LORazepam (ATIVAN) 0.5 MG tablet Take 1 tablet (0.5 mg total) by mouth daily as needed for anxiety. for anxiety 08/22/18   [place], [name], MD  meclizine (ANTIVERT) 25 MG tablet Take 1 tablet (25 mg total) by mouth 4 (four) times daily as needed. 12/27/14   Marletta Lor, MD  omeprazole (PRILOSEC) 20 MG capsule Take 1 capsule (20 mg total) by mouth daily. 10/27/18   [place], [name], MD  VITAMIN D PO Take 2 mg by mouth daily.    [provider]    Allergies    Codeine  Review of Systems   Review of Systems  Constitutional: Positive for fatigue. Negative for chills, diaphoresis and fever.  HENT: Negative for congestion.  Respiratory: Positive for chest tightness, shortness of breath and wheezing. Negative for cough, sputum production, choking and stridor.   Cardiovascular: Positive for leg swelling (chronic). Negative for chest pain, palpitations and claudication.  Gastrointestinal: Negative for abdominal pain, constipation, diarrhea and vomiting.  Genitourinary: Negative for enuresis and flank pain.  Musculoskeletal: Negative for back pain, neck pain and neck stiffness.  Skin: Negative for rash and wound.  Neurological: Negative for light-headedness and headaches.  Psychiatric/Behavioral: Negative for  agitation.  All other systems reviewed and are negative.   Physical Exam Updated Vital Signs SpO2 95%   Physical Exam Vitals and nursing note reviewed.  Constitutional:      General: She is not in acute distress.    Appearance: She is well-developed. She is not ill-appearing, toxic-appearing or diaphoretic.  HENT:     Head: Normocephalic and atraumatic.     Right Ear: External ear normal.     Left Ear: External ear normal.     Nose: Nose normal. No congestion or rhinorrhea.     Mouth/Throat:     Mouth: Mucous membranes are moist.     Pharynx: No oropharyngeal exudate or posterior oropharyngeal erythema.  Eyes:     Extraocular Movements: Extraocular movements intact.     Conjunctiva/sclera: Conjunctivae normal.     Pupils: Pupils are equal, round, and reactive to light.  Cardiovascular:     Rate and Rhythm: Normal rate. Rhythm irregular.     Pulses: Normal pulses.     Heart sounds: No murmur.  Pulmonary:     Effort: Pulmonary effort is normal. No respiratory distress.     Breath sounds: No stridor. Wheezing, rhonchi and rales present.  Chest:     Chest wall: No tenderness.  Abdominal:     General: There is no distension.     Tenderness: There is no abdominal tenderness. There is no right CVA tenderness, left CVA tenderness or rebound.  Musculoskeletal:        General: No tenderness or signs of injury.     Cervical back: Normal range of motion and neck supple. No tenderness.     Right lower leg: Edema present.     Left lower leg: Edema present.  Skin:    General: Skin is warm.     Capillary Refill: Capillary refill takes less than 2 seconds.     Findings: No erythema or rash.  Neurological:     General: No focal deficit present.     Mental Status: She is alert and oriented to person, place, and time.     Sensory: No sensory deficit.     Motor: No weakness or abnormal muscle tone.     Deep Tendon Reflexes: Reflexes are normal and symmetric.  Psychiatric:        Mood  and Affect: Mood normal.     ED Results / Procedures / Treatments   Labs (all labs ordered are listed, but only abnormal results are displayed) Labs Reviewed  D-DIMER, QUANTITATIVE (NOT AT [facility]) - Abnormal; Notable for the following components:      Result Value   D-Dimer, Quant 2.46 (*)    All other components within normal limits  CBC WITH DIFFERENTIAL/PLATELET - Abnormal; Notable for the following components:   Monocytes Absolute 1.1 (*)    All other components within normal limits  COMPREHENSIVE METABOLIC PANEL - Abnormal; Notable for the following components:   Sodium 123 (*)    Chloride 88 (*)    Glucose, Bld 107 (*)  Calcium 10.9 (*)    Total Protein 6.3 (*)    GFR calc non Af Amer 54 (*)    All other components within normal limits  LACTIC ACID, PLASMA - Abnormal; Notable for the following components:   Lactic Acid, Venous 2.0 (*)    All other components within normal limits  BRAIN NATRIURETIC PEPTIDE - Abnormal; Notable for the following components:   B Natriuretic Peptide 489.6 (*)    All other components within normal limits  COMPREHENSIVE METABOLIC PANEL - Abnormal; Notable for the following components:   Sodium 125 (*)    Chloride 91 (*)    Glucose, Bld 160 (*)    Calcium 10.8 (*)    Total Protein 6.0 (*)    GFR calc non Af Amer 60 (*)    All other components within normal limits  TROPONIN I (HIGH SENSITIVITY) - Abnormal; Notable for the following components:   Troponin I (High Sensitivity) 25 (*)    All other components within normal limits  TROPONIN I (HIGH SENSITIVITY) - Abnormal; Notable for the following components:   Troponin I (High Sensitivity) 28 (*)    All other components within normal limits  SARS CORONAVIRUS 2 (TAT 6-24 HRS)  MAGNESIUM  HEPARIN LEVEL (UNFRACTIONATED)  CBC  HEPARIN LEVEL (UNFRACTIONATED)  HEPARIN LEVEL (UNFRACTIONATED)  CBC  POC SARS CORONAVIRUS 2 AG -  ED    EKG EKG Interpretation  Date/Time:  Thursday November 12 2019 15:53:57 EST Ventricular Rate:  82 PR Interval:    QRS Duration: 77 QT Interval:  330 QTC Calculation: 381 R Axis:   -36 Text Interpretation: Atrial flutter Left axis deviation Probable anteroseptal infarct, old Borderline ST elevation, lateral leads When compared to prior, concern for new afib. No STEMI Confirmed by Antony Blackbird [phone]) on 11/12/2019 4:10:09 PM   Radiology CT Angio Chest PE W and/or Wo Contrast  Result Date: 11/12/2019 CLINICAL DATA:  PE suspected, low/intermediate prob, positive D-dimer Hypoxia, shortness of breath, positive D-dimer. EXAM: CT ANGIOGRAPHY CHEST WITH CONTRAST TECHNIQUE: Multidetector CT imaging of the chest was performed using the standard protocol during bolus administration of intravenous contrast. Multiplanar CT image reconstructions and MIPs were obtained to evaluate the vascular anatomy. CONTRAST:  39mL OMNIPAQUE IOHEXOL 350 MG/ML SOLN COMPARISON:  Radiograph earlier this day. FINDINGS: Cardiovascular: Positive acute PE with nonocclusive filling defects involving the distal main right pulmonary artery. Subsegmental filling defects in the right upper lobe, with lobar filling defects in the right middle and lower lobe. Additional segmental and subsegmental lower lobe emboli which are partially occlusive. Subsegmental emboli in the left upper and lower lobes. Thromboembolic burden is small to moderate. Straightening of the intraventricular septum with RV to LV ratio of 1.1. Mild contrast refluxing into the hepatic veins and IVC. Mild multi chamber cardiomegaly. Atherosclerosis of the thoracic aorta. No aortic dissection. Mediastinum/Nodes: No adenopathy. No esophageal wall thickening. No thyroid nodule. Lungs/Pleura: Breathing motion artifact partially obscures basilar evaluation. No pulmonary infarct. Linear opacity in the superior segment of the left lower lobe and anterior left upper lobe may represent atelectasis or scarring. There is a 6 x 4 mm nodule in  the right lower lobe (mean 5 mm), series 7, image 52. 4 mm perifissural nodule in the right lung same image. No pulmonary edema. No pleural fluid. Upper Abdomen: Contrast refluxes into the hepatic veins and IVC. No other acute findings in the upper abdomen. Musculoskeletal: Multilevel degenerative change in the thoracic spine. There are no acute or suspicious osseous abnormalities.  Review of the MIP images confirms the above findings. IMPRESSION: 1. Positive acute PE. Small-moderate clot burden primarily in the right lung, filling defects in the distal main pulmonary artery extending into lobar and subsegmental branches. Additional subsegmental pulmonary emboli in the left upper and lower lobes. Evidence of right heart strain with RV to LV ratio of 1.1. 2. Two pulmonary nodules in the right lung, largest measuring 5 mm. No follow-up needed if patient is low-risk (and has no known or suspected primary neoplasm). Non-contrast chest CT can be considered in 12 months if patient is high-risk. This recommendation follows the consensus statement: Guidelines for Management of Incidental Pulmonary Nodules Detected on CT Images: From the Fleischner Society 2017; Radiology 2017; 284:228-243. Aortic Atherosclerosis (ICD10-I70.0). Critical Value/emergent results were called by telephone at the time of interpretation on 11/12/2019 at 10:58 pm to Dr Sedonia Small, who verbally acknowledged these results. Electronically Signed   By: Keith Rake M.D.   On: 11/12/2019 22:59   DG Chest Portable 1 View  Result Date: 11/12/2019 CLINICAL DATA:  Hypoxia. EXAM: PORTABLE CHEST 1 VIEW COMPARISON:  08/14/2018. FINDINGS: Again noted is borderline cardiomegaly. The thoracic aorta is tortuous. There are aortic calcifications. There is no pneumothorax. No large pleural effusion. There is some atelectasis versus scarring at the lung bases. IMPRESSION: 1. Borderline cardiomegaly.  Bibasilar atelectasis versus scarring. 2. Aortic atherosclerosis.  Electronically Signed   By: Constance Holster M.D.   On: 11/12/2019 16:12   ECHOCARDIOGRAM COMPLETE  Result Date: 11/13/2019   ECHOCARDIOGRAM REPORT   Patient Name:   JANIECE PION Date of Exam: 11/13/2019 Medical Rec #:  [id number]       Height:       63.0 in Accession #:    [id number]      Weight:       189.8 lb Date of Birth:  Mar 10, 1930      BSA:          1.89 m Patient Age:    40 years        BP:           137/73 mmHg Patient Gender: F               HR:           81 bpm. Exam Location:  Inpatient Procedure: 2D Echo Indications:    pulmonary embolus 415.19  History:        Patient has no prior history of Echocardiogram examinations.                 Arrythmias:Atrial Fibrillation; Risk Factors:Hypertension.  Sonographer:    Johny Chess Referring Phys: Hecker  1. Left ventricular ejection fraction, by visual estimation, is 60 to 65%. The left ventricle has normal function. There is borderline left ventricular hypertrophy.  2. Left ventricular diastolic parameters are indeterminate.  3. The left ventricle has no regional wall motion abnormalities.  4. Global right ventricle has normal systolic function.The right ventricular size is mildly enlarged. No increase in right ventricular wall thickness.  5. Left atrial size was normal.  6. Right atrial size was normal.  7. The mitral valve is grossly normal. Mild mitral valve regurgitation.  8. The tricuspid valve is grossly normal.  9. The aortic valve is tricuspid. Aortic valve regurgitation is not visualized. 10. The pulmonic valve was grossly normal. Pulmonic valve regurgitation is trivial. 11. Mildly elevated pulmonary artery systolic pressure. 12. The tricuspid regurgitant velocity is 2.80 m/s, and with  an assumed right atrial pressure of 8 mmHg, the estimated right ventricular systolic pressure is mildly elevated at 39.4 mmHg. 13. The inferior vena cava is dilated in size with >50% respiratory variability, suggesting right  atrial pressure of 8 mmHg. 14. Evidence of atrial level shunting detected by color flow Doppler. FINDINGS  Left Ventricle: Left ventricular ejection fraction, by visual estimation, is 60 to 65%. The left ventricle has normal function. The left ventricle has no regional wall motion abnormalities. The left ventricular internal cavity size was the left ventricle is normal in size. There is borderline left ventricular hypertrophy. Left ventricular diastolic parameters are indeterminate. Right Ventricle: The right ventricular size is mildly enlarged. No increase in right ventricular wall thickness. Global RV systolic function is has normal systolic function. The tricuspid regurgitant velocity is 2.80 m/s, and with an assumed right atrial  pressure of 8 mmHg, the estimated right ventricular systolic pressure is mildly elevated at 39.4 mmHg. Left Atrium: Left atrial size was normal in size. Right Atrium: Right atrial size was normal in size Pericardium: There is no evidence of pericardial effusion. Mitral Valve: The mitral valve is grossly normal. Mild mitral valve regurgitation. Tricuspid Valve: The tricuspid valve is grossly normal. Tricuspid valve regurgitation moderate-severe. Aortic Valve: The aortic valve is tricuspid. Aortic valve regurgitation is not visualized. Mild aortic valve annular calcification. Pulmonic Valve: The pulmonic valve was grossly normal. Pulmonic valve regurgitation is trivial. Pulmonic regurgitation is trivial. Aorta: The aortic root is normal in size and structure. Venous: The inferior vena cava is dilated in size with greater than 50% respiratory variability, suggesting right atrial pressure of 8 mmHg. IAS/Shunts: Evidence of atrial level shunting detected by color flow Doppler.  LEFT VENTRICLE PLAX 2D LVIDd:         3.60 cm LV PW:         1.00 cm LV IVS:        1.00 cm LVOT diam:     2.10 cm LVOT Area:     3.46 cm  LEFT ATRIUM             Index       RIGHT ATRIUM           Index LA Vol  (A2C):   50.7 ml 26.81 ml/m RA Area:     16.60 cm LA Vol (A4C):   62.1 ml 32.84 ml/m RA Volume:   42.00 ml  22.21 ml/m LA Biplane Vol: 60.5 ml 31.99 ml/m   AORTA Ao Root diam: 3.10 cm TRICUSPID VALVE TR Peak grad:   31.4 mmHg TR Vmax:        280.00 cm/s  SHUNTS Systemic Diam: 2.10 cm  Rozann Lesches MD Electronically signed by Rozann Lesches MD Signature Date/Time: 11/13/2019/2:53:05 PM    Final     Procedures Procedures (including critical care time)  Medications Ordered in ED Medications  latanoprost (XALATAN) 0.005 % ophthalmic solution 1 drop (1 drop Both Eyes Given 11/13/19 2213)  heparin ADULT infusion 100 units/mL (25000 units/268mL sodium chloride 0.45%) (1,100 Units/hr Intravenous New Bag/Given 11/13/19 1955)  0.9 %  sodium chloride infusion ( Intravenous New Bag/Given 11/13/19 0258)  ondansetron (ZOFRAN) tablet 4 mg (has no administration in time range)    Or  ondansetron (ZOFRAN) injection 4 mg (has no administration in time range)  acetaminophen (TYLENOL) tablet 650 mg (has no administration in time range)    Or  acetaminophen (TYLENOL) suppository 650 mg (has no administration in time range)  LORazepam (ATIVAN) tablet 0.5  mg (has no administration in time range)  benazepril (LOTENSIN) tablet 40 mg (40 mg Oral Given 11/13/19 2212)  pantoprazole (PROTONIX) EC tablet 40 mg (40 mg Oral Given 11/13/19 1900)  amLODipine (NORVASC) tablet 5 mg (5 mg Oral Given 11/13/19 1900)  methylPREDNISolone sodium succinate (SOLU-MEDROL) 125 mg/2 mL injection 125 mg (125 mg Intravenous Given 11/12/19 1758)  albuterol (VENTOLIN HFA) 108 (90 Base) MCG/ACT inhaler 2 puff (2 puffs Inhalation Given 11/12/19 1614)  ipratropium (ATROVENT HFA) inhaler 2 puff (2 puffs Inhalation Given 11/12/19 1614)  iohexol (OMNIPAQUE) 350 MG/ML injection 75 mL (75 mLs Intravenous Contrast Given 11/12/19 2233)  heparin bolus via infusion 4,000 Units (4,000 Units Intravenous Bolus from Bag 11/13/19 0003)    ED Course  I have  reviewed the triage vital signs and the nursing notes.  Pertinent labs & imaging results that were available during my care of the patient were reviewed by me and considered in my medical decision making (see chart for details).    MDM Rules/Calculators/A&P                      REMELL PRUSAK is a pleasant 85 y.o. female with a past medical history significant for CHF on Lasix, primary hyperparathyroidism, hypertension, GERD, and osteopenia who presents for increased shortness of breath, wheezing, and fatigue.  Patient reports that she has had noisier wheezing with her breathing noticed by family who prompted her to come to the emergency department.  She reports that over the last few weeks she has had worsened exertional shortness of breath.  She would normally take the trash out without difficulty but is now getting very winded and has to stop.  She denies any chest pain, chest pressure, or palpitations.  She denies any nausea, vomiting, no urinary symptoms.  She does report chronic constipation.  She denies diarrhea.  She denies any trauma.  She denies any syncopal episodes.  She denies any fevers, chills, congestion, or new cough.  She is primarily because of the noisy wheezing and shortness of breath with fatigue.  She does report that she is unable to lay flat and has not been able to do so in "some time".  On exam, she does have wheezing, rhonchi, and rales in all lung fields.  Chest and abdomen are nontender.  Legs were slightly edematous but she reports it is unchanged from her baseline.  She reports she does take her Lasix as directed 3 days a week.  No focal neurologic deficits.  On my initial evaluation, patient was very tachypneic and extremely short of breath during conversation.  I looked up her pulse ox and it showed 78% on room air.  She was quickly placed on 2 L which was then escalated to 6 L to maintain oxygen saturations in the 90s.  She is now on 6 L nasal cannula to maintain O2  sat.  Based on her description of symptoms, I am concerned about CHF exacerbation versus development of reactive airway disease versus Covid given the ongoing pandemic.  I am also concerned about pneumonia although she does not have significant cough or known sick contacts.  We will get screening labs, chest x-ray, and evaluate the patient.  EKG shows atrial fibrillation, no history of A. fib seen in the chart.  We will also get D-dimer given the new hypoxia and shortness of breath.  Anticipate admission for new hypoxia requiring oxygen saturations once work-up is completed.  D-dimer was elevated and we are  waiting on CT PE study.  Initial Covid test is negative but the 6-hour test was ordered.  Delta troponin is similar.  She is still on 6 L to maintain oxygen saturations.  She will be admitted for hypoxia after CT scan is completed to rule out pulmonary embolism as primary cause.    Final Clinical Impression(s) / ED Diagnoses Final diagnoses:  Hypoxia  Elevated brain natriuretic peptide (BNP) level    Rx / DC Orders ED Discharge Orders    None     Clinical Impression: 1. Hypoxia   2. Elevated brain natriuretic peptide (BNP) level     Disposition: Admit  This note was prepared with assistance of Dragon voice recognition software. Occasional wrong-word or sound-a-like substitutions may have occurred due to the inherent limitations of voice recognition software.     Oluchi Pucci, Gwenyth Allegra, MD 11/13/19 2220

## 2019-11-12 NOTE — Progress Notes (Signed)
ANTICOAGULATION CONSULT NOTE - Initial Consult  Pharmacy Consult for Heparin Indication: pulmonary embolus  Allergies  Allergen Reactions  . Codeine Nausea And Vomiting    Patient Measurements:   Heparin Dosing Weight: 75 kg  Vital Signs: Temp: 97.8 F (36.6 C) (12/31 1558) Temp Source: Oral (12/31 1558) BP: 134/90 (12/31 2100) Pulse Rate: 81 (12/31 2100)  Labs: Recent Labs    11/12/19 1606 11/12/19 1757  HGB 14.3  --   HCT 42.7  --   PLT 234  --   CREATININE 0.95  --   TROPONINIHS 25* 28*    CrCl cannot be calculated (Unknown ideal weight.).   Medical History: Past Medical History:  Diagnosis Date  . GERD 10/03/2007  . HYPERTENSION 10/03/2007  . OSTEOPENIA 02/03/2008  . Primary hyperparathyroidism ([place]) 10/03/2007    Medications:  No current facility-administered medications on file prior to encounter.   Current Outpatient Medications on File Prior to Encounter  Medication Sig Dispense Refill  . acetaminophen (TYLENOL) 500 MG tablet Take 500 mg by mouth every 6 (six) hours as needed (arthritis pain).    Marland Kitchen amLODipine (NORVASC) 2.5 MG tablet Take 2 tablets (5 mg total) by mouth daily. TAKE 1 TABLET EVERY DAY WITH BREAKFAST 90 tablet 3  . benazepril (LOTENSIN) 40 MG tablet Take 1 tablet (40 mg total) by mouth daily. 90 tablet 3  . furosemide (LASIX) 40 MG tablet Take 1 tablet 3 times weekly 30 tablet 3  . ibuprofen (ADVIL,MOTRIN) 200 MG tablet Take 2 tablets (400 mg total) by mouth daily as needed for moderate pain. 180 tablet 1  . latanoprost (XALATAN) 0.005 % ophthalmic solution INSTILL 1 DROP INTO BOTH EYES AT BEDTIME (Patient taking differently: Place 1 drop into both eyes at bedtime. ) 7.5 mL 3  . LORazepam (ATIVAN) 0.5 MG tablet Take 1 tablet (0.5 mg total) by mouth daily as needed for anxiety. for anxiety 30 tablet 0  . meclizine (ANTIVERT) 25 MG tablet Take 1 tablet (25 mg total) by mouth 4 (four) times daily as needed. 90 tablet 3  . omeprazole  (PRILOSEC) 20 MG capsule Take 1 capsule (20 mg total) by mouth daily. 90 capsule 3  . VITAMIN D PO Take 2 mg by mouth daily.       Assessment: 85 y.o. female with PE for heparin Goal of Therapy:  Heparin level 0.3-0.7 units/ml Monitor platelets by anticoagulation protocol: Yes   Plan:  Heparin 4000 units IV bolus, then start heparin 1100 units/hr Check heparin level in 8 hours.   Caryl Pina 11/12/2019,11:23 PM

## 2019-11-12 NOTE — ED Notes (Signed)
Patient transported to CT 

## 2019-11-12 NOTE — ED Notes (Signed)
ED TO INPATIENT HANDOFF REPORT  ED Nurse Name and Phone #: William Hamburger, [place] Kirkpatrick  S Name/Age/Gender Annette [name] 85 y.o. female Room/Bed: 041C/041C  Code Status   Code Status: Not on file  Home/SNF/Other Home Patient oriented to: self, place, time and situation Is this baseline? No   Triage Complete: Triage complete  Chief Complaint SOB  Triage Note Pt from home, reports 1 month of increased SOB, wheezing noted by EMS, satting mid 30's on RA, pt very anxious. A&O x4. Afebrile and no known sick contacts. Denies CP or other complaints.     Allergies Allergies  Allergen Reactions  . Codeine Nausea And Vomiting    Level of Care/Admitting Diagnosis ED Disposition    ED Disposition Condition Comment   Admit  The patient appears reasonably stabilized for admission considering the current resources, flow, and capabilities available in the ED at this time, and I doubt any other [facility] requiring further screening and/or treatment in the ED prior to admission is  present.       B Medical/Surgery History Past Medical History:  Diagnosis Date  . GERD 10/03/2007  . HYPERTENSION 10/03/2007  . OSTEOPENIA 02/03/2008  . Primary hyperparathyroidism ([place]) 10/03/2007   Past Surgical History:  Procedure Laterality Date  . ABDOMINAL HYSTERECTOMY       A IV Location/Drains/Wounds Patient Lines/Drains/Airways Status   Active Line/Drains/Airways    Name:   Placement date:   Placement time:   Site:   Days:   Peripheral IV 11/12/19 Right;Posterior Forearm   11/12/19    1722    Forearm   less than 1          Intake/Output Last 24 hours No intake or output data in the 24 hours ending 11/12/19 2327  Labs/Imaging Results for orders placed or performed during the hospital encounter of 11/12/19 (from the past 48 hour(s))  Troponin I (High Sensitivity)     Status: Abnormal   Collection Time: 11/12/19  4:06 PM  Result Value Ref Range   Troponin I (High Sensitivity) 25 (H) <18 ng/L     Comment: (NOTE) Elevated high sensitivity troponin I (hsTnI) values and significant  changes across serial measurements may suggest ACS but many other  chronic and acute conditions are known to elevate hsTnI results.  Refer to the "Links" section for chest pain algorithms and additional  guidance. Performed at [facility], [place] [address] [place], [place] [address]   D-dimer, quantitative (not at [facility])     Status: Abnormal   Collection Time: 11/12/19  4:06 PM  Result Value Ref Range   D-Dimer, Quant 2.46 (H) 0.00 - 0.50 ug/mL-FEU    Comment: (NOTE) At the manufacturer cut-off of 0.50 ug/mL FEU, this assay has been documented to exclude PE with a sensitivity and negative predictive value of 97 to 99%.  At this time, this assay has not been approved by the FDA to exclude DVT/VTE. Results should be correlated with clinical presentation. Performed at [facility], [place] [address] [place], [place] [address]   CBC with Differential     Status: Abnormal   Collection Time: 11/12/19  4:06 PM  Result Value Ref Range   WBC 6.6 4.0 - 10.5 K/uL   RBC 4.88 3.87 - 5.11 MIL/uL   Hemoglobin 14.3 12.0 - 15.0 g/dL   HCT 42.7 36.0 - 46.0 %   MCV 87.5 80.0 - 100.0 fL   MCH 29.3 26.0 - 34.0 pg   MCHC 33.5  30.0 - 36.0 g/dL   RDW 13.2 11.5 - 15.5 %   Platelets 234 150 - 400 K/uL   nRBC 0.0 0.0 - 0.2 %   Neutrophils Relative % 72 %   Neutro Abs 4.8 1.7 - 7.7 K/uL   Lymphocytes Relative 10 %   Lymphs Abs 0.7 0.7 - 4.0 K/uL   Monocytes Relative 17 %   Monocytes Absolute 1.1 (H) 0.1 - 1.0 K/uL   Eosinophils Relative 1 %   Eosinophils Absolute 0.0 0.0 - 0.5 K/uL   Basophils Relative 0 %   Basophils Absolute 0.0 0.0 - 0.1 K/uL   Immature Granulocytes 0 %   Abs Immature Granulocytes 0.01 0.00 - 0.07 K/uL    Comment: Performed at [facility] [address], [place], [place] [address]  Comprehensive metabolic panel     Status: Abnormal   Collection Time: 11/12/19  4:06 PM   Result Value Ref Range   Sodium 123 (L) 135 - 145 mmol/L   Potassium 4.2 3.5 - 5.1 mmol/L   Chloride 88 (L) 98 - 111 mmol/L   CO2 25 22 - 32 mmol/L   Glucose, Bld 107 (H) 70 - 99 mg/dL   BUN 16 8 - 23 mg/dL   Creatinine, Ser 0.95 0.44 - 1.00 mg/dL   Calcium 10.9 (H) 8.9 - 10.3 mg/dL   Total Protein 6.3 (L) 6.5 - 8.1 g/dL   Albumin 3.9 3.5 - 5.0 g/dL   AST 37 15 - 41 U/L   ALT 37 0 - 44 U/L   Alkaline Phosphatase 77 38 - 126 U/L   Total Bilirubin 0.9 0.3 - 1.2 mg/dL   GFR calc non Af Amer 54 (L) >60 mL/min   GFR calc Af Amer >60 >60 mL/min   Anion gap 10 5 - 15    Comment: Performed at [facility], [place] [address] [place], [place] [address]  Lactic acid, plasma     Status: Abnormal   Collection Time: 11/12/19  4:06 PM  Result Value Ref Range   Lactic Acid, Venous 2.0 (HH) 0.5 - 1.9 mmol/L    Comment: CRITICAL RESULT CALLED TO, READ BACK BY AND VERIFIED WITH: Alyse Low 1701 11/12/2019 WBOND Performed at [facility], [place] [address] [place], [place] [address]   Brain natriuretic peptide     Status: Abnormal   Collection Time: 11/12/19  4:06 PM  Result Value Ref Range   B Natriuretic Peptide 489.6 (H) 0.0 - 100.0 pg/mL    Comment: Performed at [facility] [address], [place], [place] [address]  Magnesium     Status: None   Collection Time: 11/12/19  4:06 PM  Result Value Ref Range   Magnesium 1.8 1.7 - 2.4 mg/dL    Comment: Performed at [facility] [address], [place], [place] [address]  POC SARS Coronavirus 2 Ag-ED - Nasal Swab (BD Veritor Kit)     Status: None   Collection Time: 11/12/19  4:41 PM  Result Value Ref Range   SARS Coronavirus 2 Ag NEGATIVE NEGATIVE    Comment: (NOTE) SARS-CoV-2 antigen NOT DETECTED.  Negative results are presumptive.  Negative results do not preclude SARS-CoV-2 infection and should not be used as the sole basis for treatment or other patient management decisions, including infection  control decisions,  particularly in the presence of clinical signs and  symptoms consistent with COVID-19, or in those who have been in contact with the virus.  Negative results must be combined with clinical observations, patient history, and epidemiological information. The expected result is Negative. Fact Sheet for Patients: PodPark.tn Fact Sheet for Healthcare Providers: GiftContent.is This test is not yet approved or cleared by the [place] FDA and  has been authorized for detection and/or diagnosis of SARS-CoV-2 by FDA under an Emergency Use Authorization (EUA).  This EUA will remain in effect (meaning this test can be used) for the duration of  the COVID-19 de claration under Section 564(b)(1) of the Act, 21 U.S.C. section 360bbb-3(b)(1), unless the authorization is terminated or revoked sooner.   Troponin I (High Sensitivity)     Status: Abnormal   Collection Time: 11/12/19  5:57 PM  Result Value Ref Range   Troponin I (High Sensitivity) 28 (H) <18 ng/L    Comment: (NOTE) Elevated high sensitivity troponin I (hsTnI) values and significant  changes across serial measurements may suggest ACS but many other  chronic and acute conditions are known to elevate hsTnI results.  Refer to the "Links" section for chest pain algorithms and additional  guidance. Performed at [facility], [place] [address] [place], [place] [address]   SARS CORONAVIRUS 2 (TAT 6-24 HRS) Nasopharyngeal Nasopharyngeal Swab     Status: None   Collection Time: 11/12/19  5:57 PM   Specimen: Nasopharyngeal Swab  Result Value Ref Range   SARS Coronavirus 2 NEGATIVE NEGATIVE    Comment: (NOTE) SARS-CoV-2 target nucleic acids are NOT DETECTED. The SARS-CoV-2 RNA is generally detectable in upper and lower respiratory specimens during the acute phase of infection. Negative results do not preclude SARS-CoV-2 infection, do not rule out co-infections with other  pathogens, and should not be used as the sole basis for treatment or other patient management decisions. Negative results must be combined with clinical observations, patient history, and epidemiological information. The expected result is Negative. Fact Sheet for Patients: SugarRoll.be Fact Sheet for Healthcare Providers: https://www.woods-mathews.com/ This test is not yet approved or cleared by the [place] FDA and  has been authorized for detection and/or diagnosis of SARS-CoV-2 by FDA under an Emergency Use Authorization (EUA). This EUA will remain  in effect (meaning this test can be used) for the duration of the COVID-19 declaration under Section 56 4(b)(1) of the Act, 21 U.S.C. section 360bbb-3(b)(1), unless the authorization is terminated or revoked sooner. Performed at [facility], [place] [address] [place], [place] [address]    CT Angio Chest PE W and/or Wo Contrast  Result Date: 11/12/2019 CLINICAL DATA:  PE suspected, low/intermediate prob, positive D-dimer Hypoxia, shortness of breath, positive D-dimer. EXAM: CT ANGIOGRAPHY CHEST WITH CONTRAST TECHNIQUE: Multidetector CT imaging of the chest was performed using the standard protocol during bolus administration of intravenous contrast. Multiplanar CT image reconstructions and MIPs were obtained to evaluate the vascular anatomy. CONTRAST:  35m OMNIPAQUE IOHEXOL 350 MG/ML SOLN COMPARISON:  Radiograph earlier this day. FINDINGS: Cardiovascular: Positive acute PE with nonocclusive filling defects involving the distal main right pulmonary artery. Subsegmental filling defects in the right upper lobe, with lobar filling defects in the right middle and lower lobe. Additional segmental and subsegmental lower lobe emboli which are partially occlusive. Subsegmental emboli in the left upper and lower lobes. Thromboembolic burden is small to moderate. Straightening of the intraventricular  septum with RV to LV ratio of 1.1. Mild contrast refluxing into the hepatic veins and IVC. Mild multi chamber cardiomegaly. Atherosclerosis of the thoracic aorta. No aortic dissection. Mediastinum/Nodes: No adenopathy. No esophageal wall thickening.  No thyroid nodule. Lungs/Pleura: Breathing motion artifact partially obscures basilar evaluation. No pulmonary infarct. Linear opacity in the superior segment of the left lower lobe and anterior left upper lobe may represent atelectasis or scarring. There is a 6 x 4 mm nodule in the right lower lobe (mean 5 mm), series 7, image 52. 4 mm perifissural nodule in the right lung same image. No pulmonary edema. No pleural fluid. Upper Abdomen: Contrast refluxes into the hepatic veins and IVC. No other acute findings in the upper abdomen. Musculoskeletal: Multilevel degenerative change in the thoracic spine. There are no acute or suspicious osseous abnormalities. Review of the MIP images confirms the above findings. IMPRESSION: 1. Positive acute PE. Small-moderate clot burden primarily in the right lung, filling defects in the distal main pulmonary artery extending into lobar and subsegmental branches. Additional subsegmental pulmonary emboli in the left upper and lower lobes. Evidence of right heart strain with RV to LV ratio of 1.1. 2. Two pulmonary nodules in the right lung, largest measuring 5 mm. No follow-up needed if patient is low-risk (and has no known or suspected primary neoplasm). Non-contrast chest CT can be considered in 12 months if patient is high-risk. This recommendation follows the consensus statement: Guidelines for Management of Incidental Pulmonary Nodules Detected on CT Images: From the Fleischner Society 2017; Radiology 2017; 284:228-243. Aortic Atherosclerosis (ICD10-I70.0). Critical Value/emergent results were called by telephone at the time of interpretation on 11/12/2019 at 10:58 pm to Dr Sedonia Small, who verbally acknowledged these results. Electronically  Signed   By: Keith Rake M.D.   On: 11/12/2019 22:59   DG Chest Portable 1 View  Result Date: 11/12/2019 CLINICAL DATA:  Hypoxia. EXAM: PORTABLE CHEST 1 VIEW COMPARISON:  08/14/2018. FINDINGS: Again noted is borderline cardiomegaly. The thoracic aorta is tortuous. There are aortic calcifications. There is no pneumothorax. No large pleural effusion. There is some atelectasis versus scarring at the lung bases. IMPRESSION: 1. Borderline cardiomegaly.  Bibasilar atelectasis versus scarring. 2. Aortic atherosclerosis. Electronically Signed   By: Constance Holster M.D.   On: 11/12/2019 16:12    Pending Labs Unresulted Labs (From admission, onward)    Start     Ordered   11/12/19 1700  Lactic acid, plasma  Once,   STAT     11/12/19 1700          Vitals/Pain Today's Vitals   11/12/19 2030 11/12/19 2100 11/12/19 2323 11/12/19 2326  BP: 126/78 134/90 (!) 151/67   Pulse: 84 81 88   Resp: 15 20 (!) 22   Temp:      TempSrc:      SpO2: 100% 97% 97%   Weight:    84.8 kg  PainSc:        Isolation Precautions Airborne and Contact precautions  Medications Medications  latanoprost (XALATAN) 0.005 % ophthalmic solution 1 drop (has no administration in time range)  methylPREDNISolone sodium succinate (SOLU-MEDROL) 125 mg/2 mL injection 125 mg (125 mg Intravenous Given 11/12/19 1758)  albuterol (VENTOLIN HFA) 108 (90 Base) MCG/ACT inhaler 2 puff (2 puffs Inhalation Given 11/12/19 1614)  ipratropium (ATROVENT HFA) inhaler 2 puff (2 puffs Inhalation Given 11/12/19 1614)  iohexol (OMNIPAQUE) 350 MG/ML injection 75 mL (75 mLs Intravenous Contrast Given 11/12/19 2233)    Mobility walks with person assist Low fall risk   Focused Assessments Pulmonary Assessment Handoff:  Lung sounds: Bilateral Breath Sounds: Expiratory wheezes, Inspiratory wheezes L Breath Sounds: Expiratory wheezes, Inspiratory wheezes O2 Device: Room Air O2 Flow Rate (L/min): 6 L/min  R Recommendations:  See Admitting Provider Note  Report given to:   Additional Notes:

## 2019-11-13 ENCOUNTER — Inpatient Hospital Stay (HOSPITAL_COMMUNITY): Payer: Medicare HMO

## 2019-11-13 ENCOUNTER — Other Ambulatory Visit: Payer: Self-pay

## 2019-11-13 DIAGNOSIS — I2699 Other pulmonary embolism without acute cor pulmonale: Secondary | ICD-10-CM

## 2019-11-13 DIAGNOSIS — I340 Nonrheumatic mitral (valve) insufficiency: Secondary | ICD-10-CM

## 2019-11-13 DIAGNOSIS — I361 Nonrheumatic tricuspid (valve) insufficiency: Secondary | ICD-10-CM

## 2019-11-13 LAB — COMPREHENSIVE METABOLIC PANEL
ALT: 34 U/L (ref 0–44)
AST: 27 U/L (ref 15–41)
Albumin: 3.6 g/dL (ref 3.5–5.0)
Alkaline Phosphatase: 72 U/L (ref 38–126)
Anion gap: 9 (ref 5–15)
BUN: 14 mg/dL (ref 8–23)
CO2: 25 mmol/L (ref 22–32)
Calcium: 10.8 mg/dL — ABNORMAL HIGH (ref 8.9–10.3)
Chloride: 91 mmol/L — ABNORMAL LOW (ref 98–111)
Creatinine, Ser: 0.87 mg/dL (ref 0.44–1.00)
GFR calc Af Amer: >60 mL/min (ref >60)
GFR calc non Af Amer: 60 mL/min — ABNORMAL LOW (ref >60)
Glucose, Bld: 160 mg/dL — ABNORMAL HIGH (ref 70–99)
Potassium: 4.2 mmol/L (ref 3.5–5.1)
Sodium: 125 mmol/L — ABNORMAL LOW (ref 135–145)
Total Bilirubin: 0.9 mg/dL (ref 0.3–1.2)
Total Protein: 6 g/dL — ABNORMAL LOW (ref 6.5–8.1)

## 2019-11-13 LAB — HEPARIN LEVEL (UNFRACTIONATED)
Heparin Unfractionated: 0.55 IU/mL (ref 0.30–0.70)
Heparin Unfractionated: 0.36 IU/mL (ref 0.30–0.70)

## 2019-11-13 LAB — CBC
HCT: 39.5 % (ref 36.0–46.0)
Hemoglobin: 13.9 g/dL (ref 12.0–15.0)
MCH: 29.7 pg (ref 26.0–34.0)
MCHC: 35.2 g/dL (ref 30.0–36.0)
MCV: 84.4 fL (ref 80.0–100.0)
Platelets: 237 10*3/uL (ref 150–400)
RBC: 4.68 MIL/uL (ref 3.87–5.11)
RDW: 13 % (ref 11.5–15.5)
WBC: 6.8 10*3/uL (ref 4.0–10.5)
nRBC: 0 % (ref 0.0–0.2)

## 2019-11-13 LAB — ECHOCARDIOGRAM COMPLETE
Height: 63 in
Weight: 3036.8 oz

## 2019-11-13 MED ORDER — ONDANSETRON HCL 4 MG PO TABS: 4.0000 mg | ORAL_TABLET | Freq: Four times a day (QID) | ORAL | Status: DC | PRN

## 2019-11-13 MED ORDER — PANTOPRAZOLE SODIUM 40 MG PO TBEC
40.0000 mg | DELAYED_RELEASE_TABLET | Freq: Every day | ORAL | Status: DC
  Administered 2019-11-13 – 2019-11-16 (×4): 40 mg via ORAL
  Filled 2019-11-13 (×4): qty 1

## 2019-11-13 MED ORDER — BENAZEPRIL HCL 40 MG PO TABS
40.0000 mg | ORAL_TABLET | Freq: Every day | ORAL | Status: DC
  Administered 2019-11-13 – 2019-11-16 (×4): 40 mg via ORAL
  Filled 2019-11-13 (×4): qty 1

## 2019-11-13 MED ORDER — ACETAMINOPHEN 650 MG RE SUPP: 650.0000 mg | Freq: Four times a day (QID) | RECTAL | Status: DC | PRN

## 2019-11-13 MED ORDER — ACETAMINOPHEN 325 MG PO TABS
650.0000 mg | ORAL_TABLET | Freq: Four times a day (QID) | ORAL | Status: DC | PRN
  Administered 2019-11-15: 650 mg via ORAL
  Filled 2019-11-13: qty 2

## 2019-11-13 MED ORDER — AMLODIPINE BESYLATE 5 MG PO TABS
5.0000 mg | ORAL_TABLET | Freq: Every day | ORAL | Status: DC
  Administered 2019-11-13 – 2019-11-16 (×4): 5 mg via ORAL
  Filled 2019-11-13 (×4): qty 1

## 2019-11-13 MED ORDER — LORAZEPAM 0.5 MG PO TABS: 0.5000 mg | ORAL_TABLET | Freq: Every day | ORAL | Status: DC | PRN

## 2019-11-13 MED ORDER — SODIUM CHLORIDE 0.9 % IV SOLN: INTRAVENOUS | Status: DC

## 2019-11-13 MED ORDER — ONDANSETRON HCL 4 MG/2ML IJ SOLN: 4.0000 mg | Freq: Four times a day (QID) | INTRAMUSCULAR | Status: DC | PRN

## 2019-11-13 NOTE — Progress Notes (Signed)
ANTICOAGULATION CONSULT NOTE - Initial Consult  Pharmacy Consult for Heparin Indication: pulmonary embolus  Allergies  Allergen Reactions  . Codeine Nausea And Vomiting    Patient Measurements: Height: 5\' 3"  (160 cm) Weight: 189 lb 12.8 oz (86.1 kg) IBW/kg (Calculated) : 52.4 Heparin Dosing Weight: 72 kg  Vital Signs: Temp: 98.5 F (36.9 C) (01/01 0559) Temp Source: Oral (01/01 0559) BP: 135/95 (01/01 0700) Pulse Rate: 85 (01/01 0700)  Labs: Recent Labs    11/12/19 1606 11/12/19 1757 11/13/19 0740  HGB 14.3  --  13.9  HCT 42.7  --  39.5  PLT 234  --  237  HEPARINUNFRC  --   --  0.55  CREATININE 0.95  --  0.87  TROPONINIHS 25* 28*  --     Estimated Creatinine Clearance: 47.4 mL/min (by C-G formula based on SCr of 0.87 mg/dL).   Medical History: Past Medical History:  Diagnosis Date  . GERD 10/03/2007  . HYPERTENSION 10/03/2007  . OSTEOPENIA 02/03/2008  . Primary hyperparathyroidism ([place]) 10/03/2007    Medications:  No current facility-administered medications on file prior to encounter.   Current Outpatient Medications on File Prior to Encounter  Medication Sig Dispense Refill  . acetaminophen (TYLENOL) 500 MG tablet Take 500 mg by mouth every 6 (six) hours as needed (arthritis pain).    Marland Kitchen amLODipine (NORVASC) 2.5 MG tablet Take 2 tablets (5 mg total) by mouth daily. TAKE 1 TABLET EVERY DAY WITH BREAKFAST 90 tablet 3  . benazepril (LOTENSIN) 40 MG tablet Take 1 tablet (40 mg total) by mouth daily. 90 tablet 3  . furosemide (LASIX) 40 MG tablet Take 1 tablet 3 times weekly 30 tablet 3  . ibuprofen (ADVIL,MOTRIN) 200 MG tablet Take 2 tablets (400 mg total) by mouth daily as needed for moderate pain. 180 tablet 1  . latanoprost (XALATAN) 0.005 % ophthalmic solution INSTILL 1 DROP INTO BOTH EYES AT BEDTIME (Patient taking differently: Place 1 drop into both eyes at bedtime. ) 7.5 mL 3  . LORazepam (ATIVAN) 0.5 MG tablet Take 1 tablet (0.5 mg total) by mouth daily  as needed for anxiety. for anxiety 30 tablet 0  . meclizine (ANTIVERT) 25 MG tablet Take 1 tablet (25 mg total) by mouth 4 (four) times daily as needed. (Patient taking differently: Take 25 mg by mouth 4 (four) times daily as needed for dizziness. ) 90 tablet 3  . omeprazole (PRILOSEC) 20 MG capsule Take 1 capsule (20 mg total) by mouth daily. 90 capsule 3  . VITAMIN D PO Take 2,000 Units by mouth daily.        Assessment: 86 y.o. female admitted for CT confirmed, bilateral PE with heart strain. No anticoagulation PTA.   Heparin level this am is therapeutic at 0.55 on 1100 units/hour and a bolus. H&H is stable at 13.9/39.5, plts are wnl at 237. D-dimer from 12/31 elevated at 2.46.    Goal of Therapy:  Heparin level 0.3-0.7 units/ml Monitor platelets by anticoagulation protocol: Yes   Plan:  Continue heparin 1100 units/hr Check confirmatory heparin level in 8 hours.  Monitor daily heparin level and CBC Follow up transition to oral anticoagulant   Thank you,   Eddie Candle, PharmD PGY-1 Pharmacy Resident   Please check amion for clinical pharmacist contact number 11/13/2019,9:25 AM

## 2019-11-13 NOTE — Progress Notes (Signed)
Patient has appeared increasingly confused as the day progressed. Spoke to daughter in law several times during the day and patient calling her and making odd statements, ie she was seeing a dog in the room sitting on a chair.She saw a man sitting in the room and wanted to buy him dinner, (no one was in the room). When patient confused is easily reoriented to hospital and plan of care, but quickly forgets, needed frequent reminders. Unable to assess patient well as she is not in her normal surroundings which could increase confusion but may appears to have some memory problem.

## 2019-11-13 NOTE — Progress Notes (Signed)
PROGRESS NOTE    Annette [name]  [id number] DOB: 03/09/1930 DOA: 11/12/2019 PCP: [place], [name], MD  Outpatient Specialists:   Brief Narrative:  Admitted earlier today, see H&P 11/12/19: to ED w/ SOB and weakness, Dx bilateral PE (CT angiogram of the chest showed acute PE with small moderate clot burden probably in the right lung filling defects.  Also involving the distal main pulmonary artery.  Also 2 pulmonary nodules), started on heparin gtt 11/13/19 - pt initially refused Echo but after assurance that there was no radiation involved she is amenable to this test.     Assessment & Plan:   Principal Problem:   Pulmonary embolism and infarction [facility]) Active Problems:   Primary hyperparathyroidism ([place])   Essential hypertension   GERD   Anxiety disorder, unspecified   Hyponatremia   Bilateral pulmonary embolism:  Appears to be submassive especially in the right lung.  Patient will be admitted and initiated on heparin drip.  Close monitoring.  Oxygen and pain management.  Once patient is hemodynamically stable transition to oral anticoagulants prior to discharge based on her safety profile with her history of frequent falls. I think will be ok to transition later today or first thing tomorrow   Essential hypertension:  Amlodipine 2.5, benazepril 40 restarted today   Hyponatremia:  Patient has chronic hyponatremia.  She probably is dehydrated also. Has been on saline, to monitor BMP again tomorrow   Anxiety disorder: Continue home regimen.  GERD: Continue PPIs      DVT prophylaxis: Heparin drip Code Status: Full code Family Communication: No family at bedside Disposition Plan: To be determined Consults called: Pulmonary critical care Admission status: Inpatien   Procedures: Echocardiogram results pending   Antimicrobials: Anti-infectives (From admission, onward)   None        Subjective: Patient is feeling well, is a bit anxious this  morning. She's been calling family a lot, per nurse report.   Objective: Vitals:   11/13/19 0128 11/13/19 0205 11/13/19 0559 11/13/19 0700  BP: 133/86 (!) 157/84 137/73 (!) 135/95  Pulse: 95  91 85  Resp: (!) 21 19 18    Temp: 98 F (36.7 C) 98 F (36.7 C) 98.5 F (36.9 C)   TempSrc: Oral Oral Oral   SpO2: 98%  94% 94%  Weight: 86.1 kg     Height: 5\' 3"  (1.6 m)       Intake/Output Summary (Last 24 hours) at 11/13/2019 1502 Last data filed at 11/13/2019 0827 Gross per 24 hour  Intake 131.83 ml  Output 650 ml  Net -518.17 ml   Filed Weights   11/12/19 2326 11/13/19 0128  Weight: 84.8 kg 86.1 kg    Examination:  General exam: Appears calm and comfortable  Respiratory system: Clear to auscultation. Respiratory effort normal. Cardiovascular system: S1 & S2 heard, RRR. No JVD, murmurs, rubs, gallops or clicks. No pedal edema. Gastrointestinal system: Abdomen is nondistended, soft and nontender. No organomegaly or masses felt. Normal bowel sounds heard. Central nervous system: Alert and oriented. No focal neurological deficits. Extremities: Symmetric 5 x 5 power. Skin: No rashes, lesions or ulcers Psychiatry: Judgement and insight appear fair. Mood & affect appropriate.     Data Reviewed: I have personally reviewed following labs and imaging studies  CBC: Recent Labs  Lab 11/12/19 1606 11/13/19 0740  WBC 6.6 6.8  NEUTROABS 4.8  --   HGB 14.3 13.9  HCT 42.7 39.5  MCV 87.5 84.4  PLT 234 [id number]   Basic Metabolic Panel:  Recent Labs  Lab 11/12/19 1606 11/13/19 0740  NA 123* 125*  K 4.2 4.2  CL 88* 91*  CO2 25 25  GLUCOSE 107* 160*  BUN 16 14  CREATININE 0.95 0.87  CALCIUM 10.9* 10.8*  MG 1.8  --    GFR: Estimated Creatinine Clearance: 47.4 mL/min (by C-G formula based on SCr of 0.87 mg/dL). Liver Function Tests: Recent Labs  Lab 11/12/19 1606 11/13/19 0740  AST 37 27  ALT 37 34  ALKPHOS 77 72  BILITOT 0.9 0.9  PROT 6.3* 6.0*  ALBUMIN 3.9 3.6   No  results for input(s): LIPASE, AMYLASE in the last 168 hours. No results for input(s): AMMONIA in the last 168 hours. Coagulation Profile: No results for input(s): INR, PROTIME in the last 168 hours. Cardiac Enzymes: No results for input(s): CKTOTAL, CKMB, CKMBINDEX, TROPONINI in the last 168 hours. BNP (last 3 results) No results for input(s): PROBNP in the last 8760 hours. HbA1C: No results for input(s): HGBA1C in the last 72 hours. CBG: No results for input(s): GLUCAP in the last 168 hours. Lipid Profile: No results for input(s): CHOL, HDL, LDLCALC, TRIG, CHOLHDL, LDLDIRECT in the last 72 hours. Thyroid Function Tests: No results for input(s): TSH, T4TOTAL, FREET4, T3FREE, THYROIDAB in the last 72 hours. Anemia Panel: No results for input(s): VITAMINB12, FOLATE, FERRITIN, TIBC, IRON, RETICCTPCT in the last 72 hours. Urine analysis:    Component Value Date/Time   COLORURINE YELLOW 08/14/2018 1723   APPEARANCEUR CLEAR 08/14/2018 1723   LABSPEC 1.013 08/14/2018 1723   PHURINE 5.0 08/14/2018 1723   GLUCOSEU NEGATIVE 08/14/2018 1723   HGBUR NEGATIVE 08/14/2018 1723   BILIRUBINUR NEGATIVE 08/14/2018 1723   KETONESUR NEGATIVE 08/14/2018 1723   PROTEINUR NEGATIVE 08/14/2018 1723   NITRITE NEGATIVE 08/14/2018 1723   LEUKOCYTESUR NEGATIVE 08/14/2018 1723   Sepsis Labs: @LABRCNTIP (procalcitonin:4,lacticidven:4)  ) Recent Results (from the past 240 hour(s))  SARS CORONAVIRUS 2 (TAT 6-24 HRS) Nasopharyngeal Nasopharyngeal Swab     Status: None   Collection Time: 11/12/19  5:57 PM   Specimen: Nasopharyngeal Swab  Result Value Ref Range Status   SARS Coronavirus 2 NEGATIVE NEGATIVE Final    Comment: (NOTE) SARS-CoV-2 target nucleic acids are NOT DETECTED. The SARS-CoV-2 RNA is generally detectable in upper and lower respiratory specimens during the acute phase of infection. Negative results do not preclude SARS-CoV-2 infection, do not rule out co-infections with other pathogens,  and should not be used as the sole basis for treatment or other patient management decisions. Negative results must be combined with clinical observations, patient history, and epidemiological information. The expected result is Negative. Fact Sheet for Patients: SugarRoll.be Fact Sheet for Healthcare Providers: https://www.woods-mathews.com/ This test is not yet approved or cleared by the [place] FDA and  has been authorized for detection and/or diagnosis of SARS-CoV-2 by FDA under an Emergency Use Authorization (EUA). This EUA will remain  in effect (meaning this test can be used) for the duration of the COVID-19 declaration under Section 56 4(b)(1) of the Act, 21 U.S.C. section 360bbb-3(b)(1), unless the authorization is terminated or revoked sooner. Performed at [facility], [place] [address], [place], [place] [address]          Radiology Studies: CT Angio Chest PE W and/or Wo Contrast  Result Date: 11/12/2019 CLINICAL DATA:  PE suspected, low/intermediate prob, positive D-dimer Hypoxia, shortness of breath, positive D-dimer. EXAM: CT ANGIOGRAPHY CHEST WITH CONTRAST TECHNIQUE: Multidetector CT imaging of the chest was performed using the standard protocol  during bolus administration of intravenous contrast. Multiplanar CT image reconstructions and MIPs were obtained to evaluate the vascular anatomy. CONTRAST:  41mL OMNIPAQUE IOHEXOL 350 MG/ML SOLN COMPARISON:  Radiograph earlier this day. FINDINGS: Cardiovascular: Positive acute PE with nonocclusive filling defects involving the distal main right pulmonary artery. Subsegmental filling defects in the right upper lobe, with lobar filling defects in the right middle and lower lobe. Additional segmental and subsegmental lower lobe emboli which are partially occlusive. Subsegmental emboli in the left upper and lower lobes. Thromboembolic burden is small to moderate. Straightening of the  intraventricular septum with RV to LV ratio of 1.1. Mild contrast refluxing into the hepatic veins and IVC. Mild multi chamber cardiomegaly. Atherosclerosis of the thoracic aorta. No aortic dissection. Mediastinum/Nodes: No adenopathy. No esophageal wall thickening. No thyroid nodule. Lungs/Pleura: Breathing motion artifact partially obscures basilar evaluation. No pulmonary infarct. Linear opacity in the superior segment of the left lower lobe and anterior left upper lobe may represent atelectasis or scarring. There is a 6 x 4 mm nodule in the right lower lobe (mean 5 mm), series 7, image 52. 4 mm perifissural nodule in the right lung same image. No pulmonary edema. No pleural fluid. Upper Abdomen: Contrast refluxes into the hepatic veins and IVC. No other acute findings in the upper abdomen. Musculoskeletal: Multilevel degenerative change in the thoracic spine. There are no acute or suspicious osseous abnormalities. Review of the MIP images confirms the above findings. IMPRESSION: 1. Positive acute PE. Small-moderate clot burden primarily in the right lung, filling defects in the distal main pulmonary artery extending into lobar and subsegmental branches. Additional subsegmental pulmonary emboli in the left upper and lower lobes. Evidence of right heart strain with RV to LV ratio of 1.1. 2. Two pulmonary nodules in the right lung, largest measuring 5 mm. No follow-up needed if patient is low-risk (and has no known or suspected primary neoplasm). Non-contrast chest CT can be considered in 12 months if patient is high-risk. This recommendation follows the consensus statement: Guidelines for Management of Incidental Pulmonary Nodules Detected on CT Images: From the Fleischner Society 2017; Radiology 2017; 284:228-243. Aortic Atherosclerosis (ICD10-I70.0). Critical Value/emergent results were called by telephone at the time of interpretation on 11/12/2019 at 10:58 pm to Dr Sedonia Small, who verbally acknowledged these  results. Electronically Signed   By: Keith Rake M.D.   On: 11/12/2019 22:59   DG Chest Portable 1 View  Result Date: 11/12/2019 CLINICAL DATA:  Hypoxia. EXAM: PORTABLE CHEST 1 VIEW COMPARISON:  08/14/2018. FINDINGS: Again noted is borderline cardiomegaly. The thoracic aorta is tortuous. There are aortic calcifications. There is no pneumothorax. No large pleural effusion. There is some atelectasis versus scarring at the lung bases. IMPRESSION: 1. Borderline cardiomegaly.  Bibasilar atelectasis versus scarring. 2. Aortic atherosclerosis. Electronically Signed   By: Constance Holster M.D.   On: 11/12/2019 16:12   ECHOCARDIOGRAM COMPLETE  Result Date: 11/13/2019   ECHOCARDIOGRAM REPORT   Patient Name:   CURLIE DELMUNDO Date of Exam: 11/13/2019 Medical Rec #:  [id number]       Height:       63.0 in Accession #:    [id number]      Weight:       189.8 lb Date of Birth:  1929-12-03      BSA:          1.89 m Patient Age:    54 years        BP:  137/73 mmHg Patient Gender: F               HR:           81 bpm. Exam Location:  Inpatient Procedure: 2D Echo Indications:    pulmonary embolus 415.19  History:        Patient has no prior history of Echocardiogram examinations.                 Arrythmias:Atrial Fibrillation; Risk Factors:Hypertension.  Sonographer:    Johny Chess Referring Phys: [place]  1. Left ventricular ejection fraction, by visual estimation, is 60 to 65%. The left ventricle has normal function. There is borderline left ventricular hypertrophy.  2. Left ventricular diastolic parameters are indeterminate.  3. The left ventricle has no regional wall motion abnormalities.  4. Global right ventricle has normal systolic function.The right ventricular size is mildly enlarged. No increase in right ventricular wall thickness.  5. Left atrial size was normal.  6. Right atrial size was normal.  7. The mitral valve is grossly normal. Mild mitral valve regurgitation.   8. The tricuspid valve is grossly normal.  9. The aortic valve is tricuspid. Aortic valve regurgitation is not visualized. 10. The pulmonic valve was grossly normal. Pulmonic valve regurgitation is trivial. 11. Mildly elevated pulmonary artery systolic pressure. 12. The tricuspid regurgitant velocity is 2.80 m/s, and with an assumed right atrial pressure of 8 mmHg, the estimated right ventricular systolic pressure is mildly elevated at 39.4 mmHg. 13. The inferior vena cava is dilated in size with >50% respiratory variability, suggesting right atrial pressure of 8 mmHg. 14. Evidence of atrial level shunting detected by color flow Doppler. FINDINGS  Left Ventricle: Left ventricular ejection fraction, by visual estimation, is 60 to 65%. The left ventricle has normal function. The left ventricle has no regional wall motion abnormalities. The left ventricular internal cavity size was the left ventricle is normal in size. There is borderline left ventricular hypertrophy. Left ventricular diastolic parameters are indeterminate. Right Ventricle: The right ventricular size is mildly enlarged. No increase in right ventricular wall thickness. Global RV systolic function is has normal systolic function. The tricuspid regurgitant velocity is 2.80 m/s, and with an assumed right atrial  pressure of 8 mmHg, the estimated right ventricular systolic pressure is mildly elevated at 39.4 mmHg. Left Atrium: Left atrial size was normal in size. Right Atrium: Right atrial size was normal in size Pericardium: There is no evidence of pericardial effusion. Mitral Valve: The mitral valve is grossly normal. Mild mitral valve regurgitation. Tricuspid Valve: The tricuspid valve is grossly normal. Tricuspid valve regurgitation moderate-severe. Aortic Valve: The aortic valve is tricuspid. Aortic valve regurgitation is not visualized. Mild aortic valve annular calcification. Pulmonic Valve: The pulmonic valve was grossly normal. Pulmonic valve  regurgitation is trivial. Pulmonic regurgitation is trivial. Aorta: The aortic root is normal in size and structure. Venous: The inferior vena cava is dilated in size with greater than 50% respiratory variability, suggesting right atrial pressure of 8 mmHg. IAS/Shunts: Evidence of atrial level shunting detected by color flow Doppler.  LEFT VENTRICLE PLAX 2D LVIDd:         3.60 cm LV PW:         1.00 cm LV IVS:        1.00 cm LVOT diam:     2.10 cm LVOT Area:     3.46 cm  LEFT ATRIUM  Index       RIGHT ATRIUM           Index LA Vol (A2C):   50.7 ml 26.81 ml/m RA Area:     16.60 cm LA Vol (A4C):   62.1 ml 32.84 ml/m RA Volume:   42.00 ml  22.21 ml/m LA Biplane Vol: 60.5 ml 31.99 ml/m   AORTA Ao Root diam: 3.10 cm TRICUSPID VALVE TR Peak grad:   31.4 mmHg TR Vmax:        280.00 cm/s  SHUNTS Systemic Diam: 2.10 cm  Rozann Lesches MD Electronically signed by Rozann Lesches MD Signature Date/Time: 11/13/2019/2:53:05 PM    Final         Scheduled Meds: . latanoprost  1 drop Both Eyes QHS   Continuous Infusions: . sodium chloride 75 mL/hr at 11/13/19 0258  . heparin 1,100 Units/hr (11/13/19 0827)     LOS: 1 day    Time spent: [address] DO 11/13/2019, 3:02 PM

## 2019-11-13 NOTE — Procedures (Signed)
Patient refuses echocardiogram at this time.

## 2019-11-13 NOTE — Progress Notes (Signed)
[place] for Heparin Indication: pulmonary embolus  Allergies  Allergen Reactions  . Codeine Nausea And Vomiting    Patient Measurements: Height: 5\' 3"  (160 cm) Weight: 189 lb 12.8 oz (86.1 kg) IBW/kg (Calculated) : 52.4 Heparin Dosing Weight: 72 kg  Vital Signs: Temp: 98.5 F (36.9 C) (01/01 0559) Temp Source: Oral (01/01 0559) BP: 135/95 (01/01 0700) Pulse Rate: 85 (01/01 0700)  Labs: Recent Labs    11/12/19 1606 11/12/19 1757 11/13/19 0740 11/13/19 1630  HGB 14.3  --  13.9  --   HCT 42.7  --  39.5  --   PLT 234  --  237  --   HEPARINUNFRC  --   --  0.55 0.36  CREATININE 0.95  --  0.87  --   TROPONINIHS 25* 28*  --   --     Estimated Creatinine Clearance: 47.4 mL/min (by C-G formula based on SCr of 0.87 mg/dL).  Assessment: 86 y.o. female admitted for CT confirmed, bilateral PE with heart strain. No anticoagulation PTA. Heparin level remains at goal. No bleeding noted.   Goal of Therapy:  Heparin level 0.3-0.7 units/ml Monitor platelets by anticoagulation protocol: Yes   Plan:  Continue heparin gtt 1100 units/hr Daily heparin level and CBC  Salome Arnt, PharmD, BCPS Clinical Pharmacist Please see AMION for all pharmacy numbers 11/13/2019 5:04 PM

## 2019-11-13 NOTE — H&P (Signed)
History and Physical   [name] [id number] DOB: 21-Oct-1930 DOA: 11/12/2019  Referring MD/NP/PA: Dr. Sedonia Small  PCP: [place], [name], MD   Patient coming from: Home  Chief Complaint: Shortness of breath  HPI: Annette [name] is a 86 y.o. female with medical history significant of GERD, hypertension, primary hyperparathyroidism, chronic hyponatremia, anxiety disorder who was brought in from home with shortness of breath and weakness for about a month.  She has been communicating and being seen by her primary care physician.  Her last encounter was 2 days ago.  She has been having uncontrolled blood pressure that was being addressed by her PCP.  Shortness of breath has persisted and patient was brought to the ER today.  She was found to be tachypneic.  Evaluation showed bilateral pulmonary embolism.  There is also evidence of right heart strain.  CCM originally consulted but currently patient not deemed to be a CCM candidate.  She is on 6 L of oxygen best otherwise hemodynamically stable.  She is being admitted to the hospital for further work-up.  COVID-19 screen is currently pending..  ED Course: Temperature is 9078 blood pressure 115/67 pulse 91 respiratory rate of 29 oxygen sat 91% on room air currently 100% on 6 L.  CBC entirely within normal sodium 123 potassium 4.2 chloride 88 CO2 25 BUN 16 creatinine 0.95 calcium 10.9.  BNP 489 troponin XX 5 and then 28 lactic acid 2.0.  COVID-19 screen is negative.  X-ray showed cardiomegaly with bibasilar atelectasis.  CT angiogram of the chest showed acute PE with small moderate clot burden probably in the right lung filling defects.  Also involving the distal main pulmonary artery.  Also 2 pulmonary nodules.  Initiated on heparin drip and being admitted to the hospital.  Review of Systems: As per HPI otherwise 10 point review of systems negative.    Past Medical History:  Diagnosis Date   GERD 10/03/2007   HYPERTENSION 10/03/2007    OSTEOPENIA 02/03/2008   Primary hyperparathyroidism ([place]) 10/03/2007    Past Surgical History:  Procedure Laterality Date   ABDOMINAL HYSTERECTOMY       reports that she quit smoking about 41 years ago. She has never used smokeless tobacco. She reports that she does not drink alcohol or use drugs.  Allergies  Allergen Reactions   Codeine Nausea And Vomiting    No family history on file.   Prior to Admission medications   Medication Sig Start Date End Date Taking? Authorizing Provider  acetaminophen (TYLENOL) 500 MG tablet Take 500 mg by mouth every 6 (six) hours as needed (arthritis pain).    [provider]  amLODipine (NORVASC) 2.5 MG tablet Take 2 tablets (5 mg total) by mouth daily. TAKE 1 TABLET EVERY DAY WITH BREAKFAST 11/11/19   [place], [name], MD  benazepril (LOTENSIN) 40 MG tablet Take 1 tablet (40 mg total) by mouth daily. 07/10/19   [place], [name], MD  furosemide (LASIX) 40 MG tablet Take 1 tablet 3 times weekly 07/06/19   [place], [name], MD  ibuprofen (ADVIL,MOTRIN) 200 MG tablet Take 2 tablets (400 mg total) by mouth daily as needed for moderate pain. 01/19/15   Marletta Lor, MD  latanoprost (XALATAN) 0.005 % ophthalmic solution INSTILL 1 DROP INTO BOTH EYES AT BEDTIME Patient taking differently: Place 1 drop into both eyes at bedtime.  10/29/17   Marletta Lor, MD  LORazepam (ATIVAN) 0.5 MG tablet Take 1 tablet (0.5 mg total) by mouth daily as  needed for anxiety. for anxiety 08/22/18   [place], [name], MD  meclizine (ANTIVERT) 25 MG tablet Take 1 tablet (25 mg total) by mouth 4 (four) times daily as needed. 12/27/14   Marletta Lor, MD  omeprazole (PRILOSEC) 20 MG capsule Take 1 capsule (20 mg total) by mouth daily. 10/27/18   [place], [name], MD  VITAMIN D PO Take 2 mg by mouth daily.    [provider]    Physical Exam: Vitals:   11/12/19 2323 11/12/19 2326 11/12/19 2330 11/13/19 0000  BP: (!) 151/67  (!) 145/82 127/90    Pulse: 88  86 88  Resp: (!) 22  (!) 22 20  Temp:      TempSrc:      SpO2: 97%  97% 95%  Weight:  84.8 kg        Constitutional: Anxious in obvious respiratory distress Vitals:   11/12/19 2323 11/12/19 2326 11/12/19 2330 11/13/19 0000  BP: (!) 151/67  (!) 145/82 127/90  Pulse: 88  86 88  Resp: (!) 22  (!) 22 20  Temp:      TempSrc:      SpO2: 97%  97% 95%  Weight:  84.8 kg     Eyes: PERRL, lids and conjunctivae normal ENMT: Mucous membranes are moist. Posterior pharynx clear of any exudate or lesions.Normal dentition.  Neck: normal, supple, no masses, no thyromegaly Respiratory: Decreased air entry bilaterally with coarse breath sounds and increased respiratory effort. No accessory muscle use.  Cardiovascular: Irregularly irregular with tachycardia no murmurs / rubs / gallops. No extremity edema. 2+ pedal pulses. No carotid bruits.  Abdomen: no tenderness, no masses palpated. No hepatosplenomegaly. Bowel sounds positive.  Musculoskeletal: no clubbing / cyanosis. No joint deformity upper and lower extremities. Good ROM, no contractures. Normal muscle tone.  Skin: no rashes, lesions, ulcers. No induration Neurologic: CN 2-12 grossly intact. Sensation intact, DTR normal. Strength 5/5 in all 4.  Psychiatric: Normal judgment and insight. Alert and oriented x 3. Normal mood.     Labs on Admission: I have personally reviewed following labs and imaging studies  CBC: Recent Labs  Lab 11/12/19 1606  WBC 6.6  NEUTROABS 4.8  HGB 14.3  HCT 42.7  MCV 87.5  PLT [id number]   Basic Metabolic Panel: Recent Labs  Lab 11/12/19 1606  NA 123*  K 4.2  CL 88*  CO2 25  GLUCOSE 107*  BUN 16  CREATININE 0.95  CALCIUM 10.9*  MG 1.8   GFR: Estimated Creatinine Clearance: 43.1 mL/min (by C-G formula based on SCr of 0.95 mg/dL). Liver Function Tests: Recent Labs  Lab 11/12/19 1606  AST 37  ALT 37  ALKPHOS 77  BILITOT 0.9  PROT 6.3*  ALBUMIN 3.9   No results for input(s): LIPASE,  AMYLASE in the last 168 hours. No results for input(s): AMMONIA in the last 168 hours. Coagulation Profile: No results for input(s): INR, PROTIME in the last 168 hours. Cardiac Enzymes: No results for input(s): CKTOTAL, CKMB, CKMBINDEX, TROPONINI in the last 168 hours. BNP (last 3 results) No results for input(s): PROBNP in the last 8760 hours. HbA1C: No results for input(s): HGBA1C in the last 72 hours. CBG: No results for input(s): GLUCAP in the last 168 hours. Lipid Profile: No results for input(s): CHOL, HDL, LDLCALC, TRIG, CHOLHDL, LDLDIRECT in the last 72 hours. Thyroid Function Tests: No results for input(s): TSH, T4TOTAL, FREET4, T3FREE, THYROIDAB in the last 72 hours. Anemia Panel: No results for input(s): VITAMINB12, FOLATE,  FERRITIN, TIBC, IRON, RETICCTPCT in the last 72 hours. Urine analysis:    Component Value Date/Time   COLORURINE YELLOW 08/14/2018 1723   APPEARANCEUR CLEAR 08/14/2018 1723   LABSPEC 1.013 08/14/2018 1723   PHURINE 5.0 08/14/2018 1723   GLUCOSEU NEGATIVE 08/14/2018 1723   HGBUR NEGATIVE 08/14/2018 1723   BILIRUBINUR NEGATIVE 08/14/2018 1723   KETONESUR NEGATIVE 08/14/2018 1723   PROTEINUR NEGATIVE 08/14/2018 1723   NITRITE NEGATIVE 08/14/2018 1723   LEUKOCYTESUR NEGATIVE 08/14/2018 1723   Sepsis Labs: @LABRCNTIP (procalcitonin:4,lacticidven:4) ) Recent Results (from the past 240 hour(s))  SARS CORONAVIRUS 2 (TAT 6-24 HRS) Nasopharyngeal Nasopharyngeal Swab     Status: None   Collection Time: 11/12/19  5:57 PM   Specimen: Nasopharyngeal Swab  Result Value Ref Range Status   SARS Coronavirus 2 NEGATIVE NEGATIVE Final    Comment: (NOTE) SARS-CoV-2 target nucleic acids are NOT DETECTED. The SARS-CoV-2 RNA is generally detectable in upper and lower respiratory specimens during the acute phase of infection. Negative results do not preclude SARS-CoV-2 infection, do not rule out co-infections with other pathogens, and should not be used as  the sole basis for treatment or other patient management decisions. Negative results must be combined with clinical observations, patient history, and epidemiological information. The expected result is Negative. Fact Sheet for Patients: SugarRoll.be Fact Sheet for Healthcare Providers: https://www.woods-mathews.com/ This test is not yet approved or cleared by the [place] FDA and  has been authorized for detection and/or diagnosis of SARS-CoV-2 by FDA under an Emergency Use Authorization (EUA). This EUA will remain  in effect (meaning this test can be used) for the duration of the COVID-19 declaration under Section 56 4(b)(1) of the Act, 21 U.S.C. section 360bbb-3(b)(1), unless the authorization is terminated or revoked sooner. Performed at [facility], [place] [address], [place], [place] [address]      Radiological Exams on Admission: CT Angio Chest PE W and/or Wo Contrast  Result Date: 11/12/2019 CLINICAL DATA:  PE suspected, low/intermediate prob, positive D-dimer Hypoxia, shortness of breath, positive D-dimer. EXAM: CT ANGIOGRAPHY CHEST WITH CONTRAST TECHNIQUE: Multidetector CT imaging of the chest was performed using the standard protocol during bolus administration of intravenous contrast. Multiplanar CT image reconstructions and MIPs were obtained to evaluate the vascular anatomy. CONTRAST:  75mL OMNIPAQUE IOHEXOL 350 MG/ML SOLN COMPARISON:  Radiograph earlier this day. FINDINGS: Cardiovascular: Positive acute PE with nonocclusive filling defects involving the distal main right pulmonary artery. Subsegmental filling defects in the right upper lobe, with lobar filling defects in the right middle and lower lobe. Additional segmental and subsegmental lower lobe emboli which are partially occlusive. Subsegmental emboli in the left upper and lower lobes. Thromboembolic burden is small to moderate. Straightening of the intraventricular  septum with RV to LV ratio of 1.1. Mild contrast refluxing into the hepatic veins and IVC. Mild multi chamber cardiomegaly. Atherosclerosis of the thoracic aorta. No aortic dissection. Mediastinum/Nodes: No adenopathy. No esophageal wall thickening. No thyroid nodule. Lungs/Pleura: Breathing motion artifact partially obscures basilar evaluation. No pulmonary infarct. Linear opacity in the superior segment of the left lower lobe and anterior left upper lobe may represent atelectasis or scarring. There is a 6 x 4 mm nodule in the right lower lobe (mean 5 mm), series 7, image 52. 4 mm perifissural nodule in the right lung same image. No pulmonary edema. No pleural fluid. Upper Abdomen: Contrast refluxes into the hepatic veins and IVC. No other acute findings in the upper abdomen. Musculoskeletal: Multilevel degenerative change in the thoracic  spine. There are no acute or suspicious osseous abnormalities. Review of the MIP images confirms the above findings. IMPRESSION: 1. Positive acute PE. Small-moderate clot burden primarily in the right lung, filling defects in the distal main pulmonary artery extending into lobar and subsegmental branches. Additional subsegmental pulmonary emboli in the left upper and lower lobes. Evidence of right heart strain with RV to LV ratio of 1.1. 2. Two pulmonary nodules in the right lung, largest measuring 5 mm. No follow-up needed if patient is low-risk (and has no known or suspected primary neoplasm). Non-contrast chest CT can be considered in 12 months if patient is high-risk. This recommendation follows the consensus statement: Guidelines for Management of Incidental Pulmonary Nodules Detected on CT Images: From the Fleischner Society 2017; Radiology 2017; 284:228-243. Aortic Atherosclerosis (ICD10-I70.0). Critical Value/emergent results were called by telephone at the time of interpretation on 11/12/2019 at 10:58 pm to Dr Sedonia Small, who verbally acknowledged these results. Electronically  Signed   By: Keith Rake M.D.   On: 11/12/2019 22:59   DG Chest Portable 1 View  Result Date: 11/12/2019 CLINICAL DATA:  Hypoxia. EXAM: PORTABLE CHEST 1 VIEW COMPARISON:  08/14/2018. FINDINGS: Again noted is borderline cardiomegaly. The thoracic aorta is tortuous. There are aortic calcifications. There is no pneumothorax. No large pleural effusion. There is some atelectasis versus scarring at the lung bases. IMPRESSION: 1. Borderline cardiomegaly.  Bibasilar atelectasis versus scarring. 2. Aortic atherosclerosis. Electronically Signed   By: Constance Holster M.D.   On: 11/12/2019 16:12    EKG: Independently reviewed.  It shows atrial flutter with a rate of 82.  Mild nonspecific ST changes.  Assessment/Plan Principal Problem:   Pulmonary embolism and infarction [facility]) Active Problems:   Primary hyperparathyroidism ([place])   Essential hypertension   GERD   Anxiety disorder, unspecified   Hyponatremia     #1 bilateral pulmonary embolism: Appears to be submassive especially in the right lung.  Patient will be admitted and initiated on heparin drip.  Close monitoring.  Oxygen and pain management.  Pulmonary following.  Will get echocardiogram in the morning.  Once patient is hemodynamically stable transition to oral anticoagulants prior to discharge based on her safety profile with her history of frequent falls.  #2 essential hypertension: Confirm and resume home regimen.  #3 hyponatremia: Patient has chronic hyponatremia.  She probably is dehydrated also.  Use some saline hydration and monitor sodium level.  #4 anxiety disorder: Continue home regimen.  #5 GERD: Continue PPIs   DVT prophylaxis: Heparin drip Code Status: Full code Family Communication: No family at bedside Disposition Plan: To be determined Consults called: Pulmonary critical care Admission status: Inpatient  Severity of Illness: The appropriate patient status for this patient is INPATIENT. Inpatient status is  judged to be reasonable and necessary in order to provide the required intensity of service to ensure the patient's safety. The patient's presenting symptoms, physical exam findings, and initial radiographic and laboratory data in the context of their chronic comorbidities is felt to place them at high risk for further clinical deterioration. Furthermore, it is not anticipated that the patient will be medically stable for discharge from the hospital within 2 midnights of admission. The following factors support the patient status of inpatient.   " The patient's presenting symptoms include shortness of breath. " The worrisome physical exam findings include bilateral crackles. " The initial radiographic and laboratory data are worrisome because of bilateral PE on CT. " The chronic co-morbidities include hypertension.   * I certify that  at the point of admission it is my clinical judgment that the patient will require inpatient hospital care spanning beyond 2 midnights from the point of admission due to high intensity of service, high risk for further deterioration and high frequency of surveillance required.Barbette Merino MD [facility] Pager [phone]  If 7PM-7AM, please contact night-coverage [email or web address] Password [email or web address]  11/13/2019, 12:08 AM

## 2019-11-13 NOTE — Progress Notes (Signed)
  Echocardiogram 2D Echocardiogram has been performed.  Johny Chess 11/13/2019, 2:42 PM

## 2019-11-13 NOTE — Consult Note (Addendum)
NAME:  Annette [name], MRN:  [id number], DOB:  1929/11/19, LOS: 1 ADMISSION DATE:  11/12/2019, CONSULTATION DATE:  11/12/18 CHIEF COMPLAINT:  Pulmonary emoblus  Brief History   This is a 86 yo female with medical history significant for HTN, GERD,glaucoma, and Arthritis who presented for SOB found to have PE    History of present illness   This is a 86 yo female with medical history significant for HTN, GERD,glaucoma, and Arthritis who presented for SOB that had been getting progressively worse over the last few months. She notes no recent travel or prolonged inactivity. No previous blood clots. No surgeries. No malignancies. She does note a cough that is more prominent in the mornings. She does have a smoking history but she quit in 1976. Never been diagnosed with COPD. Does have increased DOE. Mild orthopnea. Does have swelling in legs for which she takes a fluid pill but this has not been better or worse than usual. In ED workup pertinent for small-moderate clot burden in the right lung.    ED COURSE:Patient admitted and noted to have elevated troponin of 25. LA of 2.0. BNP of 489.6. NA of 123. CTA notable for PE as seen below  Past Medical History  GERD HTN Arthritis Glaucoma  Significant Hospital Events   Patient hospitalized for workup and management of PE  Consults:  PCCM  Procedures:  None  Significant Diagnostic Tests:  CTA as seen below:  1. Positive acute PE. Small-moderate clot burden primarily in the right lung, filling defects in the distal main pulmonary artery extending into lobar and subsegmental branches. Additional subsegmental pulmonary emboli in the left upper and lower lobes. Evidence of right heart strain with RV to LV ratio of 1.1. 2. Two pulmonary nodules in the right lung, largest measuring 5 mm. No follow-up needed if patient is low-risk (and has no known or suspected primary neoplasm). Non-contrast chest CT can be considered in 12 months if  patient is high-risk. This recommendation follows the consensus statement: Guidelines for Management of Incidental Pulmonary Nodules Detected on CT Images: From the Fleischner Society 2017; Radiology 2017; 284:228-243.  Micro Data:  COVID negative  Antimicrobials:  NA  Interim history/subjective:  NA Objective   Blood pressure 127/90, pulse 88, temperature 97.8 F (36.6 C), temperature source Oral, resp. rate 20, weight 84.8 kg, SpO2 95 %.       No intake or output data in the 24 hours ending 11/13/19 0052 Filed Weights   11/12/19 2326  Weight: 84.8 kg    Examination: General: Patient alert and talkative HENT: Moist mucous membranes Lungs: Wheezes noted scattered throughout Cardiovascular: RRR. No rube or murmurs noted Abdomen: Soft NT ND Extremities: No gross deformities appreciated Neuro: Intact able to follow commands throghout GU: Deferred  Resolved Hospital Problem list   NA  Assessment & Plan:  This is a 86 yo female with history as noted above who presents for SOB found to have PE.ADmitted to hospitalitis service for further management. PCCM consulted as patient elderly and concern for elevated right sided pressure with flattening of interventricular septum  PE-Not massive. Could be submassive. Some soft signs of right sided strain. Also with some oxygen requirement. Was sating 96% on 2 liters on my exam. Not a lytic candidate at this time given her age and relatively stable clinical status. If clinically seems to worsen would consider EKOS -TTE in AM -Heparin drip   Hyponatremia-Likely secondary to volume overload. In setting of elevated BNP and GGO with  interlobular septal thickening on CT -Would consider trial of diuretics   Hypoxic respiratory failure-Likely secondary to vouylme overload and PE. Patient with some wheezing on exam. Could have element of COPD as her husband was long time smoker and she did smoke prior to 1976. Patient with daily morning  cough -Would attain PFT's on outpatient basis. Do not think this is clear exacerbation at this time As patient with normal baseline cough and symptoms. -Could consider PRN duonebs -Diurese as tolerated -Heparin drip   Pulmonary will sign off for now. Please feel free to re engage if questions should arise or patients clinical status worsen.  Newell Coral DO Internal Medicine/Pediatrics Pulmonary and Critical Care Fellow PGY-6      Labs   CBC: Recent Labs  Lab 11/12/19 1606  WBC 6.6  NEUTROABS 4.8  HGB 14.3  HCT 42.7  MCV 87.5  PLT Q000111Q    Basic Metabolic Panel: Recent Labs  Lab 11/12/19 1606  NA 123*  K 4.2  CL 88*  CO2 25  GLUCOSE 107*  BUN 16  CREATININE 0.95  CALCIUM 10.9*  MG 1.8   GFR: Estimated Creatinine Clearance: 43.1 mL/min (by C-G formula based on SCr of 0.95 mg/dL). Recent Labs  Lab 11/12/19 1606  WBC 6.6  LATICACIDVEN 2.0*    Liver Function Tests: Recent Labs  Lab 11/12/19 1606  AST 37  ALT 37  ALKPHOS 77  BILITOT 0.9  PROT 6.3*  ALBUMIN 3.9   No results for input(s): LIPASE, AMYLASE in the last 168 hours. No results for input(s): AMMONIA in the last 168 hours.  ABG No results found for: PHART, PCO2ART, PO2ART, HCO3, TCO2, ACIDBASEDEF, O2SAT   Coagulation Profile: No results for input(s): INR, PROTIME in the last 168 hours.  Cardiac Enzymes: No results for input(s): CKTOTAL, CKMB, CKMBINDEX, TROPONINI in the last 168 hours.  HbA1C: No results found for: HGBA1C  CBG: No results for input(s): GLUCAP in the last 168 hours.  Review of Systems:   Pertinent positives and negatives per HPI  Past Medical History  She,  has a past medical history of GERD (10/03/2007), HYPERTENSION (10/03/2007), OSTEOPENIA (02/03/2008), and Primary hyperparathyroidism ([place]) (10/03/2007).   Surgical History    Past Surgical History:  Procedure Laterality Date  . ABDOMINAL HYSTERECTOMY       Social History   reports that she quit  smoking about 41 years ago. She has never used smokeless tobacco. She reports that she does not drink alcohol or use drugs.   Family History   Her family history is not on file.   Allergies Allergies  Allergen Reactions  . Codeine Nausea And Vomiting     Home Medications  Prior to Admission medications   Medication Sig Start Date End Date Taking? Authorizing Provider  acetaminophen (TYLENOL) 500 MG tablet Take 500 mg by mouth every 6 (six) hours as needed (arthritis pain).   Yes [provider]  amLODipine (NORVASC) 2.5 MG tablet Take 2 tablets (5 mg total) by mouth daily. TAKE 1 TABLET EVERY DAY WITH BREAKFAST 11/11/19  Yes [place], [name], MD  benazepril (LOTENSIN) 40 MG tablet Take 1 tablet (40 mg total) by mouth daily. 07/10/19  Yes [place], [name], MD  furosemide (LASIX) 40 MG tablet Take 1 tablet 3 times weekly 07/06/19  Yes [place], [name], MD  ibuprofen (ADVIL,MOTRIN) 200 MG tablet Take 2 tablets (400 mg total) by mouth daily as needed for moderate pain. 01/19/15  Yes Marletta Lor, MD  latanoprost (  XALATAN) 0.005 % ophthalmic solution INSTILL 1 DROP INTO BOTH EYES AT BEDTIME Patient taking differently: Place 1 drop into both eyes at bedtime.  10/29/17  Yes Marletta Lor, MD  LORazepam (ATIVAN) 0.5 MG tablet Take 1 tablet (0.5 mg total) by mouth daily as needed for anxiety. for anxiety 08/22/18  Yes [place], [name], MD  meclizine (ANTIVERT) 25 MG tablet Take 1 tablet (25 mg total) by mouth 4 (four) times daily as needed. Patient taking differently: Take 25 mg by mouth 4 (four) times daily as needed for dizziness.  12/27/14  Yes Marletta Lor, MD  omeprazole (PRILOSEC) 20 MG capsule Take 1 capsule (20 mg total) by mouth daily. 10/27/18  Yes [place], [name], MD  VITAMIN D PO Take 2,000 Units by mouth daily.    Yes [provider]     Critical care time: 60 minutes

## 2019-11-14 LAB — BASIC METABOLIC PANEL
Anion gap: 7 (ref 5–15)
BUN: 15 mg/dL (ref 8–23)
CO2: 27 mmol/L (ref 22–32)
Calcium: 10.7 mg/dL — ABNORMAL HIGH (ref 8.9–10.3)
Chloride: 94 mmol/L — ABNORMAL LOW (ref 98–111)
Creatinine, Ser: 1.1 mg/dL — ABNORMAL HIGH (ref 0.44–1.00)
GFR calc Af Amer: 52 mL/min — ABNORMAL LOW (ref >60)
GFR calc non Af Amer: 45 mL/min — ABNORMAL LOW (ref >60)
Glucose, Bld: 106 mg/dL — ABNORMAL HIGH (ref 70–99)
Potassium: 4.4 mmol/L (ref 3.5–5.1)
Sodium: 128 mmol/L — ABNORMAL LOW (ref 135–145)

## 2019-11-14 LAB — CBC
HCT: 36.7 % (ref 36.0–46.0)
Hemoglobin: 12.1 g/dL (ref 12.0–15.0)
MCH: 29.3 pg (ref 26.0–34.0)
MCHC: 33 g/dL (ref 30.0–36.0)
MCV: 88.9 fL (ref 80.0–100.0)
Platelets: 220 10*3/uL (ref 150–400)
RBC: 4.13 MIL/uL (ref 3.87–5.11)
RDW: 13.5 % (ref 11.5–15.5)
WBC: 8.4 10*3/uL (ref 4.0–10.5)
nRBC: 0 % (ref 0.0–0.2)

## 2019-11-14 LAB — HEPARIN LEVEL (UNFRACTIONATED)
Heparin Unfractionated: 0.25 IU/mL — ABNORMAL LOW (ref 0.30–0.70)
Heparin Unfractionated: 0.33 IU/mL (ref 0.30–0.70)

## 2019-11-14 MED ORDER — AMLODIPINE BESYLATE 5 MG PO TABS: 5.0000 mg | ORAL_TABLET | Freq: Every day | ORAL | 0 refills | Status: DC

## 2019-11-14 MED ORDER — APIXABAN 2.5 MG PO TABS: 2.5000 mg | ORAL_TABLET | Freq: Two times a day (BID) | ORAL | 0 refills | Status: DC

## 2019-11-14 NOTE — Progress Notes (Signed)
PROGRESS NOTE    Annette [name]  [id number] DOB: 1930/09/17 DOA: 11/12/2019 PCP: [place], [name], MD  Outpatient Specialists:   Brief Narrative:  Admitted earlier today, see H&P 11/12/19: to ED w/ SOB and weakness, Dx bilateral PE (CT angiogram of the chest showed acute PE with small moderate clot burden probably in the right lung filling defects.  Also involving the distal main pulmonary artery.  Also 2 pulmonary nodules), started on heparin gtt 11/13/19 - pt initially refused Echo but after assurance that there was no radiation involved she is amenable to this test.   Today 11/14/19 - day 3 of heparin drip, PESI score class III, will continue heparin another 1-2 days and then she should be ok to go home, planning for ambulation w/ O2 monitoring tomorrow, PT saw her today recommends home health    Assessment & Plan:   Principal Problem:   Pulmonary embolism and infarction [facility]) Active Problems:   Primary hyperparathyroidism ([place])   Essential hypertension   GERD   Anxiety disorder, unspecified   Hyponatremia   Bilateral pulmonary embolism:  Appears to be submassive especially in the right lung.  Patient will be admitted and initiated on heparin drip.  Close monitoring.  Oxygen and pain management.  Once patient is hemodynamically stable transition to oral anticoagulants prior to discharge based on her safety profile with her history of frequent falls. I think will be ok to transition next day or two given high risk PE will leave her on heparin gtt another 1-2 days for total 3-5 days on the meds Eliquis has already been approved for outpatient.   Essential hypertension:  Amlodipine 2.5, benazepril 40   Hyponatremia:  Patient has chronic hyponatremia.  She probably is dehydrated also. Has been on saline, to monitor BMP again tomorrow   Anxiety disorder: Continue home regimen.  GERD: Continue PPIs      DVT prophylaxis: Heparin drip, planning to transition to  Eliquis eventually  Code Status: Full code Family Communication: No family at bedside Disposition Plan: To be determined Consults called: Pulmonary critical care Admission status: Inpatien   Procedures: Echocardiogram results pending   Antimicrobials: Anti-infectives (From admission, onward)   None        Subjective: Patient is feeling well, is a bit anxious this morning. She's been calling family a lot, per nurse report.   Objective: Vitals:   11/13/19 0700 11/13/19 2142 11/14/19 0642 11/14/19 0834  BP: (!) 135/95 120/89  130/83  Pulse: 85 81 87 74  Resp:    16  Temp:  98.1 F (36.7 C) 98.2 F (36.8 C)   TempSrc:  Oral Oral   SpO2: 94% 94%  93%  Weight:   83.6 kg   Height:        Intake/Output Summary (Last 24 hours) at 11/14/2019 1626 Last data filed at 11/14/2019 0828 Gross per 24 hour  Intake 720 ml  Output --  Net 720 ml   Filed Weights   11/12/19 2326 11/13/19 0128 11/14/19 0642  Weight: 84.8 kg 86.1 kg 83.6 kg    Examination:  General exam: Appears calm and comfortable  Respiratory system: Clear to auscultation. Respiratory effort normal. Cardiovascular system: S1 & S2 heard, RRR. No JVD, murmurs, rubs, gallops or clicks. No pedal edema. Gastrointestinal system: Abdomen is nondistended, soft and nontender. No organomegaly or masses felt. Normal bowel sounds heard. Central nervous system: Alert and oriented. No focal neurological deficits. Extremities: Symmetric 5 x 5 power. Skin: No rashes, lesions or  ulcers Psychiatry: Judgement and insight appear fair. Mood & affect appropriate.     Data Reviewed: I have personally reviewed following labs and imaging studies  CBC: Recent Labs  Lab 11/12/19 1606 11/13/19 0740 11/14/19 0555  WBC 6.6 6.8 8.4  NEUTROABS 4.8  --   --   HGB 14.3 13.9 12.1  HCT 42.7 39.5 36.7  MCV 87.5 84.4 88.9  PLT 234 237 [id number]   Basic Metabolic Panel: Recent Labs  Lab 11/12/19 1606 11/13/19 0740  NA 123* 125*  K 4.2  4.2  CL 88* 91*  CO2 25 25  GLUCOSE 107* 160*  BUN 16 14  CREATININE 0.95 0.87  CALCIUM 10.9* 10.8*  MG 1.8  --    GFR: Estimated Creatinine Clearance: 46.7 mL/min (by C-G formula based on SCr of 0.87 mg/dL). Liver Function Tests: Recent Labs  Lab 11/12/19 1606 11/13/19 0740  AST 37 27  ALT 37 34  ALKPHOS 77 72  BILITOT 0.9 0.9  PROT 6.3* 6.0*  ALBUMIN 3.9 3.6   No results for input(s): LIPASE, AMYLASE in the last 168 hours. No results for input(s): AMMONIA in the last 168 hours. Coagulation Profile: No results for input(s): INR, PROTIME in the last 168 hours. Cardiac Enzymes: No results for input(s): CKTOTAL, CKMB, CKMBINDEX, TROPONINI in the last 168 hours. BNP (last 3 results) No results for input(s): PROBNP in the last 8760 hours. HbA1C: No results for input(s): HGBA1C in the last 72 hours. CBG: No results for input(s): GLUCAP in the last 168 hours. Lipid Profile: No results for input(s): CHOL, HDL, LDLCALC, TRIG, CHOLHDL, LDLDIRECT in the last 72 hours. Thyroid Function Tests: No results for input(s): TSH, T4TOTAL, FREET4, T3FREE, THYROIDAB in the last 72 hours. Anemia Panel: No results for input(s): VITAMINB12, FOLATE, FERRITIN, TIBC, IRON, RETICCTPCT in the last 72 hours. Urine analysis:    Component Value Date/Time   COLORURINE YELLOW 08/14/2018 1723   APPEARANCEUR CLEAR 08/14/2018 1723   LABSPEC 1.013 08/14/2018 1723   PHURINE 5.0 08/14/2018 1723   GLUCOSEU NEGATIVE 08/14/2018 1723   HGBUR NEGATIVE 08/14/2018 1723   BILIRUBINUR NEGATIVE 08/14/2018 1723   KETONESUR NEGATIVE 08/14/2018 1723   PROTEINUR NEGATIVE 08/14/2018 1723   NITRITE NEGATIVE 08/14/2018 1723   LEUKOCYTESUR NEGATIVE 08/14/2018 1723   Sepsis Labs: @LABRCNTIP (procalcitonin:4,lacticidven:4)  ) Recent Results (from the past 240 hour(s))  SARS CORONAVIRUS 2 (TAT 6-24 HRS) Nasopharyngeal Nasopharyngeal Swab     Status: None   Collection Time: 11/12/19  5:57 PM   Specimen:  Nasopharyngeal Swab  Result Value Ref Range Status   SARS Coronavirus 2 NEGATIVE NEGATIVE Final    Comment: (NOTE) SARS-CoV-2 target nucleic acids are NOT DETECTED. The SARS-CoV-2 RNA is generally detectable in upper and lower respiratory specimens during the acute phase of infection. Negative results do not preclude SARS-CoV-2 infection, do not rule out co-infections with other pathogens, and should not be used as the sole basis for treatment or other patient management decisions. Negative results must be combined with clinical observations, patient history, and epidemiological information. The expected result is Negative. Fact Sheet for Patients: SugarRoll.be Fact Sheet for Healthcare Providers: https://www.woods-mathews.com/ This test is not yet approved or cleared by the [place] FDA and  has been authorized for detection and/or diagnosis of SARS-CoV-2 by FDA under an Emergency Use Authorization (EUA). This EUA will remain  in effect (meaning this test can be used) for the duration of the COVID-19 declaration under Section 56 4(b)(1) of the Act, 21 U.S.C. section 360bbb-3(b)(1), unless  the authorization is terminated or revoked sooner. Performed at [facility], [place] [address], [place], [place] [address]          Radiology Studies: CT Angio Chest PE W and/or Wo Contrast  Result Date: 11/12/2019 CLINICAL DATA:  PE suspected, low/intermediate prob, positive D-dimer Hypoxia, shortness of breath, positive D-dimer. EXAM: CT ANGIOGRAPHY CHEST WITH CONTRAST TECHNIQUE: Multidetector CT imaging of the chest was performed using the standard protocol during bolus administration of intravenous contrast. Multiplanar CT image reconstructions and MIPs were obtained to evaluate the vascular anatomy. CONTRAST:  56mL OMNIPAQUE IOHEXOL 350 MG/ML SOLN COMPARISON:  Radiograph earlier this day. FINDINGS: Cardiovascular: Positive acute PE with  nonocclusive filling defects involving the distal main right pulmonary artery. Subsegmental filling defects in the right upper lobe, with lobar filling defects in the right middle and lower lobe. Additional segmental and subsegmental lower lobe emboli which are partially occlusive. Subsegmental emboli in the left upper and lower lobes. Thromboembolic burden is small to moderate. Straightening of the intraventricular septum with RV to LV ratio of 1.1. Mild contrast refluxing into the hepatic veins and IVC. Mild multi chamber cardiomegaly. Atherosclerosis of the thoracic aorta. No aortic dissection. Mediastinum/Nodes: No adenopathy. No esophageal wall thickening. No thyroid nodule. Lungs/Pleura: Breathing motion artifact partially obscures basilar evaluation. No pulmonary infarct. Linear opacity in the superior segment of the left lower lobe and anterior left upper lobe may represent atelectasis or scarring. There is a 6 x 4 mm nodule in the right lower lobe (mean 5 mm), series 7, image 52. 4 mm perifissural nodule in the right lung same image. No pulmonary edema. No pleural fluid. Upper Abdomen: Contrast refluxes into the hepatic veins and IVC. No other acute findings in the upper abdomen. Musculoskeletal: Multilevel degenerative change in the thoracic spine. There are no acute or suspicious osseous abnormalities. Review of the MIP images confirms the above findings. IMPRESSION: 1. Positive acute PE. Small-moderate clot burden primarily in the right lung, filling defects in the distal main pulmonary artery extending into lobar and subsegmental branches. Additional subsegmental pulmonary emboli in the left upper and lower lobes. Evidence of right heart strain with RV to LV ratio of 1.1. 2. Two pulmonary nodules in the right lung, largest measuring 5 mm. No follow-up needed if patient is low-risk (and has no known or suspected primary neoplasm). Non-contrast chest CT can be considered in 12 months if patient is  high-risk. This recommendation follows the consensus statement: Guidelines for Management of Incidental Pulmonary Nodules Detected on CT Images: From the Fleischner Society 2017; Radiology 2017; 284:228-243. Aortic Atherosclerosis (ICD10-I70.0). Critical Value/emergent results were called by telephone at the time of interpretation on 11/12/2019 at 10:58 pm to Dr Sedonia Small, who verbally acknowledged these results. Electronically Signed   By: Keith Rake M.D.   On: 11/12/2019 22:59   ECHOCARDIOGRAM COMPLETE  Result Date: 11/13/2019   ECHOCARDIOGRAM REPORT   Patient Name:   Annette [name] Date of Exam: 11/13/2019 Medical Rec #:  [id number]       Height:       63.0 in Accession #:    [id number]      Weight:       189.8 lb Date of Birth:  [birth year]      BSA:          1.89 m Patient Age:    29 years        BP:           137/73 mmHg Patient Gender:  F               HR:           81 bpm. Exam Location:  Inpatient Procedure: 2D Echo Indications:    pulmonary embolus 415.19  History:        Patient has no prior history of Echocardiogram examinations.                 Arrythmias:Atrial Fibrillation; Risk Factors:Hypertension.  Sonographer:    Johny Chess Referring Phys: [place]  1. Left ventricular ejection fraction, by visual estimation, is 60 to 65%. The left ventricle has normal function. There is borderline left ventricular hypertrophy.  2. Left ventricular diastolic parameters are indeterminate.  3. The left ventricle has no regional wall motion abnormalities.  4. Global right ventricle has normal systolic function.The right ventricular size is mildly enlarged. No increase in right ventricular wall thickness.  5. Left atrial size was normal.  6. Right atrial size was normal.  7. The mitral valve is grossly normal. Mild mitral valve regurgitation.  8. The tricuspid valve is grossly normal.  9. The aortic valve is tricuspid. Aortic valve regurgitation is not visualized. 10. The pulmonic  valve was grossly normal. Pulmonic valve regurgitation is trivial. 11. Mildly elevated pulmonary artery systolic pressure. 12. The tricuspid regurgitant velocity is 2.80 m/s, and with an assumed right atrial pressure of 8 mmHg, the estimated right ventricular systolic pressure is mildly elevated at 39.4 mmHg. 13. The inferior vena cava is dilated in size with >50% respiratory variability, suggesting right atrial pressure of 8 mmHg. 14. Evidence of atrial level shunting detected by color flow Doppler. FINDINGS  Left Ventricle: Left ventricular ejection fraction, by visual estimation, is 60 to 65%. The left ventricle has normal function. The left ventricle has no regional wall motion abnormalities. The left ventricular internal cavity size was the left ventricle is normal in size. There is borderline left ventricular hypertrophy. Left ventricular diastolic parameters are indeterminate. Right Ventricle: The right ventricular size is mildly enlarged. No increase in right ventricular wall thickness. Global RV systolic function is has normal systolic function. The tricuspid regurgitant velocity is 2.80 m/s, and with an assumed right atrial  pressure of 8 mmHg, the estimated right ventricular systolic pressure is mildly elevated at 39.4 mmHg. Left Atrium: Left atrial size was normal in size. Right Atrium: Right atrial size was normal in size Pericardium: There is no evidence of pericardial effusion. Mitral Valve: The mitral valve is grossly normal. Mild mitral valve regurgitation. Tricuspid Valve: The tricuspid valve is grossly normal. Tricuspid valve regurgitation moderate-severe. Aortic Valve: The aortic valve is tricuspid. Aortic valve regurgitation is not visualized. Mild aortic valve annular calcification. Pulmonic Valve: The pulmonic valve was grossly normal. Pulmonic valve regurgitation is trivial. Pulmonic regurgitation is trivial. Aorta: The aortic root is normal in size and structure. Venous: The inferior vena  cava is dilated in size with greater than 50% respiratory variability, suggesting right atrial pressure of 8 mmHg. IAS/Shunts: Evidence of atrial level shunting detected by color flow Doppler.  LEFT VENTRICLE PLAX 2D LVIDd:         3.60 cm LV PW:         1.00 cm LV IVS:        1.00 cm LVOT diam:     2.10 cm LVOT Area:     3.46 cm  LEFT ATRIUM             Index  RIGHT ATRIUM           Index LA Vol (A2C):   50.7 ml 26.81 ml/m RA Area:     16.60 cm LA Vol (A4C):   62.1 ml 32.84 ml/m RA Volume:   42.00 ml  22.21 ml/m LA Biplane Vol: 60.5 ml 31.99 ml/m   AORTA Ao Root diam: 3.10 cm TRICUSPID VALVE TR Peak grad:   31.4 mmHg TR Vmax:        280.00 cm/s  SHUNTS Systemic Diam: 2.10 cm  Rozann Lesches MD Electronically signed by Rozann Lesches MD Signature Date/Time: 11/13/2019/2:53:05 PM    Final         Scheduled Meds: . amLODipine  5 mg Oral Daily  . benazepril  40 mg Oral Daily  . latanoprost  1 drop Both Eyes QHS  . pantoprazole  40 mg Oral Daily   Continuous Infusions: . sodium chloride 75 mL/hr at 11/14/19 0920  . heparin 1,100 Units/hr (11/13/19 1955)     LOS: 2 days    Time spent: [address], DO 11/14/2019, 4:26 PM

## 2019-11-14 NOTE — Evaluation (Addendum)
Physical Therapy Evaluation Patient Details Name: YARIS KIBE MRN: [id number] DOB: [birth year] Today's Date: 11/14/2019   History of Present Illness  Annette [name] is a 86 y.o. female with medical history significant of GERD, hypertension, primary hyperparathyroidism, chronic hyponatremia, anxiety disorder who was brought in from home with shortness of breath and weakness for about a month. CT angiogram of chest showed bilateral PE.  Clinical Impression  Pt admitted with above. Pt presents with decreased functional mobility secondary to decreased cardiopulmonary endurance, balance deficits, decreased gait speed. Ambulating 75 feet with walker and min guard assist; HR stable in 70's, SpO2 ranging 88-97% on RA. Will benefit from HHPT to address deficits and maximize functional independence.     Follow Up Recommendations Home health PT;Supervision for mobility/OOB (also would benefit from HHOT/aide)    Equipment Recommendations  None recommended by PT    Recommendations for Other Services       Precautions / Restrictions Precautions Precautions: Fall Restrictions Weight Bearing Restrictions: No      Mobility  Bed Mobility Overal bed mobility: Modified Independent                Transfers Overall transfer level: Needs assistance Equipment used: Rolling walker (2 wheeled) Transfers: Sit to/from Stand Sit to Stand: Min guard         General transfer comment: Cues for hand placement; pt pushes up on walker at baseline  Ambulation/Gait Ambulation/Gait assistance: Min guard Gait Distance (Feet): 75 Feet Assistive device: Rolling walker (2 wheeled) Gait Pattern/deviations: Step-through pattern;Decreased stride length;Drifts right/left Gait velocity: decreased   General Gait Details: Tendency for drifting and bumping into objects with walker; needs cues for maintaining straight path and activity pacing  Stairs            Wheelchair Mobility    Modified  [place] (Stroke Patients Only)       Balance Overall balance assessment: Needs assistance Sitting-balance support: Feet supported Sitting balance-Leahy Scale: Good     Standing balance support: Bilateral upper extremity supported Standing balance-Leahy Scale: Fair                               Pertinent Vitals/Pain Pain Assessment: Faces Faces Pain Scale: No hurt    Home Living Family/patient expects to be discharged to:: Private residence Living Arrangements: Children(son and daughter in law) Available Help at Discharge: Family;Available PRN/intermittently Type of Home: House Home Access: Ramped entrance     Home Layout: One level Home Equipment: Eclectic - 4 wheels;Shower seat      Prior Function Level of Independence: Independent with assistive device(s)         Comments: Sales executive; uses Radiation protection practitioner. Able to cook simple meals, manages own medications. Pt son cleans house. Reports one recent fall.      Hand Dominance        Extremity/Trunk Assessment   Upper Extremity Assessment Upper Extremity Assessment: Overall WFL for tasks assessed    Lower Extremity Assessment Lower Extremity Assessment: Generalized weakness    Cervical / Trunk Assessment Cervical / Trunk Assessment: Kyphotic  Communication   Communication: No difficulties  Cognition Arousal/Alertness: Awake/alert Behavior During Therapy: Anxious Overall Cognitive Status: History of cognitive impairments - at baseline Area of Impairment: Attention                   Current Attention Level: Focused;Sustained           General Comments: Pt  A&Ox4, exhibiting anxious behaviors at times which limit attention during tasks. Very distracted by lines. History of sundowning behaviors per daughter in law.       General Comments      Exercises     Assessment/Plan    PT Assessment Patient needs continued PT services  PT Problem List Decreased strength;Decreased  activity tolerance;Decreased balance;Decreased mobility       PT Treatment Interventions DME instruction;Gait training;Stair training;Functional mobility training;Therapeutic activities;Therapeutic exercise;Balance training;Patient/family education    PT Goals (Current goals can be found in the Care Plan section)  Acute Rehab PT Goals Patient Stated Goal: be stronger PT Goal Formulation: With patient Time For Goal Achievement: 11/28/19 Potential to Achieve Goals: Good    Frequency Min 3X/week   Barriers to discharge        Co-evaluation               AM-PAC PT "6 Clicks" Mobility  Outcome Measure Help needed turning from your back to your side while in a flat bed without using bedrails?: None Help needed moving from lying on your back to sitting on the side of a flat bed without using bedrails?: None Help needed moving to and from a bed to a chair (including a wheelchair)?: A Little Help needed standing up from a chair using your arms (e.g., wheelchair or bedside chair)?: A Little Help needed to walk in hospital room?: A Little Help needed climbing 3-5 steps with a railing? : A Lot 6 Click Score: 19    End of Session Equipment Utilized During Treatment: Gait belt Activity Tolerance: Patient tolerated treatment well Patient left: in bed;with call bell/phone within reach;with bed alarm set Nurse Communication: Mobility status PT Visit Diagnosis: Difficulty in walking, not elsewhere classified (R26.2);History of falling (Z91.81);Unsteadiness on feet (R26.81)    Time: [id number] PT Time Calculation (min) (ACUTE ONLY): 28 min   Charges:   PT Evaluation $PT Eval Moderate Complexity: 1 Mod PT Treatments $Gait Training: 8-22 mins        Annette [name], PT, DPT Acute Rehabilitation Services Pager [phone] Office [phone]   Willy Eddy 11/14/2019, 4:04 PM

## 2019-11-14 NOTE — Progress Notes (Signed)
[place] for Heparin Indication: pulmonary embolus  Allergies  Allergen Reactions  . Codeine Nausea And Vomiting    Patient Measurements: Height: 5\' 3"  (160 cm) Weight: 184 lb 3.2 oz (83.6 kg) IBW/kg (Calculated) : 52.4 Heparin Dosing Weight: 72 kg  Vital Signs: Temp: 98.2 F (36.8 C) (01/02 0642) Temp Source: Oral (01/02 0642) BP: 120/89 (01/01 2142) Pulse Rate: 87 (01/02 0642)  Labs: Recent Labs    11/12/19 1606 11/12/19 1757 11/13/19 0740 11/13/19 1630 11/14/19 0555  HGB 14.3  --  13.9  --  12.1  HCT 42.7  --  39.5  --  36.7  PLT 234  --  237  --  220  HEPARINUNFRC  --   --  0.55 0.36 0.25*  CREATININE 0.95  --  0.87  --   --   TROPONINIHS 25* 28*  --   --   --     Estimated Creatinine Clearance: 46.7 mL/min (by C-G formula based on SCr of 0.87 mg/dL).  Assessment: 86 y.o. female admitted for CT confirmed, bilateral PE with heart strain. No anticoagulation PTA.   Heparin level is SUBtherapeutic this AM but after speaking with the nurse there was a line issue overnight and the IV site has been leaking slightly at the patient's elbow where the heparin is infusing. The nurse told me she would have the line issue fixed as soon as possible. Will continue current rate due to line issue and re-check level in 8 hours.    Hgb trending down but no bleeding noted per nursing.   Goal of Therapy:  Heparin level 0.3-0.7 units/ml Monitor platelets by anticoagulation protocol: Yes   Plan:  Continue heparin gtt @ 1100 units/hr Check heparin level in 8 hours Monitor daily heparin level and CBC Follow-up oral anticoagulation  Kennon Holter, PharmD PGY1 Ambulatory Care Pharmacy Resident [employer] Phone: [phone] 11/14/2019 7:57 AM

## 2019-11-14 NOTE — TOC Transition Note (Addendum)
Transition of Care [facility]) - CM/SW Discharge Note   Patient Details  Name: Annette [name] MRN: [id number] Date of Birth: Oct 16, 1930  Transition of Care [facility]) CM/SW Contact:  Claudie Leach, RN 11/14/2019, 4:16 PM   Clinical Narrative:    Pt to dc home with [facility]. Medicare list reviewed with patient.  Referral for [facility] PT/RN accepted by [facility].    Copay for Eliquis $45.   Eliquis 30 day free card placed on shadow chart to be available for d/c.   Final next level of care: [place] Barriers to Discharge: No Barriers Identified   Patient Goals and CMS Choice Patient states their goals for this hospitalization and ongoing recovery are:: to go home CMS Medicare.gov Compare Post Acute Care list provided to:: Patient Choice offered to / list presented to : Patient   Discharge Plan and Services      HH Arranged: PT, RN [facility] Agency: [place] Date [place]: 11/14/19 Time [place]: [id number] Representative spoke with at [place]: Tommi Rumps

## 2019-11-14 NOTE — Progress Notes (Signed)
[place] for Heparin Indication: pulmonary embolus  Allergies  Allergen Reactions  . Codeine Nausea And Vomiting    Patient Measurements: Height: 5\' 3"  (160 cm) Weight: 184 lb 3.2 oz (83.6 kg) IBW/kg (Calculated) : 52.4 Heparin Dosing Weight: 72 kg  Vital Signs: Temp: 98.2 F (36.8 C) (01/02 0642) Temp Source: Oral (01/02 0642) BP: 130/83 (01/02 0834) Pulse Rate: 74 (01/02 0834)  Labs: Recent Labs    11/12/19 1606 11/12/19 1606 11/12/19 1757 11/13/19 0740 11/13/19 1630 11/14/19 0555 11/14/19 1639  HGB 14.3  --   --  13.9  --  12.1  --   HCT 42.7  --   --  39.5  --  36.7  --   PLT 234  --   --  237  --  220  --   HEPARINUNFRC  --    < >  --  0.55 0.36 0.25* 0.33  CREATININE 0.95  --   --  0.87  --   --  1.10*  TROPONINIHS 25*  --  28*  --   --   --   --    < > = values in this interval not displayed.    Estimated Creatinine Clearance: 36.9 mL/min (A) (by C-G formula based on SCr of 1.1 mg/dL (H)).  Assessment: 86 y.o. female admitted for CT confirmed, bilateral PE with heart strain. No anticoagulation PTA. Heparin level is now therapeutic. No bleeding noted.   Goal of Therapy:  Heparin level 0.3-0.7 units/ml Monitor platelets by anticoagulation protocol: Yes   Plan:  Continue heparin gtt @ 1100 units/hr Daily heparin level and CBC  Salome Arnt, PharmD, BCPS Clinical Pharmacist Please see AMION for all pharmacy numbers 11/14/2019 5:30 PM

## 2019-11-15 LAB — CBC
HCT: 41.2 % (ref 36.0–46.0)
Hemoglobin: 13.3 g/dL (ref 12.0–15.0)
MCH: 29.2 pg (ref 26.0–34.0)
MCHC: 32.3 g/dL (ref 30.0–36.0)
MCV: 90.4 fL (ref 80.0–100.0)
Platelets: 230 10*3/uL (ref 150–400)
RBC: 4.56 MIL/uL (ref 3.87–5.11)
RDW: 13.7 % (ref 11.5–15.5)
WBC: 6.7 10*3/uL (ref 4.0–10.5)
nRBC: 0 % (ref 0.0–0.2)

## 2019-11-15 LAB — BASIC METABOLIC PANEL
Anion gap: 5 (ref 5–15)
BUN: 14 mg/dL (ref 8–23)
CO2: 27 mmol/L (ref 22–32)
Calcium: 10.5 mg/dL — ABNORMAL HIGH (ref 8.9–10.3)
Chloride: 103 mmol/L (ref 98–111)
Creatinine, Ser: 0.97 mg/dL (ref 0.44–1.00)
GFR calc Af Amer: >60 mL/min (ref >60)
GFR calc non Af Amer: 53 mL/min — ABNORMAL LOW (ref >60)
Glucose, Bld: 104 mg/dL — ABNORMAL HIGH (ref 70–99)
Potassium: 4.5 mmol/L (ref 3.5–5.1)
Sodium: 135 mmol/L (ref 135–145)

## 2019-11-15 LAB — HEPARIN LEVEL (UNFRACTIONATED): Heparin Unfractionated: 0.3 IU/mL (ref 0.30–0.70)

## 2019-11-15 NOTE — Plan of Care (Signed)
  Problem: Education: Goal: Knowledge of General Education information will improve Description: Including pain rating scale, medication(s)/side effects and non-pharmacologic comfort measures Outcome: Progressing   Problem: Health Behavior/Discharge Planning: Goal: Ability to manage health-related needs will improve Outcome: Progressing   Problem: Clinical Measurements: Goal: Ability to maintain clinical measurements within normal limits will improve Outcome: Progressing Goal: Diagnostic test results will improve Outcome: Progressing   Problem: Activity: Goal: Risk for activity intolerance will decrease Outcome: Progressing   Problem: Coping: Goal: Level of anxiety will decrease Outcome: Progressing   

## 2019-11-15 NOTE — Progress Notes (Signed)
[place] for Heparin Indication: pulmonary embolus  Allergies  Allergen Reactions  . Codeine Nausea And Vomiting    Patient Measurements: Height: 5\' 3"  (160 cm) Weight: 195 lb 8 oz (88.7 kg) IBW/kg (Calculated) : 52.4 Heparin Dosing Weight: 72 kg  Vital Signs: Temp: 97.9 F (36.6 C) (01/03 0523) Temp Source: Oral (01/03 0523) BP: 143/82 (01/03 0912) Pulse Rate: 66 (01/03 0523)  Labs: Recent Labs    11/12/19 1606 11/12/19 1606 11/12/19 1757 11/13/19 0740 11/14/19 0555 11/14/19 1639 11/15/19 0842  HGB 14.3  --   --  13.9 12.1  --  13.3  HCT 42.7  --   --  39.5 36.7  --  41.2  PLT 234  --   --  237 220  --  230  HEPARINUNFRC  --    < >  --  0.55 0.25* 0.33 0.30  CREATININE 0.95  --   --  0.87  --  1.10*  --   TROPONINIHS 25*  --  28*  --   --   --   --    < > = values in this interval not displayed.    Estimated Creatinine Clearance: 38.1 mL/min (A) (by C-G formula based on SCr of 1.1 mg/dL (H)).  Assessment: 86 y.o. female admitted for CT confirmed, bilateral PE with heart strain. Pharmacy has been consulted to dose heparin.  No anticoagulation PTA. No bleeding or problems with infusion per RN. Hg/Hct are wnls. Plts are also wnls. Heparin level is at the low end of therapeutic range at 0.3 on heparin rate of 1,100 units/hr.  Goal of Therapy:  Heparin level 0.3-0.7 units/ml Monitor platelets by anticoagulation protocol: Yes   Plan:  Increase heparin gtt to 1150 units/hr to maintain therapeutic heparin level. Daily heparin level and CBC Watch for signs/symptoms of bleeding.  Sherren Kerns, PharmD PGY1 Acute Care Pharmacy Resident Please see AMION for all pharmacy numbers 11/15/2019 10:56 AM

## 2019-11-15 NOTE — Progress Notes (Addendum)
PROGRESS NOTE    Annette [name]  [id number] DOB: 04/22/1930 DOA: 11/12/2019 PCP: [place], [name], MD  Outpatient Specialists:   Brief Narrative:  11/12/19: to ED w/ SOB and weakness, Dx bilateral PE (CT angiogram of the chest showed acute PE with small moderate clot burden probably in the right lung filling defects.  Also involving the distal main pulmonary artery.  Also 2 pulmonary nodules), started on heparin gtt 11/13/19 - pt initially refused Echo but after assurance that there was no radiation involved she is amenable to this test.  Heparin gtt started, Day 1 11/14/19 - day 2 of heparin drip, PESI score class III, will continue heparin another 1-2 days and then she should be ok to go home, planning for ambulation w/ O2 monitoring tomorrow, PT saw her today recommends home health   Today 11/15/19: Given high-risk PE, continuing with heparin gtt for today at least to give 3 days on gtt, possibly home tomorrow on Eliquis and w/ home health. Pending: ambulate w/ monitor SaO2, if patient doing well w/ this can d/c tomorrow, may need home O2      Assessment & Plan:   Principal Problem:   Pulmonary embolism and infarction [facility]) Active Problems:   Primary hyperparathyroidism ([place])   Essential hypertension   GERD   Anxiety disorder, unspecified   Hyponatremia    Bilateral pulmonary embolism:  Appears to be submassive especially in the right lung.  Patient has been continues on heparin drip.  Close monitoring.  Oxygen and pain management.  Once patient is hemodynamically stable transition to oral anticoagulants prior to discharge based on her safety profile with her history of frequent falls. I think will be ok to transition next day or two given high risk PE will leave her on heparin gtt another 1-2 days for total 3 days on the gtt Eliquis has already been approved for outpatient.   Essential hypertension:  Amlodipine 2.5, benazepril 40   Hyponatremia:  Patient has chronic  hyponatremia.  She probably is dehydrated also. Has been on saline, to monitor BMP again tomorrow   Anxiety disorder: Continue home regimen.  GERD: Continue PPIs   Of note, family has some concerns for patient getting a bit more confused in the evenings, they have some concerns that she wears her deceased husband shoes which are too big for her, she refuses to wear her own shoes, and they are nervous given her shuffling gait that she will fall.  They give me permission to lose the shoes while here in the hospital, will see what I can do  All other questions answered, family is aware of the plan, will hopefully be able to discharge patient tomorrow    DVT prophylaxis: Heparin drip, planning to transition to Eliquis eventually  Code Status: Full code Family Communication: spoke with son Elta [place] and his wife. They note some concern w/ confusion in the evenings/afternoons, see above  Disposition Plan: Hopefully tomorrow home w/ [facility] Consults called: Pulmonary critical care Admission status: Inpatien   Procedures: Echocardiogram results pending   Antimicrobials: Anti-infectives (From admission, onward)   None        Subjective: Patient is feeling well, is a bit anxious this morning. She's been calling family a lot, per nurse report.   Objective: Vitals:   11/15/19 0523 11/15/19 0910 11/15/19 0912 11/15/19 1435  BP: 120/74 (!) 143/82 (!) 143/82 123/68  Pulse: 66   64  Resp: 18  (!) 22   Temp: 97.9 F (36.6 C)  97.6 F (36.4 C)  TempSrc: Oral   Oral  SpO2: 94%   98%  Weight: 88.7 kg     Height:        Intake/Output Summary (Last 24 hours) at 11/15/2019 1441 Last data filed at 11/15/2019 1117 Gross per 24 hour  Intake 600 ml  Output 3100 ml  Net -2500 ml   Filed Weights   11/13/19 0128 11/14/19 0642 11/15/19 0523  Weight: 86.1 kg 83.6 kg 88.7 kg    Examination:  General exam: Appears calm and comfortable  Respiratory system: Clear to auscultation. Respiratory  effort normal. Cardiovascular system: S1 & S2 heard, RRR. No JVD, murmurs, rubs, gallops or clicks. No pedal edema. Gastrointestinal system: Abdomen is nondistended, soft and nontender. No organomegaly or masses felt. Normal bowel sounds heard. Central nervous system: Alert and oriented. No focal neurological deficits. Extremities: Symmetric 5 x 5 power. Skin: No rashes, lesions or ulcers Psychiatry: Judgement and insight appear fair. Mood & affect appropriate.     Data Reviewed: I have personally reviewed following labs and imaging studies  CBC: Recent Labs  Lab 11/12/19 1606 11/13/19 0740 11/14/19 0555 11/15/19 0842  WBC 6.6 6.8 8.4 6.7  NEUTROABS 4.8  --   --   --   HGB 14.3 13.9 12.1 13.3  HCT 42.7 39.5 36.7 41.2  MCV 87.5 84.4 88.9 90.4  PLT 234 237 220 [id number]   Basic Metabolic Panel: Recent Labs  Lab 11/12/19 1606 11/13/19 0740 11/14/19 1639  NA 123* 125* 128*  K 4.2 4.2 4.4  CL 88* 91* 94*  CO2 25 25 27   GLUCOSE 107* 160* 106*  BUN 16 14 15   CREATININE 0.95 0.87 1.10*  CALCIUM 10.9* 10.8* 10.7*  MG 1.8  --   --    GFR: Estimated Creatinine Clearance: 38.1 mL/min (A) (by C-G formula based on SCr of 1.1 mg/dL (H)). Liver Function Tests: Recent Labs  Lab 11/12/19 1606 11/13/19 0740  AST 37 27  ALT 37 34  ALKPHOS 77 72  BILITOT 0.9 0.9  PROT 6.3* 6.0*  ALBUMIN 3.9 3.6   No results for input(s): LIPASE, AMYLASE in the last 168 hours. No results for input(s): AMMONIA in the last 168 hours. Coagulation Profile: No results for input(s): INR, PROTIME in the last 168 hours. Cardiac Enzymes: No results for input(s): CKTOTAL, CKMB, CKMBINDEX, TROPONINI in the last 168 hours. BNP (last 3 results) No results for input(s): PROBNP in the last 8760 hours. HbA1C: No results for input(s): HGBA1C in the last 72 hours. CBG: No results for input(s): GLUCAP in the last 168 hours. Lipid Profile: No results for input(s): CHOL, HDL, LDLCALC, TRIG, CHOLHDL, LDLDIRECT in  the last 72 hours. Thyroid Function Tests: No results for input(s): TSH, T4TOTAL, FREET4, T3FREE, THYROIDAB in the last 72 hours. Anemia Panel: No results for input(s): VITAMINB12, FOLATE, FERRITIN, TIBC, IRON, RETICCTPCT in the last 72 hours. Urine analysis:    Component Value Date/Time   COLORURINE YELLOW 08/14/2018 1723   APPEARANCEUR CLEAR 08/14/2018 1723   LABSPEC 1.013 08/14/2018 1723   PHURINE 5.0 08/14/2018 1723   GLUCOSEU NEGATIVE 08/14/2018 1723   HGBUR NEGATIVE 08/14/2018 1723   BILIRUBINUR NEGATIVE 08/14/2018 1723   KETONESUR NEGATIVE 08/14/2018 1723   PROTEINUR NEGATIVE 08/14/2018 1723   NITRITE NEGATIVE 08/14/2018 1723   LEUKOCYTESUR NEGATIVE 08/14/2018 1723   Sepsis Labs: @LABRCNTIP (procalcitonin:4,lacticidven:4)  ) Recent Results (from the past 240 hour(s))  SARS CORONAVIRUS 2 (TAT 6-24 HRS) Nasopharyngeal Nasopharyngeal Swab     Status:  None   Collection Time: 11/12/19  5:57 PM   Specimen: Nasopharyngeal Swab  Result Value Ref Range Status   SARS Coronavirus 2 NEGATIVE NEGATIVE Final    Comment: (NOTE) SARS-CoV-2 target nucleic acids are NOT DETECTED. The SARS-CoV-2 RNA is generally detectable in upper and lower respiratory specimens during the acute phase of infection. Negative results do not preclude SARS-CoV-2 infection, do not rule out co-infections with other pathogens, and should not be used as the sole basis for treatment or other patient management decisions. Negative results must be combined with clinical observations, patient history, and epidemiological information. The expected result is Negative. Fact Sheet for Patients: SugarRoll.be Fact Sheet for Healthcare Providers: https://www.woods-mathews.com/ This test is not yet approved or cleared by the [place] FDA and  has been authorized for detection and/or diagnosis of SARS-CoV-2 by FDA under an Emergency Use Authorization (EUA). This EUA will  remain  in effect (meaning this test can be used) for the duration of the COVID-19 declaration under Section 56 4(b)(1) of the Act, 21 U.S.C. section 360bbb-3(b)(1), unless the authorization is terminated or revoked sooner. Performed at [facility], [place] [address] [place], [place] [address]          Radiology Studies: ECHOCARDIOGRAM COMPLETE  Result Date: 11/13/2019   ECHOCARDIOGRAM REPORT   Patient Name:   Annette [name] Date of Exam: 11/13/2019 Medical Rec #:  [id number]       Height:       63.0 in Accession #:    [id number]      Weight:       189.8 lb Date of Birth:  Feb 13, 1930      BSA:          1.89 m Patient Age:    35 years        BP:           137/73 mmHg Patient Gender: F               HR:           81 bpm. Exam Location:  Inpatient Procedure: 2D Echo Indications:    pulmonary embolus 415.19  History:        Patient has no prior history of Echocardiogram examinations.                 Arrythmias:Atrial Fibrillation; Risk Factors:Hypertension.  Sonographer:    Johny Chess Referring Phys: [place]  1. Left ventricular ejection fraction, by visual estimation, is 60 to 65%. The left ventricle has normal function. There is borderline left ventricular hypertrophy.  2. Left ventricular diastolic parameters are indeterminate.  3. The left ventricle has no regional wall motion abnormalities.  4. Global right ventricle has normal systolic function.The right ventricular size is mildly enlarged. No increase in right ventricular wall thickness.  5. Left atrial size was normal.  6. Right atrial size was normal.  7. The mitral valve is grossly normal. Mild mitral valve regurgitation.  8. The tricuspid valve is grossly normal.  9. The aortic valve is tricuspid. Aortic valve regurgitation is not visualized. 10. The pulmonic valve was grossly normal. Pulmonic valve regurgitation is trivial. 11. Mildly elevated pulmonary artery systolic pressure. 12. The tricuspid  regurgitant velocity is 2.80 m/s, and with an assumed right atrial pressure of 8 mmHg, the estimated right ventricular systolic pressure is mildly elevated at 39.4 mmHg. 13. The inferior vena cava is dilated in size with >50% respiratory variability, suggesting right atrial  pressure of 8 mmHg. 14. Evidence of atrial level shunting detected by color flow Doppler. FINDINGS  Left Ventricle: Left ventricular ejection fraction, by visual estimation, is 60 to 65%. The left ventricle has normal function. The left ventricle has no regional wall motion abnormalities. The left ventricular internal cavity size was the left ventricle is normal in size. There is borderline left ventricular hypertrophy. Left ventricular diastolic parameters are indeterminate. Right Ventricle: The right ventricular size is mildly enlarged. No increase in right ventricular wall thickness. Global RV systolic function is has normal systolic function. The tricuspid regurgitant velocity is 2.80 m/s, and with an assumed right atrial  pressure of 8 mmHg, the estimated right ventricular systolic pressure is mildly elevated at 39.4 mmHg. Left Atrium: Left atrial size was normal in size. Right Atrium: Right atrial size was normal in size Pericardium: There is no evidence of pericardial effusion. Mitral Valve: The mitral valve is grossly normal. Mild mitral valve regurgitation. Tricuspid Valve: The tricuspid valve is grossly normal. Tricuspid valve regurgitation moderate-severe. Aortic Valve: The aortic valve is tricuspid. Aortic valve regurgitation is not visualized. Mild aortic valve annular calcification. Pulmonic Valve: The pulmonic valve was grossly normal. Pulmonic valve regurgitation is trivial. Pulmonic regurgitation is trivial. Aorta: The aortic root is normal in size and structure. Venous: The inferior vena cava is dilated in size with greater than 50% respiratory variability, suggesting right atrial pressure of 8 mmHg. IAS/Shunts: Evidence of  atrial level shunting detected by color flow Doppler.  LEFT VENTRICLE PLAX 2D LVIDd:         3.60 cm LV PW:         1.00 cm LV IVS:        1.00 cm LVOT diam:     2.10 cm LVOT Area:     3.46 cm  LEFT ATRIUM             Index       RIGHT ATRIUM           Index LA Vol (A2C):   50.7 ml 26.81 ml/m RA Area:     16.60 cm LA Vol (A4C):   62.1 ml 32.84 ml/m RA Volume:   42.00 ml  22.21 ml/m LA Biplane Vol: 60.5 ml 31.99 ml/m   AORTA Ao Root diam: 3.10 cm TRICUSPID VALVE TR Peak grad:   31.4 mmHg TR Vmax:        280.00 cm/s  SHUNTS Systemic Diam: 2.10 cm  Rozann Lesches MD Electronically signed by Rozann Lesches MD Signature Date/Time: 11/13/2019/2:53:05 PM    Final         Scheduled Meds: . amLODipine  5 mg Oral Daily  . benazepril  40 mg Oral Daily  . latanoprost  1 drop Both Eyes QHS  . pantoprazole  40 mg Oral Daily   Continuous Infusions: . sodium chloride 75 mL/hr at 11/14/19 0920  . heparin 1,150 Units/hr (11/15/19 1105)     LOS: 3 days    Time spent: [place] [facility] Pager [phone] or secure message in [place]

## 2019-11-15 NOTE — Evaluation (Signed)
Occupational Therapy Evaluation Patient Details Name: Annette [name] MRN: [id number] DOB: 1930-01-31 Today's Date: 11/15/2019    History of Present Illness Annette [name] is a 86 y.o. female with medical history significant of GERD, hypertension, primary hyperparathyroidism, chronic hyponatremia, anxiety disorder who was brought in from home with shortness of breath and weakness for about a month. CT angiogram of chest showed bilateral PE.   Clinical Impression   Prior to admission, Pt was MOd I with DME in the home. Her son manages her medications, but she cooks simple meals. Today she is mod I for bed mobility, min guard assist for transfers and in room mobility with main assist needed for line management (source of anxiety for patient). Overall she was min guard for UB and LB ADL. On 2L O2 throughout session with DOE 1/4 SpO2 >90%. Pt will benefit from skilled OT in the acute setting, suspect that she will not need OT follow up post-acute.    Follow Up Recommendations  No OT follow up;Supervision/Assistance - 24 hour(initially)    Equipment Recommendations  None recommended by OT(Pt has appropriate DME)    Recommendations for Other Services       Precautions / Restrictions Precautions Precautions: Fall Restrictions Weight Bearing Restrictions: No      Mobility Bed Mobility Overal bed mobility: Modified Independent                Transfers Overall transfer level: Needs assistance Equipment used: Rolling walker (2 wheeled) Transfers: Sit to/from Stand Sit to Stand: Min guard         General transfer comment: Cues for hand placement; pt pushes up on walker at baseline    Balance Overall balance assessment: Needs assistance Sitting-balance support: Feet supported Sitting balance-Leahy Scale: Good     Standing balance support: Bilateral upper extremity supported Standing balance-Leahy Scale: Fair Standing balance comment: leans against sink during ADL                           ADL either performed or assessed with clinical judgement   ADL Overall ADL's : Needs assistance/impaired Eating/Feeding: Set up;Sitting   Grooming: Min guard;Standing;Wash/dry hands;Oral care Grooming Details (indicate cue type and reason): distracted by lines Upper Body Bathing: Min guard;Sitting   Lower Body Bathing: Min guard;Sitting/lateral leans   Upper Body Dressing : Set up;Sitting   Lower Body Dressing: Min guard;Sitting/lateral leans Lower Body Dressing Details (indicate cue type and reason): able to don own socks EOB Toilet Transfer: Min guard;Ambulation   Toileting- Clothing Manipulation and Hygiene: Min guard;Sitting/lateral lean       Functional mobility during ADLs: Min guard General ADL Comments: min guard for safety, line management. Pt with decreased activity tolerance, on RA throughout session     Vision Patient Visual Report: No change from baseline       Perception     Praxis      Pertinent Vitals/Pain Pain Assessment: No/denies pain Faces Pain Scale: No hurt     Hand Dominance Right   Extremity/Trunk Assessment Upper Extremity Assessment Upper Extremity Assessment: Overall WFL for tasks assessed   Lower Extremity Assessment Lower Extremity Assessment: Defer to PT evaluation   Cervical / Trunk Assessment Cervical / Trunk Assessment: Kyphotic   Communication Communication Communication: No difficulties   Cognition Arousal/Alertness: Awake/alert Behavior During Therapy: Anxious Overall Cognitive Status: History of cognitive impairments - at baseline Area of Impairment: Attention  Current Attention Level: Focused;Sustained           General Comments: multiple cues for attention on tasks at hand   General Comments       Exercises     Shoulder Instructions      Home Living Family/patient expects to be discharged to:: Private residence Living Arrangements: Children(son and  daughter in law) Available Help at Discharge: Family;Available PRN/intermittently Type of Home: House Home Access: Ramped entrance     Home Layout: One level     Bathroom Shower/Tub: Occupational psychologist: Standard     Home Equipment: Environmental consultant - 4 wheels;Shower seat          Prior Functioning/Environment Level of Independence: Independent with assistive device(s)        Comments: [name] executive; uses Radiation protection practitioner. Able to cook simple meals, manages own medications. Pt son cleans house. Reports one recent fall.         OT Problem List: Decreased activity tolerance;Impaired balance (sitting and/or standing);Decreased safety awareness;Cardiopulmonary status limiting activity      OT Treatment/Interventions: Self-care/ADL training;DME and/or AE instruction;Therapeutic activities;Patient/family education;Balance training    OT Goals(Current goals can be found in the care plan section) Acute Rehab OT Goals Patient Stated Goal: be stronger OT Goal Formulation: With patient Time For Goal Achievement: 11/29/19 Potential to Achieve Goals: Good ADL Goals Pt Will Perform Grooming: with modified independence;standing Pt Will Transfer to Toilet: with modified independence;ambulating Pt Will Perform Toileting - Clothing Manipulation and hygiene: with modified independence;sit to/from stand;sitting/lateral leans  OT Frequency: Min 2X/week   Barriers to D/C:            Co-evaluation              AM-PAC OT "6 Clicks" Daily Activity     Outcome Measure Help from another person eating meals?: None Help from another person taking care of personal grooming?: A Little Help from another person toileting, which includes using toliet, bedpan, or urinal?: A Little Help from another person bathing (including washing, rinsing, drying)?: A Little Help from another person to put on and taking off regular upper body clothing?: None Help from another person to put on and  taking off regular lower body clothing?: A Little 6 Click Score: 20   End of Session Equipment Utilized During Treatment: Gait belt;Rolling walker Nurse Communication: Mobility status  Activity Tolerance: Patient tolerated treatment well Patient left: in chair;with call bell/phone within reach;with chair alarm set  OT Visit Diagnosis: Other abnormalities of gait and mobility (R26.89);Muscle weakness (generalized) (M62.81)                Time: [id number] OT Time Calculation (min): 21 min Charges:  OT General Charges $OT Visit: 1 Visit OT Evaluation $OT Eval Moderate Complexity: [place] OTR/L Acute Rehabilitation Services Pager: [phone] Office: [phone]  Mickel Baas 11/15/2019, 6:10 PM

## 2019-11-15 NOTE — Progress Notes (Signed)
SATURATION QUALIFICATIONS: (This note is used to comply with regulatory documentation for home oxygen)  Patient Saturations on Room Air at Rest = 100%  Patient Saturations on Room Air while Ambulating = 94%  Patient Saturations on n/a Liters of oxygen while Ambulating = n/a %  Please briefly explain why patient needs home oxygen: Pt does not need home oxygen.

## 2019-11-16 LAB — CBC
HCT: 44 % (ref 36.0–46.0)
Hemoglobin: 14.1 g/dL (ref 12.0–15.0)
MCH: 29.4 pg (ref 26.0–34.0)
MCHC: 32 g/dL (ref 30.0–36.0)
MCV: 91.7 fL (ref 80.0–100.0)
Platelets: 244 10*3/uL (ref 150–400)
RBC: 4.8 MIL/uL (ref 3.87–5.11)
RDW: 13.9 % (ref 11.5–15.5)
WBC: 6.5 10*3/uL (ref 4.0–10.5)
nRBC: 0 % (ref 0.0–0.2)

## 2019-11-16 LAB — BASIC METABOLIC PANEL
Anion gap: 6 (ref 5–15)
BUN: 13 mg/dL (ref 8–23)
CO2: 27 mmol/L (ref 22–32)
Calcium: 10.8 mg/dL — ABNORMAL HIGH (ref 8.9–10.3)
Chloride: 102 mmol/L (ref 98–111)
Creatinine, Ser: 1.06 mg/dL — ABNORMAL HIGH (ref 0.44–1.00)
GFR calc Af Amer: 55 mL/min — ABNORMAL LOW (ref >60)
GFR calc non Af Amer: 47 mL/min — ABNORMAL LOW (ref >60)
Glucose, Bld: 96 mg/dL (ref 70–99)
Potassium: 4.7 mmol/L (ref 3.5–5.1)
Sodium: 135 mmol/L (ref 135–145)

## 2019-11-16 LAB — HEPARIN LEVEL (UNFRACTIONATED): Heparin Unfractionated: 0.53 IU/mL (ref 0.30–0.70)

## 2019-11-16 MED ORDER — APIXABAN 5 MG PO TABS
10.0000 mg | ORAL_TABLET | Freq: Two times a day (BID) | ORAL | Status: DC
  Administered 2019-11-16: 10 mg via ORAL
  Filled 2019-11-16: qty 2

## 2019-11-16 MED ORDER — APIXABAN 5 MG PO TABS: 5.0000 mg | ORAL_TABLET | Freq: Two times a day (BID) | ORAL | 0 refills | Status: DC

## 2019-11-16 MED ORDER — APIXABAN 5 MG PO TABS: 5.0000 mg | ORAL_TABLET | Freq: Two times a day (BID) | ORAL | Status: DC

## 2019-11-16 MED ORDER — APIXABAN 2.5 MG PO TABS: 2.5000 mg | ORAL_TABLET | Freq: Two times a day (BID) | ORAL | Status: DC

## 2019-11-16 MED ORDER — APIXABAN 5 MG PO TABS: ORAL_TABLET | ORAL | 0 refills | Status: DC

## 2019-11-16 NOTE — Progress Notes (Signed)
PT Cancellation Note  Patient Details Name: Annette [name] MRN: [id number] DOB: 1930/10/25   Cancelled Treatment:    Reason Eval/Treat Not Completed: Other (comment) RN reports patient is discharging shortly. Holding PT for now. If patient has urgent rehab needs, please direct message this therapist in [place] (8am-4pm) or call acute rehab office at number below.     Windell Norfolk, DPT, PN1   Supplemental Physical Therapist [facility]    Pager [phone] Acute Rehab Office [phone]

## 2019-11-16 NOTE — Progress Notes (Signed)
Pre-rounds chart review: LOS: 4  Overnight: no events per chart Pending: nothing!   Dispo: once I see her today, hopefully can d/c home. HH is already in place, eliquis covered, confirm local pharmacy, confirm PCP f/u     [facility]  Pager [phone]  or secure message in [place]

## 2019-11-16 NOTE — Discharge Instructions (Signed)
Information on my medicine - ELIQUIS (apixaban)  This medication education was reviewed with me or my healthcare representative as part of my discharge preparation.  The pharmacist that spoke with me during my hospital stay was:  Georgina Peer, [facility]  Why was Eliquis prescribed for you? Eliquis was prescribed to treat blood clots that may have been found in the veins of your legs (deep vein thrombosis) or in your lungs (pulmonary embolism) and to reduce the risk of them occurring again.  What do You need to know about Eliquis ? The starting dose is 10 mg (two 5 mg tablets) taken TWICE daily then on 11/26/19  the dose is reduced to ONE 5 mg tablet taken TWICE daily.  Eliquis may be taken with or without food.   Try to take the dose about the same time in the morning and in the evening. If you have difficulty swallowing the tablet whole please discuss with your pharmacist how to take the medication safely.  Take Eliquis exactly as prescribed and DO NOT stop taking Eliquis without talking to the doctor who prescribed the medication.  Stopping may increase your risk of developing a new blood clot.  Refill your prescription before you run out.  After discharge, you should have regular check-up appointments with your healthcare provider that is prescribing your Eliquis.    What do you do if you miss a dose? If a dose of ELIQUIS is not taken at the scheduled time, take it as soon as possible on the same day and twice-daily administration should be resumed. The dose should not be doubled to make up for a missed dose.  Important Safety Information A possible side effect of Eliquis is bleeding. You should call your healthcare provider right away if you experience any of the following: ? Bleeding from an injury or your nose that does not stop. ? Unusual colored urine (red or dark brown) or unusual colored stools (red or black). ? Unusual bruising for unknown reasons. ? A serious fall or if  you hit your head (even if there is no bleeding).  Some medicines may interact with Eliquis and might increase your risk of bleeding or clotting while on Eliquis. To help avoid this, consult your healthcare provider or pharmacist prior to using any new prescription or non-prescription medications, including herbals, vitamins, non-steroidal anti-inflammatory drugs (NSAIDs) and supplements.  This website has more information on Eliquis (apixaban): http://www.eliquis.com/eliquis/home

## 2019-11-16 NOTE — Discharge Summary (Signed)
Physician Discharge Summary  Annette [name] [id number] DOB: 04-02-1930 DOA: 11/12/2019  PCP: Martinique, [name], MD  Admit date: 11/12/2019 Discharge date: 11/16/2019  Admitted From: home Disposition: home w/ [facility]  Recommendations for Outpatient Follow-up:  1. Follow up with PCP in 1 week, or this week if possible  2. Please obtain BMP/CBC in one week 3. Given high risk PE, consider at least 3-6 mos anticoagulation vs indefinite, taking fall risk into account (home PT to help with strengthening exercises and gait training)    Home Health in place Equipment/Devices: none  Discharge Condition: stable CODE STATUS: FULL  Diet recommendation: Heart Healthy  Brief/Interim Summary: 11/12/19: to ED w/ SOB and weakness, Dx bilateral PE (CT angiogram of the chest showed acute PE with small moderate clot burden probably in the right lung filling defects. Also involving the distal main pulmonary artery. Also 2 pulmonary nodules), started on heparin gtt 11/13/19 - pt initially refused Echo but after assurance that there was no radiation involved she is amenable to this test.  Heparin gtt started, Day 1 11/14/19 - day 2 of heparin drip, PESI score class III, will continue heparin another 1-2 days and then she should be ok to go home, planning for ambulation w/ O2 monitoring tomorrow, PT saw her today recommends home health   11/15/19: Given high-risk PE, continuing with heparin gtt for today at least to give 3 days on gtt, possibly home tomorrow on Eliquis and w/ home health. Pending: ambulate w/ monitor SaO2, if patient doing well w/ this can d/c tomorrow, may need home O2 11/16/19 today: Patient feeling well, no concerns with SaO2 on ambulation with room air.  Okay to go home today.  Transition from heparin drip to p.o. Eliquis    Discharge Diagnoses:  Principal Problem:   Pulmonary embolism and infarction [facility]) Active Problems:   Primary hyperparathyroidism ([place])   Essential hypertension  GERD   Anxiety disorder, unspecified   Hyponatremia  Bilateral pulmonary embolism: Appears to be submassive especially in the right lung. Patient has been continues on heparin drip and has done well with ambulation Given high risk PE I left her on heparin gtt for total 3 days  Eliquis has already been approved for outpatient. Oharmacy confirmed dosing: 10 mg bid x10 days, then 5 mg bid   Essential hypertension: Amlodipine 2.5, benazepril 40   Hyponatremia: Patient has chronic hyponatremia. She probably is dehydrated also. Has been on saline, encourage hydration and recheck w/ PCP   Anxiety disorder:Continue home regimen.  GERD:Continue PPIs      Discharge Instructions  Discharge Instructions    Call MD for:  difficulty breathing, headache or visual disturbances   Complete by: As directed    Call MD for:  persistant dizziness or light-headedness   Complete by: As directed    Call MD for:  persistant nausea and vomiting   Complete by: As directed    Call MD for:  severe uncontrolled pain   Complete by: As directed    Call MD for:  temperature >100.4   Complete by: As directed    Diet - low sodium heart healthy   Complete by: As directed    Diet - low sodium heart healthy   Complete by: As directed    Discharge instructions   Complete by: As directed    Needs to follow up with primary care ASAP   Discharge instructions   Complete by: As directed    Patient needs to follow-up with her PCP  with in 1 to 2 weeks of discharge   Increase activity slowly   Complete by: As directed    Increase activity slowly   Complete by: As directed      Allergies as of 11/16/2019      Reactions   Codeine Nausea And Vomiting      Medication List    TAKE these medications   acetaminophen 500 MG tablet Commonly known as: TYLENOL Take 500 mg by mouth every 6 (six) hours as needed (arthritis pain).   amLODipine 5 MG tablet Commonly known as: NORVASC Take 1 tablet (5 mg  total) by mouth daily. What changed:   medication strength  additional instructions   apixaban 5 MG Tabs tablet Commonly known as: Eliquis Take 2 tablets (10 mg total) by mouth 2 (two) times daily for 10 days, THEN 1 tablet (5 mg total) 2 (two) times daily for 20 days. Start taking on: November 16, 2019   benazepril 40 MG tablet Commonly known as: LOTENSIN Take 1 tablet (40 mg total) by mouth daily.   furosemide 40 MG tablet Commonly known as: LASIX Take 1 tablet 3 times weekly   ibuprofen 200 MG tablet Commonly known as: ADVIL Take 2 tablets (400 mg total) by mouth daily as needed for moderate pain.   latanoprost 0.005 % ophthalmic solution Commonly known as: XALATAN INSTILL 1 DROP INTO BOTH EYES AT BEDTIME   LORazepam 0.5 MG tablet Commonly known as: ATIVAN Take 1 tablet (0.5 mg total) by mouth daily as needed for anxiety. for anxiety   meclizine 25 MG tablet Commonly known as: ANTIVERT Take 1 tablet (25 mg total) by mouth 4 (four) times daily as needed. What changed: reasons to take this   omeprazole 20 MG capsule Commonly known as: PRILOSEC Take 1 capsule (20 mg total) by mouth daily.   VITAMIN D PO Take 2,000 Units by mouth daily.      Follow-up Information    [facility] Follow up.   Specialty: Home Health Services Why: Agency will contact you to arrange for physical therapy and nurse visits.  Contact information: [place] [address] [phone]          Allergies  Allergen Reactions  . Codeine Nausea And Vomiting    Consultations:  None   Procedures/Studies: CT Angio Chest PE W and/or Wo Contrast  Result Date: 11/12/2019 CLINICAL DATA:  PE suspected, low/intermediate prob, positive D-dimer Hypoxia, shortness of breath, positive D-dimer. EXAM: CT ANGIOGRAPHY CHEST WITH CONTRAST TECHNIQUE: Multidetector CT imaging of the chest was performed using the standard protocol during bolus administration of  intravenous contrast. Multiplanar CT image reconstructions and MIPs were obtained to evaluate the vascular anatomy. CONTRAST:  29mL OMNIPAQUE IOHEXOL 350 MG/ML SOLN COMPARISON:  Radiograph earlier this day. FINDINGS: Cardiovascular: Positive acute PE with nonocclusive filling defects involving the distal main right pulmonary artery. Subsegmental filling defects in the right upper lobe, with lobar filling defects in the right middle and lower lobe. Additional segmental and subsegmental lower lobe emboli which are partially occlusive. Subsegmental emboli in the left upper and lower lobes. Thromboembolic burden is small to moderate. Straightening of the intraventricular septum with RV to LV ratio of 1.1. Mild contrast refluxing into the hepatic veins and IVC. Mild multi chamber cardiomegaly. Atherosclerosis of the thoracic aorta. No aortic dissection. Mediastinum/Nodes: No adenopathy. No esophageal wall thickening. No thyroid nodule. Lungs/Pleura: Breathing motion artifact partially obscures basilar evaluation. No pulmonary infarct. Linear opacity in the superior  segment of the left lower lobe and anterior left upper lobe may represent atelectasis or scarring. There is a 6 x 4 mm nodule in the right lower lobe (mean 5 mm), series 7, image 52. 4 mm perifissural nodule in the right lung same image. No pulmonary edema. No pleural fluid. Upper Abdomen: Contrast refluxes into the hepatic veins and IVC. No other acute findings in the upper abdomen. Musculoskeletal: Multilevel degenerative change in the thoracic spine. There are no acute or suspicious osseous abnormalities. Review of the MIP images confirms the above findings. IMPRESSION: 1. Positive acute PE. Small-moderate clot burden primarily in the right lung, filling defects in the distal main pulmonary artery extending into lobar and subsegmental branches. Additional subsegmental pulmonary emboli in the left upper and lower lobes. Evidence of right heart strain with RV  to LV ratio of 1.1. 2. Two pulmonary nodules in the right lung, largest measuring 5 mm. No follow-up needed if patient is low-risk (and has no known or suspected primary neoplasm). Non-contrast chest CT can be considered in 12 months if patient is high-risk. This recommendation follows the consensus statement: Guidelines for Management of Incidental Pulmonary Nodules Detected on CT Images: From the Fleischner Society 2017; Radiology 2017; 284:228-243. Aortic Atherosclerosis (ICD10-I70.0). Critical Value/emergent results were called by telephone at the time of interpretation on 11/12/2019 at 10:58 pm to Dr Sedonia Small, who verbally acknowledged these results. Electronically Signed   By: Keith Rake M.D.   On: 11/12/2019 22:59   DG Chest Portable 1 View  Result Date: 11/12/2019 CLINICAL DATA:  Hypoxia. EXAM: PORTABLE CHEST 1 VIEW COMPARISON:  08/14/2018. FINDINGS: Again noted is borderline cardiomegaly. The thoracic aorta is tortuous. There are aortic calcifications. There is no pneumothorax. No large pleural effusion. There is some atelectasis versus scarring at the lung bases. IMPRESSION: 1. Borderline cardiomegaly.  Bibasilar atelectasis versus scarring. 2. Aortic atherosclerosis. Electronically Signed   By: Constance Holster M.D.   On: 11/12/2019 16:12   ECHOCARDIOGRAM COMPLETE  Result Date: 11/13/2019   ECHOCARDIOGRAM REPORT   Patient Name:   ZANETA ORENSTEIN Date of Exam: 11/13/2019 Medical Rec #:  [id number]       Height:       63.0 in Accession #:    [id number]      Weight:       189.8 lb Date of Birth:  Jan 25, 1930      BSA:          1.89 m Patient Age:    44 years        BP:           137/73 mmHg Patient Gender: F               HR:           81 bpm. Exam Location:  Inpatient Procedure: 2D Echo Indications:    pulmonary embolus 415.19  History:        Patient has no prior history of Echocardiogram examinations.                 Arrythmias:Atrial Fibrillation; Risk Factors:Hypertension.  Sonographer:     Johny Chess Referring Phys: [place]  1. Left ventricular ejection fraction, by visual estimation, is 60 to 65%. The left ventricle has normal function. There is borderline left ventricular hypertrophy.  2. Left ventricular diastolic parameters are indeterminate.  3. The left ventricle has no regional wall motion abnormalities.  4. Global right ventricle has normal systolic function.The right ventricular  size is mildly enlarged. No increase in right ventricular wall thickness.  5. Left atrial size was normal.  6. Right atrial size was normal.  7. The mitral valve is grossly normal. Mild mitral valve regurgitation.  8. The tricuspid valve is grossly normal.  9. The aortic valve is tricuspid. Aortic valve regurgitation is not visualized. 10. The pulmonic valve was grossly normal. Pulmonic valve regurgitation is trivial. 11. Mildly elevated pulmonary artery systolic pressure. 12. The tricuspid regurgitant velocity is 2.80 m/s, and with an assumed right atrial pressure of 8 mmHg, the estimated right ventricular systolic pressure is mildly elevated at 39.4 mmHg. 13. The inferior vena cava is dilated in size with >50% respiratory variability, suggesting right atrial pressure of 8 mmHg. 14. Evidence of atrial level shunting detected by color flow Doppler. FINDINGS  Left Ventricle: Left ventricular ejection fraction, by visual estimation, is 60 to 65%. The left ventricle has normal function. The left ventricle has no regional wall motion abnormalities. The left ventricular internal cavity size was the left ventricle is normal in size. There is borderline left ventricular hypertrophy. Left ventricular diastolic parameters are indeterminate. Right Ventricle: The right ventricular size is mildly enlarged. No increase in right ventricular wall thickness. Global RV systolic function is has normal systolic function. The tricuspid regurgitant velocity is 2.80 m/s, and with an assumed right atrial   pressure of 8 mmHg, the estimated right ventricular systolic pressure is mildly elevated at 39.4 mmHg. Left Atrium: Left atrial size was normal in size. Right Atrium: Right atrial size was normal in size Pericardium: There is no evidence of pericardial effusion. Mitral Valve: The mitral valve is grossly normal. Mild mitral valve regurgitation. Tricuspid Valve: The tricuspid valve is grossly normal. Tricuspid valve regurgitation moderate-severe. Aortic Valve: The aortic valve is tricuspid. Aortic valve regurgitation is not visualized. Mild aortic valve annular calcification. Pulmonic Valve: The pulmonic valve was grossly normal. Pulmonic valve regurgitation is trivial. Pulmonic regurgitation is trivial. Aorta: The aortic root is normal in size and structure. Venous: The inferior vena cava is dilated in size with greater than 50% respiratory variability, suggesting right atrial pressure of 8 mmHg. IAS/Shunts: Evidence of atrial level shunting detected by color flow Doppler.  LEFT VENTRICLE PLAX 2D LVIDd:         3.60 cm LV PW:         1.00 cm LV IVS:        1.00 cm LVOT diam:     2.10 cm LVOT Area:     3.46 cm  LEFT ATRIUM             Index       RIGHT ATRIUM           Index LA Vol (A2C):   50.7 ml 26.81 ml/m RA Area:     16.60 cm LA Vol (A4C):   62.1 ml 32.84 ml/m RA Volume:   42.00 ml  22.21 ml/m LA Biplane Vol: 60.5 ml 31.99 ml/m   AORTA Ao Root diam: 3.10 cm TRICUSPID VALVE TR Peak grad:   31.4 mmHg TR Vmax:        280.00 cm/s  SHUNTS Systemic Diam: 2.10 cm  Rozann Lesches MD Electronically signed by Rozann Lesches MD Signature Date/Time: 11/13/2019/2:53:05 PM    Final     (Echo, Carotid, EGD, Colonoscopy, ERCP)    Subjective:   Discharge Exam: Vitals:   11/15/19 2131 11/16/19 0552  BP:  (!) 144/84  Pulse: 68 78  Resp: (!) 22 15  Temp:  97.8 F (36.6 C)  SpO2: 92% 96%   Vitals:   11/15/19 1939 11/15/19 2131 11/16/19 0552 11/16/19 0613  BP: 123/76  (!) 144/84   Pulse: 72 68 78   Resp:  (!) 21 (!) 22 15   Temp: 97.7 F (36.5 C)  97.8 F (36.6 C)   TempSrc: Oral  Oral   SpO2: 93% 92% 96%   Weight:    87.1 kg  Height:        General: Pt is alert, awake, not in acute distress Cardiovascular: RRR, S1/S2 +, no rubs, no gallops Respiratory: CTA bilaterally, no wheezing, no rhonchi Abdominal: Soft, NT, ND, bowel sounds + Extremities: no edema, no cyanosis    The results of significant diagnostics from this hospitalization (including imaging, microbiology, ancillary and laboratory) are listed below for reference.     Microbiology: Recent Results (from the past 240 hour(s))  SARS CORONAVIRUS 2 (TAT 6-24 HRS) Nasopharyngeal Nasopharyngeal Swab     Status: None   Collection Time: 11/12/19  5:57 PM   Specimen: Nasopharyngeal Swab  Result Value Ref Range Status   SARS Coronavirus 2 NEGATIVE NEGATIVE Final    Comment: (NOTE) SARS-CoV-2 target nucleic acids are NOT DETECTED. The SARS-CoV-2 RNA is generally detectable in upper and lower respiratory specimens during the acute phase of infection. Negative results do not preclude SARS-CoV-2 infection, do not rule out co-infections with other pathogens, and should not be used as the sole basis for treatment or other patient management decisions. Negative results must be combined with clinical observations, patient history, and epidemiological information. The expected result is Negative. Fact Sheet for Patients: SugarRoll.be Fact Sheet for Healthcare Providers: https://www.woods-mathews.com/ This test is not yet approved or cleared by the [place] FDA and  has been authorized for detection and/or diagnosis of SARS-CoV-2 by FDA under an Emergency Use Authorization (EUA). This EUA will remain  in effect (meaning this test can be used) for the duration of the COVID-19 declaration under Section 56 4(b)(1) of the Act, 21 U.S.C. section 360bbb-3(b)(1), unless the authorization is  terminated or revoked sooner. Performed at [facility], [place] [address] [place], [place] [address]      Labs: BNP (last 3 results) Recent Labs    11/12/19 1606  BNP [id number]*   Basic Metabolic Panel: Recent Labs  Lab 11/12/19 1606 11/13/19 0740 11/14/19 1639 11/15/19 0842 11/16/19 0659  NA 123* 125* 128* 135 135  K 4.2 4.2 4.4 4.5 4.7  CL 88* 91* 94* 103 102  CO2 25 25 27 27 27   GLUCOSE 107* 160* 106* 104* 96  BUN 16 14 15 14 13   CREATININE 0.95 0.87 1.10* 0.97 1.06*  CALCIUM 10.9* 10.8* 10.7* 10.5* 10.8*  MG 1.8  --   --   --   --    Liver Function Tests: Recent Labs  Lab 11/12/19 1606 11/13/19 0740  AST 37 27  ALT 37 34  ALKPHOS 77 72  BILITOT 0.9 0.9  PROT 6.3* 6.0*  ALBUMIN 3.9 3.6   No results for input(s): LIPASE, AMYLASE in the last 168 hours. No results for input(s): AMMONIA in the last 168 hours. CBC: Recent Labs  Lab 11/12/19 1606 11/13/19 0740 11/14/19 0555 11/15/19 0842 11/16/19 0659  WBC 6.6 6.8 8.4 6.7 6.5  NEUTROABS 4.8  --   --   --   --   HGB 14.3 13.9 12.1 13.3 14.1  HCT 42.7 39.5 36.7 41.2 44.0  MCV 87.5 84.4 88.9 90.4  91.7  PLT 234 237 220 230 244   Cardiac Enzymes: No results for input(s): CKTOTAL, CKMB, CKMBINDEX, TROPONINI in the last 168 hours. BNP: Invalid input(s): POCBNP CBG: No results for input(s): GLUCAP in the last 168 hours. D-Dimer No results for input(s): DDIMER in the last 72 hours. Hgb A1c No results for input(s): HGBA1C in the last 72 hours. Lipid Profile No results for input(s): CHOL, HDL, LDLCALC, TRIG, CHOLHDL, LDLDIRECT in the last 72 hours. Thyroid function studies No results for input(s): TSH, T4TOTAL, T3FREE, THYROIDAB in the last 72 hours.  Invalid input(s): FREET3 Anemia work up No results for input(s): VITAMINB12, FOLATE, FERRITIN, TIBC, IRON, RETICCTPCT in the last 72 hours. Urinalysis    Component Value Date/Time   COLORURINE YELLOW 08/14/2018 1723   APPEARANCEUR CLEAR 08/14/2018  1723   LABSPEC 1.013 08/14/2018 1723   PHURINE 5.0 08/14/2018 1723   GLUCOSEU NEGATIVE 08/14/2018 1723   HGBUR NEGATIVE 08/14/2018 1723   BILIRUBINUR NEGATIVE 08/14/2018 1723   KETONESUR NEGATIVE 08/14/2018 1723   PROTEINUR NEGATIVE 08/14/2018 1723   NITRITE NEGATIVE 08/14/2018 1723   LEUKOCYTESUR NEGATIVE 08/14/2018 1723   Sepsis Labs Invalid input(s): PROCALCITONIN,  WBC,  LACTICIDVEN Microbiology Recent Results (from the past 240 hour(s))  SARS CORONAVIRUS 2 (TAT 6-24 HRS) Nasopharyngeal Nasopharyngeal Swab     Status: None   Collection Time: 11/12/19  5:57 PM   Specimen: Nasopharyngeal Swab  Result Value Ref Range Status   SARS Coronavirus 2 NEGATIVE NEGATIVE Final    Comment: (NOTE) SARS-CoV-2 target nucleic acids are NOT DETECTED. The SARS-CoV-2 RNA is generally detectable in upper and lower respiratory specimens during the acute phase of infection. Negative results do not preclude SARS-CoV-2 infection, do not rule out co-infections with other pathogens, and should not be used as the sole basis for treatment or other patient management decisions. Negative results must be combined with clinical observations, patient history, and epidemiological information. The expected result is Negative. Fact Sheet for Patients: SugarRoll.be Fact Sheet for Healthcare Providers: https://www.woods-mathews.com/ This test is not yet approved or cleared by the [place] FDA and  has been authorized for detection and/or diagnosis of SARS-CoV-2 by FDA under an Emergency Use Authorization (EUA). This EUA will remain  in effect (meaning this test can be used) for the duration of the COVID-19 declaration under Section 56 4(b)(1) of the Act, 21 U.S.C. section 360bbb-3(b)(1), unless the authorization is terminated or revoked sooner. Performed at [facility], [place] [address], [place], [place] [address]      Time coordinating discharge: Over 30  minutes  SIGNED:   Emeterio Reeve, DO [facility] 11/16/2019, 1:30 PM

## 2019-11-17 ENCOUNTER — Telehealth: Payer: Self-pay

## 2019-11-17 DIAGNOSIS — M8580 Other specified disorders of bone density and structure, unspecified site: Secondary | ICD-10-CM | POA: Diagnosis not present

## 2019-11-17 DIAGNOSIS — I700 Atherosclerosis of aorta: Secondary | ICD-10-CM | POA: Diagnosis not present

## 2019-11-17 DIAGNOSIS — M47814 Spondylosis without myelopathy or radiculopathy, thoracic region: Secondary | ICD-10-CM | POA: Diagnosis not present

## 2019-11-17 DIAGNOSIS — K219 Gastro-esophageal reflux disease without esophagitis: Secondary | ICD-10-CM | POA: Diagnosis not present

## 2019-11-17 DIAGNOSIS — E871 Hypo-osmolality and hyponatremia: Secondary | ICD-10-CM | POA: Diagnosis not present

## 2019-11-17 DIAGNOSIS — F419 Anxiety disorder, unspecified: Secondary | ICD-10-CM | POA: Diagnosis not present

## 2019-11-17 DIAGNOSIS — I119 Hypertensive heart disease without heart failure: Secondary | ICD-10-CM | POA: Diagnosis not present

## 2019-11-17 DIAGNOSIS — E210 Primary hyperparathyroidism: Secondary | ICD-10-CM | POA: Diagnosis not present

## 2019-11-17 DIAGNOSIS — I2699 Other pulmonary embolism without acute cor pulmonale: Secondary | ICD-10-CM | POA: Diagnosis not present

## 2019-11-17 NOTE — Telephone Encounter (Signed)
Transition Care Management Follow-up Telephone Call  Date of discharge and from where: 11/16/19 from [facility]  How have you been since you were released from the hospital? "I'm doing alright now".  Any questions or concerns? Yes ; addressed concerns about blood pressure and blood thinner medications.  Items Reviewed:  Did the pt receive and understand the discharge instructions provided? Yes   Medications obtained and verified? Yes   Any new allergies since your discharge? No   Dietary orders reviewed? Yes  Do you have support at home? Yes ; son lives with her and apparently works 2nd shift hours.  Other (ie: DME, Home Health, etc) yes; PT came today to admit and offering home health aid assistance for bathing.  Functional Questionnaire: (I = Independent and D = Dependent) ADL's: She is able to perform most ADL's independently but needs assistance with bathing. Home health is arranging help from a home health aid.   Bathing/Dressing- needs assistance with bathing.   Meal Prep- eating quick fix meals  Eating- independent  Maintaining continence- independent  Transferring/Ambulation- independent  Managing Meds- requires assistance from son   Follow up appointments reviewed:    PCP Hospital f/u appt confirmed? Yes  Scheduled to see Dr. Martinique 11/20/19 7:00am with labs.  [facility] f/u appt confirmed? No  She states that she is not aware of any other appointments.   Are transportation arrangements needed? No   If their condition worsens, is the pt aware to call  their PCP or go to the ED? Yes  Was the patient provided with contact information for the PCP's office or ED? Yes  Was the pt encouraged to call back with questions or concerns? Yes

## 2019-11-18 ENCOUNTER — Telehealth: Payer: Self-pay | Admitting: *Deleted

## 2019-11-18 ENCOUNTER — Other Ambulatory Visit: Payer: Self-pay | Admitting: Family Medicine

## 2019-11-18 DIAGNOSIS — M8580 Other specified disorders of bone density and structure, unspecified site: Secondary | ICD-10-CM | POA: Diagnosis not present

## 2019-11-18 DIAGNOSIS — I700 Atherosclerosis of aorta: Secondary | ICD-10-CM | POA: Diagnosis not present

## 2019-11-18 DIAGNOSIS — I119 Hypertensive heart disease without heart failure: Secondary | ICD-10-CM | POA: Diagnosis not present

## 2019-11-18 DIAGNOSIS — E871 Hypo-osmolality and hyponatremia: Secondary | ICD-10-CM | POA: Diagnosis not present

## 2019-11-18 DIAGNOSIS — E210 Primary hyperparathyroidism: Secondary | ICD-10-CM | POA: Diagnosis not present

## 2019-11-18 DIAGNOSIS — K219 Gastro-esophageal reflux disease without esophagitis: Secondary | ICD-10-CM | POA: Diagnosis not present

## 2019-11-18 DIAGNOSIS — F419 Anxiety disorder, unspecified: Secondary | ICD-10-CM | POA: Diagnosis not present

## 2019-11-18 DIAGNOSIS — M47814 Spondylosis without myelopathy or radiculopathy, thoracic region: Secondary | ICD-10-CM | POA: Diagnosis not present

## 2019-11-18 DIAGNOSIS — I2699 Other pulmonary embolism without acute cor pulmonale: Secondary | ICD-10-CM | POA: Diagnosis not present

## 2019-11-18 MED ORDER — APIXABAN 5 MG PO TABS: ORAL_TABLET | ORAL | 0 refills | Status: DC

## 2019-11-18 NOTE — Telephone Encounter (Signed)
Patient called after hours line. Patient reports She has blood on her lungs. She takes blood thinners. Medication was to be sent to [facility], but she gets her medication via [place] in mail. Caller just wants to notify office.

## 2019-11-18 NOTE — Telephone Encounter (Signed)
I sent Rx for Eliquis 5 mg to take 10 mg bid x 7 days then 5 mg bid. Stop Ibuprofen or other NSAID's, may increase the risk for bleeding. Thanks, BJ

## 2019-11-18 NOTE — Telephone Encounter (Signed)
I called and spoke with pt. She is aware why the Eliquis was sent to [facility]. Pt will not take any ibuprofen or any other nsaids; plans to be here for appointment Friday morning, weather pending.

## 2019-11-19 ENCOUNTER — Other Ambulatory Visit: Payer: Self-pay

## 2019-11-19 DIAGNOSIS — I119 Hypertensive heart disease without heart failure: Secondary | ICD-10-CM | POA: Diagnosis not present

## 2019-11-19 DIAGNOSIS — M8580 Other specified disorders of bone density and structure, unspecified site: Secondary | ICD-10-CM | POA: Diagnosis not present

## 2019-11-19 DIAGNOSIS — I2699 Other pulmonary embolism without acute cor pulmonale: Secondary | ICD-10-CM | POA: Diagnosis not present

## 2019-11-19 DIAGNOSIS — E210 Primary hyperparathyroidism: Secondary | ICD-10-CM | POA: Diagnosis not present

## 2019-11-19 DIAGNOSIS — E871 Hypo-osmolality and hyponatremia: Secondary | ICD-10-CM | POA: Diagnosis not present

## 2019-11-19 DIAGNOSIS — K219 Gastro-esophageal reflux disease without esophagitis: Secondary | ICD-10-CM | POA: Diagnosis not present

## 2019-11-19 DIAGNOSIS — F419 Anxiety disorder, unspecified: Secondary | ICD-10-CM | POA: Diagnosis not present

## 2019-11-19 DIAGNOSIS — I700 Atherosclerosis of aorta: Secondary | ICD-10-CM | POA: Diagnosis not present

## 2019-11-19 DIAGNOSIS — M47814 Spondylosis without myelopathy or radiculopathy, thoracic region: Secondary | ICD-10-CM | POA: Diagnosis not present

## 2019-11-19 NOTE — Patient Outreach (Signed)
[facility]) Care Management  11/19/2019  Annette [name] 19-Mar-1930 [id number]    EMMI-General Discharge RED ON EMMI ALERT Day # 1 Date: 11/18/2019 Red Alert Reason: " Got discharge papers? I don't know"   Outreach attempt #1 to patient. Spoke with patient who denies any acute issues or concerns at present. Reviewed and addressed red alert with patient. She reports error in response as she did not realize her paperwork was called discharge paperwork" She states that she remembers it being called something else a long time ago when she was last in the hospital. She voices that her son has paperwork and has reviewed the info with her. Patient reports that HHPT should be there shortly and she has "two nurses" coming later this afternoon.  She voices that she is getting a lot of calls and has a lot of people helping her out right now and does not want to get confused with having anyone else calling her. RN CM did advise patient that she would receive one more automated post discharge call as she was okay with this.      Plan: RN CM will close case at this time.   Enzo Montgomery, RN,BSN,CCM [facility] Telephonic Care Management Coordinator Direct Phone: [phone] Toll Free: [phone] Fax: [phone]

## 2019-11-20 ENCOUNTER — Encounter: Payer: Self-pay | Admitting: Family Medicine

## 2019-11-20 ENCOUNTER — Other Ambulatory Visit: Payer: Self-pay

## 2019-11-20 ENCOUNTER — Ambulatory Visit (INDEPENDENT_AMBULATORY_CARE_PROVIDER_SITE_OTHER): Payer: Medicare HMO | Admitting: Family Medicine

## 2019-11-20 VITALS — BP 140/80 | HR 98 | Temp 96.4°F | Resp 16 | Ht 63.0 in

## 2019-11-20 DIAGNOSIS — R918 Other nonspecific abnormal finding of lung field: Secondary | ICD-10-CM

## 2019-11-20 DIAGNOSIS — F419 Anxiety disorder, unspecified: Secondary | ICD-10-CM | POA: Diagnosis not present

## 2019-11-20 DIAGNOSIS — I10 Essential (primary) hypertension: Secondary | ICD-10-CM

## 2019-11-20 DIAGNOSIS — I2609 Other pulmonary embolism with acute cor pulmonale: Secondary | ICD-10-CM | POA: Insufficient documentation

## 2019-11-20 DIAGNOSIS — E210 Primary hyperparathyroidism: Secondary | ICD-10-CM

## 2019-11-20 LAB — CBC
HCT: 45 % (ref 36.0–46.0)
Hemoglobin: 15.2 g/dL — ABNORMAL HIGH (ref 12.0–15.0)
MCHC: 33.7 g/dL (ref 30.0–36.0)
MCV: 88.6 fl (ref 78.0–100.0)
Platelets: 261 10*3/uL (ref 150.0–400.0)
RBC: 5.08 Mil/uL (ref 3.87–5.11)
RDW: 14.3 % (ref 11.5–15.5)
WBC: 7.6 10*3/uL (ref 4.0–10.5)

## 2019-11-20 LAB — BASIC METABOLIC PANEL
BUN: 14 mg/dL (ref 6–23)
CO2: 23 mEq/L (ref 19–32)
Calcium: 11.4 mg/dL — ABNORMAL HIGH (ref 8.4–10.5)
Chloride: 99 mEq/L (ref 96–112)
Creatinine, Ser: 0.87 mg/dL (ref 0.40–1.20)
GFR: 61.58 mL/min (ref >60.00)
Glucose, Bld: 96 mg/dL (ref 70–99)
Potassium: 4.7 mEq/L (ref 3.5–5.1)
Sodium: 132 mEq/L — ABNORMAL LOW (ref 135–145)

## 2019-11-20 MED ORDER — ONE FLOW SPIROMETER DEVI: 1.0000 | Freq: Three times a day (TID) | 0 refills | Status: DC | PRN

## 2019-11-20 NOTE — Progress Notes (Signed)
HPI:   Annette [name] is a 86 y.o. female, who is here today with her son to follow on recent hospitalization.  She presented to the ER due to worsening dyspnea and weakness. Dx'ed with PE. Admitted on 11/12/2019 and discharged on 11/16/2019.  CXR on  1. Borderline cardiomegaly.  Bibasilar atelectasis versus scarring. 2. Aortic atherosclerosis.  Chest CTA: 1. Positive acute PE. Small-moderate clot burden primarily in the right lung, filling defects in the distal main pulmonary artery extending into lobar and subsegmental branches.  Additional subsegmental pulmonary emboli in the left upper and lower lobes. Evidence of right heart strain with RV to LV ratio of 1.1. 2. Two pulmonary nodules in the right lung, largest measuring 5 mm. No follow-up needed if patient is low-risk (and has no known or suspected primary neoplasm).  No apparent precipitating factor. She mentions that about a month ago she fell, she was trying to put on a heavy poncho,lost balance and fell on her left side. Fell  against a wooden chair,ache left costal and chest wall pain for a 2-3 weeks. States that she did not reported fall because she was ashame of the way she did.  Treated with 2 days of heparin drip during hospitalization and discharge on Eliquis, currently she is taking 10 mg bid. She is checking pulse ox at home, 97%. Lab Results  Component Value Date   WBC 6.5 11/16/2019   HGB 14.1 11/16/2019   HCT 44.0 11/16/2019   MCV 91.7 11/16/2019   PLT 244 11/16/2019   Progressively getting better. Denies dyspnea,CP,palpitations,PND,orthopnea,and worsening LE edema. Tolerating Eliquis well,reporting easy bruising . She has not noted nose/gum bleed, blood in stool,or melena.  HTN: She is on Amlodipine 5 mg daily and Benazepril 40 mg daily. Tolerating mediation well. No unusual or frequent headache or visual changes.  Lab Results  Component Value Date   CREATININE 1.06 (H) 11/16/2019   BUN 13  11/16/2019   NA 135 11/16/2019   K 4.7 11/16/2019   CL 102 11/16/2019   CO2 27 11/16/2019   HH has been arranged, PT once per week. She is eating well.  His son is reporting episode of confusion during hospitalization and hallucination x 1 night. She saw a dog in the room. Son was concerned about dementia.  Hyperparathyroidism, Ca++ was 10.8 on 11/16/2019  + Vit D deficiency, last 25 OH vit D 38.92 in 07/2019.  Review of Systems  Constitutional: Positive for fatigue. Negative for activity change, appetite change and fever.  HENT: Negative for mouth sores and sore throat.   Respiratory: Negative for cough and wheezing.   Gastrointestinal: Negative for abdominal pain, nausea and vomiting.       Negative for changes in bowel habits.  Genitourinary: Negative for decreased urine volume, dysuria and hematuria.  Musculoskeletal: Positive for arthralgias and gait problem.  Neurological: Negative for syncope, facial asymmetry and weakness.  Rest see pertinent positives and negatives per HPI.   Current Outpatient Medications on File Prior to Visit  Medication Sig Dispense Refill  . acetaminophen (TYLENOL) 500 MG tablet Take 500 mg by mouth every 6 (six) hours as needed (arthritis pain).    Marland Kitchen amLODipine (NORVASC) 5 MG tablet Take 1 tablet (5 mg total) by mouth daily. 30 tablet 0  . apixaban (ELIQUIS) 5 MG TABS tablet Take 2 tablets (10 mg total) by mouth 2 (two) times daily for 10 days, THEN 1 tablet (5 mg total) 2 (two) times daily for 20  days. 80 tablet 0  . apixaban (ELIQUIS) 5 MG TABS tablet 2 tabs (10 mg) bid x 7 days then continue 1 tab bid. 60 tablet 0  . benazepril (LOTENSIN) 40 MG tablet Take 1 tablet (40 mg total) by mouth daily. 90 tablet 3  . furosemide (LASIX) 40 MG tablet Take 1 tablet 3 times weekly 30 tablet 3  . latanoprost (XALATAN) 0.005 % ophthalmic solution INSTILL 1 DROP INTO BOTH EYES AT BEDTIME (Patient taking differently: Place 1 drop into both eyes at bedtime. ) 7.5 mL  3  . LORazepam (ATIVAN) 0.5 MG tablet Take 1 tablet (0.5 mg total) by mouth daily as needed for anxiety. for anxiety 30 tablet 0  . meclizine (ANTIVERT) 25 MG tablet Take 1 tablet (25 mg total) by mouth 4 (four) times daily as needed. (Patient taking differently: Take 25 mg by mouth 4 (four) times daily as needed for dizziness. ) 90 tablet 3  . omeprazole (PRILOSEC) 20 MG capsule Take 1 capsule (20 mg total) by mouth daily. 90 capsule 3  . VITAMIN D PO Take 2,000 Units by mouth daily.      No current facility-administered medications on file prior to visit.     Past Medical History:  Diagnosis Date  . GERD 10/03/2007  . HYPERTENSION 10/03/2007  . OSTEOPENIA 02/03/2008  . Primary hyperparathyroidism ([place]) 10/03/2007   Allergies  Allergen Reactions  . Codeine Nausea And Vomiting    Social History   Socioeconomic History  . Marital status: Widowed    Spouse name: Not on file  . Number of children: Not on file  . Years of education: Not on file  . Highest education level: Not on file  Occupational History  . Not on file  Tobacco Use  . Smoking status: Former Smoker    Quit date: 11/12/1978    Years since quitting: 41.0  . Smokeless tobacco: Never Used  Substance and Sexual Activity  . Alcohol use: No  . Drug use: No  . Sexual activity: Not on file  Other Topics Concern  . Not on file  Social History Narrative  . Not on file   Social Determinants of Health   Financial Resource Strain:   . Difficulty of Paying Living Expenses: Not on file  Food Insecurity:   . Worried About Charity fundraiser in the Last Year: Not on file  . Ran Out of Food in the Last Year: Not on file  Transportation Needs:   . Lack of Transportation (Medical): Not on file  . Lack of Transportation (Non-Medical): Not on file  Physical Activity:   . Days of Exercise per Week: Not on file  . Minutes of Exercise per Session: Not on file  Stress:   . Feeling of Stress : Not on file  Social  Connections:   . Frequency of Communication with Friends and Family: Not on file  . Frequency of Social Gatherings with Friends and Family: Not on file  . Attends Religious Services: Not on file  . Active Member of Clubs or Organizations: Not on file  . Attends Archivist Meetings: Not on file  . Marital Status: Not on file    Vitals:   11/20/19 0705  BP: 140/80  Pulse: 98  Resp: 16  Temp: (!) 96.4 F (35.8 C)  SpO2: 91%   Body mass index is 34.03 kg/m.   Physical Exam  Nursing note and vitals reviewed. Constitutional: She is oriented to person, place, and time. She  appears well-developed. No distress.  HENT:  Head: Normocephalic and atraumatic.  Mouth/Throat: Oropharynx is clear and moist and mucous membranes are normal.  Eyes: Pupils are equal, round, and reactive to light. Conjunctivae are normal.  Cardiovascular: Normal rate. An irregular rhythm present.  Occasional extrasystoles are present.  No murmur heard. Pulses:      Dorsalis pedis pulses are 2+ on the right side and 2+ on the left side.  Respiratory: Effort normal and breath sounds normal. No respiratory distress.  GI: Soft. She exhibits no mass. There is no hepatomegaly. There is no abdominal tenderness.  Musculoskeletal:        General: Edema (1+ pitting LE edema,bilateral.) present.  Lymphadenopathy:    She has no cervical adenopathy.  Neurological: She is alert and oriented to person, place, and time. She has normal strength. No cranial nerve deficit. Gait abnormal.  Skin: Skin is warm. No rash noted. No erythema.  Psychiatric: Her mood appears anxious.  Well groomed, good eye contact.    ASSESSMENT AND PLAN:  Annette [name] was seen today for hypertension.  Diagnoses and all orders for this visit:  Orders Placed This Encounter  Procedures  . Basic Metabolic Panel  . CBC   Lab Results  Component Value Date   WBC 7.6 11/20/2019   HGB 15.2 (H) 11/20/2019   HCT 45.0 11/20/2019   MCV 88.6  11/20/2019   PLT 261.0 11/20/2019   Lab Results  Component Value Date   CREATININE 0.87 11/20/2019   BUN 14 11/20/2019   NA 132 (L) 11/20/2019   K 4.7 11/20/2019   CL 99 11/20/2019   CO2 23 11/20/2019    Other acute pulmonary embolism with acute cor pulmonale (HCC) We discussed prognosis and side effects of anticoagulation. Chest trauma could have been contributed to problem (?). Others to consider given her age, malignancy. For now we will plan on 6 months of anticoagulation. Instructed about warning signs.  Primary hyperparathyroidism ([place]) Problem has been stable. We discussed other possible etiologies of hyperCa++.  Essential hypertension BP adequately controlled. Continue current management. Her son will continue monitoring her BP regularly.  Anxiety disorder, unspecified type Mildly worse due to recent health concerns. No changes in Lorazepam,side effects discussed.  Pulmonary nodules Former smoker. We will discuss options, could repeat chest CT in 12 months.  Other orders -     Respiratory Therapy Supplies (ONE FLOW SPIROMETER) DEVI; 1 Device by Does not apply route 3 (three) times daily as needed.   In regard to episode of confusion while hospitalized, this could be an episode of delirium. Today she is oriented x 3. We will continue monitoring.  Return in about 4 weeks (around 12/18/2019).   Jozelyn Kuwahara G. [place], MD  [facility]. [place] office.

## 2019-11-20 NOTE — Patient Instructions (Addendum)
A few things to remember from today's visit:   Other acute pulmonary embolism with acute cor pulmonale (HCC) - Plan: Basic Metabolic Panel, CBC  Primary hyperparathyroidism ([place]), Chronic  Essential hypertension  No changes today. Miralax daily at night for constipation. Adequate fiber and fluid intake.  Be careful with falls.  Please be sure medication list is accurate. If a new problem present, please set up appointment sooner than planned today.

## 2019-11-23 DIAGNOSIS — M47814 Spondylosis without myelopathy or radiculopathy, thoracic region: Secondary | ICD-10-CM | POA: Diagnosis not present

## 2019-11-23 DIAGNOSIS — K219 Gastro-esophageal reflux disease without esophagitis: Secondary | ICD-10-CM | POA: Diagnosis not present

## 2019-11-23 DIAGNOSIS — F419 Anxiety disorder, unspecified: Secondary | ICD-10-CM | POA: Diagnosis not present

## 2019-11-23 DIAGNOSIS — M8580 Other specified disorders of bone density and structure, unspecified site: Secondary | ICD-10-CM | POA: Diagnosis not present

## 2019-11-23 DIAGNOSIS — I700 Atherosclerosis of aorta: Secondary | ICD-10-CM | POA: Diagnosis not present

## 2019-11-23 DIAGNOSIS — I2699 Other pulmonary embolism without acute cor pulmonale: Secondary | ICD-10-CM | POA: Diagnosis not present

## 2019-11-23 DIAGNOSIS — E210 Primary hyperparathyroidism: Secondary | ICD-10-CM | POA: Diagnosis not present

## 2019-11-23 DIAGNOSIS — I119 Hypertensive heart disease without heart failure: Secondary | ICD-10-CM | POA: Diagnosis not present

## 2019-11-23 DIAGNOSIS — E871 Hypo-osmolality and hyponatremia: Secondary | ICD-10-CM | POA: Diagnosis not present

## 2019-11-24 DIAGNOSIS — I700 Atherosclerosis of aorta: Secondary | ICD-10-CM | POA: Diagnosis not present

## 2019-11-24 DIAGNOSIS — K219 Gastro-esophageal reflux disease without esophagitis: Secondary | ICD-10-CM | POA: Diagnosis not present

## 2019-11-24 DIAGNOSIS — I119 Hypertensive heart disease without heart failure: Secondary | ICD-10-CM | POA: Diagnosis not present

## 2019-11-24 DIAGNOSIS — E210 Primary hyperparathyroidism: Secondary | ICD-10-CM | POA: Diagnosis not present

## 2019-11-24 DIAGNOSIS — F419 Anxiety disorder, unspecified: Secondary | ICD-10-CM | POA: Diagnosis not present

## 2019-11-24 DIAGNOSIS — M47814 Spondylosis without myelopathy or radiculopathy, thoracic region: Secondary | ICD-10-CM | POA: Diagnosis not present

## 2019-11-24 DIAGNOSIS — I2699 Other pulmonary embolism without acute cor pulmonale: Secondary | ICD-10-CM | POA: Diagnosis not present

## 2019-11-24 DIAGNOSIS — M8580 Other specified disorders of bone density and structure, unspecified site: Secondary | ICD-10-CM | POA: Diagnosis not present

## 2019-11-24 DIAGNOSIS — E871 Hypo-osmolality and hyponatremia: Secondary | ICD-10-CM | POA: Diagnosis not present

## 2019-11-26 ENCOUNTER — Encounter: Payer: Self-pay | Admitting: Family Medicine

## 2019-11-26 DIAGNOSIS — K219 Gastro-esophageal reflux disease without esophagitis: Secondary | ICD-10-CM | POA: Diagnosis not present

## 2019-11-26 DIAGNOSIS — I2699 Other pulmonary embolism without acute cor pulmonale: Secondary | ICD-10-CM | POA: Diagnosis not present

## 2019-11-26 DIAGNOSIS — I119 Hypertensive heart disease without heart failure: Secondary | ICD-10-CM | POA: Diagnosis not present

## 2019-11-26 DIAGNOSIS — I700 Atherosclerosis of aorta: Secondary | ICD-10-CM | POA: Diagnosis not present

## 2019-11-26 DIAGNOSIS — M8580 Other specified disorders of bone density and structure, unspecified site: Secondary | ICD-10-CM | POA: Diagnosis not present

## 2019-11-26 DIAGNOSIS — E871 Hypo-osmolality and hyponatremia: Secondary | ICD-10-CM | POA: Diagnosis not present

## 2019-11-26 DIAGNOSIS — F419 Anxiety disorder, unspecified: Secondary | ICD-10-CM | POA: Diagnosis not present

## 2019-11-26 DIAGNOSIS — M47814 Spondylosis without myelopathy or radiculopathy, thoracic region: Secondary | ICD-10-CM | POA: Diagnosis not present

## 2019-11-26 DIAGNOSIS — E210 Primary hyperparathyroidism: Secondary | ICD-10-CM | POA: Diagnosis not present

## 2019-11-27 ENCOUNTER — Encounter: Payer: Self-pay | Admitting: Family Medicine

## 2019-11-28 ENCOUNTER — Encounter: Payer: Self-pay | Admitting: Family Medicine

## 2019-11-30 DIAGNOSIS — M47814 Spondylosis without myelopathy or radiculopathy, thoracic region: Secondary | ICD-10-CM | POA: Diagnosis not present

## 2019-11-30 DIAGNOSIS — I2699 Other pulmonary embolism without acute cor pulmonale: Secondary | ICD-10-CM | POA: Diagnosis not present

## 2019-11-30 DIAGNOSIS — M8580 Other specified disorders of bone density and structure, unspecified site: Secondary | ICD-10-CM | POA: Diagnosis not present

## 2019-11-30 DIAGNOSIS — F419 Anxiety disorder, unspecified: Secondary | ICD-10-CM | POA: Diagnosis not present

## 2019-11-30 DIAGNOSIS — E871 Hypo-osmolality and hyponatremia: Secondary | ICD-10-CM | POA: Diagnosis not present

## 2019-11-30 DIAGNOSIS — E210 Primary hyperparathyroidism: Secondary | ICD-10-CM | POA: Diagnosis not present

## 2019-11-30 DIAGNOSIS — I119 Hypertensive heart disease without heart failure: Secondary | ICD-10-CM | POA: Diagnosis not present

## 2019-11-30 DIAGNOSIS — I700 Atherosclerosis of aorta: Secondary | ICD-10-CM | POA: Diagnosis not present

## 2019-11-30 DIAGNOSIS — K219 Gastro-esophageal reflux disease without esophagitis: Secondary | ICD-10-CM | POA: Diagnosis not present

## 2019-12-03 DIAGNOSIS — I2699 Other pulmonary embolism without acute cor pulmonale: Secondary | ICD-10-CM | POA: Diagnosis not present

## 2019-12-03 DIAGNOSIS — I700 Atherosclerosis of aorta: Secondary | ICD-10-CM | POA: Diagnosis not present

## 2019-12-03 DIAGNOSIS — I119 Hypertensive heart disease without heart failure: Secondary | ICD-10-CM | POA: Diagnosis not present

## 2019-12-03 DIAGNOSIS — M47814 Spondylosis without myelopathy or radiculopathy, thoracic region: Secondary | ICD-10-CM | POA: Diagnosis not present

## 2019-12-03 DIAGNOSIS — K219 Gastro-esophageal reflux disease without esophagitis: Secondary | ICD-10-CM | POA: Diagnosis not present

## 2019-12-03 DIAGNOSIS — E210 Primary hyperparathyroidism: Secondary | ICD-10-CM | POA: Diagnosis not present

## 2019-12-03 DIAGNOSIS — F419 Anxiety disorder, unspecified: Secondary | ICD-10-CM | POA: Diagnosis not present

## 2019-12-03 DIAGNOSIS — E871 Hypo-osmolality and hyponatremia: Secondary | ICD-10-CM | POA: Diagnosis not present

## 2019-12-03 DIAGNOSIS — M8580 Other specified disorders of bone density and structure, unspecified site: Secondary | ICD-10-CM | POA: Diagnosis not present

## 2019-12-04 DIAGNOSIS — K219 Gastro-esophageal reflux disease without esophagitis: Secondary | ICD-10-CM | POA: Diagnosis not present

## 2019-12-04 DIAGNOSIS — F419 Anxiety disorder, unspecified: Secondary | ICD-10-CM | POA: Diagnosis not present

## 2019-12-04 DIAGNOSIS — I119 Hypertensive heart disease without heart failure: Secondary | ICD-10-CM | POA: Diagnosis not present

## 2019-12-04 DIAGNOSIS — I2699 Other pulmonary embolism without acute cor pulmonale: Secondary | ICD-10-CM | POA: Diagnosis not present

## 2019-12-04 DIAGNOSIS — E871 Hypo-osmolality and hyponatremia: Secondary | ICD-10-CM | POA: Diagnosis not present

## 2019-12-04 DIAGNOSIS — E210 Primary hyperparathyroidism: Secondary | ICD-10-CM | POA: Diagnosis not present

## 2019-12-04 DIAGNOSIS — M8580 Other specified disorders of bone density and structure, unspecified site: Secondary | ICD-10-CM | POA: Diagnosis not present

## 2019-12-04 DIAGNOSIS — M47814 Spondylosis without myelopathy or radiculopathy, thoracic region: Secondary | ICD-10-CM | POA: Diagnosis not present

## 2019-12-04 DIAGNOSIS — I700 Atherosclerosis of aorta: Secondary | ICD-10-CM | POA: Diagnosis not present

## 2019-12-06 ENCOUNTER — Encounter: Payer: Self-pay | Admitting: Family Medicine

## 2019-12-07 ENCOUNTER — Other Ambulatory Visit: Payer: Self-pay | Admitting: Family Medicine

## 2019-12-07 DIAGNOSIS — I2609 Other pulmonary embolism with acute cor pulmonale: Secondary | ICD-10-CM

## 2019-12-07 MED ORDER — APIXABAN 5 MG PO TABS: 5.0000 mg | ORAL_TABLET | Freq: Two times a day (BID) | ORAL | 2 refills | Status: DC

## 2019-12-08 ENCOUNTER — Telehealth: Payer: Self-pay

## 2019-12-08 DIAGNOSIS — M47814 Spondylosis without myelopathy or radiculopathy, thoracic region: Secondary | ICD-10-CM | POA: Diagnosis not present

## 2019-12-08 DIAGNOSIS — I2609 Other pulmonary embolism with acute cor pulmonale: Secondary | ICD-10-CM

## 2019-12-08 DIAGNOSIS — F419 Anxiety disorder, unspecified: Secondary | ICD-10-CM | POA: Diagnosis not present

## 2019-12-08 DIAGNOSIS — I119 Hypertensive heart disease without heart failure: Secondary | ICD-10-CM | POA: Diagnosis not present

## 2019-12-08 DIAGNOSIS — E210 Primary hyperparathyroidism: Secondary | ICD-10-CM | POA: Diagnosis not present

## 2019-12-08 DIAGNOSIS — M8580 Other specified disorders of bone density and structure, unspecified site: Secondary | ICD-10-CM | POA: Diagnosis not present

## 2019-12-08 DIAGNOSIS — I700 Atherosclerosis of aorta: Secondary | ICD-10-CM | POA: Diagnosis not present

## 2019-12-08 DIAGNOSIS — I2699 Other pulmonary embolism without acute cor pulmonale: Secondary | ICD-10-CM | POA: Diagnosis not present

## 2019-12-08 DIAGNOSIS — K219 Gastro-esophageal reflux disease without esophagitis: Secondary | ICD-10-CM | POA: Diagnosis not present

## 2019-12-08 DIAGNOSIS — E871 Hypo-osmolality and hyponatremia: Secondary | ICD-10-CM | POA: Diagnosis not present

## 2019-12-08 NOTE — Telephone Encounter (Signed)
Eliquis 5 mg bid now. Thanks, BJ

## 2019-12-08 NOTE — Telephone Encounter (Signed)
Pharmacy needs clarification on pt's Eliquis before they can fill it. Is she just suppose to be taking 1 tab bid now?

## 2019-12-09 ENCOUNTER — Encounter: Payer: Self-pay | Admitting: Family Medicine

## 2019-12-09 MED ORDER — APIXABAN 5 MG PO TABS: 5.0000 mg | ORAL_TABLET | Freq: Two times a day (BID) | ORAL | 2 refills | Status: DC

## 2019-12-09 MED ORDER — AMLODIPINE BESYLATE 5 MG PO TABS: 5.0000 mg | ORAL_TABLET | Freq: Every day | ORAL | 1 refills | Status: DC

## 2019-12-09 NOTE — Telephone Encounter (Signed)
Rx corrected & sent in.

## 2019-12-09 NOTE — Addendum Note (Signed)
Addended by: Rodrigo Ran on: 12/09/2019 06:55 AM   Modules accepted: Orders

## 2019-12-14 ENCOUNTER — Ambulatory Visit: Payer: Medicare HMO | Admitting: Family Medicine

## 2019-12-14 DIAGNOSIS — M8580 Other specified disorders of bone density and structure, unspecified site: Secondary | ICD-10-CM | POA: Diagnosis not present

## 2019-12-14 DIAGNOSIS — I2699 Other pulmonary embolism without acute cor pulmonale: Secondary | ICD-10-CM | POA: Diagnosis not present

## 2019-12-14 DIAGNOSIS — I119 Hypertensive heart disease without heart failure: Secondary | ICD-10-CM | POA: Diagnosis not present

## 2019-12-14 DIAGNOSIS — E210 Primary hyperparathyroidism: Secondary | ICD-10-CM | POA: Diagnosis not present

## 2019-12-14 DIAGNOSIS — E871 Hypo-osmolality and hyponatremia: Secondary | ICD-10-CM | POA: Diagnosis not present

## 2019-12-14 DIAGNOSIS — M47814 Spondylosis without myelopathy or radiculopathy, thoracic region: Secondary | ICD-10-CM | POA: Diagnosis not present

## 2019-12-14 DIAGNOSIS — K219 Gastro-esophageal reflux disease without esophagitis: Secondary | ICD-10-CM | POA: Diagnosis not present

## 2019-12-14 DIAGNOSIS — F419 Anxiety disorder, unspecified: Secondary | ICD-10-CM | POA: Diagnosis not present

## 2019-12-14 DIAGNOSIS — I700 Atherosclerosis of aorta: Secondary | ICD-10-CM | POA: Diagnosis not present

## 2019-12-16 ENCOUNTER — Ambulatory Visit: Payer: Medicare HMO | Admitting: Family Medicine

## 2019-12-17 DIAGNOSIS — M8580 Other specified disorders of bone density and structure, unspecified site: Secondary | ICD-10-CM | POA: Diagnosis not present

## 2019-12-17 DIAGNOSIS — M47814 Spondylosis without myelopathy or radiculopathy, thoracic region: Secondary | ICD-10-CM | POA: Diagnosis not present

## 2019-12-17 DIAGNOSIS — K219 Gastro-esophageal reflux disease without esophagitis: Secondary | ICD-10-CM | POA: Diagnosis not present

## 2019-12-17 DIAGNOSIS — E210 Primary hyperparathyroidism: Secondary | ICD-10-CM | POA: Diagnosis not present

## 2019-12-17 DIAGNOSIS — I119 Hypertensive heart disease without heart failure: Secondary | ICD-10-CM | POA: Diagnosis not present

## 2019-12-17 DIAGNOSIS — I700 Atherosclerosis of aorta: Secondary | ICD-10-CM | POA: Diagnosis not present

## 2019-12-17 DIAGNOSIS — I2699 Other pulmonary embolism without acute cor pulmonale: Secondary | ICD-10-CM | POA: Diagnosis not present

## 2019-12-17 DIAGNOSIS — F419 Anxiety disorder, unspecified: Secondary | ICD-10-CM | POA: Diagnosis not present

## 2019-12-17 DIAGNOSIS — E871 Hypo-osmolality and hyponatremia: Secondary | ICD-10-CM | POA: Diagnosis not present

## 2019-12-18 ENCOUNTER — Other Ambulatory Visit: Payer: Self-pay

## 2019-12-18 DIAGNOSIS — F419 Anxiety disorder, unspecified: Secondary | ICD-10-CM | POA: Diagnosis not present

## 2019-12-18 DIAGNOSIS — I2699 Other pulmonary embolism without acute cor pulmonale: Secondary | ICD-10-CM | POA: Diagnosis not present

## 2019-12-18 DIAGNOSIS — E210 Primary hyperparathyroidism: Secondary | ICD-10-CM | POA: Diagnosis not present

## 2019-12-18 DIAGNOSIS — M47814 Spondylosis without myelopathy or radiculopathy, thoracic region: Secondary | ICD-10-CM | POA: Diagnosis not present

## 2019-12-18 DIAGNOSIS — I119 Hypertensive heart disease without heart failure: Secondary | ICD-10-CM | POA: Diagnosis not present

## 2019-12-18 DIAGNOSIS — E871 Hypo-osmolality and hyponatremia: Secondary | ICD-10-CM | POA: Diagnosis not present

## 2019-12-18 DIAGNOSIS — M8580 Other specified disorders of bone density and structure, unspecified site: Secondary | ICD-10-CM | POA: Diagnosis not present

## 2019-12-18 DIAGNOSIS — I700 Atherosclerosis of aorta: Secondary | ICD-10-CM | POA: Diagnosis not present

## 2019-12-18 DIAGNOSIS — K219 Gastro-esophageal reflux disease without esophagitis: Secondary | ICD-10-CM | POA: Diagnosis not present

## 2019-12-21 ENCOUNTER — Ambulatory Visit: Payer: Medicare HMO | Admitting: Family Medicine

## 2019-12-21 ENCOUNTER — Ambulatory Visit (INDEPENDENT_AMBULATORY_CARE_PROVIDER_SITE_OTHER): Payer: Medicare HMO | Admitting: Family Medicine

## 2019-12-21 ENCOUNTER — Encounter: Payer: Self-pay | Admitting: Family Medicine

## 2019-12-21 ENCOUNTER — Other Ambulatory Visit: Payer: Self-pay

## 2019-12-21 VITALS — BP 130/80 | HR 103 | Resp 16 | Ht 63.0 in | Wt 182.0 lb

## 2019-12-21 DIAGNOSIS — I10 Essential (primary) hypertension: Secondary | ICD-10-CM | POA: Diagnosis not present

## 2019-12-21 DIAGNOSIS — I2609 Other pulmonary embolism with acute cor pulmonale: Secondary | ICD-10-CM | POA: Diagnosis not present

## 2019-12-21 DIAGNOSIS — F419 Anxiety disorder, unspecified: Secondary | ICD-10-CM

## 2019-12-21 DIAGNOSIS — E871 Hypo-osmolality and hyponatremia: Secondary | ICD-10-CM

## 2019-12-21 DIAGNOSIS — Z6832 Body mass index (BMI) 32.0-32.9, adult: Secondary | ICD-10-CM

## 2019-12-21 DIAGNOSIS — R6889 Other general symptoms and signs: Secondary | ICD-10-CM | POA: Diagnosis not present

## 2019-12-21 DIAGNOSIS — R600 Localized edema: Secondary | ICD-10-CM | POA: Diagnosis not present

## 2019-12-21 DIAGNOSIS — E6609 Other obesity due to excess calories: Secondary | ICD-10-CM | POA: Diagnosis not present

## 2019-12-21 DIAGNOSIS — E669 Obesity, unspecified: Secondary | ICD-10-CM | POA: Insufficient documentation

## 2019-12-21 NOTE — Assessment & Plan Note (Signed)
BP adequately controlled. No changes in current management. Continue low salt diet. 

## 2019-12-21 NOTE — Assessment & Plan Note (Signed)
Since her last visit she has changed her diet and lost about 10 pounds. Encouraged to continue following a healthful diet and low impact regular physical activity, walking for a few minutes daily.

## 2019-12-21 NOTE — Patient Instructions (Signed)
A few things to remember from today's visit:   Essential hypertension  Vitamin D deficiency, unspecified  Anxiety disorder, unspecified type  Other acute pulmonary embolism with acute cor pulmonale (HCC)  Hypercalcemia - Plan: Protein Electrophoresis, Urine Rflx.  Bilateral lower extremity edema  No changes today. Continue daily walking. Be careful with falls.   Please be sure medication list is accurate. If a new problem present, please set up appointment sooner than planned today.

## 2019-12-21 NOTE — Assessment & Plan Note (Signed)
We are not quite sure about risk factor,?  Trauma. Continue Eliquis 5 mg bid. We discussed side effects of medications. She would like to complete 4-6 months treatment instead lifelong treatment.

## 2019-12-21 NOTE — Progress Notes (Signed)
HPI:   [name] is a 86 y.o. female, who is here today for chronic disease management.    She was last seen on 11/20/19 for hospital follow up.  Since her last visit she has been eating healthier and has lost wt. Her son is cooking her meals.  She is on Eliquis 5 mg bid. She has tolerated medication well.  Last visit she reported fall before hospitalization,which could have been contributed to thrombotic event.  Lab Results  Component Value Date   WBC 7.6 11/20/2019   HGB 15.2 (H) 11/20/2019   HCT 45.0 11/20/2019   MCV 88.6 11/20/2019   PLT 261.0 11/20/2019   She is still having PT at home 2 times per week. She has no problem getting up from seating position. No falls.  LE edema improves with elevation but it hurst to have them with elevation. She is requesting a prescription for a lazy boy recliner to help with LE elevation, which helps with LE edema.  HTN:  Denies severe/frequent headache, visual changes, chest pain, dyspnea, palpitation,focal weakness, or worsening  edema.  Lab Results  Component Value Date   CREATININE 0.87 11/20/2019   BUN 14 11/20/2019   NA 132 (L) 11/20/2019   K 4.7 11/20/2019   CL 99 11/20/2019   CO2 23 11/20/2019   Hyperparathyroidism: Vit D deficiency. Taking Vit D 2000 U daily.  Lab Results  Component Value Date   PTH 127 (H) 01/12/2019   CALCIUM 11.4 (H) 11/20/2019   Since she start eating healthier she has had daily bowel movements. She has not noted muscle cramps , numbness,tongling,or MS changes.  Anxiety: She is on Lorazepam 0.5 mg daily as needed. Medication is still helping and she has not noted side effects.   Review of Systems  Constitutional: Negative for activity change, appetite change, fatigue and fever.  HENT: Negative for mouth sores, nosebleeds and sore throat.   Respiratory: Negative for cough and wheezing.   Gastrointestinal: Negative for abdominal pain, nausea and vomiting.       Negative  for changes in bowel habits.  Genitourinary: Negative for decreased urine volume, dysuria and hematuria.  Musculoskeletal: Positive for arthralgias and gait problem.  Neurological: Negative for syncope and facial asymmetry.  Rest of ROS, see pertinent positives sand negatives in HPI   Current Outpatient Medications on File Prior to Visit  Medication Sig Dispense Refill  . acetaminophen (TYLENOL) 500 MG tablet Take 500 mg by mouth every 6 (six) hours as needed (arthritis pain).    Marland Kitchen amLODipine (NORVASC) 5 MG tablet Take 1 tablet (5 mg total) by mouth daily. 90 tablet 1  . apixaban (ELIQUIS) 5 MG TABS tablet Take 1 tablet (5 mg total) by mouth 2 (two) times daily. 60 tablet 2  . benazepril (LOTENSIN) 40 MG tablet Take 1 tablet (40 mg total) by mouth daily. 90 tablet 3  . furosemide (LASIX) 40 MG tablet Take 1 tablet 3 times weekly 30 tablet 3  . latanoprost (XALATAN) 0.005 % ophthalmic solution INSTILL 1 DROP INTO BOTH EYES AT BEDTIME (Patient taking differently: Place 1 drop into both eyes at bedtime. ) 7.5 mL 3  . LORazepam (ATIVAN) 0.5 MG tablet Take 1 tablet (0.5 mg total) by mouth daily as needed for anxiety. for anxiety 30 tablet 0  . meclizine (ANTIVERT) 25 MG tablet Take 1 tablet (25 mg total) by mouth 4 (four) times daily as needed. (Patient taking differently: Take 25 mg by mouth 4 (  four) times daily as needed for dizziness. ) 90 tablet 3  . omeprazole (PRILOSEC) 20 MG capsule Take 1 capsule (20 mg total) by mouth daily. 90 capsule 3  . Respiratory Therapy Supplies (ONE FLOW SPIROMETER) DEVI 1 Device by Does not apply route 3 (three) times daily as needed. 1 each 0  . VITAMIN D PO Take 2,000 Units by mouth daily.      No current facility-administered medications on file prior to visit.   Past Medical History:  Diagnosis Date  . GERD 10/03/2007  . HYPERTENSION 10/03/2007  . OSTEOPENIA 02/03/2008  . Primary hyperparathyroidism ([place]) 10/03/2007   Allergies  Allergen Reactions  .  Codeine Nausea And Vomiting    Social History   Socioeconomic History  . Marital status: Widowed    Spouse name: Not on file  . Number of children: Not on file  . Years of education: Not on file  . Highest education level: Not on file  Occupational History  . Not on file  Tobacco Use  . Smoking status: Former Smoker    Quit date: 11/12/1978    Years since quitting: 41.1  . Smokeless tobacco: Never Used  Substance and Sexual Activity  . Alcohol use: No  . Drug use: No  . Sexual activity: Not on file  Other Topics Concern  . Not on file  Social History Narrative  . Not on file   Social Determinants of Health   Financial Resource Strain:   . Difficulty of Paying Living Expenses: Not on file  Food Insecurity:   . Worried About Charity fundraiser in the Last Year: Not on file  . Ran Out of Food in the Last Year: Not on file  Transportation Needs:   . Lack of Transportation (Medical): Not on file  . Lack of Transportation (Non-Medical): Not on file  Physical Activity:   . Days of Exercise per Week: Not on file  . Minutes of Exercise per Session: Not on file  Stress:   . Feeling of Stress : Not on file  Social Connections:   . Frequency of Communication with Friends and Family: Not on file  . Frequency of Social Gatherings with Friends and Family: Not on file  . Attends Religious Services: Not on file  . Active Member of Clubs or Organizations: Not on file  . Attends Archivist Meetings: Not on file  . Marital Status: Not on file    Vitals:   12/21/19 1158  BP: 130/80  Pulse: (!) 103  Resp: 16  SpO2: 97%   Wt Readings from Last 3 Encounters:  12/21/19 182 lb (82.6 kg)  11/16/19 192 lb 1.6 oz (87.1 kg)  07/15/19 187 lb 2 oz (84.9 kg)    Body mass index is 32.24 kg/m.  Physical Exam  Nursing note and vitals reviewed. Constitutional: She is oriented to person, place, and time. She appears well-developed. No distress.  HENT:  Head: Normocephalic  and atraumatic.  Mouth/Throat: Oropharynx is clear and moist and mucous membranes are normal.  Eyes: Pupils are equal, round, and reactive to light. Conjunctivae are normal.  Cardiovascular: Normal rate and regular rhythm.  No murmur heard. Pulses:      Dorsalis pedis pulses are 2+ on the right side and 2+ on the left side.  HR: 92/min  Respiratory: Effort normal and breath sounds normal. No respiratory distress.  GI: Soft. She exhibits no mass. There is no abdominal tenderness.  Musculoskeletal:  General: Edema (Trace pitting LE edema,bilateral.) present.  Neurological: She is alert and oriented to person, place, and time. She has normal strength. No cranial nerve deficit.  Gait assisted by walker.  Skin: Skin is warm. No rash noted. No erythema.  Psychiatric: Her mood appears anxious.  Well groomed, good eye contact.   ASSESSMENT AND PLAN:  Ms. ELOYSE HIBMA was seen today for chronic disease management.  Orders Placed This Encounter  Procedures  . Protein Electrophoresis, Urine Rflx.    Other acute pulmonary embolism with acute cor pulmonale (HCC) We are not quite sure about risk factor,?  Trauma. Continue Eliquis 5 mg bid. We discussed side effects of medications. She would like to complete 4-6 months treatment instead lifelong treatment.   Hyponatremia Mild. She does not want blood work done today because it is difficult to stick.   Essential hypertension BP adequately controlled. No changes in current management. Continue low salt diet.  Class 1 obesity with body mass index (BMI) of 32.0 to 32.9 in adult Since her last visit she has changed her diet and lost about 10 pounds. Encouraged to continue following a healthful diet and low impact regular physical activity, walking for a few minutes daily.   Anxiety disorder, unspecified type Stable. Continue Alprazolam 0.5 mg 1/2-1 tab daily as needed. We have discussed side effects.  Hypercalcemia Prefers  not to have blood work today. Primary hyperparathyroidism. Ca++ has been stable. PTH 01/2019 was 127 (328 and 319).  Bilateral lower extremity edema Continue LE elevation and/or compression stocking. Appropriate skin care. Side effects of diuretics discussed.   Return in about 4 months (around 04/19/2020).    Tajae Rybicki G. [place], MD  [facility]. [place] office.

## 2019-12-21 NOTE — Assessment & Plan Note (Signed)
Mild. She does not want blood work done today because it is difficult to stick.

## 2019-12-22 DIAGNOSIS — I2699 Other pulmonary embolism without acute cor pulmonale: Secondary | ICD-10-CM | POA: Diagnosis not present

## 2019-12-22 DIAGNOSIS — I700 Atherosclerosis of aorta: Secondary | ICD-10-CM | POA: Diagnosis not present

## 2019-12-22 DIAGNOSIS — M8580 Other specified disorders of bone density and structure, unspecified site: Secondary | ICD-10-CM | POA: Diagnosis not present

## 2019-12-22 DIAGNOSIS — I119 Hypertensive heart disease without heart failure: Secondary | ICD-10-CM | POA: Diagnosis not present

## 2019-12-22 DIAGNOSIS — M47814 Spondylosis without myelopathy or radiculopathy, thoracic region: Secondary | ICD-10-CM | POA: Diagnosis not present

## 2019-12-22 DIAGNOSIS — K219 Gastro-esophageal reflux disease without esophagitis: Secondary | ICD-10-CM | POA: Diagnosis not present

## 2019-12-22 DIAGNOSIS — E871 Hypo-osmolality and hyponatremia: Secondary | ICD-10-CM | POA: Diagnosis not present

## 2019-12-22 DIAGNOSIS — E210 Primary hyperparathyroidism: Secondary | ICD-10-CM | POA: Diagnosis not present

## 2019-12-22 DIAGNOSIS — F419 Anxiety disorder, unspecified: Secondary | ICD-10-CM | POA: Diagnosis not present

## 2019-12-24 DIAGNOSIS — E871 Hypo-osmolality and hyponatremia: Secondary | ICD-10-CM | POA: Diagnosis not present

## 2019-12-24 DIAGNOSIS — F419 Anxiety disorder, unspecified: Secondary | ICD-10-CM | POA: Diagnosis not present

## 2019-12-24 DIAGNOSIS — I700 Atherosclerosis of aorta: Secondary | ICD-10-CM | POA: Diagnosis not present

## 2019-12-24 DIAGNOSIS — K219 Gastro-esophageal reflux disease without esophagitis: Secondary | ICD-10-CM | POA: Diagnosis not present

## 2019-12-24 DIAGNOSIS — M47814 Spondylosis without myelopathy or radiculopathy, thoracic region: Secondary | ICD-10-CM | POA: Diagnosis not present

## 2019-12-24 DIAGNOSIS — E210 Primary hyperparathyroidism: Secondary | ICD-10-CM | POA: Diagnosis not present

## 2019-12-24 DIAGNOSIS — I119 Hypertensive heart disease without heart failure: Secondary | ICD-10-CM | POA: Diagnosis not present

## 2019-12-24 DIAGNOSIS — M8580 Other specified disorders of bone density and structure, unspecified site: Secondary | ICD-10-CM | POA: Diagnosis not present

## 2019-12-24 DIAGNOSIS — I2699 Other pulmonary embolism without acute cor pulmonale: Secondary | ICD-10-CM | POA: Diagnosis not present

## 2019-12-24 LAB — PROTEIN ELECTROPHORESIS, URINE REFLEX
Albumin ELP, Urine: 56 %
Alpha-1-Globulin, U: 1.7 %
Alpha-2-Globulin, U: 12.3 %
Beta Globulin, U: 18 %
Gamma Globulin, U: 12 %
Protein, Ur: 30 mg/dL

## 2019-12-25 ENCOUNTER — Telehealth: Payer: Self-pay | Admitting: Family Medicine

## 2019-12-25 NOTE — Telephone Encounter (Signed)
Patient needs a call to schedule her lab work.  She requested them per MyChart, however there are no lab orders in the system.

## 2019-12-25 NOTE — Telephone Encounter (Signed)
She did not want labs done after last visit because she is hard to stick; so we decided to wait until next visit. Thanks, BJ

## 2019-12-25 NOTE — Telephone Encounter (Signed)
Which labs does she need to have drawn?

## 2019-12-25 NOTE — Telephone Encounter (Signed)
Labs will be done at pt's visit on 2/23.

## 2019-12-28 DIAGNOSIS — K219 Gastro-esophageal reflux disease without esophagitis: Secondary | ICD-10-CM | POA: Diagnosis not present

## 2019-12-28 DIAGNOSIS — F419 Anxiety disorder, unspecified: Secondary | ICD-10-CM | POA: Diagnosis not present

## 2019-12-28 DIAGNOSIS — M47814 Spondylosis without myelopathy or radiculopathy, thoracic region: Secondary | ICD-10-CM | POA: Diagnosis not present

## 2019-12-28 DIAGNOSIS — I700 Atherosclerosis of aorta: Secondary | ICD-10-CM | POA: Diagnosis not present

## 2019-12-28 DIAGNOSIS — M8580 Other specified disorders of bone density and structure, unspecified site: Secondary | ICD-10-CM | POA: Diagnosis not present

## 2019-12-28 DIAGNOSIS — I119 Hypertensive heart disease without heart failure: Secondary | ICD-10-CM | POA: Diagnosis not present

## 2019-12-28 DIAGNOSIS — E871 Hypo-osmolality and hyponatremia: Secondary | ICD-10-CM | POA: Diagnosis not present

## 2019-12-28 DIAGNOSIS — I2699 Other pulmonary embolism without acute cor pulmonale: Secondary | ICD-10-CM | POA: Diagnosis not present

## 2019-12-28 DIAGNOSIS — E210 Primary hyperparathyroidism: Secondary | ICD-10-CM | POA: Diagnosis not present

## 2020-01-04 ENCOUNTER — Other Ambulatory Visit: Payer: Self-pay

## 2020-01-04 DIAGNOSIS — E210 Primary hyperparathyroidism: Secondary | ICD-10-CM | POA: Diagnosis not present

## 2020-01-04 DIAGNOSIS — M47814 Spondylosis without myelopathy or radiculopathy, thoracic region: Secondary | ICD-10-CM | POA: Diagnosis not present

## 2020-01-04 DIAGNOSIS — I2699 Other pulmonary embolism without acute cor pulmonale: Secondary | ICD-10-CM | POA: Diagnosis not present

## 2020-01-04 DIAGNOSIS — E871 Hypo-osmolality and hyponatremia: Secondary | ICD-10-CM | POA: Diagnosis not present

## 2020-01-04 DIAGNOSIS — I700 Atherosclerosis of aorta: Secondary | ICD-10-CM | POA: Diagnosis not present

## 2020-01-04 DIAGNOSIS — I119 Hypertensive heart disease without heart failure: Secondary | ICD-10-CM | POA: Diagnosis not present

## 2020-01-04 DIAGNOSIS — K219 Gastro-esophageal reflux disease without esophagitis: Secondary | ICD-10-CM | POA: Diagnosis not present

## 2020-01-04 DIAGNOSIS — F419 Anxiety disorder, unspecified: Secondary | ICD-10-CM | POA: Diagnosis not present

## 2020-01-04 DIAGNOSIS — M8580 Other specified disorders of bone density and structure, unspecified site: Secondary | ICD-10-CM | POA: Diagnosis not present

## 2020-01-05 ENCOUNTER — Ambulatory Visit (INDEPENDENT_AMBULATORY_CARE_PROVIDER_SITE_OTHER): Payer: Medicare HMO | Admitting: Family Medicine

## 2020-01-05 ENCOUNTER — Encounter: Payer: Self-pay | Admitting: Family Medicine

## 2020-01-05 VITALS — BP 124/80 | HR 84 | Resp 16 | Ht 63.0 in

## 2020-01-05 DIAGNOSIS — I10 Essential (primary) hypertension: Secondary | ICD-10-CM

## 2020-01-05 DIAGNOSIS — E559 Vitamin D deficiency, unspecified: Secondary | ICD-10-CM | POA: Diagnosis not present

## 2020-01-05 DIAGNOSIS — E210 Primary hyperparathyroidism: Secondary | ICD-10-CM

## 2020-01-05 DIAGNOSIS — I2609 Other pulmonary embolism with acute cor pulmonale: Secondary | ICD-10-CM | POA: Diagnosis not present

## 2020-01-05 DIAGNOSIS — F419 Anxiety disorder, unspecified: Secondary | ICD-10-CM | POA: Diagnosis not present

## 2020-01-05 DIAGNOSIS — R2681 Unsteadiness on feet: Secondary | ICD-10-CM | POA: Diagnosis not present

## 2020-01-05 DIAGNOSIS — E871 Hypo-osmolality and hyponatremia: Secondary | ICD-10-CM

## 2020-01-05 LAB — BASIC METABOLIC PANEL
BUN: 18 mg/dL (ref 6–23)
CO2: 26 mEq/L (ref 19–32)
Calcium: 12.1 mg/dL — ABNORMAL HIGH (ref 8.4–10.5)
Chloride: 101 mEq/L (ref 96–112)
Creatinine, Ser: 0.99 mg/dL (ref 0.40–1.20)
GFR: 53.03 mL/min — ABNORMAL LOW (ref >60.00)
Glucose, Bld: 97 mg/dL (ref 70–99)
Potassium: 4.8 mEq/L (ref 3.5–5.1)
Sodium: 137 mEq/L (ref 135–145)

## 2020-01-05 LAB — CBC
HCT: 46.1 % — ABNORMAL HIGH (ref 36.0–46.0)
Hemoglobin: 15.3 g/dL — ABNORMAL HIGH (ref 12.0–15.0)
MCHC: 33.2 g/dL (ref 30.0–36.0)
MCV: 88.3 fl (ref 78.0–100.0)
Platelets: 210 10*3/uL (ref 150.0–400.0)
RBC: 5.22 Mil/uL — ABNORMAL HIGH (ref 3.87–5.11)
RDW: 14.5 % (ref 11.5–15.5)
WBC: 6 10*3/uL (ref 4.0–10.5)

## 2020-01-05 LAB — VITAMIN D 25 HYDROXY (VIT D DEFICIENCY, FRACTURES): VITD: 39.52 ng/mL (ref 30.00–100.00)

## 2020-01-05 NOTE — Assessment & Plan Note (Signed)
No changes in current management, will follow 25 OH vitamin D done today and will give further recommendations accordingly.

## 2020-01-05 NOTE — Progress Notes (Signed)
HPI:   Annette [name] is a 86 y.o. female, who is here today with her son to address some of her health problems,review prior labs,and have blood work done.  She was last seen on 12/21/19, at this time she declined blood work,afraid of bruising or not able to get blood. Concerned about possible side effects of Eliquis. PE dx'ed on 11/12/19, started on Eliquis then.  Anxiety has been aggravated by recent health issues. Negative for depressed mood or suicidal thoughts. She takes Lorazepam 0.5 mg daily prn. She has been on benzo for many years, at some point she was taking it seldom,more frequent since dx'ed with PE.   HTN: Still following low-salt diet. Currently she is on benazepril 40 mg daily and amlodipine 5 mg daily. Concerned about K+ level, she has had hypoK+ before. She takes Lasix 40 mg 1 tab 3 times per week for LE edema.  Lab Results  Component Value Date   CREATININE 0.87 11/20/2019   BUN 14 11/20/2019   NA 132 (L) 11/20/2019   K 4.7 11/20/2019   CL 99 11/20/2019   CO2 23 11/20/2019   Negative for severe/frequent headache, visual changes, chest pain, dyspnea, palpitation,focal weakness, or worsening edema. Irregular HR, states that she has had problem for a while.  LE edema improved with LE elevation a few times per day.  Lab Results  Component Value Date   WBC 7.6 11/20/2019   HGB 15.2 (H) 11/20/2019   HCT 45.0 11/20/2019   MCV 88.6 11/20/2019   PLT 261.0 11/20/2019   Completed PT yesterday. She feels like it did help with unstable gait. She has not had any falls since her last visit.  Elevated Ca++ for at least 5 years, it has been stable.  Lab Results  Component Value Date   PTH 127 (H) 01/12/2019   CALCIUM 11.4 (H) 11/20/2019   Negative for unusual fatigue,changes in appetite, worsening constipation,or MS changes. No polydipsia or polyuria.  Vitamin D deficiency: Currently she is on OTC vitamin D 2000 units.  Review of Systems   Constitutional: Negative for activity change, appetite change, fatigue and fever.  HENT: Negative for mouth sores, nosebleeds and sore throat.   Respiratory: Negative for cough and wheezing.   Gastrointestinal: Negative for abdominal pain, nausea and vomiting.       Negative for changes in bowel habits.  Genitourinary: Negative for decreased urine volume, dysuria and hematuria.  Musculoskeletal: Positive for gait problem.  Skin: Negative for rash and wound.  Neurological: Negative for syncope and facial asymmetry.  Psychiatric/Behavioral: Negative for confusion and hallucinations. The patient is nervous/anxious.   Rest of ROS, see pertinent positives sand negatives in HPI  Current Outpatient Medications on File Prior to Visit  Medication Sig Dispense Refill  . acetaminophen (TYLENOL) 500 MG tablet Take 500 mg by mouth every 6 (six) hours as needed (arthritis pain).    Marland Kitchen amLODipine (NORVASC) 5 MG tablet Take 1 tablet (5 mg total) by mouth daily. 90 tablet 1  . apixaban (ELIQUIS) 5 MG TABS tablet Take 1 tablet (5 mg total) by mouth 2 (two) times daily. 60 tablet 2  . benazepril (LOTENSIN) 40 MG tablet Take 1 tablet (40 mg total) by mouth daily. 90 tablet 3  . furosemide (LASIX) 40 MG tablet Take 1 tablet 3 times weekly 30 tablet 3  . latanoprost (XALATAN) 0.005 % ophthalmic solution INSTILL 1 DROP INTO BOTH EYES AT BEDTIME (Patient taking differently: Place 1 drop into both  eyes at bedtime. ) 7.5 mL 3  . LORazepam (ATIVAN) 0.5 MG tablet Take 1 tablet (0.5 mg total) by mouth daily as needed for anxiety. for anxiety 30 tablet 0  . meclizine (ANTIVERT) 25 MG tablet Take 1 tablet (25 mg total) by mouth 4 (four) times daily as needed. (Patient taking differently: Take 25 mg by mouth 4 (four) times daily as needed for dizziness. ) 90 tablet 3  . omeprazole (PRILOSEC) 20 MG capsule Take 1 capsule (20 mg total) by mouth daily. 90 capsule 3  . Respiratory Therapy Supplies (ONE FLOW SPIROMETER) DEVI 1  Device by Does not apply route 3 (three) times daily as needed. 1 each 0  . VITAMIN D PO Take 2,000 Units by mouth daily.      No current facility-administered medications on file prior to visit.     Past Medical History:  Diagnosis Date  . GERD 10/03/2007  . HYPERTENSION 10/03/2007  . OSTEOPENIA 02/03/2008  . Primary hyperparathyroidism ([place]) 10/03/2007   Allergies  Allergen Reactions  . Codeine Nausea And Vomiting    Social History   Socioeconomic History  . Marital status: Widowed    Spouse name: Not on file  . Number of children: Not on file  . Years of education: Not on file  . Highest education level: Not on file  Occupational History  . Not on file  Tobacco Use  . Smoking status: Former Smoker    Quit date: 11/12/1978    Years since quitting: 41.1  . Smokeless tobacco: Never Used  Substance and Sexual Activity  . Alcohol use: No  . Drug use: No  . Sexual activity: Not on file  Other Topics Concern  . Not on file  Social History Narrative  . Not on file   Social Determinants of Health   Financial Resource Strain:   . Difficulty of Paying Living Expenses: Not on file  Food Insecurity:   . Worried About Charity fundraiser in the Last Year: Not on file  . Ran Out of Food in the Last Year: Not on file  Transportation Needs:   . Lack of Transportation (Medical): Not on file  . Lack of Transportation (Non-Medical): Not on file  Physical Activity:   . Days of Exercise per Week: Not on file  . Minutes of Exercise per Session: Not on file  Stress:   . Feeling of Stress : Not on file  Social Connections:   . Frequency of Communication with Friends and Family: Not on file  . Frequency of Social Gatherings with Friends and Family: Not on file  . Attends Religious Services: Not on file  . Active Member of Clubs or Organizations: Not on file  . Attends Archivist Meetings: Not on file  . Marital Status: Not on file    Vitals:   01/05/20 0758  BP:  124/80  Pulse: 84  Resp: 16  SpO2: 91%   Body mass index is 32.24 kg/m.   Physical Exam  Nursing note and vitals reviewed. Constitutional: She is oriented to person, place, and time. She appears well-developed. No distress.  HENT:  Head: Normocephalic and atraumatic.  Mouth/Throat: Oropharynx is clear and moist and mucous membranes are normal.  Eyes: Conjunctivae are normal.  Cardiovascular: Normal rate. An irregular rhythm present.  No murmur heard. Pulses:      Dorsalis pedis pulses are 2+ on the right side and 2+ on the left side.  Respiratory: Effort normal and breath sounds normal.  No respiratory distress.  GI: Soft. There is no abdominal tenderness.  Musculoskeletal:        General: No edema.  Neurological: She is alert and oriented to person, place, and time. She has normal strength. No cranial nerve deficit.  Gait assisted with walker.  Skin: Skin is warm. No rash noted. No erythema.  Psychiatric: Her mood appears anxious.  Well groomed, good eye contact.   ASSESSMENT AND PLAN:  Annette [name] was seen today for follow-up.  Orders Placed This Encounter  Procedures  . CBC  . Basic metabolic panel  . Parathyroid hormone, intact (no Ca)  . VITAMIN D 25 Hydroxy (Vit-D Deficiency, Fractures)  . Vitamin D, 1,25-dihydroxy   Lab Results  Component Value Date   CREATININE 0.99 01/05/2020   BUN 18 01/05/2020   NA 137 01/05/2020   K 4.8 01/05/2020   CL 101 01/05/2020   CO2 26 01/05/2020   Lab Results  Component Value Date   WBC 6.0 01/05/2020   HGB 15.3 (H) 01/05/2020   HCT 46.1 (H) 01/05/2020   MCV 88.3 01/05/2020   PLT 210.0 01/05/2020   Lab Results  Component Value Date   PTH 151 (H) 01/05/2020   CALCIUM 12.1 (H) 01/05/2020     Anxiety disorder, unspecified Mildly worse. Continue lorazepam 0.5 mg daily as needed. She may feel better after Eliqis is discontinued. So for now no new medications added, she has not been interested in doing so.  Will consider adding a SSRI or SNRI next visit.  Essential hypertension BP adequately controlled. No changes in current management. Continue low-salt diet.  Hyponatremia Mild. We discussed possible etiologies. Further recommendation will be given according to BMP results.  Other acute pulmonary embolism with acute cor pulmonale (HCC) We discussed side effects of Eliquis. Planning on completing 5 to 6 months of treatment.  Primary hyperparathyroidism ([place]) Probably has been otherwise stable. We discussed diagnosis, prognosis, and treatment options. For now we decided to hold on endocrinology referral. Further recommendation will be given according to lab results.  Unstable gait Improved with PT. Fall precautions.  Vitamin D deficiency, unspecified No changes in current management, will follow 25 OH vitamin D done today and will give further recommendations accordingly.   Return in about 3 months (around 04/03/2020) for HTN,anxiety.     [name]. Martinique, MD  [facility]. [place] office.

## 2020-01-05 NOTE — Assessment & Plan Note (Signed)
Improved with PT. Fall precautions.

## 2020-01-05 NOTE — Assessment & Plan Note (Signed)
BP adequately controlled. No changes in current management. Continue low salt diet. 

## 2020-01-05 NOTE — Assessment & Plan Note (Signed)
Probably has been otherwise stable. We discussed diagnosis, prognosis, and treatment options. For now we decided to hold on endocrinology referral. Further recommendation will be given according to lab results.

## 2020-01-05 NOTE — Patient Instructions (Addendum)
A few things to remember from today's visit:   Anxiety disorder, unspecified type  Unstable gait  Vitamin D deficiency, unspecified - Plan: VITAMIN D 25 Hydroxy (Vit-D Deficiency, Fractures), Vitamin D, 1,25-dihydroxy  Hyponatremia - Plan: Basic metabolic panel  Primary hyperparathyroidism ([place]) - Plan: CBC, Parathyroid hormone, intact (no Ca), VITAMIN D 25 Hydroxy (Vit-D Deficiency, Fractures), Vitamin D, 1,25-dihydroxy  Other acute pulmonary embolism with acute cor pulmonale (HCC)  No changes today. Continue daily walking as tolerated and low salt diet.   Please be sure medication list is accurate. If a new problem present, please set up appointment sooner than planned today.

## 2020-01-05 NOTE — Assessment & Plan Note (Addendum)
Mild. We discussed possible etiologies. Further recommendation will be given according to BMP results.

## 2020-01-05 NOTE — Assessment & Plan Note (Signed)
We discussed side effects of Eliquis. Planning on completing 5 to 6 months of treatment.

## 2020-01-05 NOTE — Assessment & Plan Note (Addendum)
Mildly worse. Continue lorazepam 0.5 mg daily as needed. She may feel better after Eliqis is discontinued. So for now no new medications added, she has not been interested in doing so. Will consider adding a SSRI or SNRI next visit.

## 2020-01-08 LAB — VITAMIN D 1,25 DIHYDROXY
Vitamin D 1, 25 (OH)2 Total: 58 pg/mL (ref 18–72)
Vitamin D2 1, 25 (OH)2: <8 pg/mL
Vitamin D3 1, 25 (OH)2: 58 pg/mL

## 2020-01-08 LAB — PARATHYROID HORMONE, INTACT (NO CA): PTH: 151 pg/mL — ABNORMAL HIGH (ref 14–64)

## 2020-01-10 ENCOUNTER — Ambulatory Visit: Payer: Medicare HMO | Attending: Internal Medicine

## 2020-01-10 DIAGNOSIS — Z23 Encounter for immunization: Secondary | ICD-10-CM

## 2020-01-10 NOTE — Progress Notes (Signed)
   Covid-19 Vaccination Clinic  Name:  Annette [name]    MRN: [id number] DOB: Dec 11, 1929  01/10/2020  Annette [name] was observed post Covid-19 immunization for 15 minutes without incidence. She was provided with Vaccine Information Sheet and instruction to access the V-Safe system.   Annette [name] was instructed to call 911 with any severe reactions post vaccine: Marland Kitchen Difficulty breathing  . Swelling of your face and throat  . A fast heartbeat  . A bad rash all over your body  . Dizziness and weakness    Immunizations Administered    Name Date Dose VIS Date Route   Pfizer COVID-19 Vaccine 01/10/2020  9:04 AM 0.3 mL 10/23/2019 Intramuscular   Manufacturer: [place]   Lot: [id number]   [place]: [id number]

## 2020-01-11 ENCOUNTER — Ambulatory Visit: Payer: Medicare HMO | Admitting: Family Medicine

## 2020-01-13 ENCOUNTER — Ambulatory Visit: Payer: Medicare HMO | Admitting: Family Medicine

## 2020-01-18 ENCOUNTER — Ambulatory Visit: Payer: Medicare HMO | Admitting: Family Medicine

## 2020-02-03 ENCOUNTER — Ambulatory Visit: Payer: Medicare HMO | Attending: Internal Medicine

## 2020-02-03 DIAGNOSIS — Z23 Encounter for immunization: Secondary | ICD-10-CM

## 2020-02-03 DIAGNOSIS — R6889 Other general symptoms and signs: Secondary | ICD-10-CM | POA: Diagnosis not present

## 2020-02-03 NOTE — Progress Notes (Signed)
   Covid-19 Vaccination Clinic  Name:  Annette [name]    MRN: [id number] DOB: 05-04-1930  02/03/2020  [name] was observed post Covid-19 immunization for 15 minutes without incident. She was provided with Vaccine Information Sheet and instruction to access the V-Safe system.   [name] was instructed to call 911 with any severe reactions post vaccine: Marland Kitchen Difficulty breathing  . Swelling of face and throat  . A fast heartbeat  . A bad rash all over body  . Dizziness and weakness   Immunizations Administered    Name Date Dose VIS Date Route   Pfizer COVID-19 Vaccine 02/03/2020 10:26 AM 0.3 mL 10/23/2019 Intramuscular   Manufacturer: [place]   Lot: [id number]   [place]: [id number]

## 2020-03-31 DIAGNOSIS — R6889 Other general symptoms and signs: Secondary | ICD-10-CM | POA: Diagnosis not present

## 2020-03-31 DIAGNOSIS — H401131 Primary open-angle glaucoma, bilateral, mild stage: Secondary | ICD-10-CM | POA: Diagnosis not present

## 2020-04-04 ENCOUNTER — Other Ambulatory Visit: Payer: Self-pay

## 2020-04-05 ENCOUNTER — Encounter: Payer: Self-pay | Admitting: Family Medicine

## 2020-04-05 ENCOUNTER — Ambulatory Visit (INDEPENDENT_AMBULATORY_CARE_PROVIDER_SITE_OTHER): Payer: Medicare HMO | Admitting: Family Medicine

## 2020-04-05 VITALS — BP 136/80 | HR 80 | Temp 97.5°F | Resp 16 | Ht 63.0 in | Wt 177.4 lb

## 2020-04-05 DIAGNOSIS — R918 Other nonspecific abnormal finding of lung field: Secondary | ICD-10-CM

## 2020-04-05 DIAGNOSIS — E6609 Other obesity due to excess calories: Secondary | ICD-10-CM | POA: Diagnosis not present

## 2020-04-05 DIAGNOSIS — I2699 Other pulmonary embolism without acute cor pulmonale: Secondary | ICD-10-CM

## 2020-04-05 DIAGNOSIS — I10 Essential (primary) hypertension: Secondary | ICD-10-CM

## 2020-04-05 DIAGNOSIS — Z6832 Body mass index (BMI) 32.0-32.9, adult: Secondary | ICD-10-CM | POA: Diagnosis not present

## 2020-04-05 DIAGNOSIS — R6889 Other general symptoms and signs: Secondary | ICD-10-CM | POA: Diagnosis not present

## 2020-04-05 DIAGNOSIS — F419 Anxiety disorder, unspecified: Secondary | ICD-10-CM

## 2020-04-05 NOTE — Assessment & Plan Note (Addendum)
Stable. She does not think pharmacologic treatment is needed at this time,if problem gets worse SSRI can be considered.

## 2020-04-05 NOTE — Patient Instructions (Addendum)
A few things to remember from today's visit:   Tylenol arthritis 3 times per day as needed. Avoid Ibuprofen.  Today we stopped Eliquis.  No changes in blood pressure medications. We will plan on labs next visit.   If you need refills please call your pharmacy. Do not use My Chart to request refills or for acute issues that need immediate attention.    Please be sure medication list is accurate. If a new problem present, please set up appointment sooner than planned today.

## 2020-04-05 NOTE — Assessment & Plan Note (Signed)
Congratulated about wt loss. Encouraged to continue a healthful diet.

## 2020-04-05 NOTE — Progress Notes (Signed)
HPI:  [name] is a 86 y.o. female, who is here today for 3 months follow up.   She was last seen on 01/05/20. No new problems since her last visit.  HTN:  She is on Amlodipine 5 mg daily and benazepril 40 mg daily. She has not noted headache,CP,SOB,or worsening edema.  Lab Results  Component Value Date   CREATININE 0.99 01/05/2020   BUN 18 01/05/2020   NA 137 01/05/2020   K 4.8 01/05/2020   CL 101 01/05/2020   CO2 26 01/05/2020   LE edema: More noticeable at the end of the day. Furosemide 3 times per week.  PE: Currently she is on Eliquis 5 mg bid, taking med sine 11/2019. She is tolerating medication well, afraid of side effects, she would like to stop it. Negative for palpitation or dyspnea.  Chest CTA:  1. Positive acute PE. Small-moderate clot burden primarily in the right lung, filling defects in the distal main pulmonary artery extending into lobar and subsegmental branches. Additional subsegmental pulmonary emboli in the left upper and lower lobes. Evidence of right heart strain with RV to LV ratio of 1.1.  2. Two pulmonary nodules in the right lung, largest measuring 5 mm. No follow-up needed if patient is low-risk (and has no known or suspected primary neoplasm). Non-contrast chest CT can be considered in 12 months if patient is high-risk.   Back pain and joint pain: She has been taking Tylenol and Ibuprofen Problem exacerbated by prolonged walking and standing. Alleviated by rest.  She is excited because she has lost wt since her last visit. She is eating healthier. She walks around the house and sometimes on her yard but not long due to unstable gait and pain.  Anxiety: She has not taken Lorazepam for 1-2 years. Occasionally some stress at home,she does not get alone with daughter in law but she learned to let things go and avoid confrontations. She does not see her frequently. Sad when she remembers her death husband. Negative for depressed  mood or suicidal thoughts.  Review of Systems  Constitutional: Negative for activity change, fatigue and fever.  HENT: Negative for mouth sores and nosebleeds.   Eyes: Negative for redness and visual disturbance.  Respiratory: Negative for cough and wheezing.   Gastrointestinal: Negative for abdominal pain, nausea and vomiting.       Negative for changes in bowel habits.  Genitourinary: Negative for decreased urine volume, dysuria and hematuria.  Musculoskeletal: Positive for arthralgias, back pain and gait problem.  Skin: Negative for pallor and rash.  Neurological: Negative for syncope and weakness.  Psychiatric/Behavioral: Negative for confusion. The patient is nervous/anxious.   Rest of ROS, see pertinent positives sand negatives in HPI  Current Outpatient Medications on File Prior to Visit  Medication Sig Dispense Refill  . acetaminophen (TYLENOL) 500 MG tablet Take 500 mg by mouth every 6 (six) hours as needed (arthritis pain).    Marland Kitchen amLODipine (NORVASC) 5 MG tablet Take 1 tablet (5 mg total) by mouth daily. 90 tablet 1  . benazepril (LOTENSIN) 40 MG tablet Take 1 tablet (40 mg total) by mouth daily. 90 tablet 3  . furosemide (LASIX) 40 MG tablet Take 1 tablet 3 times weekly 30 tablet 3  . latanoprost (XALATAN) 0.005 % ophthalmic solution INSTILL 1 DROP INTO BOTH EYES AT BEDTIME (Patient taking differently: Place 1 drop into both eyes at bedtime. ) 7.5 mL 3  . meclizine (ANTIVERT) 25 MG tablet Take 1  tablet (25 mg total) by mouth 4 (four) times daily as needed. (Patient taking differently: Take 25 mg by mouth 4 (four) times daily as needed for dizziness. ) 90 tablet 3  . omeprazole (PRILOSEC) 20 MG capsule Take 1 capsule (20 mg total) by mouth daily. 90 capsule 3  . Respiratory Therapy Supplies (ONE FLOW SPIROMETER) DEVI 1 Device by Does not apply route 3 (three) times daily as needed. 1 each 0  . VITAMIN D PO Take 2,000 Units by mouth daily.      No current facility-administered  medications on file prior to visit.   Past Medical History:  Diagnosis Date  . GERD 10/03/2007  . HYPERTENSION 10/03/2007  . OSTEOPENIA 02/03/2008  . Primary hyperparathyroidism ([place]) 10/03/2007   Allergies  Allergen Reactions  . Codeine Nausea And Vomiting    Social History   Socioeconomic History  . Marital status: Widowed    Spouse name: Not on file  . Number of children: Not on file  . Years of education: Not on file  . Highest education level: Not on file  Occupational History  . Not on file  Tobacco Use  . Smoking status: Former Smoker    Quit date: 11/12/1978    Years since quitting: 41.4  . Smokeless tobacco: Never Used  Substance and Sexual Activity  . Alcohol use: No  . Drug use: No  . Sexual activity: Not on file  Other Topics Concern  . Not on file  Social History Narrative  . Not on file   Social Determinants of Health   Financial Resource Strain:   . Difficulty of Paying Living Expenses:   Food Insecurity:   . Worried About Charity fundraiser in the Last Year:   . Arboriculturist in the Last Year:   Transportation Needs:   . Film/video editor (Medical):   Marland Kitchen Lack of Transportation (Non-Medical):   Physical Activity:   . Days of Exercise per Week:   . Minutes of Exercise per Session:   Stress:   . Feeling of Stress :   Social Connections:   . Frequency of Communication with Friends and Family:   . Frequency of Social Gatherings with Friends and Family:   . Attends Religious Services:   . Active Member of Clubs or Organizations:   . Attends Archivist Meetings:   Marland Kitchen Marital Status:     Vitals:   04/05/20 1138  BP: 136/80  Pulse: 80  Resp: 16  Temp: (!) 97.5 F (36.4 C)  SpO2: 96%   Wt Readings from Last 3 Encounters:  04/05/20 177 lb 6 oz (80.5 kg)  12/21/19 182 lb (82.6 kg)  11/16/19 192 lb 1.6 oz (87.1 kg)   Body mass index is 31.42 kg/m.   Physical Exam  Nursing note and vitals reviewed. Constitutional: She is  oriented to person, place, and time. She appears well-developed. No distress.  HENT:  Head: Normocephalic and atraumatic.  Eyes: Pupils are equal, round, and reactive to light. Conjunctivae are normal.  Cardiovascular: Normal rate and regular rhythm.  No murmur heard. Pulses:      Dorsalis pedis pulses are 2+ on the right side and 2+ on the left side.  Respiratory: Effort normal and breath sounds normal. No respiratory distress.  GI: Soft. There is no abdominal tenderness.  Musculoskeletal:        General: No edema.  Neurological: She is alert and oriented to person, place, and time. She has normal strength.  No cranial nerve deficit.  Gait assisted with a walker.  Skin: Skin is warm. No rash noted. No erythema.  Psychiatric: Her mood appears anxious.  Well groomed, good eye contact.   ASSESSMENT AND PLAN:   Ms. CYTLALY NORUM was seen today for 3 months follow-up.  Essential hypertension Well controlled. No changes in current management. Continue low salt diet and avoid NSAID's.side effects discussed.  Pulmonary embolism and infarction [facility]) First time event. She does not want to continue oral anticoagulation and completed 3-4 months,so discontinue it. She understands risk of stopping Eliquis. Instructed about warning signs.  Pulmonary nodules Former smoker. We will discussed need for non contrast CT in 11/2020.  Anxiety disorder, unspecified Stable. She does not think pharmacologic treatment is needed at this time,if problem gets worse SSRI can be considered.  Class 1 obesity with body mass index (BMI) of 32.0 to 32.9 in adult Congratulated about wt loss. Encouraged to continue a healthful diet.   Return in about 5 months (around 09/05/2020) for HTN and awv.  Kaius Daino G. Martinique, MD  [facility]. [place] office.  A few things to remember from today's visit:   Tylenol arthritis 3 times per day as needed. Avoid Ibuprofen.  Today we stopped  Eliquis.  No changes in blood pressure medications. We will plan on labs next visit.   If you need refills please call your pharmacy. Do not use My Chart to request refills or for acute issues that need immediate attention.    Please be sure medication list is accurate. If a new problem present, please set up appointment sooner than planned today.

## 2020-04-18 ENCOUNTER — Other Ambulatory Visit: Payer: Self-pay | Admitting: Family Medicine

## 2020-04-19 ENCOUNTER — Ambulatory Visit: Payer: Medicare HMO | Admitting: Family Medicine

## 2020-05-25 ENCOUNTER — Other Ambulatory Visit: Payer: Self-pay | Admitting: Family Medicine

## 2020-06-22 ENCOUNTER — Encounter: Payer: Self-pay | Admitting: Family Medicine

## 2020-06-29 ENCOUNTER — Other Ambulatory Visit: Payer: Self-pay | Admitting: Family Medicine

## 2020-07-01 ENCOUNTER — Ambulatory Visit (INDEPENDENT_AMBULATORY_CARE_PROVIDER_SITE_OTHER): Payer: Medicare HMO | Admitting: Family Medicine

## 2020-07-01 ENCOUNTER — Other Ambulatory Visit: Payer: Self-pay

## 2020-07-01 ENCOUNTER — Encounter: Payer: Self-pay | Admitting: Family Medicine

## 2020-07-01 VITALS — BP 128/76 | HR 98 | Resp 16 | Ht 63.0 in | Wt 174.0 lb

## 2020-07-01 DIAGNOSIS — F419 Anxiety disorder, unspecified: Secondary | ICD-10-CM

## 2020-07-01 DIAGNOSIS — E559 Vitamin D deficiency, unspecified: Secondary | ICD-10-CM

## 2020-07-01 DIAGNOSIS — E210 Primary hyperparathyroidism: Secondary | ICD-10-CM | POA: Diagnosis not present

## 2020-07-01 DIAGNOSIS — M159 Polyosteoarthritis, unspecified: Secondary | ICD-10-CM | POA: Diagnosis not present

## 2020-07-01 DIAGNOSIS — I10 Essential (primary) hypertension: Secondary | ICD-10-CM

## 2020-07-01 DIAGNOSIS — R6889 Other general symptoms and signs: Secondary | ICD-10-CM

## 2020-07-01 LAB — BASIC METABOLIC PANEL WITH GFR
BUN/Creatinine Ratio: 15 (calc) (ref 6–22)
BUN: 15 mg/dL (ref 7–25)
CO2: 27 mmol/L (ref 20–32)
Calcium: 11.9 mg/dL — ABNORMAL HIGH (ref 8.6–10.4)
Chloride: 101 mmol/L (ref 98–110)
Creat: 0.99 mg/dL — ABNORMAL HIGH (ref 0.60–0.88)
GFR, Est African American: 59 mL/min/{1.73_m2} — ABNORMAL LOW (ref >=60)
GFR, Est Non African American: 51 mL/min/{1.73_m2} — ABNORMAL LOW (ref >=60)
Glucose, Bld: 96 mg/dL (ref 65–99)
Potassium: 4.9 mmol/L (ref 3.5–5.3)
Sodium: 137 mmol/L (ref 135–146)

## 2020-07-01 LAB — VITAMIN B12: Vitamin B-12: 379 pg/mL (ref 200–1100)

## 2020-07-01 LAB — VITAMIN D 25 HYDROXY (VIT D DEFICIENCY, FRACTURES): Vit D, 25-Hydroxy: 37 ng/mL (ref 30–100)

## 2020-07-01 NOTE — Progress Notes (Signed)
Chief Complaint  Patient presents with  . memory concerns   HPI: [name] is a 86 y.o. female, who is here today with her son, who is concerned about episodes of forgetfulness.  According to her son, problem has been going on for a while but gradually becoming worse, increased in frequency. He gives some examples: She just ate a cookie and minutes later she says that she has not eaten one in years. In one occasion, she asked him to check her temp and when he was ready to do so, she acted surprised, states that she wanted her BPcheck.  Her son and wife live with her. He tries to help with preparing meals. Sometimes she gets tired of food he prepares, so she get TV dinners.  Her son would like to have a personal aid to help with ADL's.  She thinks she does well with taking shower, she does it one per week because afraid of irritating her skin. She has a walker to help with transfer and she is able to perform simple chores at home (her son corroborate this).  Lab Results  Component Value Date   TSH 1.63 05/20/2018   Generalized OA. Hands and knees mainly. Pain is aggravated by prolonged walking/standing. Her son would like to hve help with ADL's but she is not interested in doing so.  According to her son, she does not get alone with her daughter in law, they do not talk.  Anxiety: It is usually worse when she watches TV or when weather podcast is warning for bad weather. In the past she took Lorazepam 0.5 mg,which helped. She feels like she may need to start it back because around the anniversary of her husband's death and his birthday she gets anxious and sad. Something to help her relax.   Mini-Cog - 07/01/20 1630    Normal clock drawing test? yes    How many words correct? 3          Montreal Cognitive Assessment  07/01/2020  Visuospatial/ Executive (0/5) 5  Naming (0/3) 3  Attention: Read list of digits (0/2) 2  Attention: Read list of letters (0/1) 1   Attention: Serial 7 subtraction starting at 100 (0/3) 2  Language: Repeat phrase (0/2) 2  Language : Fluency (0/1) 1  Abstraction (0/2) 2  Delayed Recall (0/5) 5  Orientation (0/6) 6  Total 29   His son also would like blood work done today. HyperCa++, stable for years. Last Ca++ 10.8. Vit D deficiency: She is on vit D supplementation.  HTN: Her son is checking BP regularly., wrist monitor. She is on Amlodipine 5 mg daily and Benazepril 40 mg daily Negative for severe/frequent headache, visual changes, chest pain, dyspnea, palpitation, focal weakness, or worsening edema.  Lab Results  Component Value Date   CREATININE 0.99 01/05/2020   BUN 18 01/05/2020   NA 137 01/05/2020   K 4.8 01/05/2020   CL 101 01/05/2020   CO2 26 01/05/2020    Lab Results  Component Value Date   WBC 6.0 01/05/2020   HGB 15.3 (H) 01/05/2020   HCT 46.1 (H) 01/05/2020   MCV 88.3 01/05/2020   PLT 210.0 01/05/2020   Review of Systems  Constitutional: Positive for fatigue. Negative for activity change, appetite change and fever.  HENT: Negative for mouth sores, nosebleeds and sore throat.   Respiratory: Negative for cough and wheezing.   Gastrointestinal: Negative for abdominal pain, nausea and vomiting.  Negative for changes in bowel habits.  Genitourinary: Negative for decreased urine volume, dysuria and hematuria.  Musculoskeletal: Positive for arthralgias, back pain and gait problem.  Neurological: Negative for syncope and facial asymmetry.  Psychiatric/Behavioral: Negative for confusion and hallucinations.  Rest see pertinent positives and negatives per HPI.  Current Outpatient Medications on File Prior to Visit  Medication Sig Dispense Refill  . acetaminophen (TYLENOL) 500 MG tablet Take 500 mg by mouth every 6 (six) hours as needed (arthritis pain).    Marland Kitchen amLODipine (NORVASC) 5 MG tablet TAKE 1 TABLET EVERY DAY 90 tablet 1  . benazepril (LOTENSIN) 40 MG tablet Take 1 tablet (40 mg  total) by mouth daily. 90 tablet 3  . furosemide (LASIX) 40 MG tablet Take 1 tablet 3 times weekly 30 tablet 3  . latanoprost (XALATAN) 0.005 % ophthalmic solution INSTILL 1 DROP INTO BOTH EYES AT BEDTIME (Patient taking differently: Place 1 drop into both eyes at bedtime. ) 7.5 mL 3  . meclizine (ANTIVERT) 25 MG tablet Take 1 tablet (25 mg total) by mouth 4 (four) times daily as needed. (Patient taking differently: Take 25 mg by mouth 4 (four) times daily as needed for dizziness. ) 90 tablet 3  . omeprazole (PRILOSEC) 20 MG capsule TAKE 1 CAPSULE EVERY DAY 90 capsule 3  . Respiratory Therapy Supplies (ONE FLOW SPIROMETER) DEVI 1 Device by Does not apply route 3 (three) times daily as needed. 1 each 0  . VITAMIN D PO Take 2,000 Units by mouth daily.     . mupirocin ointment (BACTROBAN) 2 % APPLY 1 APPLICATION TOPICALLY 2 (TWO) TIMES DAILY FOR 10 DAYS. 22 g 1   No current facility-administered medications on file prior to visit.   Past Medical History:  Diagnosis Date  . GERD 10/03/2007  . HYPERTENSION 10/03/2007  . OSTEOPENIA 02/03/2008  . Primary hyperparathyroidism ([place]) 10/03/2007   Allergies  Allergen Reactions  . Codeine Nausea And Vomiting    Social History   Socioeconomic History  . Marital status: Widowed    Spouse name: Not on file  . Number of children: Not on file  . Years of education: Not on file  . Highest education level: Not on file  Occupational History  . Not on file  Tobacco Use  . Smoking status: Former Smoker    Quit date: 11/12/1978    Years since quitting: 41.6  . Smokeless tobacco: Never Used  Substance and Sexual Activity  . Alcohol use: No  . Drug use: No  . Sexual activity: Not on file  Other Topics Concern  . Not on file  Social History Narrative  . Not on file   Social Determinants of Health   Financial Resource Strain:   . Difficulty of Paying Living Expenses: Not on file  Food Insecurity:   . Worried About Charity fundraiser in the Last  Year: Not on file  . Ran Out of Food in the Last Year: Not on file  Transportation Needs:   . Lack of Transportation (Medical): Not on file  . Lack of Transportation (Non-Medical): Not on file  Physical Activity:   . Days of Exercise per Week: Not on file  . Minutes of Exercise per Session: Not on file  Stress:   . Feeling of Stress : Not on file  Social Connections:   . Frequency of Communication with Friends and Family: Not on file  . Frequency of Social Gatherings with Friends and Family: Not on file  . Attends  Religious Services: Not on file  . Active Member of Clubs or Organizations: Not on file  . Attends Archivist Meetings: Not on file  . Marital Status: Not on file   Vitals:   07/01/20 0948  BP: 128/76  Pulse: 98  Resp: 16  SpO2: 93%   Wt Readings from Last 3 Encounters:  07/01/20 174 lb (78.9 kg)  04/05/20 177 lb 6 oz (80.5 kg)  12/21/19 182 lb (82.6 kg)   Body mass index is 30.82 kg/m.  Physical Exam Vitals and nursing note reviewed.  Constitutional:      General: She is not in acute distress.    Appearance: She is well-developed.  HENT:     Head: Normocephalic and atraumatic.     Mouth/Throat:     Mouth: Mucous membranes are moist.  Eyes:     Conjunctiva/sclera: Conjunctivae normal.  Cardiovascular:     Rate and Rhythm: Normal rate. Rhythm irregular. Occasional extrasystoles are present.    Heart sounds: No murmur heard.      Comments: DP pulses present. Trace pitting LE edema,bilateral. Pulmonary:     Effort: Pulmonary effort is normal. No respiratory distress.     Breath sounds: Normal breath sounds.  Abdominal:     Palpations: Abdomen is soft.     Tenderness: There is no abdominal tenderness.  Lymphadenopathy:     Cervical: No cervical adenopathy.  Skin:    General: Skin is warm.     Findings: No erythema or rash.  Neurological:     Mental Status: She is alert and oriented to person, place, and time.     Cranial Nerves: No  cranial nerve deficit.     Comments: Unstable gait assisted with a walker.  Psychiatric:        Mood and Affect: Affect normal. Mood is anxious.        Cognition and Memory: She does not exhibit impaired recent memory or impaired remote memory.     Comments: Upset with some of son's statements.   ASSESSMENT AND PLAN:  Ms. Callan was seen today for memory concerns.  Diagnoses and all orders for this visit:  Orders Placed This Encounter  Procedures  . Vitamin B12  . VITAMIN D 25 Hydroxy (Vit-D Deficiency, Fractures)  . BASIC METABOLIC PANEL WITH GFR  . Parathyroid hormone, intact (no Ca)   Lab Results  Component Value Date   CREATININE 0.99 (H) 07/01/2020   BUN 15 07/01/2020   NA 137 07/01/2020   K 4.9 07/01/2020   CL 101 07/01/2020   CO2 27 07/01/2020   Lab Results  Component Value Date   [id number] 07/01/2020   Forgetfulness We discussed possible etiologies discussed. Assesment today doesnot suggest a serious cognitive disorder. We discussed strategies that may help slowing down cognitive decline. Neuro evaluation can be arranged for more extensive assessment. Her son agrees but he does not. Further recommendations will be given according to lab results.  Vitamin D deficiency, unspecified No changes in vit D dose, further recommendations according to 25 OH and PTH results.  Essential hypertension BP adequately controlled. No changes in current management. Low salt diet recommended.  Primary hyperparathyroidism ([place]) Stable otherwise. She has refused endocrine evaluation. Further recommendations according to lab results.  Anxiety disorder, unspecified type She wants to take something as needed and Lorazepam helped + it was well tolerated.  Rx for Lorazepam 0.5 mg 1/2-1 tab daily prn. Side effects discussed.  Spent 50 minutes with pt and her son.  During this time hx was obtained and documented, examination and cognitive assessment performed,prior labs  reviewed, differential Dx and plan discussed.  Return in about 5 months (around 12/01/2020).   Elinore Shults G. Martinique, MD  [facility]. [place] office.   A few things to remember from today's visit:   Memory Compensation Strategies  1. Use "WARM" strategy.  W= write it down  A= associate it  R= repeat it  M= make a mental note  2.   You can keep a Social worker.  Use a 3-ring notebook with sections for the following: calendar, important names and phone numbers,  medications, doctors' names/phone numbers, lists/reminders, and a section to journal what you did  each day.   3.    Use a calendar to write appointments down.  4.    Write yourself a schedule for the day.  This can be placed on the calendar or in a separate section of the Memory Notebook.  Keeping a  regular schedule can help memory.  5.    Use medication organizer with sections for each day or morning/evening pills.  You may need help loading it  6.    Keep a basket, or pegboard by the door.  Place items that you need to take out with you in the basket or on the pegboard.  You may also want to  include a message board for reminders.  7.    Use sticky notes.  Place sticky notes with reminders in a place where the task is performed.  For example: " turn off the  stove" placed by the stove, "lock the door" placed on the door at eye level, " take your medications" on  the bathroom mirror or by the place where you normally take your medications.  8.    Use alarms/timers.  Use while cooking to remind yourself to check on food or as a reminder to take your medicine, or as a  reminder to make a call, or as a reminder to perform another task, etc.  If you need refills please call your pharmacy. Do not use My Chart to request refills or for acute issues that need immediate attention.    Please be sure medication list is accurate. If a new problem present, please set up appointment sooner than planned  today.  Dementia Dementia is a condition that affects the way the brain works. It often affects memory and thinking. There are many types of dementia. Some types get worse with time and cannot be reversed. Some types of dementia include:  Alzheimer's disease. This is the most common type.  Vascular dementia. This type may happen due to a stroke.  Lewy body dementia. This type may happen to people who have Parkinson's disease.  Frontotemporal dementia. This type is caused by damage to nerve cells in certain parts of the brain. Some people may have more than one type, and this is called mixed dementia. What are the causes? This condition is caused by damage to cells in the brain. Some causes that cannot be reversed include:  Having a condition that affects the blood vessels of the brain, such as diabetes, heart disease, or blood vessel disease.  Changes to genes. Some causes that can be reversed or slowed include:  Injury to the brain.  Certain medicines.  Infection.  Not having enough vitamin B12 in the body, or thyroid problems.  A tumor or blood clot in the brain. What are the signs or symptoms? Symptoms depend on  the type of dementia. This may include:  Problems remembering things.  Having trouble taking a bath or putting clothes on.  Forgetting appointments.  Forgetting to pay bills.  Trouble planning and making meals.  Having trouble speaking.  Getting lost easily. How is this treated? Treatment depends on the cause of the dementia. It might include taking medicines that help:  To control the dementia.  To slow down the dementia.  To manage symptoms. In some cases, treating the cause of your dementia can improve symptoms, reverse symptoms, or slow down how quickly it gets worse. Your doctor can help you find support groups and other doctors who can help with your care. Follow these instructions at home: Medicines  Take over-the-counter and prescription  medicines only as told by your doctor.  Use a pill organizer to help you manage your medicines.  Avoidtaking medicines for pain or for sleep. Lifestyle  Make healthy choices: ? Be active as told by your doctor. ? Do not use any products that contain nicotine or tobacco, such as cigarettes, e-cigarettes, and chewing tobacco. If you need help quitting, ask your doctor. ? Do not drink alcohol. ? When you get stressed, do something that will help you to relax. Your doctor can give you tips. ? Spend time with other people.  Make sure you get good sleep. To get good sleep: ? Try not to take naps during the day. ? Keep your bedroom dark and cool. ? In the few hours before you go to bed, try not to do any exercise. ? Do not have foods and drinks with caffeine at night. Eating and drinking  Drink enough fluid to keep your pee (urine) pale yellow.  Eat a healthy diet. General instructions   Talk with your doctor to figure out: ? What you need help with. ? What your safety needs are.  Ask your doctor if it is safe for you to drive.  If told, wear a bracelet that tracks where you are or shows that you are a person with memory loss.  Work with your family to make big decisions.  Keep all follow-up visits as told by your doctor. This is important. Contact a doctor if:  You have any new symptoms.  Your symptoms get worse.  You have problems with swallowing or choking. Get help right away if:  You feel very sad, or feel that you want to harm yourself.  You or your family members are worried for your safety. If you ever feel like you may hurt yourself or others, or have thoughts about taking your own life, get help right away. You can go to your nearest emergency department or call:  Your local emergency services (911 in the [place]  A suicide crisis helpline, such as the [facility] at 223-823-6000. This is open 24 hours a day. Summary  Dementia  often affects memory and thinking.  Some types of dementia get worse with time and cannot be reversed.  Treatment for this condition depends on the cause.  Talk with your doctor to figure out what you need help with.  Your doctor can help you find support groups and other doctors who can help with your care. This information is not intended to replace advice given to you by your health care provider. Make sure you discuss any questions you have with your health care provider. Document Revised: 01/13/2019 Document Reviewed: 01/13/2019 Elsevier Patient Education  [place].

## 2020-07-01 NOTE — Patient Instructions (Signed)
A few things to remember from today's visit:   Forgetfulness - Plan: Vitamin B12  Anxiety disorder, unspecified type  Vitamin D deficiency, unspecified - Plan: VITAMIN D 25 Hydroxy (Vit-D Deficiency, Fractures), BASIC METABOLIC PANEL WITH GFR  Essential hypertension - Plan: BASIC METABOLIC PANEL WITH GFR  Primary hyperparathyroidism ([place]) - Plan: Parathyroid hormone, intact (no Ca) Memory Compensation Strategies  1. Use "WARM" strategy.  W= write it down  A= associate it  R= repeat it  M= make a mental note  2.   You can keep a Social worker.  Use a 3-ring notebook with sections for the following: calendar, important names and phone numbers,  medications, doctors' names/phone numbers, lists/reminders, and a section to journal what you did  each day.   3.    Use a calendar to write appointments down.  4.    Write yourself a schedule for the day.  This can be placed on the calendar or in a separate section of the Memory Notebook.  Keeping a  regular schedule can help memory.  5.    Use medication organizer with sections for each day or morning/evening pills.  You may need help loading it  6.    Keep a basket, or pegboard by the door.  Place items that you need to take out with you in the basket or on the pegboard.  You may also want to  include a message board for reminders.  7.    Use sticky notes.  Place sticky notes with reminders in a place where the task is performed.  For example: " turn off the  stove" placed by the stove, "lock the door" placed on the door at eye level, " take your medications" on  the bathroom mirror or by the place where you normally take your medications.  8.    Use alarms/timers.  Use while cooking to remind yourself to check on food or as a reminder to take your medicine, or as a  reminder to make a call, or as a reminder to perform another task, etc.  If you need refills please call your pharmacy. Do not use My Chart to request refills or for  acute issues that need immediate attention.    Please be sure medication list is accurate. If a new problem present, please set up appointment sooner than planned today.  Dementia Dementia is a condition that affects the way the brain works. It often affects memory and thinking. There are many types of dementia. Some types get worse with time and cannot be reversed. Some types of dementia include:  Alzheimer's disease. This is the most common type.  Vascular dementia. This type may happen due to a stroke.  Lewy body dementia. This type may happen to people who have Parkinson's disease.  Frontotemporal dementia. This type is caused by damage to nerve cells in certain parts of the brain. Some people may have more than one type, and this is called mixed dementia. What are the causes? This condition is caused by damage to cells in the brain. Some causes that cannot be reversed include:  Having a condition that affects the blood vessels of the brain, such as diabetes, heart disease, or blood vessel disease.  Changes to genes. Some causes that can be reversed or slowed include:  Injury to the brain.  Certain medicines.  Infection.  Not having enough vitamin B12 in the body, or thyroid problems.  A tumor or blood clot in the brain. What are  the signs or symptoms? Symptoms depend on the type of dementia. This may include:  Problems remembering things.  Having trouble taking a bath or putting clothes on.  Forgetting appointments.  Forgetting to pay bills.  Trouble planning and making meals.  Having trouble speaking.  Getting lost easily. How is this treated? Treatment depends on the cause of the dementia. It might include taking medicines that help:  To control the dementia.  To slow down the dementia.  To manage symptoms. In some cases, treating the cause of your dementia can improve symptoms, reverse symptoms, or slow down how quickly it gets worse. Your doctor can  help you find support groups and other doctors who can help with your care. Follow these instructions at home: Medicines  Take over-the-counter and prescription medicines only as told by your doctor.  Use a pill organizer to help you manage your medicines.  Avoidtaking medicines for pain or for sleep. Lifestyle  Make healthy choices: ? Be active as told by your doctor. ? Do not use any products that contain nicotine or tobacco, such as cigarettes, e-cigarettes, and chewing tobacco. If you need help quitting, ask your doctor. ? Do not drink alcohol. ? When you get stressed, do something that will help you to relax. Your doctor can give you tips. ? Spend time with other people.  Make sure you get good sleep. To get good sleep: ? Try not to take naps during the day. ? Keep your bedroom dark and cool. ? In the few hours before you go to bed, try not to do any exercise. ? Do not have foods and drinks with caffeine at night. Eating and drinking  Drink enough fluid to keep your pee (urine) pale yellow.  Eat a healthy diet. General instructions   Talk with your doctor to figure out: ? What you need help with. ? What your safety needs are.  Ask your doctor if it is safe for you to drive.  If told, wear a bracelet that tracks where you are or shows that you are a person with memory loss.  Work with your family to make big decisions.  Keep all follow-up visits as told by your doctor. This is important. Contact a doctor if:  You have any new symptoms.  Your symptoms get worse.  You have problems with swallowing or choking. Get help right away if:  You feel very sad, or feel that you want to harm yourself.  You or your family members are worried for your safety. If you ever feel like you may hurt yourself or others, or have thoughts about taking your own life, get help right away. You can go to your nearest emergency department or call:  Your local emergency services (911  in the [place]  A suicide crisis helpline, such as the [facility] at 662 533 2208. This is open 24 hours a day. Summary  Dementia often affects memory and thinking.  Some types of dementia get worse with time and cannot be reversed.  Treatment for this condition depends on the cause.  Talk with your doctor to figure out what you need help with.  Your doctor can help you find support groups and other doctors who can help with your care. This information is not intended to replace advice given to you by your health care provider. Make sure you discuss any questions you have with your health care provider. Document Revised: 01/13/2019 Document Reviewed: 01/13/2019 Elsevier Patient Education  [place].

## 2020-07-03 ENCOUNTER — Encounter: Payer: Self-pay | Admitting: Family Medicine

## 2020-07-03 MED ORDER — LORAZEPAM 0.5 MG PO TABS: 0.2500 mg | ORAL_TABLET | Freq: Every day | ORAL | 0 refills | Status: DC | PRN

## 2020-07-04 LAB — PARATHYROID HORMONE, INTACT (NO CA): PTH: 186 pg/mL — ABNORMAL HIGH (ref 14–64)

## 2020-07-11 DIAGNOSIS — R6889 Other general symptoms and signs: Secondary | ICD-10-CM | POA: Insufficient documentation

## 2020-07-11 NOTE — Addendum Note (Signed)
Addended by: Martinique, Esterlene Atiyeh G on: 07/11/2020 07:50 AM   Modules accepted: Orders

## 2020-08-03 ENCOUNTER — Telehealth: Payer: Self-pay | Admitting: Family Medicine

## 2020-08-03 NOTE — Progress Notes (Signed)
  Chronic Care Management   Note  08/03/2020 Name: Annette [name] MRN: [id number] DOB: [birth year]  Annette [name] is a 86 y.o. year old female who is a primary care patient of Martinique, Malka So, MD. I reached out to [name] by phone today in response to a referral sent by Annette [name]'s PCP, Martin[place], [name], MD.   Annette [name] was given information about Chronic Care Management services today including:  1. CCM service includes personalized support from designated clinical staff supervised by her physician, including individualized plan of care and coordination with other care providers 2. 24/7 contact phone numbers for assistance for urgent and routine care needs. 3. Service will only be billed when office clinical staff spend 20 minutes or more in a month to coordinate care. 4. Only one practitioner may furnish and bill the service in a calendar month. 5. The patient may stop CCM services at any time (effective at the end of the month) by phone call to the office staff.   Patient agreed to services and verbal consent obtained.   Follow up plan:   Carley Perdue [employer] Scheduler

## 2020-08-09 ENCOUNTER — Ambulatory Visit (INDEPENDENT_AMBULATORY_CARE_PROVIDER_SITE_OTHER): Payer: Medicare HMO

## 2020-08-09 ENCOUNTER — Ambulatory Visit: Payer: Medicare HMO

## 2020-08-09 ENCOUNTER — Other Ambulatory Visit: Payer: Self-pay

## 2020-08-09 DIAGNOSIS — Z0000 Encounter for general adult medical examination without abnormal findings: Secondary | ICD-10-CM | POA: Diagnosis not present

## 2020-08-09 NOTE — Progress Notes (Signed)
Virtual Visit via Telephone Note  I connected with  Annette [name] on 08/09/20 at  3:15 PM EDT by telephone and verified that I am speaking with the correct person using two identifiers.  Medicare Annual Wellness visit completed telephonically due to Covid-19 pandemic.   Persons participating in this call: This Health Coach and this patient.   Location: Patient: Home Provider: Office   I discussed the limitations, risks, security and privacy concerns of performing an evaluation and management service by telephone and the availability of in person appointments. The patient expressed understanding and agreed to proceed.  Unable to perform video visit due to video visit attempted and failed and/or patient does not have video capability.   Some vital signs may be absent or patient reported.   [name], LPN    Subjective:   Annette [name] is a 86 y.o. female who presents for Medicare Annual (Subsequent) preventive examination.  Review of Systems     Cardiac Risk Factors include: advanced age (>55men, >53 women);obesity (BMI >30kg/m2);hypertension;sedentary lifestyle     Objective:    There were no vitals filed for this visit. There is no height or weight on file to calculate BMI.  Advanced Directives 08/09/2020 11/13/2019 11/12/2019 08/14/2018 07/22/2018 11/11/2016 07/20/2014  Does Patient Have a Medical Advance Directive? Yes No No No No No No  Type of Paramedic of [place];Living will - - - - - -  Copy of [place] in Chart? No - copy requested - - - - - -  Would patient like information on creating a medical advance directive? - No - Patient declined No - Patient declined - No - Patient declined - No - patient declined information    Current Medications (verified) Outpatient Encounter Medications as of 08/09/2020  Medication Sig  . acetaminophen (TYLENOL) 500 MG tablet Take 500 mg by mouth every 6 (six) hours as needed  (arthritis pain).  Marland Kitchen amLODipine (NORVASC) 5 MG tablet TAKE 1 TABLET EVERY DAY  . benazepril (LOTENSIN) 40 MG tablet Take 1 tablet (40 mg total) by mouth daily.  . furosemide (LASIX) 40 MG tablet Take 1 tablet 3 times weekly  . latanoprost (XALATAN) 0.005 % ophthalmic solution INSTILL 1 DROP INTO BOTH EYES AT BEDTIME (Patient taking differently: Place 1 drop into both eyes at bedtime. )  . omeprazole (PRILOSEC) 20 MG capsule TAKE 1 CAPSULE EVERY DAY  . vitamin B-12 (CYANOCOBALAMIN) 1000 MCG tablet Take 1,000 mcg by mouth 2 (two) times a week.  Marland Kitchen VITAMIN D PO Take 2,000 Units by mouth daily.   . [DISCONTINUED] LORazepam (ATIVAN) 0.5 MG tablet Take 0.5-1 tablets (0.25-0.5 mg total) by mouth daily as needed for anxiety. (Patient not taking: Reported on 08/09/2020)  . [DISCONTINUED] meclizine (ANTIVERT) 25 MG tablet Take 1 tablet (25 mg total) by mouth 4 (four) times daily as needed. (Patient not taking: Reported on 08/09/2020)  . [DISCONTINUED] mupirocin ointment (BACTROBAN) 2 % APPLY 1 APPLICATION TOPICALLY 2 (TWO) TIMES DAILY FOR 10 DAYS. (Patient not taking: Reported on 08/09/2020)  . [DISCONTINUED] Respiratory Therapy Supplies (ONE FLOW SPIROMETER) DEVI 1 Device by Does not apply route 3 (three) times daily as needed. (Patient not taking: Reported on 08/09/2020)   No facility-administered encounter medications on file as of 08/09/2020.    Allergies (verified) Codeine   History: Past Medical History:  Diagnosis Date  . GERD 10/03/2007  . HYPERTENSION 10/03/2007  . OSTEOPENIA 02/03/2008  . Primary hyperparathyroidism ([place]) 10/03/2007  Past Surgical History:  Procedure Laterality Date  . ABDOMINAL HYSTERECTOMY     History reviewed. No pertinent family history. Social History   Socioeconomic History  . Marital status: Widowed    Spouse name: Not on file  . Number of children: Not on file  . Years of education: Not on file  . Highest education level: Not on file  Occupational History   . Occupation: retired  Tobacco Use  . Smoking status: Former Smoker    Quit date: 11/12/1978    Years since quitting: 41.7  . Smokeless tobacco: Never Used  Substance and Sexual Activity  . Alcohol use: No  . Drug use: No  . Sexual activity: Not on file  Other Topics Concern  . Not on file  Social History Narrative   Pt states her son and daughter in law live with her   Social Determinants of Health   Financial Resource Strain: Low Risk   . Difficulty of Paying Living Expenses: Not hard at all  Food Insecurity: No Food Insecurity  . Worried About Charity fundraiser in the Last Year: Never true  . Ran Out of Food in the Last Year: Never true  Transportation Needs: No Transportation Needs  . Lack of Transportation (Medical): No  . Lack of Transportation (Non-Medical): No  Physical Activity: Inactive  . Days of Exercise per Week: 0 days  . Minutes of Exercise per Session: 0 min  Stress: No Stress Concern Present  . Feeling of Stress : Not at all  Social Connections: Socially Isolated  . Frequency of Communication with Friends and Family: More than three times a week  . Frequency of Social Gatherings with Friends and Family: Never  . Attends Religious Services: Never  . Active Member of Clubs or Organizations: No  . Attends Archivist Meetings: Never  . Marital Status: Widowed    Tobacco Counseling Counseling given: Not Answered   Clinical Intake:  Pre-visit preparation completed: Yes  Pain : No/denies pain     BMI - recorded: 30.83 Nutritional Status: BMI > 30  Obese Nutritional Risks: None Diabetes: No  How often do you need to have someone help you when you read instructions, pamphlets, or other written materials from your doctor or pharmacy?: 1 - Never  Diabetic?No  Interpreter Needed?: No  Information entered by :: Charlott Rakes, LPN   Activities of Daily Living In your present state of health, do you have any difficulty performing the  following activities: 08/09/2020 11/13/2019  Hearing? Y N  Comment mild loss -  Vision? N N  Difficulty concentrating or making decisions? N N  Walking or climbing stairs? N Y  Dressing or bathing? N N  Doing errands, shopping? N N  Preparing Food and eating ? N -  Using the Toilet? N -  In the past six months, have you accidently leaked urine? N -  Do you have problems with loss of bowel control? N -  Managing your Medications? N -  Managing your Finances? N -  Housekeeping or managing your Housekeeping? N -  Some recent data might be hidden    Patient Care Team: [place], [name], MD as PCP - General (Family Medicine) Viona Gilmore, [facility] as Pharmacist (Pharmacist)  Indicate any recent Medical Services you may have received from other than [facility] providers in the past year (date may be approximate).     Assessment:   This is a routine wellness examination for [place].  Hearing/Vision screen  Hearing Screening   125Hz  250Hz  500Hz  1000Hz  2000Hz  3000Hz  4000Hz  6000Hz  8000Hz   Right ear:           Left ear:           Comments: Pt state mild hearing loss no hearing aids  Vision Screening Comments: Pt follow up with eye exams with [facility] Dr Tama High  Dietary issues and exercise activities discussed: Current Exercise Habits: The patient does not participate in regular exercise at present (does a little walking around in yard)  Goals    . Patient Stated     Stay healthy and positive thinking      Depression Screen PHQ 2/9 Scores 08/09/2020 07/17/2019 08/22/2018 05/23/2016 07/05/2015 01/20/2014  PHQ - 2 Score 0 0 0 0 0 0    Fall Risk Fall Risk  08/09/2020 07/17/2019 08/22/2018 05/23/2016 07/05/2015  Falls in the past year? 0 0 No No No  Number falls in past yr: 0 0 - - -  Injury with Fall? 0 0 - - -  Risk for fall due to : Impaired balance/gait;Impaired mobility Orthopedic patient;Impaired balance/gait - - -  Follow up Falls prevention discussed - - - -    Any stairs  in or around the home? No  If so, are there any without handrails? No  Home free of loose throw rugs in walkways, pet beds, electrical cords, etc? Yes  Adequate lighting in your home to reduce risk of falls? Yes   ASSISTIVE DEVICES UTILIZED TO PREVENT FALLS:  Life alert? No  Use of a cane, walker or w/c? Yes  Grab bars in the bathroom? Yes  Shower chair or bench in shower? Yes  Elevated toilet seat or a handicapped toilet? No   TIMED UP AND GO:  Was the test performed? No . Cognitive Function:   Montreal Cognitive Assessment  07/03/2020  Visuospatial/ Executive (0/5) 5  Naming (0/3) 3  Attention: Read list of digits (0/2) 2  Attention: Read list of letters (0/1) 1  Attention: Serial 7 subtraction starting at 100 (0/3) 2  Language: Repeat phrase (0/2) 2  Language : Fluency (0/1) 1  Abstraction (0/2) 2  Delayed Recall (0/5) 5  Orientation (0/6) 6  Total 29   6CIT Screen 08/09/2020  What Year? (No Data)    Immunizations Immunization History  Administered Date(s) Administered  . Fluad Quad(high Dose 65+) 07/15/2019  . Influenza Split 08/22/2011  . Influenza Whole 08/10/2010  . Influenza, High Dose Seasonal PF 08/04/2014, 08/05/2016, 08/04/2017, 07/24/2018  . Influenza,inj,Quad PF,6+ Mos 08/10/2013  . Influenza-Unspecified 07/27/2017  . PFIZER SARS-COV-2 Vaccination 01/10/2020, 02/03/2020  . Pneumococcal Conjugate-13 08/10/2014  . Pneumococcal Polysaccharide-23 08/10/2013    TDAP status: Due, Education has been provided regarding the importance of this vaccine. Advised may receive this vaccine at local pharmacy or Health Dept. Aware to provide a copy of the vaccination record if obtained from local pharmacy or Health Dept. Verbalized acceptance and understanding. Flu Vaccine status: Declined, Education has been provided regarding the importance of this vaccine but patient still declined. Advised may receive this vaccine at local pharmacy or Health Dept. Aware to provide a  copy of the vaccination record if obtained from local pharmacy or Health Dept. Verbalized acceptance and understanding. Pneumococcal vaccine status: Up to date Covid-19 vaccine status: Completed vaccines  Qualifies for Shingles Vaccine? Yes   Zostavax completed No   Shingrix Completed?: No.    Education has been provided regarding the importance of this vaccine. Patient has been advised  to call insurance company to determine out of pocket expense if they have not yet received this vaccine. Advised may also receive vaccine at local pharmacy or Health Dept. Verbalized acceptance and understanding.  Screening Tests Health Maintenance  Topic Date Due  . INFLUENZA VACCINE  06/12/2020  . COVID-19 Vaccine  Completed  . PNA vac Low Risk Adult  Completed  . DEXA SCAN  Discontinued  . TETANUS/TDAP  Discontinued    Health Maintenance  Health Maintenance Due  Topic Date Due  . INFLUENZA VACCINE  06/12/2020    Colorectal cancer screening: No longer required.  Mammogram status: No longer required.  Bone scan declined     Additional Screening:   Vision Screening: Recommended annual ophthalmology exams for early detection of glaucoma and other disorders of the eye. Is the patient up to date with their annual eye exam?  Yes  Who is the provider or what is the name of the office in which the patient attends annual eye exams? Dr Tama High   Dental Screening: Recommended annual dental exams for proper oral hygiene  Community Resource Referral / Chronic Care Management: CRR required this visit?  No   CCM required this visit?  No      Plan:     I have personally reviewed and noted the following in the patient's chart:   . Medical and social history . Use of alcohol, tobacco or illicit drugs  . Current medications and supplements . Functional ability and status . Nutritional status . Physical activity . Advanced directives . List of other physicians . Hospitalizations,  surgeries, and ER visits in previous 12 months . Vitals . Screenings to include cognitive, depression, and falls . Referrals and appointments  In addition, I have reviewed and discussed with patient certain preventive protocols, quality metrics, and best practice recommendations. A written personalized care plan for preventive services as well as general preventive health recommendations were provided to patient.     [name], LPN   [id number]   Nurse Notes: None

## 2020-08-09 NOTE — Patient Instructions (Addendum)
Annette [name] , Thank you for taking time to come for your Medicare Wellness Visit. I appreciate your ongoing commitment to your health goals. Please review the following plan we discussed and let me know if I can assist you in the future.   Screening recommendations/referrals: Colonoscopy: No Longer Required  Mammogram: No longer Required Bone Density: Declined Recommended yearly ophthalmology/optometry visit for glaucoma screening and checkup Recommended yearly dental visit for hygiene and checkup  Vaccinations: Influenza vaccine: Done 07/15/19 pt states has appt 08/31/20 Pneumococcal vaccine: Up to date Tdap vaccine: Due and discussed Shingles vaccine: Shingrix discussed. Please contact your pharmacy for coverage information.    Covid-19:Completed 2/28 & 02/03/20  Advanced directives: Please bring a copy of your health care power of attorney and living will to the office at your convenience.  Conditions/risks identified: Stay healthy and positive thinking  Next appointment: Follow up in one year for your annual wellness visit   Preventive Care 65 Years and Older, Female Preventive care refers to lifestyle choices and visits with your health care provider that can promote health and wellness. What does preventive care include?  A yearly physical exam. This is also called an annual well check.  Dental exams once or twice a year.  Routine eye exams. Ask your health care provider how often you should have your eyes checked.  Personal lifestyle choices, including:  Daily care of your teeth and gums.  Regular physical activity.  Eating a healthy diet.  Avoiding tobacco and drug use.  Limiting alcohol use.  Practicing safe sex.  Taking low-dose aspirin every day.  Taking vitamin and mineral supplements as recommended by your health care provider. What happens during an annual well check? The services and screenings done by your health care provider during your annual well  check will depend on your age, overall health, lifestyle risk factors, and family history of disease. Counseling  Your health care provider may ask you questions about your:  Alcohol use.  Tobacco use.  Drug use.  Emotional well-being.  Home and relationship well-being.  Sexual activity.  Eating habits.  History of falls.  Memory and ability to understand (cognition).  Work and work Statistician.  Reproductive health. Screening  You may have the following tests or measurements:  Height, weight, and BMI.  Blood pressure.  Lipid and cholesterol levels. These may be checked every 5 years, or more frequently if you are over 38 years old.  Skin check.  Lung cancer screening. You may have this screening every year starting at age 65 if you have a 30-pack-year history of smoking and currently smoke or have quit within the past 15 years.  Fecal occult blood test (FOBT) of the stool. You may have this test every year starting at age 70.  Flexible sigmoidoscopy or colonoscopy. You may have a sigmoidoscopy every 5 years or a colonoscopy every 10 years starting at age 57.  Hepatitis C blood test.  Hepatitis B blood test.  Sexually transmitted disease (STD) testing.  Diabetes screening. This is done by checking your blood sugar (glucose) after you have not eaten for a while (fasting). You may have this done every 1-3 years.  Bone density scan. This is done to screen for osteoporosis. You may have this done starting at age 32.  Mammogram. This may be done every 1-2 years. Talk to your health care provider about how often you should have regular mammograms. Talk with your health care provider about your test results, treatment options, and if necessary,  the need for more tests. Vaccines  Your health care provider may recommend certain vaccines, such as:  Influenza vaccine. This is recommended every year.  Tetanus, diphtheria, and acellular pertussis (Tdap, Td) vaccine. You  may need a Td booster every 10 years.  Zoster vaccine. You may need this after age 91.  Pneumococcal 13-valent conjugate (PCV13) vaccine. One dose is recommended after age 50.  Pneumococcal polysaccharide (PPSV23) vaccine. One dose is recommended after age 57. Talk to your health care provider about which screenings and vaccines you need and how often you need them. This information is not intended to replace advice given to you by your health care provider. Make sure you discuss any questions you have with your health care provider. Document Released: 11/25/2015 Document Revised: 07/18/2016 Document Reviewed: 08/30/2015 Elsevier Interactive Patient Education  2017 [employer] Prevention in the Home Falls can cause injuries. They can happen to people of all ages. There are many things you can do to make your home safe and to help prevent falls. What can I do on the outside of my home?  Regularly fix the edges of walkways and driveways and fix any cracks.  Remove anything that might make you trip as you walk through a door, such as a raised step or threshold.  Trim any bushes or trees on the path to your home.  Use bright outdoor lighting.  Clear any walking paths of anything that might make someone trip, such as rocks or tools.  Regularly check to see if handrails are loose or broken. Make sure that both sides of any steps have handrails.  Any raised decks and porches should have guardrails on the edges.  Have any leaves, snow, or ice cleared regularly.  Use sand or salt on walking paths during winter.  Clean up any spills in your garage right away. This includes oil or grease spills. What can I do in the bathroom?  Use night lights.  Install grab bars by the toilet and in the tub and shower. Do not use towel bars as grab bars.  Use non-skid mats or decals in the tub or shower.  If you need to sit down in the shower, use a plastic, non-slip stool.  Keep the floor  dry. Clean up any water that spills on the floor as soon as it happens.  Remove soap buildup in the tub or shower regularly.  Attach bath mats securely with double-sided non-slip rug tape.  Do not have throw rugs and other things on the floor that can make you trip. What can I do in the bedroom?  Use night lights.  Make sure that you have a light by your bed that is easy to reach.  Do not use any sheets or blankets that are too big for your bed. They should not hang down onto the floor.  Have a firm chair that has side arms. You can use this for support while you get dressed.  Do not have throw rugs and other things on the floor that can make you trip. What can I do in the kitchen?  Clean up any spills right away.  Avoid walking on wet floors.  Keep items that you use a lot in easy-to-reach places.  If you need to reach something above you, use a strong step stool that has a grab bar.  Keep electrical cords out of the way.  Do not use floor polish or wax that makes floors slippery. If you must use  wax, use non-skid floor wax.  Do not have throw rugs and other things on the floor that can make you trip. What can I do with my stairs?  Do not leave any items on the stairs.  Make sure that there are handrails on both sides of the stairs and use them. Fix handrails that are broken or loose. Make sure that handrails are as long as the stairways.  Check any carpeting to make sure that it is firmly attached to the stairs. Fix any carpet that is loose or worn.  Avoid having throw rugs at the top or bottom of the stairs. If you do have throw rugs, attach them to the floor with carpet tape.  Make sure that you have a light switch at the top of the stairs and the bottom of the stairs. If you do not have them, ask someone to add them for you. What else can I do to help prevent falls?  Wear shoes that:  Do not have high heels.  Have rubber bottoms.  Are comfortable and fit you  well.  Are closed at the toe. Do not wear sandals.  If you use a stepladder:  Make sure that it is fully opened. Do not climb a closed stepladder.  Make sure that both sides of the stepladder are locked into place.  Ask someone to hold it for you, if possible.  Clearly mark and make sure that you can see:  Any grab bars or handrails.  First and last steps.  Where the edge of each step is.  Use tools that help you move around (mobility aids) if they are needed. These include:  Canes.  Walkers.  Scooters.  Crutches.  Turn on the lights when you go into a dark area. Replace any light bulbs as soon as they burn out.  Set up your furniture so you have a clear path. Avoid moving your furniture around.  If any of your floors are uneven, fix them.  If there are any pets around you, be aware of where they are.  Review your medicines with your doctor. Some medicines can make you feel dizzy. This can increase your chance of falling. Ask your doctor what other things that you can do to help prevent falls. This information is not intended to replace advice given to you by your health care provider. Make sure you discuss any questions you have with your health care provider. Document Released: 08/25/2009 Document Revised: 04/05/2016 Document Reviewed: 12/03/2014 Elsevier Interactive Patient Education  2017 [employer].

## 2020-08-11 ENCOUNTER — Encounter: Payer: Self-pay | Admitting: Family Medicine

## 2020-08-19 ENCOUNTER — Ambulatory Visit: Payer: Medicare HMO | Admitting: Family Medicine

## 2020-08-30 ENCOUNTER — Encounter: Payer: Self-pay | Admitting: Family Medicine

## 2020-08-31 ENCOUNTER — Ambulatory Visit (INDEPENDENT_AMBULATORY_CARE_PROVIDER_SITE_OTHER): Payer: Medicare HMO | Admitting: Family Medicine

## 2020-08-31 ENCOUNTER — Encounter: Payer: Self-pay | Admitting: Family Medicine

## 2020-08-31 ENCOUNTER — Other Ambulatory Visit: Payer: Self-pay

## 2020-08-31 VITALS — BP 130/80 | HR 71 | Resp 16 | Ht 63.0 in | Wt 171.0 lb

## 2020-08-31 DIAGNOSIS — F419 Anxiety disorder, unspecified: Secondary | ICD-10-CM | POA: Diagnosis not present

## 2020-08-31 DIAGNOSIS — Z0000 Encounter for general adult medical examination without abnormal findings: Secondary | ICD-10-CM

## 2020-08-31 DIAGNOSIS — R6889 Other general symptoms and signs: Secondary | ICD-10-CM

## 2020-08-31 DIAGNOSIS — Z23 Encounter for immunization: Secondary | ICD-10-CM | POA: Diagnosis not present

## 2020-08-31 DIAGNOSIS — I10 Essential (primary) hypertension: Secondary | ICD-10-CM

## 2020-08-31 NOTE — Patient Instructions (Addendum)
A few things to remember from today's visit:  A few tips:  -As we age balance is not as good as it was, so there is a higher risks for falls. Please remove small rugs and furniture that is "in your way" and could increase the risk of falls. Stretching exercises may help with fall prevention: Yoga and Tai Chi are some examples. Low impact exercise is better, so you are not very achy the next day.  -Sun screen and avoidance of direct sun light recommended. Caution with dehydration, if working outdoors be sure to drink enough fluids.  - Some medications are not safe as we age, increases the risk of side effects and can potentially interact with other medication you are also taken;  including some of over the counter medications. Be sure to let me know when you start a new medication even if it is a dietary/vitamin supplement.   -Healthy diet low in red meet/animal fat and sugar + regular physical activity is recommended.    No changes today.  If you need refills please call your pharmacy. Do not use My Chart to request refills or for acute issues that need immediate attention.    Please be sure medication list is accurate. If a new problem present, please set up appointment sooner than planned today.

## 2020-08-31 NOTE — Assessment & Plan Note (Signed)
BP is adequately controlled. Continue amlodipine 5 mg daily and benazepril 40 mg daily. Low-salt diet also recommended. Eye exam is current.

## 2020-08-31 NOTE — Progress Notes (Signed)
HPI: [name] is a 86 y.o. female, who is here today for CPE and follow up.   She was last seen on 07/01/2020, when her son was concerned about some memory issues.  She does not think she has problems with her memory.  Her son is not here today. Last CPE on 05/23/2016. Last AWV 08/09/2020.  No new problems since her last visit.  She lives with her son and her daughter-in-law. She has been consistent with following a healthful diet. She walks 1-2 times per day on her yard.  Chronic medical problems: Generalized OA, anxiety, hypertension, primary hyperparathyroidism, history of PE, and vitamin D deficiency among some.  She has a walker to assist with transfers.  She no longer having PT at home. No falls since the last visit.  She has a bedside commode and a shower chair. She is able to get in and get out of the shower by herself. She cooks her meals and watches her clothes. Takes the trash out and sometimes she brings the trash can back in the afternoon. She does not drive.  She goes to bed about 9 PM and stays in bed until 11:30 AM.  She is not sure about how many hours she sleeps, wakes up a few times throughout the night. Nocturia x3. Since her last visit she has tried to list things she needs to do throughout the day, she has noted that she does pretty much the same things daily.  She has a scheduled routine.  She enjoys watching the gospel on TV. She avoids watching the news, stressful. Speaks with friends and family on the phone.  Immunization History  Administered Date(s) Administered  . Fluad Quad(high Dose 65+) 07/15/2019, 08/31/2020  . Influenza Split 08/22/2011  . Influenza Whole 08/10/2010  . Influenza, High Dose Seasonal PF 08/04/2014, 08/05/2016, 08/04/2017, 07/24/2018  . Influenza,inj,Quad PF,6+ Mos 08/10/2013  . Influenza-Unspecified 07/27/2017  . PFIZER SARS-COV-2 Vaccination 01/10/2020, 02/03/2020  . Pneumococcal Conjugate-13 08/10/2014  .  Pneumococcal Polysaccharide-23 08/10/2013  . Tdap 08/31/2020   She has some questions about vitamin B12, which was recommended because B12 on lower normal range. She is taking B12 1000 mcg twice per week, she wonders if she can take this daily. She is on vitamin D supplementation, 2000 units daily. Cognitive evaluation last visit was otherwise normal, she was not interested in being evaluated by neurologist.  Lab Results  Component Value Date   VITAMINB12 379 07/01/2020    Lab Results  Component Value Date   TSH 1.63 05/20/2018   Knee OA: Exacerbated by prolonged standing and walking.  She takes Tylenol 500 mg twice daily. Hypertension: Currently she is on amlodipine 5 mg daily and benazepril 40 mg daily. Anxiety: She is not on pharmacologic treatment. In general she feels like she is dealing well with the stress.  Review of Systems  Constitutional: Positive for fatigue. Negative for activity change, appetite change and fever.  HENT: Negative for mouth sores, nosebleeds and sore throat.   Eyes: Negative for redness and visual disturbance.  Respiratory: Negative for cough, shortness of breath and wheezing.   Cardiovascular: Negative for chest pain, palpitations and leg swelling.  Gastrointestinal: Negative for abdominal pain, nausea and vomiting.       Negative for changes in bowel habits.  Genitourinary: Negative for decreased urine volume, dysuria and hematuria.  Musculoskeletal: Positive for arthralgias, back pain and gait problem.  Allergic/Immunologic: Positive for environmental allergies.  Neurological: Negative for syncope, weakness  and headaches.  Psychiatric/Behavioral: Negative for confusion and hallucinations. The patient is nervous/anxious.   All other systems reviewed and are negative.  Current Outpatient Medications on File Prior to Visit  Medication Sig Dispense Refill  . acetaminophen (TYLENOL) 500 MG tablet Take 500 mg by mouth every 6 (six) hours as needed  (arthritis pain).    Marland Kitchen amLODipine (NORVASC) 5 MG tablet TAKE 1 TABLET EVERY DAY 90 tablet 1  . benazepril (LOTENSIN) 40 MG tablet Take 1 tablet (40 mg total) by mouth daily. 90 tablet 3  . furosemide (LASIX) 40 MG tablet Take 1 tablet 3 times weekly 30 tablet 3  . latanoprost (XALATAN) 0.005 % ophthalmic solution INSTILL 1 DROP INTO BOTH EYES AT BEDTIME (Patient taking differently: Place 1 drop into both eyes at bedtime. ) 7.5 mL 3  . omeprazole (PRILOSEC) 20 MG capsule TAKE 1 CAPSULE EVERY DAY 90 capsule 3  . vitamin B-12 (CYANOCOBALAMIN) 1000 MCG tablet Take 1,000 mcg by mouth 2 (two) times a week.    Marland Kitchen VITAMIN D PO Take 2,000 Units by mouth daily.      No current facility-administered medications on file prior to visit.     Past Medical History:  Diagnosis Date  . GERD 10/03/2007  . HYPERTENSION 10/03/2007  . OSTEOPENIA 02/03/2008  . Primary hyperparathyroidism ([place]) 10/03/2007   Allergies  Allergen Reactions  . Codeine Nausea And Vomiting    Social History   Socioeconomic History  . Marital status: Widowed    Spouse name: Not on file  . Number of children: Not on file  . Years of education: Not on file  . Highest education level: Not on file  Occupational History  . Occupation: retired  Tobacco Use  . Smoking status: Former Smoker    Quit date: 11/12/1978    Years since quitting: 41.8  . Smokeless tobacco: Never Used  Substance and Sexual Activity  . Alcohol use: No  . Drug use: No  . Sexual activity: Not on file  Other Topics Concern  . Not on file  Social History Narrative   Pt states her son and daughter in law live with her   Social Determinants of Health   Financial Resource Strain: Low Risk   . Difficulty of Paying Living Expenses: Not hard at all  Food Insecurity: No Food Insecurity  . Worried About Charity fundraiser in the Last Year: Never true  . Ran Out of Food in the Last Year: Never true  Transportation Needs: No Transportation Needs  . Lack of  Transportation (Medical): No  . Lack of Transportation (Non-Medical): No  Physical Activity: Inactive  . Days of Exercise per Week: 0 days  . Minutes of Exercise per Session: 0 min  Stress: No Stress Concern Present  . Feeling of Stress : Not at all  Social Connections: Socially Isolated  . Frequency of Communication with Friends and Family: More than three times a week  . Frequency of Social Gatherings with Friends and Family: Never  . Attends Religious Services: Never  . Active Member of Clubs or Organizations: No  . Attends Archivist Meetings: Never  . Marital Status: Widowed    Vitals:   08/31/20 1108  BP: 130/80  Pulse: 71  Resp: 16  SpO2: 96%   Wt Readings from Last 3 Encounters:  08/31/20 171 lb (77.6 kg)  07/01/20 174 lb (78.9 kg)  04/05/20 177 lb 6 oz (80.5 kg)    Body mass index is 30.29 kg/m.  Physical Exam Vitals and nursing note reviewed.  Constitutional:      General: She is not in acute distress.    Appearance: She is well-developed.  HENT:     Head: Normocephalic and atraumatic.     Mouth/Throat:     Mouth: Mucous membranes are moist.     Pharynx: Oropharynx is clear.  Eyes:     Conjunctiva/sclera: Conjunctivae normal.     Pupils: Pupils are equal, round, and reactive to light.     Comments: Right pupil slightly bigger,not rounded.  Cardiovascular:     Rate and Rhythm: Normal rate and regular rhythm.     Heart sounds: No murmur heard.      Comments: DP pulses present. Pulmonary:     Effort: Pulmonary effort is normal. No respiratory distress.     Breath sounds: Normal breath sounds.  Abdominal:     Palpations: Abdomen is soft. There is no mass.     Tenderness: There is no abdominal tenderness.  Lymphadenopathy:     Cervical: No cervical adenopathy.  Skin:    General: Skin is warm.     Findings: No erythema or rash.  Neurological:     General: No focal deficit present.     Mental Status: She is alert and oriented to person,  place, and time.     Comments: Gait assisted by a walker.  Psychiatric:        Mood and Affect: Mood is anxious. Mood is not depressed.     Comments: Well groomed, good eye contact.   ASSESSMENT AND PLAN:   Ms. CAROLIE MCILRATH was seen today for CPE and  follow-up.  Orders Placed This Encounter  Procedures  . Tdap vaccine greater than or equal to 84yo IM  . Flu Vaccine QUAD High Dose(Fluad)  . TSH   Lab Results  Component Value Date   TSH 1.27 08/31/2020    Routine general medical examination at a health care facility We discussed the importance of staying active, physically and mentally, as well as the benefits of a healthy/balance diet. Low impact exercise that involve stretching and strengthing are ideal. Vaccines updated before I saw the patient. Fall precautions discussed.  Need for Tdap vaccination -     Tdap vaccine greater than or equal to 84yo IM  Need for influenza vaccination -     Flu Vaccine QUAD High Dose(Fluad)  Forgetfulness No significant cognitive abnormalities were found on examination today. Last visit cognitive evaluation was otherwise normal. Oriented x3. Recommend memory challenging games. Continue writing things down.  Anxiety disorder, unspecified Problem is is stable. I do not think medication is needed at this time. We will continue following periodically.  Essential hypertension BP is adequately controlled. Continue amlodipine 5 mg daily and benazepril 40 mg daily. Low-salt diet also recommended. Eye exam is current.   Return in about 17 weeks (around 12/28/2020) for HTN,hyperparathyroid,vit D,anxiety.  Ronnell Makarewicz G. Martinique, MD  [facility]. [place] office.  A few things to remember from today's visit:  A few tips:  -As we age balance is not as good as it was, so there is a higher risks for falls. Please remove small rugs and furniture that is "in your way" and could increase the risk of falls. Stretching exercises may  help with fall prevention: Yoga and Tai Chi are some examples. Low impact exercise is better, so you are not very achy the next day.  -Sun screen and avoidance of direct sun light recommended. Caution  with dehydration, if working outdoors be sure to drink enough fluids.  - Some medications are not safe as we age, increases the risk of side effects and can potentially interact with other medication you are also taken;  including some of over the counter medications. Be sure to let me know when you start a new medication even if it is a dietary/vitamin supplement.   -Healthy diet low in red meet/animal fat and sugar + regular physical activity is recommended.    No changes today.  If you need refills please call your pharmacy. Do not use My Chart to request refills or for acute issues that need immediate attention.    Please be sure medication list is accurate. If a new problem present, please set up appointment sooner than planned today.              

## 2020-08-31 NOTE — Assessment & Plan Note (Signed)
Problem is is stable. I do not think medication is needed at this time. We will continue following periodically.

## 2020-08-31 NOTE — Assessment & Plan Note (Signed)
No significant cognitive abnormalities were found on examination today. Last visit cognitive evaluation was otherwise normal. Oriented x3. Recommend memory challenging games. Continue writing things down.

## 2020-09-01 LAB — TSH: TSH: 1.27 mIU/L (ref 0.40–4.50)

## 2020-09-05 ENCOUNTER — Other Ambulatory Visit: Payer: Self-pay | Admitting: Family Medicine

## 2020-09-05 ENCOUNTER — Ambulatory Visit: Payer: Medicare HMO | Admitting: Family Medicine

## 2020-09-10 ENCOUNTER — Other Ambulatory Visit: Payer: Self-pay | Admitting: Family Medicine

## 2020-10-03 ENCOUNTER — Telehealth: Payer: Medicare HMO

## 2020-10-10 ENCOUNTER — Other Ambulatory Visit: Payer: Self-pay | Admitting: Family Medicine

## 2020-10-10 MED ORDER — AMLODIPINE BESYLATE 5 MG PO TABS
5.0000 mg | ORAL_TABLET | Freq: Every day | ORAL | 1 refills | Status: DC
Start: 2020-10-10 — End: 2021-03-01

## 2020-10-10 NOTE — Telephone Encounter (Signed)
Rx sent in

## 2020-10-10 NOTE — Telephone Encounter (Signed)
amLODipine (NORVASC) 5 MG tablet  [place], [place] Phone:  [phone]  Fax:  [phone]

## 2020-11-25 DIAGNOSIS — U071 COVID-19: Secondary | ICD-10-CM | POA: Diagnosis not present

## 2020-11-25 DIAGNOSIS — R059 Cough, unspecified: Secondary | ICD-10-CM | POA: Diagnosis not present

## 2020-11-29 ENCOUNTER — Telehealth: Payer: Self-pay | Admitting: Family Medicine

## 2020-11-29 NOTE — Telephone Encounter (Signed)
fyi

## 2020-11-29 NOTE — Telephone Encounter (Signed)
Patient is calling and wanted to let the provider know that she tested positive for covid. CB is [phone]

## 2020-11-30 ENCOUNTER — Encounter: Payer: Self-pay | Admitting: Family Medicine

## 2020-12-02 ENCOUNTER — Encounter: Payer: Self-pay | Admitting: Family Medicine

## 2020-12-03 ENCOUNTER — Encounter: Payer: Self-pay | Admitting: Internal Medicine

## 2020-12-03 ENCOUNTER — Telehealth (INDEPENDENT_AMBULATORY_CARE_PROVIDER_SITE_OTHER): Payer: Medicare HMO | Admitting: Internal Medicine

## 2020-12-03 ENCOUNTER — Other Ambulatory Visit: Payer: Self-pay

## 2020-12-03 DIAGNOSIS — U071 COVID-19: Secondary | ICD-10-CM | POA: Diagnosis not present

## 2020-12-03 NOTE — Progress Notes (Signed)
Subjective:    Patient ID: Annette [name], female    DOB: 19-Mar-1930, 87 y.o.   MRN: [id number]  HPI Virtual visit to review potential treatments since she is COVID positive Identification done Reviewed limitations and billing and she gave consent Participants---patient in her home and I am in my office  Son was COVID positive--so she went to urgent care to be tested 1/14 This turned out positive Has been quarantined in bedroom since that time  Just has a tickle in her throat Oxygen levels okay-- 95-98% No fever No SOB Has arthritis--but no myalgias or cramps  Current Outpatient Medications on File Prior to Visit  Medication Sig Dispense Refill  . acetaminophen (TYLENOL) 500 MG tablet Take 500 mg by mouth every 6 (six) hours as needed (arthritis pain).    Marland Kitchen amLODipine (NORVASC) 5 MG tablet Take 1 tablet (5 mg total) by mouth daily. 90 tablet 1  . benazepril (LOTENSIN) 40 MG tablet TAKE 1 TABLET EVERY DAY 90 tablet 3  . furosemide (LASIX) 40 MG tablet TAKE 1 TABLET THREE TIMES WEEKLY 39 tablet 3  . latanoprost (XALATAN) 0.005 % ophthalmic solution INSTILL 1 DROP INTO BOTH EYES AT BEDTIME (Patient taking differently: Place 1 drop into both eyes at bedtime.) 7.5 mL 3  . omeprazole (PRILOSEC) 20 MG capsule TAKE 1 CAPSULE EVERY DAY 90 capsule 3  . vitamin B-12 (CYANOCOBALAMIN) 1000 MCG tablet Take 1,000 mcg by mouth 2 (two) times a week.    Marland Kitchen VITAMIN D PO Take 2,000 Units by mouth daily.     . Zinc 30 MG TABS Take by mouth.     No current facility-administered medications on file prior to visit.    Allergies  Allergen Reactions  . Codeine Nausea And Vomiting    Past Medical History:  Diagnosis Date  . GERD 10/03/2007  . HYPERTENSION 10/03/2007  . OSTEOPENIA 02/03/2008  . Primary hyperparathyroidism ([place]) 10/03/2007    Past Surgical History:  Procedure Laterality Date  . ABDOMINAL HYSTERECTOMY      History reviewed. No pertinent family history.  Social History    Socioeconomic History  . Marital status: Widowed    Spouse name: Not on file  . Number of children: Not on file  . Years of education: Not on file  . Highest education level: Not on file  Occupational History  . Occupation: retired  Tobacco Use  . Smoking status: Former Smoker    Quit date: 11/12/1978    Years since quitting: 42.0  . Smokeless tobacco: Never Used  Substance and Sexual Activity  . Alcohol use: No  . Drug use: No  . Sexual activity: Not on file  Other Topics Concern  . Not on file  Social History Narrative   Pt states her son and daughter in law live with her   Social Determinants of Health   Financial Resource Strain: Low Risk   . Difficulty of Paying Living Expenses: Not hard at all  Food Insecurity: No Food Insecurity  . Worried About Charity fundraiser in the Last Year: Never true  . Ran Out of Food in the Last Year: Never true  Transportation Needs: No Transportation Needs  . Lack of Transportation (Medical): No  . Lack of Transportation (Non-Medical): No  Physical Activity: Inactive  . Days of Exercise per Week: 0 days  . Minutes of Exercise per Session: 0 min  Stress: No Stress Concern Present  . Feeling of Stress : Not at all  Social Connections:  Socially Isolated  . Frequency of Communication with Friends and Family: More than three times a week  . Frequency of Social Gatherings with Friends and Family: Never  . Attends Religious Services: Never  . Active Member of Clubs or Organizations: No  . Attends Archivist Meetings: Never  . Marital Status: Widowed  Intimate Partner Violence: Not At Risk  . Fear of Current or Ex-Partner: No  . Emotionally Abused: No  . Physically Abused: No  . Sexually Abused: No   Review of Systems  Walks with walker Homebound Eating okay No N/V Slight diarrhea---relates to her blueberries     Objective:   Physical Exam Constitutional:      Appearance: Normal appearance.  Pulmonary:      Effort: Pulmonary effort is normal. No respiratory distress.  Neurological:     Mental Status: She is alert.            Assessment & Plan:

## 2020-12-03 NOTE — Assessment & Plan Note (Addendum)
Exposed in home to son--positive test 8 days ago Minimal symptoms Discussed tylenol prn for any symptoms Maintain quarantine till Monday--then should be able to come out of room then  Had vaccine but not the booster---asked her to get this when she feels better Is getting another antigen test tomorrow--okay to stop quarantine if this is negartive

## 2020-12-06 NOTE — Telephone Encounter (Signed)
Patient wanted to let Dr. Martinique know that she was tested for COVID on 01/23 and got the results on 01/24  Her results were negative  FYI

## 2020-12-27 ENCOUNTER — Other Ambulatory Visit: Payer: Self-pay

## 2020-12-28 ENCOUNTER — Ambulatory Visit (INDEPENDENT_AMBULATORY_CARE_PROVIDER_SITE_OTHER): Payer: Medicare HMO | Admitting: Family Medicine

## 2020-12-28 ENCOUNTER — Encounter: Payer: Self-pay | Admitting: Family Medicine

## 2020-12-28 VITALS — BP 124/80 | HR 88 | Resp 16 | Ht 63.0 in | Wt 162.1 lb

## 2020-12-28 DIAGNOSIS — R6889 Other general symptoms and signs: Secondary | ICD-10-CM | POA: Diagnosis not present

## 2020-12-28 DIAGNOSIS — E559 Vitamin D deficiency, unspecified: Secondary | ICD-10-CM

## 2020-12-28 DIAGNOSIS — E210 Primary hyperparathyroidism: Secondary | ICD-10-CM | POA: Diagnosis not present

## 2020-12-28 DIAGNOSIS — I10 Essential (primary) hypertension: Secondary | ICD-10-CM

## 2020-12-28 DIAGNOSIS — K219 Gastro-esophageal reflux disease without esophagitis: Secondary | ICD-10-CM | POA: Diagnosis not present

## 2020-12-28 DIAGNOSIS — R059 Cough, unspecified: Secondary | ICD-10-CM | POA: Diagnosis not present

## 2020-12-28 DIAGNOSIS — F419 Anxiety disorder, unspecified: Secondary | ICD-10-CM

## 2020-12-28 DIAGNOSIS — E6609 Other obesity due to excess calories: Secondary | ICD-10-CM

## 2020-12-28 DIAGNOSIS — Z6832 Body mass index (BMI) 32.0-32.9, adult: Secondary | ICD-10-CM

## 2020-12-28 DIAGNOSIS — I499 Cardiac arrhythmia, unspecified: Secondary | ICD-10-CM

## 2020-12-28 DIAGNOSIS — R0689 Other abnormalities of breathing: Secondary | ICD-10-CM

## 2020-12-28 DIAGNOSIS — I4891 Unspecified atrial fibrillation: Secondary | ICD-10-CM | POA: Diagnosis not present

## 2020-12-28 DIAGNOSIS — I4819 Other persistent atrial fibrillation: Secondary | ICD-10-CM | POA: Insufficient documentation

## 2020-12-28 DIAGNOSIS — I700 Atherosclerosis of aorta: Secondary | ICD-10-CM

## 2020-12-28 LAB — CBC
HCT: 48.2 % — ABNORMAL HIGH (ref 36.0–46.0)
Hemoglobin: 16.1 g/dL — ABNORMAL HIGH (ref 12.0–15.0)
MCHC: 33.5 g/dL (ref 30.0–36.0)
MCV: 89.8 fl (ref 78.0–100.0)
Platelets: 203 10*3/uL (ref 150.0–400.0)
RBC: 5.36 Mil/uL — ABNORMAL HIGH (ref 3.87–5.11)
RDW: 14.7 % (ref 11.5–15.5)
WBC: 5.8 10*3/uL (ref 4.0–10.5)

## 2020-12-28 LAB — COMPREHENSIVE METABOLIC PANEL
ALT: 15 U/L (ref 0–35)
AST: 17 U/L (ref 0–37)
Albumin: 4.4 g/dL (ref 3.5–5.2)
Alkaline Phosphatase: 94 U/L (ref 39–117)
BUN: 14 mg/dL (ref 6–23)
CO2: 27 mEq/L (ref 19–32)
Calcium: 12.6 mg/dL — ABNORMAL HIGH (ref 8.4–10.5)
Chloride: 100 mEq/L (ref 96–112)
Creatinine, Ser: 0.94 mg/dL (ref 0.40–1.20)
GFR: 54.3 mL/min — ABNORMAL LOW (ref >60.00)
Glucose, Bld: 93 mg/dL (ref 70–99)
Potassium: 4.8 mEq/L (ref 3.5–5.1)
Sodium: 139 mEq/L (ref 135–145)
Total Bilirubin: 1.2 mg/dL (ref 0.2–1.2)
Total Protein: 7 g/dL (ref 6.0–8.3)

## 2020-12-28 LAB — VITAMIN D 25 HYDROXY (VIT D DEFICIENCY, FRACTURES): VITD: 62.59 ng/mL (ref 30.00–100.00)

## 2020-12-28 LAB — TSH: TSH: 1.3 u[IU]/mL (ref 0.35–4.50)

## 2020-12-28 NOTE — Assessment & Plan Note (Signed)
BP adequately controlled. Continue benazepril 40 mg daily and amlodipine 5 mg daily. Continue low-salt diet. Eye exam is current.

## 2020-12-28 NOTE — Assessment & Plan Note (Addendum)
Problem is is stable. Continue lorazepam 0.5 mg 1/2 to 1 tablet daily as needed. She does not take Lorazepam frequently,still has some left from old Rx. It has been well-tolerated and she understand possible side effects.

## 2020-12-28 NOTE — Assessment & Plan Note (Signed)
Continue vitamin D 2000 units daily. Further recommendation will be given according to 25 [place] vitamin D result. 

## 2020-12-28 NOTE — Progress Notes (Signed)
HPI: Annette [name] is a 87 y.o. female, who is here today for 6 months follow up.   She was last seen on 08/31/20. No new problems since her last visit. HTN: She is on Amlodipine 5 mg daily and Benazepril 40 mg daily. She takes Furosemide 40 mg daily for LE edema.  Noted irregular HR on examination. She has not noted palpitations. Echo 11/13/19: LVEF 60-65%.  Aortic atherosclerosis has been seeing on imaging, chest CTA in 10/2019. She is not on statin medication.  Lab Results  Component Value Date   CHOL 239 (H) 05/23/2016   HDL 64.50 05/23/2016   LDLCALC 149 (H) 05/23/2016   LDLDIRECT 121.2 06/17/2012   TRIG 125.0 05/23/2016   CHOLHDL 4 05/23/2016   Negative severe/frequent headache, visual changes, chest pain, dyspnea, claudication, focal weakness, or worsening edema. She is active around her house, small chores. She takes the trash out, cooking,and washing her clothes.  Lab Results  Component Value Date   CREATININE 0.99 (H) 07/01/2020   BUN 15 07/01/2020   NA 137 07/01/2020   K 4.9 07/01/2020   CL 101 07/01/2020   CO2 27 07/01/2020    Lab Results  Component Value Date   WBC 6.0 01/05/2020   HGB 15.3 (H) 01/05/2020   HCT 46.1 (H) 01/05/2020   MCV 88.3 01/05/2020   PLT 210.0 01/05/2020   She is using her walker, no falls since her last visit.  Hyperparathyroidism: She has not noted muscle cramps,abdominal pain,changes in bowel habits,or MS changes. Vit D deficiency:She is on Vit D 2000 U daily.  Lab Results  Component Value Date   PTH 186 (H) 07/01/2020   CALCIUM 11.9 (H) 07/01/2020   She is very glad she has lost wt. She is "really trying" to eat healthy. She is walking for a few minutes out doors.  She wants to know if she can take her COVID 19 booster. She was dx'ed with COVID 19 infection 11/30/20, all symptoms resolved. Last COVID 19 vaccine 02/03/20.  Cough at night when in bed and post nasal drainage.  Also cough in the morning,  attributed to GERD. Negative for associated wheezing or SOB. She is on Omeprazole 20 mg daily. She has not noted heartburn or abdominal pain.  She has taken Lorazepam once, she has 14 tab left. Relation with her daughter in law has improved greatly, she has been very kind to her since she was sick with COVID 19 infection. This has helped with anxiety/stress.  Review of Systems  Constitutional: Negative for activity change, appetite change, fatigue and fever.  HENT: Negative for mouth sores, nosebleeds and sore throat.   Gastrointestinal: Negative for nausea and vomiting.  Genitourinary: Negative for decreased urine volume, dysuria and hematuria.  Musculoskeletal: Positive for arthralgias and gait problem.  Allergic/Immunologic: Positive for environmental allergies.  Neurological: Negative for syncope, facial asymmetry and weakness.  Psychiatric/Behavioral: Negative for confusion. The patient is nervous/anxious.   Rest of ROS, see pertinent positives sand negatives in HPI  Current Outpatient Medications on File Prior to Visit  Medication Sig Dispense Refill  . acetaminophen (TYLENOL) 500 MG tablet Take 500 mg by mouth every 6 (six) hours as needed (arthritis pain).    Marland Kitchen amLODipine (NORVASC) 5 MG tablet Take 1 tablet (5 mg total) by mouth daily. 90 tablet 1  . benazepril (LOTENSIN) 40 MG tablet TAKE 1 TABLET EVERY DAY 90 tablet 3  . furosemide (LASIX) 40 MG tablet TAKE 1 TABLET THREE TIMES WEEKLY  39 tablet 3  . latanoprost (XALATAN) 0.005 % ophthalmic solution INSTILL 1 DROP INTO BOTH EYES AT BEDTIME (Patient taking differently: Place 1 drop into both eyes at bedtime.) 7.5 mL 3  . LORazepam (ATIVAN) 0.5 MG tablet Take 0.25-0.5 mg by mouth at bedtime.    Marland Kitchen omeprazole (PRILOSEC) 20 MG capsule TAKE 1 CAPSULE EVERY DAY 90 capsule 3  . vitamin B-12 (CYANOCOBALAMIN) 1000 MCG tablet Take 1,000 mcg by mouth 2 (two) times a week.    Marland Kitchen VITAMIN D PO Take 2,000 Units by mouth daily.     . Zinc 30 MG  TABS Take by mouth.     No current facility-administered medications on file prior to visit.   Past Medical History:  Diagnosis Date  . GERD 10/03/2007  . HYPERTENSION 10/03/2007  . OSTEOPENIA 02/03/2008  . Primary hyperparathyroidism ([place]) 10/03/2007   Allergies  Allergen Reactions  . Codeine Nausea And Vomiting    Social History   Socioeconomic History  . Marital status: Widowed    Spouse name: Not on file  . Number of children: Not on file  . Years of education: Not on file  . Highest education level: Not on file  Occupational History  . Occupation: retired  Tobacco Use  . Smoking status: Former Smoker    Quit date: 11/12/1978    Years since quitting: 42.1  . Smokeless tobacco: Never Used  Substance and Sexual Activity  . Alcohol use: No  . Drug use: No  . Sexual activity: Not on file  Other Topics Concern  . Not on file  Social History Narrative   Pt states her son and daughter in law live with her   Social Determinants of Health   Financial Resource Strain: Low Risk   . Difficulty of Paying Living Expenses: Not hard at all  Food Insecurity: No Food Insecurity  . Worried About Charity fundraiser in the Last Year: Never true  . Ran Out of Food in the Last Year: Never true  Transportation Needs: No Transportation Needs  . Lack of Transportation (Medical): No  . Lack of Transportation (Non-Medical): No  Physical Activity: Inactive  . Days of Exercise per Week: 0 days  . Minutes of Exercise per Session: 0 min  Stress: No Stress Concern Present  . Feeling of Stress : Not at all  Social Connections: Socially Isolated  . Frequency of Communication with Friends and Family: More than three times a week  . Frequency of Social Gatherings with Friends and Family: Never  . Attends Religious Services: Never  . Active Member of Clubs or Organizations: No  . Attends Archivist Meetings: Never  . Marital Status: Widowed   Vitals:   12/28/20 1044  BP: 124/80   Pulse: 88  Resp: 16  SpO2: 90%   Wt Readings from Last 3 Encounters:  12/28/20 162 lb 2 oz (73.5 kg)  08/31/20 171 lb (77.6 kg)  07/01/20 174 lb (78.9 kg)   Body mass index is 28.72 kg/m.  Physical Exam Vitals and nursing note reviewed.  Constitutional:      General: She is not in acute distress.    Appearance: She is well-developed.  HENT:     Head: Normocephalic and atraumatic.     Mouth/Throat:     Mouth: Oropharynx is clear and moist and mucous membranes are normal. Mucous membranes are moist.     Pharynx: Oropharynx is clear.  Eyes:     Conjunctiva/sclera: Conjunctivae normal.  Pupils:     Right eye: Pupil is not round.     Left eye: Pupil is round.  Cardiovascular:     Rate and Rhythm: Normal rate. Rhythm irregular.     Pulses:          Dorsalis pedis pulses are 2+ on the right side and 2+ on the left side.     Heart sounds: No murmur heard.     Comments: Pitting LE/peri ankle edema,bilateral. Varicose veins and a few ecchymotic areas on LE,bilateral. Pulmonary:     Effort: Pulmonary effort is normal. No respiratory distress.     Breath sounds: Decreased air movement present. Examination of the right-middle field reveals decreased breath sounds. Examination of the right-lower field reveals decreased breath sounds. Decreased breath sounds present. No wheezing, rhonchi or rales.  Abdominal:     Palpations: Abdomen is soft. There is no hepatomegaly.     Tenderness: There is no abdominal tenderness.  Musculoskeletal:     Right lower leg: 1+ Edema present.     Left lower leg: 1+ Edema present.  Lymphadenopathy:     Cervical: No cervical adenopathy.  Skin:    General: Skin is warm.     Findings: No erythema or rash.  Neurological:     Mental Status: She is alert and oriented to person, place, and time.     Cranial Nerves: No cranial nerve deficit.     Deep Tendon Reflexes: Strength normal.     Comments: Unstable gait assisted with a walker.  Psychiatric:         Mood and Affect: Mood is anxious.     Comments: Well groomed, good eye contact.   ASSESSMENT AND PLAN:  Ms. CAYLEIGH PAULL was seen today for 6 months follow-up.  Orders Placed This Encounter  Procedures  . DG Chest 2 View  . CBC  . Comprehensive metabolic panel  . VITAMIN D 25 Hydroxy (Vit-D Deficiency, Fractures)  . Parathyroid hormone, intact (no Ca)  . Calcium / creatinine ratio, urine  . TSH  . EKG 12-Lead   Lab Results  Component Value Date   WBC 5.8 12/28/2020   HGB 16.1 (H) 12/28/2020   HCT 48.2 (H) 12/28/2020   MCV 89.8 12/28/2020   PLT 203.0 12/28/2020   Lab Results  Component Value Date   ALT 15 12/28/2020   AST 17 12/28/2020   ALKPHOS 94 12/28/2020   BILITOT 1.2 12/28/2020    Lab Results  Component Value Date   CREATININE 0.94 12/28/2020   BUN 14 12/28/2020   NA 139 12/28/2020   K 4.8 12/28/2020   CL 100 12/28/2020   CO2 27 12/28/2020   Lab Results  Component Value Date   TSH 1.30 12/28/2020   Cough Mild hypoventilation upon right side auscultation. We discussed possible etiologies of cough. Benazepril can be a contributing factor. Further recommendations according to CXR results.  Vitamin D deficiency, unspecified Continue vitamin D 2000 units daily. Further recommendation will be given according to 25 OH vitamin D result.  Anxiety disorder, unspecified Problem is is stable. Continue lorazepam 0.5 mg 1/2 to 1 tablet daily as needed. She does not take Lorazepam frequently,still has some left from old Rx. It has been well-tolerated and she understand possible side effects.   Atrial fibrillation ([place]) EKG today showed atrial fib,LAD. Compared with EKG done 11/16/19 no significant changes.  CHA2DS2-VASc score 4. We discussed diagnosis, prognosis, and treatment options. She declines oral anticoagulation as well as cardiology evaluation. We  discussed possible complications as well as side effects of anticoagulation, she voices  understanding. Recommend Aspirin 81 mg daily. Instructed about warning signs.  Essential hypertension BP adequately controlled. Continue benazepril 40 mg daily and amlodipine 5 mg daily. Continue low-salt diet. Eye exam is current.  GERD This problem can certainly be contributing to persistent cough. For now no changes in Omeprazole dose. GERD precautions to continue.  Primary hyperparathyroidism ([place]) Asymptomatic. Ca++ and PTH have been stable. She would like to have labs done today. We will continue following and further recommendations will be given according to lab results.  Atherosclerosis of aorta ([place]) She is not on statin medication. Aspirin started today.  Class 1 obesity with body mass index (BMI) of 32.0 to 32.9 in adult She lost about 9 Lb since her last visit, she has been trying to lose wt. Encouraged to continue a healthful diet and low impact regular physical activity.  Spent 53 minutes.  During this time history was obtained and documented, examination was performed, prior labs/imaging reviewed, and assessment/plan discussed.  Return in about 5 months (around 05/27/2021).    [name]. Martinique, MD  [facility]. [place] office.    A few things to remember from today's visit:   Vitamin D deficiency, unspecified - Plan: VITAMIN D 25 Hydroxy (Vit-D Deficiency, Fractures)  Essential hypertension - Plan: CBC, Comprehensive metabolic panel  Anxiety disorder, unspecified type  Primary hyperparathyroidism ([place]) - Plan: VITAMIN D 25 Hydroxy (Vit-D Deficiency, Fractures), Parathyroid hormone, intact (no Ca), Calcium / creatinine ratio, urine  Gastroesophageal reflux disease without esophagitis  Irregular heart rhythm - Plan: TSH, EKG 12-Lead  Hypoventilation - Plan: DG Chest 2 View  Atrial fibrillation, unspecified type ([place])  If you need refills please call your pharmacy. Do not use My Chart to request refills or for acute issues that need  immediate attention.   Let's start Aspirin 81 mg daily.  Atrial Fibrillation  Atrial fibrillation is a type of heartbeat that is irregular or fast. If you have this condition, your heart beats without any order. This makes it hard for your heart to pump blood in a normal way. Atrial fibrillation may come and go, or it may become a long-lasting problem. If this condition is not treated, it can put you at higher risk for stroke, heart failure, and other heart problems. What are the causes? This condition may be caused by diseases that damage the heart. They include:  High blood pressure.  Heart failure.  Heart valve disease.  Heart surgery. Other causes include:  Diabetes.  Thyroid disease.  Being overweight.  Kidney disease. Sometimes the cause is not known. What increases the risk? You are more likely to develop this condition if:  You are older.  You smoke.  You exercise often and very hard.  You have a family history of this condition.  You are a man.  You use drugs.  You drink a lot of alcohol.  You have lung conditions, such as emphysema, pneumonia, or COPD.  You have sleep apnea. What are the signs or symptoms? Common symptoms of this condition include:  A feeling that your heart is beating very fast.  Chest pain or discomfort.  Feeling short of breath.  Suddenly feeling light-headed or weak.  Getting tired easily during activity.  Fainting.  Sweating. In some cases, there are no symptoms. How is this treated? Treatment for this condition depends on underlying conditions and how you feel when you have atrial fibrillation. They include:  Medicines to: ? Prevent blood clots. ? Treat heart rate or heart rhythm problems.  Using devices, such as a pacemaker, to correct heart rhythm problems.  Doing surgery to remove the part of the heart that sends bad signals.  Closing an area where clots can form in the heart (left atrial appendage). In  some cases, your doctor will treat other underlying conditions. Follow these instructions at home: Medicines  Take over-the-counter and prescription medicines only as told by your doctor.  Do not take any new medicines without first talking to your doctor.  If you are taking blood thinners: ? Talk with your doctor before you take any medicines that have aspirin or NSAIDs, such as ibuprofen, in them. ? Take your medicine exactly as told by your doctor. Take it at the same time each day. ? Avoid activities that could hurt or bruise you. Follow instructions about how to prevent falls. ? Wear a bracelet that says you are taking blood thinners. Or, carry a card that lists what medicines you take. Lifestyle  Do not use any products that have nicotine or tobacco in them. These include cigarettes, e-cigarettes, and chewing tobacco. If you need help quitting, ask your doctor.  Eat heart-healthy foods. Talk with your doctor about the right eating plan for you.  Exercise regularly as told by your doctor.  Do not drink alcohol.  Lose weight if you are overweight.  Do not use drugs, including cannabis.      General instructions  If you have a condition that causes breathing to stop for a short period of time (apnea), treat it as told by your doctor.  Keep a healthy weight. Do not use diet pills unless your doctor says they are safe for you. Diet pills may make heart problems worse.  Keep all follow-up visits as told by your doctor. This is important. Contact a doctor if:  You notice a change in the speed, rhythm, or strength of your heartbeat.  You are taking a blood-thinning medicine and you get more bruising.  You get tired more easily when you move or exercise.  You have a sudden change in weight. Get help right away if:  You have pain in your chest or your belly (abdomen).  You have trouble breathing.  You have side effects of blood thinners, such as blood in your vomit, poop  (stool), or pee (urine), or bleeding that cannot stop.  You have any signs of a stroke. "BE FAST" is an easy way to remember the main warning signs: ? B - Balance. Signs are dizziness, sudden trouble walking, or loss of balance. ? E - Eyes. Signs are trouble seeing or a change in how you see. ? F - Face. Signs are sudden weakness or loss of feeling in the face, or the face or eyelid drooping on one side. ? A - Arms. Signs are weakness or loss of feeling in an arm. This happens suddenly and usually on one side of the body. ? S - Speech. Signs are sudden trouble speaking, slurred speech, or trouble understanding what people say. ? T - Time. Time to call emergency services. Write down what time symptoms started.  You have other signs of a stroke, such as: ? A sudden, very bad headache with no known cause. ? Feeling like you may vomit (nausea). ? Vomiting. ? A seizure. These symptoms may be an emergency. Do not wait to see if the symptoms will go away. Get medical help right away. Call  your local emergency services (911 in the [place] Do not drive yourself to the hospital.   Summary  Atrial fibrillation is a type of heartbeat that is irregular or fast.  You are at higher risk of this condition if you smoke, are older, have diabetes, or are overweight.  Follow your doctor's instructions about medicines, diet, exercise, and follow-up visits.  Get help right away if you have signs or symptoms of a stroke.  Get help right away if you cannot catch your breath, or you have chest pain or discomfort. This information is not intended to replace advice given to you by your health care provider. Make sure you discuss any questions you have with your health care provider. Document Revised: 04/22/2019 Document Reviewed: 04/22/2019 Elsevier Patient Education  [place].   Please be sure medication list is accurate. If a new problem present, please set up appointment sooner than planned  today.

## 2020-12-28 NOTE — Assessment & Plan Note (Addendum)
EKG today showed atrial fib,LAD. Compared with EKG done 11/16/19 no significant changes.  CHA2DS2-VASc score 4. We discussed diagnosis, prognosis, and treatment options. She declines oral anticoagulation as well as cardiology evaluation. We discussed possible complications as well as side effects of anticoagulation, she voices understanding. Recommend Aspirin 81 mg daily. Instructed about warning signs.

## 2020-12-28 NOTE — Assessment & Plan Note (Signed)
She is not on statin medication. Aspirin started today.

## 2020-12-28 NOTE — Assessment & Plan Note (Signed)
She lost about 9 Lb since her last visit, she has been trying to lose wt. Encouraged to continue a healthful diet and low impact regular physical activity.

## 2020-12-28 NOTE — Patient Instructions (Signed)
A few things to remember from today's visit:   Vitamin D deficiency, unspecified - Plan: VITAMIN D 25 Hydroxy (Vit-D Deficiency, Fractures)  Essential hypertension - Plan: CBC, Comprehensive metabolic panel  Anxiety disorder, unspecified type  Primary hyperparathyroidism ([place]) - Plan: VITAMIN D 25 Hydroxy (Vit-D Deficiency, Fractures), Parathyroid hormone, intact (no Ca), Calcium / creatinine ratio, urine  Gastroesophageal reflux disease without esophagitis  Irregular heart rhythm - Plan: TSH, EKG 12-Lead  Hypoventilation - Plan: DG Chest 2 View  Atrial fibrillation, unspecified type ([place])  If you need refills please call your pharmacy. Do not use My Chart to request refills or for acute issues that need immediate attention.   Let's start Aspirin 81 mg daily.  Atrial Fibrillation  Atrial fibrillation is a type of heartbeat that is irregular or fast. If you have this condition, your heart beats without any order. This makes it hard for your heart to pump blood in a normal way. Atrial fibrillation may come and go, or it may become a long-lasting problem. If this condition is not treated, it can put you at higher risk for stroke, heart failure, and other heart problems. What are the causes? This condition may be caused by diseases that damage the heart. They include:  High blood pressure.  Heart failure.  Heart valve disease.  Heart surgery. Other causes include:  Diabetes.  Thyroid disease.  Being overweight.  Kidney disease. Sometimes the cause is not known. What increases the risk? You are more likely to develop this condition if:  You are older.  You smoke.  You exercise often and very hard.  You have a family history of this condition.  You are a man.  You use drugs.  You drink a lot of alcohol.  You have lung conditions, such as emphysema, pneumonia, or COPD.  You have sleep apnea. What are the signs or symptoms? Common symptoms of this  condition include:  A feeling that your heart is beating very fast.  Chest pain or discomfort.  Feeling short of breath.  Suddenly feeling light-headed or weak.  Getting tired easily during activity.  Fainting.  Sweating. In some cases, there are no symptoms. How is this treated? Treatment for this condition depends on underlying conditions and how you feel when you have atrial fibrillation. They include:  Medicines to: ? Prevent blood clots. ? Treat heart rate or heart rhythm problems.  Using devices, such as a pacemaker, to correct heart rhythm problems.  Doing surgery to remove the part of the heart that sends bad signals.  Closing an area where clots can form in the heart (left atrial appendage). In some cases, your doctor will treat other underlying conditions. Follow these instructions at home: Medicines  Take over-the-counter and prescription medicines only as told by your doctor.  Do not take any new medicines without first talking to your doctor.  If you are taking blood thinners: ? Talk with your doctor before you take any medicines that have aspirin or NSAIDs, such as ibuprofen, in them. ? Take your medicine exactly as told by your doctor. Take it at the same time each day. ? Avoid activities that could hurt or bruise you. Follow instructions about how to prevent falls. ? Wear a bracelet that says you are taking blood thinners. Or, carry a card that lists what medicines you take. Lifestyle  Do not use any products that have nicotine or tobacco in them. These include cigarettes, e-cigarettes, and chewing tobacco. If you need help quitting, ask  your doctor.  Eat heart-healthy foods. Talk with your doctor about the right eating plan for you.  Exercise regularly as told by your doctor.  Do not drink alcohol.  Lose weight if you are overweight.  Do not use drugs, including cannabis.      General instructions  If you have a condition that causes  breathing to stop for a short period of time (apnea), treat it as told by your doctor.  Keep a healthy weight. Do not use diet pills unless your doctor says they are safe for you. Diet pills may make heart problems worse.  Keep all follow-up visits as told by your doctor. This is important. Contact a doctor if:  You notice a change in the speed, rhythm, or strength of your heartbeat.  You are taking a blood-thinning medicine and you get more bruising.  You get tired more easily when you move or exercise.  You have a sudden change in weight. Get help right away if:  You have pain in your chest or your belly (abdomen).  You have trouble breathing.  You have side effects of blood thinners, such as blood in your vomit, poop (stool), or pee (urine), or bleeding that cannot stop.  You have any signs of a stroke. "BE FAST" is an easy way to remember the main warning signs: ? B - Balance. Signs are dizziness, sudden trouble walking, or loss of balance. ? E - Eyes. Signs are trouble seeing or a change in how you see. ? F - Face. Signs are sudden weakness or loss of feeling in the face, or the face or eyelid drooping on one side. ? A - Arms. Signs are weakness or loss of feeling in an arm. This happens suddenly and usually on one side of the body. ? S - Speech. Signs are sudden trouble speaking, slurred speech, or trouble understanding what people say. ? T - Time. Time to call emergency services. Write down what time symptoms started.  You have other signs of a stroke, such as: ? A sudden, very bad headache with no known cause. ? Feeling like you may vomit (nausea). ? Vomiting. ? A seizure. These symptoms may be an emergency. Do not wait to see if the symptoms will go away. Get medical help right away. Call your local emergency services (911 in the [place] Do not drive yourself to the hospital.   Summary  Atrial fibrillation is a type of heartbeat that is irregular or fast.  You are at  higher risk of this condition if you smoke, are older, have diabetes, or are overweight.  Follow your doctor's instructions about medicines, diet, exercise, and follow-up visits.  Get help right away if you have signs or symptoms of a stroke.  Get help right away if you cannot catch your breath, or you have chest pain or discomfort. This information is not intended to replace advice given to you by your health care provider. Make sure you discuss any questions you have with your health care provider. Document Revised: 04/22/2019 Document Reviewed: 04/22/2019 Elsevier Patient Education  [place].   Please be sure medication list is accurate. If a new problem present, please set up appointment sooner than planned today.

## 2020-12-28 NOTE — Assessment & Plan Note (Addendum)
Asymptomatic. Ca++ and PTH have been stable. She would like to have labs done today. We will continue following and further recommendations will be given according to lab results.

## 2020-12-28 NOTE — Assessment & Plan Note (Signed)
This problem can certainly be contributing to persistent cough. For now no changes in Omeprazole dose. GERD precautions to continue.

## 2020-12-29 LAB — CALCIUM / CREATININE RATIO, URINE
CALCIUM, RANDOM URINE: 4.9 mg/dL
CALCIUM/CREATININE RATIO: 23 mg/g creat (ref 10–320)
Creatinine, Urine: 210 mg/dL (ref 20–275)

## 2020-12-29 LAB — PARATHYROID HORMONE, INTACT (NO CA): PTH: 301 pg/mL — ABNORMAL HIGH (ref 14–64)

## 2020-12-30 ENCOUNTER — Encounter: Payer: Self-pay | Admitting: Family Medicine

## 2020-12-30 MED ORDER — LOSARTAN POTASSIUM 50 MG PO TABS: 50.0000 mg | ORAL_TABLET | Freq: Every day | ORAL | 3 refills | Status: DC

## 2021-01-04 ENCOUNTER — Encounter: Payer: Self-pay | Admitting: Family Medicine

## 2021-01-09 ENCOUNTER — Encounter: Payer: Self-pay | Admitting: Family Medicine

## 2021-01-10 ENCOUNTER — Encounter: Payer: Self-pay | Admitting: Family Medicine

## 2021-01-13 ENCOUNTER — Other Ambulatory Visit: Payer: Self-pay | Admitting: Family Medicine

## 2021-01-13 DIAGNOSIS — I4891 Unspecified atrial fibrillation: Secondary | ICD-10-CM

## 2021-01-21 ENCOUNTER — Other Ambulatory Visit: Payer: Self-pay | Admitting: Family Medicine

## 2021-01-21 ENCOUNTER — Encounter: Payer: Self-pay | Admitting: Family Medicine

## 2021-02-05 ENCOUNTER — Encounter: Payer: Self-pay | Admitting: Family Medicine

## 2021-02-15 ENCOUNTER — Other Ambulatory Visit: Payer: Self-pay

## 2021-02-15 ENCOUNTER — Ambulatory Visit: Payer: Medicare HMO | Admitting: Endocrinology

## 2021-02-15 ENCOUNTER — Encounter: Payer: Self-pay | Admitting: Endocrinology

## 2021-02-15 DIAGNOSIS — R6889 Other general symptoms and signs: Secondary | ICD-10-CM | POA: Diagnosis not present

## 2021-02-15 MED ORDER — CINACALCET HCL 30 MG PO TABS: 30.0000 mg | ORAL_TABLET | Freq: Every day | ORAL | 3 refills | Status: DC

## 2021-02-15 NOTE — Progress Notes (Signed)
Subjective:    Patient ID: Annette [name], female    DOB: Oct 08, 1930, 87 y.o.   MRN: [id number]  HPI Pt is referred by Dr [place]ique, for hypercalcemia.  Pt was noted to have hypercalcemia in 2008.  she has never had osteoporosis, urolithiasis, thyroid probs, sarcoidosis, PUD, or pancreatitis.  only bony fracture was right wrist (1988, with an injury).  He does not take vitamin-A supplements.  Pt denies taking antacids, Li++, or HCTZ.  She takes Vit-D, 1000 units/day.  pt states she feels well in general.   Past Medical History:  Diagnosis Date  . GERD 10/03/2007  . HYPERTENSION 10/03/2007  . OSTEOPENIA 02/03/2008  . Primary hyperparathyroidism ([place]) 10/03/2007    Past Surgical History:  Procedure Laterality Date  . ABDOMINAL HYSTERECTOMY      Social History   Socioeconomic History  . Marital status: Widowed    Spouse name: Not on file  . Number of children: Not on file  . Years of education: Not on file  . Highest education level: Not on file  Occupational History  . Occupation: retired  Tobacco Use  . Smoking status: Former Smoker    Quit date: 11/12/1978    Years since quitting: 42.2  . Smokeless tobacco: Never Used  Substance and Sexual Activity  . Alcohol use: No  . Drug use: No  . Sexual activity: Not on file  Other Topics Concern  . Not on file  Social History Narrative   Pt states her son and daughter in law live with her   Social Determinants of Health   Financial Resource Strain: Low Risk   . Difficulty of Paying Living Expenses: Not hard at all  Food Insecurity: No Food Insecurity  . Worried About Charity fundraiser in the Last Year: Never true  . Ran Out of Food in the Last Year: Never true  Transportation Needs: No Transportation Needs  . Lack of Transportation (Medical): No  . Lack of Transportation (Non-Medical): No  Physical Activity: Inactive  . Days of Exercise per Week: 0 days  . Minutes of Exercise per Session: 0 min  Stress: No Stress Concern  Present  . Feeling of Stress : Not at all  Social Connections: Socially Isolated  . Frequency of Communication with Friends and Family: More than three times a week  . Frequency of Social Gatherings with Friends and Family: Never  . Attends Religious Services: Never  . Active Member of Clubs or Organizations: No  . Attends Archivist Meetings: Never  . Marital Status: Widowed  Intimate Partner Violence: Not At Risk  . Fear of Current or Ex-Partner: No  . Emotionally Abused: No  . Physically Abused: No  . Sexually Abused: No    Current Outpatient Medications on File Prior to Visit  Medication Sig Dispense Refill  . acetaminophen (TYLENOL) 500 MG tablet Take 500 mg by mouth every 6 (six) hours as needed (arthritis pain).    Marland Kitchen amLODipine (NORVASC) 5 MG tablet Take 1 tablet (5 mg total) by mouth daily. 90 tablet 1  . furosemide (LASIX) 40 MG tablet TAKE 1 TABLET THREE TIMES WEEKLY 39 tablet 3  . latanoprost (XALATAN) 0.005 % ophthalmic solution INSTILL 1 DROP INTO BOTH EYES AT BEDTIME (Patient taking differently: Place 1 drop into both eyes at bedtime.) 7.5 mL 3  . LORazepam (ATIVAN) 0.5 MG tablet Take 0.25-0.5 mg by mouth at bedtime.    Marland Kitchen losartan (COZAAR) 50 MG tablet Take 1 tablet (50 mg  total) by mouth daily. 90 tablet 3  . omeprazole (PRILOSEC) 20 MG capsule TAKE 1 CAPSULE EVERY DAY 90 capsule 3  . vitamin B-12 (CYANOCOBALAMIN) 1000 MCG tablet Take 1,000 mcg by mouth 2 (two) times a week.    Marland Kitchen VITAMIN D PO Take 1,000 Units by mouth daily.    . Zinc 30 MG TABS Take by mouth.     No current facility-administered medications on file prior to visit.    Allergies  Allergen Reactions  . Codeine Nausea And Vomiting    Family History  Problem Relation Age of Onset  . Hypercalcemia Neg Hx     BP 140/86 (BP Location: Right Arm, Patient Position: Sitting, Cuff Size: Normal)   Pulse 98   Ht 5\' 3"  (1.6 m)   Wt 163 lb 6.4 oz (74.1 kg)   SpO2 97%   BMI 28.95 kg/m     Review of Systems Denies weight loss, heartburn, depression, and back pain.      Objective:   Physical Exam VITAL SIGNS:  See vs page GENERAL: no distress NECK: There is no palpable thyroid enlargement.  No thyroid nodule is palpable.  No palpable lymphadenopathy at the anterior neck.  SPINE: no kyphosis GAIT: steady with walker   Lab Results  Component Value Date   CREATININE 0.94 12/28/2020   BUN 14 12/28/2020   NA 139 12/28/2020   K 4.8 12/28/2020   CL 100 12/28/2020   CO2 27 12/28/2020   Lab Results  Component Value Date   PTH 301 (H) 12/28/2020   CALCIUM 12.6 (H) 12/28/2020   Lab Results  Component Value Date   TSH 1.30 12/28/2020   I have reviewed outside records, and summarized: Pt was noted to have elevated Ca++, and referred here.  AF, anxiety, and Vit-D def were also addressed.       Assessment & Plan:  Primary hyperparathyroidism.  We discussed med probs and advanced age.  She is not a surgical candidate.   Patient Instructions  I have sent a prescription to your pharmacy, for a pill to lower the calcium.   Please come back for a follow-up appointment in 6 weeks.

## 2021-02-15 NOTE — Patient Instructions (Addendum)
I have sent a prescription to your pharmacy, for a pill to lower the calcium.   Please come back for a follow-up appointment in 6 weeks.

## 2021-02-16 ENCOUNTER — Encounter: Payer: Self-pay | Admitting: Family Medicine

## 2021-02-28 ENCOUNTER — Encounter: Payer: Self-pay | Admitting: Family Medicine

## 2021-03-01 ENCOUNTER — Other Ambulatory Visit: Payer: Self-pay | Admitting: Family Medicine

## 2021-03-22 NOTE — Progress Notes (Signed)
Cardiology Office Note:    Date:  03/24/2021   ID:  Annette [name], DOB 1930-10-19, MRN [id number]  PCP:  Martinique, [name], MD  Cardiologist:  None  Electrophysiologist:  None   Referring MD: Martinique, [name], MD   Chief Complaint  Patient presents with  . New Patient (Initial Visit)    A fib.  . Atrial Fibrillation    History of Present Illness:    Annette [name] is a 87 y.o. female with a hx of hypertension, PE, primary hyperparathyroidism who is referred by Dr. Martinique for evaluation of atrial fibrillation.  From review of prior EKGs, was in sinus rhythm on EKG 08/18/2018 but has been A. fib since EKG on 11/12/2019.  She was admitted from 11/11/2022 11/16/2019 with submassive PE.  She was started on heparin drip and transition to Eliquis.  Echocardiogram on 11/2019 showed normal biventricular function, no significant valvular disease.  She took Eliquis x 6 months then stopped.  Denies any chest pain, dyspnea, lightheadedness, syncope, lower extremity edema, or palpitations.  Denies any bleeding or falls. Quit smoking in 1980.  No known history of heart disease in her immediate family.  Wt Readings from Last 3 Encounters:  03/24/21 160 lb (72.6 kg)  02/15/21 163 lb 6.4 oz (74.1 kg)  12/28/20 162 lb 2 oz (73.5 kg)     Past Medical History:  Diagnosis Date  . GERD 10/03/2007  . HYPERTENSION 10/03/2007  . OSTEOPENIA 02/03/2008  . Primary hyperparathyroidism ([place]) 10/03/2007    Past Surgical History:  Procedure Laterality Date  . ABDOMINAL HYSTERECTOMY      Current Medications: Current Meds  Medication Sig  . acetaminophen (TYLENOL) 500 MG tablet Take 500 mg by mouth every 6 (six) hours as needed (arthritis pain).  Marland Kitchen amLODipine (NORVASC) 5 MG tablet TAKE 1 TABLET EVERY DAY  . apixaban (ELIQUIS) 5 MG TABS tablet Take 1 tablet (5 mg total) by mouth 2 (two) times daily.  . cinacalcet (SENSIPAR) 30 MG tablet Take 1 tablet (30 mg total) by mouth daily.  . furosemide (LASIX)  40 MG tablet TAKE 1 TABLET THREE TIMES WEEKLY  . latanoprost (XALATAN) 0.005 % ophthalmic solution INSTILL 1 DROP INTO BOTH EYES AT BEDTIME (Patient taking differently: Place 1 drop into both eyes at bedtime.)  . LORazepam (ATIVAN) 0.5 MG tablet Take 0.25-0.5 mg by mouth at bedtime.  Marland Kitchen losartan (COZAAR) 50 MG tablet Take 1 tablet (50 mg total) by mouth daily.  Marland Kitchen omeprazole (PRILOSEC) 20 MG capsule TAKE 1 CAPSULE EVERY DAY  . vitamin B-12 (CYANOCOBALAMIN) 1000 MCG tablet Take 1,000 mcg by mouth 2 (two) times a week.  Marland Kitchen VITAMIN D PO Take 1,000 Units by mouth daily.  . Zinc 30 MG TABS Take by mouth.     Allergies:   Codeine   Social History   Socioeconomic History  . Marital status: Widowed    Spouse name: Not on file  . Number of children: Not on file  . Years of education: Not on file  . Highest education level: Not on file  Occupational History  . Occupation: retired  Tobacco Use  . Smoking status: Former Smoker    Quit date: 11/12/1978    Years since quitting: 42.3  . Smokeless tobacco: Never Used  Substance and Sexual Activity  . Alcohol use: No  . Drug use: No  . Sexual activity: Not on file  Other Topics Concern  . Not on file  Social History Narrative   Pt states  her son and daughter in law live with her   Social Determinants of Health   Financial Resource Strain: Low Risk   . Difficulty of Paying Living Expenses: Not hard at all  Food Insecurity: No Food Insecurity  . Worried About Charity fundraiser in the Last Year: Never true  . Ran Out of Food in the Last Year: Never true  Transportation Needs: No Transportation Needs  . Lack of Transportation (Medical): No  . Lack of Transportation (Non-Medical): No  Physical Activity: Inactive  . Days of Exercise per Week: 0 days  . Minutes of Exercise per Session: 0 min  Stress: No Stress Concern Present  . Feeling of Stress : Not at all  Social Connections: Socially Isolated  . Frequency of Communication with Friends  and Family: More than three times a week  . Frequency of Social Gatherings with Friends and Family: Never  . Attends Religious Services: Never  . Active Member of Clubs or Organizations: No  . Attends Archivist Meetings: Never  . Marital Status: Widowed     Family History: The patient's family history is negative for Hypercalcemia.  ROS:   Please see the history of present illness.     All other systems reviewed and are negative.  EKGs/Labs/Other Studies Reviewed:    The following studies were reviewed today:   EKG:  EKG is  ordered today.  The ekg ordered today demonstrates atrial fibrillation, rate 97, left axis deviation, poor R wave progression  Recent Labs: 12/28/2020: ALT 15; BUN 14; Creatinine, Ser 0.94; Hemoglobin 16.1; Platelets 203.0; Potassium 4.8; Sodium 139; TSH 1.30  Recent Lipid Panel    Component Value Date/Time   CHOL 239 (H) 05/23/2016 0855   TRIG 125.0 05/23/2016 0855   HDL 64.50 05/23/2016 0855   CHOLHDL 4 05/23/2016 0855   VLDL 25.0 05/23/2016 0855   LDLCALC 149 (H) 05/23/2016 0855   LDLDIRECT 121.2 06/17/2012 0931    Physical Exam:    VS:  BP 120/80 (BP Location: Left Arm, Patient Position: Sitting, Cuff Size: Normal)   Pulse 97   Ht 5\' 3"  (1.6 m)   Wt 160 lb (72.6 kg)   BMI 28.34 kg/m     Wt Readings from Last 3 Encounters:  03/24/21 160 lb (72.6 kg)  02/15/21 163 lb 6.4 oz (74.1 kg)  12/28/20 162 lb 2 oz (73.5 kg)     GEN:  in no acute distress HEENT: Normal NECK: No JVD LYMPHATICS: No lymphadenopathy CARDIAC: Irregular, normal rate, no murmurs, rubs, gallops RESPIRATORY:  Clear to auscultation without rales, wheezing or rhonchi  ABDOMEN: Soft, non-tender, non-distended MUSCULOSKELETAL: 1+ edema SKIN: Warm and dry NEUROLOGIC:  Alert and oriented x 3 PSYCHIATRIC:  Normal affect   ASSESSMENT:    1. Atrial fibrillation, unspecified type ([place])   2. Chronic diastolic heart failure ([place])   3. Essential hypertension     PLAN:    Atrial fibrillation: Persistent versus chronic.  From review of prior EKGs, appears to have been in A. fib since her PE in January 2021.  Appears rate controlled, not currently on AV nodal blocker.  Previously has refused anticoagulation.  CHA2DS2-VASc 5 (hypertension, age x2, PE, female) -She is agreeable to starting Eliquis 5 mg twice daily though she does not think she will be able to afford.  We will give samples in office today and apply for financial assistance.  Check CBC -Echocardiogram -Zio patch x3 days to evaluate for rate control  Chronic diastolic heart  failure: On Lasix 40 mg 3 times weekly.  Will check BMP, magnesium  Hypertension: On losartan 50 mg daily and amlodipine 5 mg daily.  Appears controlled  RTC in 6 to 8 weeks  Medication Adjustments/Labs and Tests Ordered: Current medicines are reviewed at length with the patient today.  Concerns regarding medicines are outlined above.  Orders Placed This Encounter  Procedures  . Basic metabolic panel  . CBC  . Magnesium  . LONG TERM MONITOR (3-14 DAYS)  . EKG 12-Lead  . ECHOCARDIOGRAM COMPLETE   Meds ordered this encounter  Medications  . apixaban (ELIQUIS) 5 MG TABS tablet    Sig: Take 1 tablet (5 mg total) by mouth 2 (two) times daily.    Dispense:  60 tablet    Refill:  0    30 day free card    Patient Instructions  Medication Instructions:  START Eliquis 5 mg two times daily  --please complete patient assistance forms and return to office  *If you need a refill on your cardiac medications before your next appointment, please call your pharmacy*   Lab Work: BMET, Mag, CBC today  If you have labs (blood work) drawn today and your tests are completely normal, you will receive your results only by: Marland Kitchen MyChart Message (if you have MyChart) OR . A paper copy in the mail If you have any lab test that is abnormal or we need to change your treatment, we will call you to review the  results.   Testing/Procedures: Your physician has requested that you have an echocardiogram. Echocardiography is a painless test that uses sound waves to create images of your heart. It provides your doctor with information about the size and shape of your heart and how well your heart's chambers and valves are working. This procedure takes approximately one hour. There are no restrictions for this procedure. This will be done at our [place] location:  [place] has requested you wear a ZIO patch monitor for _3_ days.  This is a single patch monitor.   [employer] supplies one patch monitor per enrollment. Additional stickers are not available. Please do not apply patch if you will be having a Nuclear Stress Test, Echocardiogram, Cardiac CT, MRI, or Chest Xray during the period you would be wearing the monitor. The patch cannot be worn during these tests. You cannot remove and re-apply the ZIO XT patch monitor.  Your ZIO patch monitor will be sent Fed Ex from [employer] directly to your home address. It may take 3-5 days to receive your monitor after you have been enrolled.  Once you have received your monitor, please review the enclosed instructions. Your monitor has already been registered assigning a specific monitor serial # to you.  Billing and Patient Assistance Program Information   We have supplied [employer] with any of your insurance information on file for billing purposes. [employer] offers a sliding scale Patient Assistance Program for patients that do not have insurance, or whose insurance does not completely cover the cost of the ZIO monitor.   You must apply for the Patient Assistance Program to qualify for this discounted rate.     To apply, please call [employer] at [phone], select option 4, then select option 2, and ask to apply for Patient Assistance Program.  Theodore Demark will ask your household  income, and how many people are in your household.  They will quote  your out-of-pocket cost based on that information.  [employer] will also be able to set up a 23-month, interest-free payment plan if needed.  Applying the monitor   Shave hair from upper left chest.  Hold abrader disc by orange tab. Rub abrader in 40 strokes over the upper left chest as indicated in your monitor instructions.  Clean area with 4 enclosed alcohol pads. Let dry.  Apply patch as indicated in monitor instructions. Patch will be placed under collarbone on left side of chest with arrow pointing upward.  Rub patch adhesive wings for 2 minutes. Remove white label marked "1". Remove the white label marked "2". Rub patch adhesive wings for 2 additional minutes.  While looking in a mirror, press and release button in center of patch. A small green light will flash 3-4 times. This will be your only indicator that the monitor has been turned on. ?  Do not shower for the first 24 hours. You may shower after the first 24 hours.  Press the button if you feel a symptom. You will hear a small click. Record Date, Time and Symptom in the Patient Logbook.  When you are ready to remove the patch, follow instructions on the last 2 pages of the Patient Logbook. Stick patch monitor onto the last page of Patient Logbook.  Place Patient Logbook in the blue and white box.  Use locking tab on box and tape box closed securely.  The blue and white box has prepaid postage on it. Please place it in the mailbox as soon as possible. Your physician should have your test results approximately 7 days after the monitor has been mailed back to [facility].  Call [place] at [phone] if you have questions regarding your ZIO XT patch monitor. Call them immediately if you see an orange light blinking on your monitor.  If your monitor falls off in less than 4 days, contact our Monitor department at [phone]. ?If your monitor becomes  loose or falls off after 4 days call [employer] at [phone] for suggestions on securing your monitor.?  Follow-Up: At [facility], you and your health needs are our priority.  As part of our continuing mission to provide you with exceptional heart care, we have created designated Provider Care Teams.  These Care Teams include your primary Cardiologist (physician) and Advanced Practice Providers (APPs -  Physician Assistants and Nurse Practitioners) who all work together to provide you with the care you need, when you need it.  We recommend signing up for the patient portal called "MyChart".  Sign up information is provided on this After Visit Summary.  MyChart is used to connect with patients for Virtual Visits (Telemedicine).  Patients are able to view lab/test results, encounter notes, upcoming appointments, etc.  Non-urgent messages can be sent to your provider as well.   To learn more about what you can do with MyChart, go to [email or web address].    Your next appointment:   6-8 week(s)  The format for your next appointment:   In Person  Provider:   Oswaldo Milian, MD        Signed, [name], MD  03/24/2021 10:49 AM    [place]

## 2021-03-24 ENCOUNTER — Ambulatory Visit (INDEPENDENT_AMBULATORY_CARE_PROVIDER_SITE_OTHER): Payer: Medicare HMO

## 2021-03-24 ENCOUNTER — Ambulatory Visit: Payer: Medicare HMO | Admitting: Cardiology

## 2021-03-24 ENCOUNTER — Other Ambulatory Visit: Payer: Self-pay

## 2021-03-24 ENCOUNTER — Encounter: Payer: Self-pay | Admitting: Radiology

## 2021-03-24 ENCOUNTER — Encounter: Payer: Self-pay | Admitting: Cardiology

## 2021-03-24 VITALS — BP 120/80 | HR 97 | Ht 63.0 in | Wt 160.0 lb

## 2021-03-24 DIAGNOSIS — I4891 Unspecified atrial fibrillation: Secondary | ICD-10-CM

## 2021-03-24 DIAGNOSIS — R6889 Other general symptoms and signs: Secondary | ICD-10-CM | POA: Diagnosis not present

## 2021-03-24 DIAGNOSIS — I5032 Chronic diastolic (congestive) heart failure: Secondary | ICD-10-CM | POA: Diagnosis not present

## 2021-03-24 DIAGNOSIS — I10 Essential (primary) hypertension: Secondary | ICD-10-CM

## 2021-03-24 LAB — BASIC METABOLIC PANEL
BUN/Creatinine Ratio: 14 (ref 12–28)
BUN: 13 mg/dL (ref 8–27)
CO2: 22 mmol/L (ref 20–29)
Calcium: 10.5 mg/dL — ABNORMAL HIGH (ref 8.7–10.3)
Chloride: 98 mmol/L (ref 96–106)
Creatinine, Ser: 0.94 mg/dL (ref 0.57–1.00)
Glucose: 90 mg/dL (ref 65–99)
Potassium: 4.7 mmol/L (ref 3.5–5.2)
Sodium: 137 mmol/L (ref 134–144)
eGFR: 58 mL/min/{1.73_m2} — ABNORMAL LOW (ref >59)

## 2021-03-24 LAB — MAGNESIUM: Magnesium: 1.8 mg/dL (ref 1.6–2.3)

## 2021-03-24 LAB — CBC
Hematocrit: 44.9 % (ref 34.0–46.6)
Hemoglobin: 15.1 g/dL (ref 11.1–15.9)
MCH: 30.1 pg (ref 26.6–33.0)
MCHC: 33.6 g/dL (ref 31.5–35.7)
MCV: 90 fL (ref 79–97)
Platelets: 229 10*3/uL (ref 150–450)
RBC: 5.01 x10E6/uL (ref 3.77–5.28)
RDW: 12.3 % (ref 11.7–15.4)
WBC: 8.8 10*3/uL (ref 3.4–10.8)

## 2021-03-24 MED ORDER — ELIQUIS 5 MG PO TABS: 5.0000 mg | ORAL_TABLET | Freq: Two times a day (BID) | ORAL | 0 refills | Status: DC

## 2021-03-24 NOTE — Progress Notes (Signed)
Enrolled patient for a 3 day Zio XT Monitor to be mailed to patients home,

## 2021-03-24 NOTE — Patient Instructions (Signed)
Medication Instructions:  START Eliquis 5 mg two times daily  --please complete patient assistance forms and return to office  *If you need a refill on your cardiac medications before your next appointment, please call your pharmacy*   Lab Work: BMET, Mag, CBC today  If you have labs (blood work) drawn today and your tests are completely normal, you will receive your results only by: Marland Kitchen MyChart Message (if you have MyChart) OR . A paper copy in the mail If you have any lab test that is abnormal or we need to change your treatment, we will call you to review the results.   Testing/Procedures: Your physician has requested that you have an echocardiogram. Echocardiography is a painless test that uses sound waves to create images of your heart. It provides your doctor with information about the size and shape of your heart and how well your heart's chambers and valves are working. This procedure takes approximately one hour. There are no restrictions for this procedure. This will be done at our [facility] location:  [place] has requested you wear a ZIO patch monitor for _3_ days.  This is a single patch monitor.   [employer] supplies one patch monitor per enrollment. Additional stickers are not available. Please do not apply patch if you will be having a Nuclear Stress Test, Echocardiogram, Cardiac CT, MRI, or Chest Xray during the period you would be wearing the monitor. The patch cannot be worn during these tests. You cannot remove and re-apply the ZIO XT patch monitor.  Your ZIO patch monitor will be sent Fed Ex from [employer] directly to your home address. It may take 3-5 days to receive your monitor after you have been enrolled.  Once you have received your monitor, please review the enclosed instructions. Your monitor has already been registered assigning a specific monitor serial # to  you.  Billing and Patient Assistance Program Information   We have supplied [employer] with any of your insurance information on file for billing purposes. [employer] offers a sliding scale Patient Assistance Program for patients that do not have insurance, or whose insurance does not completely cover the cost of the ZIO monitor.   You must apply for the Patient Assistance Program to qualify for this discounted rate.     To apply, please call [employer] at [phone], select option 4, then select option 2, and ask to apply for Patient Assistance Program.  Theodore Demark will ask your household income, and how many people are in your household.  They will quote your out-of-pocket cost based on that information.  [employer] will also be able to set up a 62-month, interest-free payment plan if needed.  Applying the monitor   Shave hair from upper left chest.  Hold abrader disc by orange tab. Rub abrader in 40 strokes over the upper left chest as indicated in your monitor instructions.  Clean area with 4 enclosed alcohol pads. Let dry.  Apply patch as indicated in monitor instructions. Patch will be placed under collarbone on left side of chest with arrow pointing upward.  Rub patch adhesive wings for 2 minutes. Remove white label marked "1". Remove the white label marked "2". Rub patch adhesive wings for 2 additional minutes.  While looking in a mirror, press and release button in center of patch. A small green light will flash 3-4 times. This will be your only indicator that the monitor has  been turned on. ?  Do not shower for the first 24 hours. You may shower after the first 24 hours.  Press the button if you feel a symptom. You will hear a small click. Record Date, Time and Symptom in the Patient Logbook.  When you are ready to remove the patch, follow instructions on the last 2 pages of the Patient Logbook. Stick patch monitor onto the last page of Patient Logbook.  Place Patient Logbook in the blue and white  box.  Use locking tab on box and tape box closed securely.  The blue and white box has prepaid postage on it. Please place it in the mailbox as soon as possible. Your physician should have your test results approximately 7 days after the monitor has been mailed back to [facility].  Call [place] at [phone] if you have questions regarding your ZIO XT patch monitor. Call them immediately if you see an orange light blinking on your monitor.  If your monitor falls off in less than 4 days, contact our Monitor department at [phone]. ?If your monitor becomes loose or falls off after 4 days call [employer] at [phone] for suggestions on securing your monitor.?  Follow-Up: At [facility], you and your health needs are our priority.  As part of our continuing mission to provide you with exceptional heart care, we have created designated Provider Care Teams.  These Care Teams include your primary Cardiologist (physician) and Advanced Practice Providers (APPs -  Physician Assistants and Nurse Practitioners) who all work together to provide you with the care you need, when you need it.  We recommend signing up for the patient portal called "MyChart".  Sign up information is provided on this After Visit Summary.  MyChart is used to connect with patients for Virtual Visits (Telemedicine).  Patients are able to view lab/test results, encounter notes, upcoming appointments, etc.  Non-urgent messages can be sent to your provider as well.   To learn more about what you can do with MyChart, go to [email or web address].    Your next appointment:   6-8 week(s)  The format for your next appointment:   In Person  Provider:   Oswaldo Milian, MD

## 2021-03-28 ENCOUNTER — Encounter: Payer: Self-pay | Admitting: Family Medicine

## 2021-03-28 DIAGNOSIS — I4891 Unspecified atrial fibrillation: Secondary | ICD-10-CM

## 2021-03-29 ENCOUNTER — Telehealth: Payer: Self-pay | Admitting: Cardiology

## 2021-03-29 NOTE — Telephone Encounter (Signed)
Noted. Thanks.

## 2021-03-29 NOTE — Telephone Encounter (Signed)
Pt called back to let our office know that she Misread her medicine bottle and to please disregard the previous message, she does not need a return call.

## 2021-03-29 NOTE — Telephone Encounter (Signed)
Pt c/o medication issue:  1. Name of Medication: apixaban (ELIQUIS) 5 MG TABS tablet  2. How are you currently taking this medication (dosage and times per day)? 2 a day since 03/24/21.   3. Are you having a reaction (difficulty breathing--STAT)? Maybe   4. What is your medication issue? Annette [name] is calling requesting a call from a nurse to discuss Eliquis dosage on how she should take it. Thinks two a day is two strong.

## 2021-03-31 ENCOUNTER — Ambulatory Visit: Payer: Medicare HMO | Admitting: Endocrinology

## 2021-03-31 ENCOUNTER — Other Ambulatory Visit: Payer: Self-pay

## 2021-03-31 DIAGNOSIS — R6889 Other general symptoms and signs: Secondary | ICD-10-CM | POA: Diagnosis not present

## 2021-03-31 NOTE — Patient Instructions (Addendum)
Please continue the same cinacalcet.   You should continue to follow up the blood pressure and heart rate with Dr Gardiner Rhyme.   Please come back for a follow-up appointment in 2 months.

## 2021-03-31 NOTE — Progress Notes (Signed)
Subjective:    Patient ID: Annette [name], female    DOB: 11-Jun-1930, 87 y.o.   MRN: [id number]  HPI Pt returns for f/u of primary hyperparathyroidism (dx'ed 2008; she has never had osteoporosis or urolithiasis; only bony fracture was right wrist (1988, with an injury)). Due to multiple med probs and advanced age; she is not a surgical candidate; she was rx'ed cinacalcet).  Since on cinacalcet, pt states she feels no different. Past Medical History:  Diagnosis Date  . GERD 10/03/2007  . HYPERTENSION 10/03/2007  . OSTEOPENIA 02/03/2008  . Primary hyperparathyroidism ([place]) 10/03/2007    Past Surgical History:  Procedure Laterality Date  . ABDOMINAL HYSTERECTOMY      Social History   Socioeconomic History  . Marital status: Widowed    Spouse name: Not on file  . Number of children: Not on file  . Years of education: Not on file  . Highest education level: Not on file  Occupational History  . Occupation: retired  Tobacco Use  . Smoking status: Former Smoker    Quit date: 11/12/1978    Years since quitting: 42.4  . Smokeless tobacco: Never Used  Substance and Sexual Activity  . Alcohol use: No  . Drug use: No  . Sexual activity: Not on file  Other Topics Concern  . Not on file  Social History Narrative   Pt states her son and daughter in law live with her   Social Determinants of Health   Financial Resource Strain: Low Risk   . Difficulty of Paying Living Expenses: Not hard at all  Food Insecurity: No Food Insecurity  . Worried About Charity fundraiser in the Last Year: Never true  . Ran Out of Food in the Last Year: Never true  Transportation Needs: No Transportation Needs  . Lack of Transportation (Medical): No  . Lack of Transportation (Non-Medical): No  Physical Activity: Inactive  . Days of Exercise per Week: 0 days  . Minutes of Exercise per Session: 0 min  Stress: No Stress Concern Present  . Feeling of Stress : Not at all  Social Connections: Socially  Isolated  . Frequency of Communication with Friends and Family: More than three times a week  . Frequency of Social Gatherings with Friends and Family: Never  . Attends Religious Services: Never  . Active Member of Clubs or Organizations: No  . Attends Archivist Meetings: Never  . Marital Status: Widowed  Intimate Partner Violence: Not At Risk  . Fear of Current or Ex-Partner: No  . Emotionally Abused: No  . Physically Abused: No  . Sexually Abused: No    Current Outpatient Medications on File Prior to Visit  Medication Sig Dispense Refill  . acetaminophen (TYLENOL) 500 MG tablet Take 500 mg by mouth every 6 (six) hours as needed (arthritis pain).    Marland Kitchen amLODipine (NORVASC) 5 MG tablet TAKE 1 TABLET EVERY DAY 90 tablet 3  . apixaban (ELIQUIS) 5 MG TABS tablet Take 1 tablet (5 mg total) by mouth 2 (two) times daily. 60 tablet 0  . cinacalcet (SENSIPAR) 30 MG tablet Take 1 tablet (30 mg total) by mouth daily. 90 tablet 3  . furosemide (LASIX) 40 MG tablet TAKE 1 TABLET THREE TIMES WEEKLY 39 tablet 3  . latanoprost (XALATAN) 0.005 % ophthalmic solution INSTILL 1 DROP INTO BOTH EYES AT BEDTIME (Patient taking differently: Place 1 drop into both eyes at bedtime.) 7.5 mL 3  . LORazepam (ATIVAN) 0.5 MG tablet Take  0.25-0.5 mg by mouth at bedtime.    Marland Kitchen losartan (COZAAR) 50 MG tablet Take 1 tablet (50 mg total) by mouth daily. 90 tablet 3  . omeprazole (PRILOSEC) 20 MG capsule TAKE 1 CAPSULE EVERY DAY 90 capsule 3  . vitamin B-12 (CYANOCOBALAMIN) 1000 MCG tablet Take 1,000 mcg by mouth 2 (two) times a week.    Marland Kitchen VITAMIN D PO Take 1,000 Units by mouth daily.    . Zinc 30 MG TABS Take by mouth.     No current facility-administered medications on file prior to visit.    Allergies  Allergen Reactions  . Codeine Nausea And Vomiting    Family History  Problem Relation Age of Onset  . Hypercalcemia Neg Hx     BP (!) 160/100 (BP Location: Right Arm, Patient Position: Sitting, Cuff  Size: Normal)   Pulse (!) 119   Ht 5\' 3"  (1.6 m)   Wt 159 lb 12.8 oz (72.5 kg)   SpO2 93%   BMI 28.31 kg/m    Review of Systems     Objective:   Physical Exam VITAL SIGNS:  See vs page GENERAL: no distress GAIT: steady, with a walker.    Lab Results  Component Value Date   PTH 301 (H) 12/28/2020   CALCIUM 10.5 (H) 03/24/2021       Assessment & Plan:  Hyperparathyroidism: uncontrolled.   frail elderly status: in this setting, I hesitate to make any more med adjustments now.    Patient Instructions  Please continue the same cinacalcet.   You should continue to follow up the blood pressure and heart rate with Dr Gardiner Rhyme.   Please come back for a follow-up appointment in 2 months.

## 2021-04-05 DIAGNOSIS — H401131 Primary open-angle glaucoma, bilateral, mild stage: Secondary | ICD-10-CM | POA: Diagnosis not present

## 2021-04-05 DIAGNOSIS — I4891 Unspecified atrial fibrillation: Secondary | ICD-10-CM | POA: Diagnosis not present

## 2021-04-05 DIAGNOSIS — H353131 Nonexudative age-related macular degeneration, bilateral, early dry stage: Secondary | ICD-10-CM | POA: Diagnosis not present

## 2021-04-05 DIAGNOSIS — R6889 Other general symptoms and signs: Secondary | ICD-10-CM | POA: Diagnosis not present

## 2021-04-20 ENCOUNTER — Ambulatory Visit (HOSPITAL_COMMUNITY): Payer: Medicare HMO | Attending: Cardiology

## 2021-04-20 ENCOUNTER — Other Ambulatory Visit: Payer: Self-pay

## 2021-04-20 DIAGNOSIS — I4891 Unspecified atrial fibrillation: Secondary | ICD-10-CM

## 2021-04-20 DIAGNOSIS — R6889 Other general symptoms and signs: Secondary | ICD-10-CM | POA: Diagnosis not present

## 2021-04-20 LAB — ECHOCARDIOGRAM COMPLETE
Area-P 1/2: 4.16 cm2
S' Lateral: 3 cm

## 2021-04-25 ENCOUNTER — Telehealth: Payer: Self-pay | Admitting: *Deleted

## 2021-04-25 NOTE — Telephone Encounter (Signed)
Patient assistance application for Eliquis completed and faxed to [facility].

## 2021-05-18 ENCOUNTER — Encounter: Payer: Self-pay | Admitting: Family Medicine

## 2021-05-19 ENCOUNTER — Encounter: Payer: Self-pay | Admitting: Family Medicine

## 2021-05-19 ENCOUNTER — Other Ambulatory Visit: Payer: Self-pay

## 2021-05-19 ENCOUNTER — Ambulatory Visit (INDEPENDENT_AMBULATORY_CARE_PROVIDER_SITE_OTHER): Payer: Medicare HMO | Admitting: Family Medicine

## 2021-05-19 ENCOUNTER — Other Ambulatory Visit: Payer: Self-pay | Admitting: *Deleted

## 2021-05-19 VITALS — BP 124/70 | HR 117 | Resp 16 | Ht 63.0 in | Wt 152.5 lb

## 2021-05-19 DIAGNOSIS — I4891 Unspecified atrial fibrillation: Secondary | ICD-10-CM | POA: Diagnosis not present

## 2021-05-19 DIAGNOSIS — I10 Essential (primary) hypertension: Secondary | ICD-10-CM

## 2021-05-19 DIAGNOSIS — I700 Atherosclerosis of aorta: Secondary | ICD-10-CM | POA: Diagnosis not present

## 2021-05-19 DIAGNOSIS — E210 Primary hyperparathyroidism: Secondary | ICD-10-CM

## 2021-05-19 DIAGNOSIS — R062 Wheezing: Secondary | ICD-10-CM | POA: Diagnosis not present

## 2021-05-19 DIAGNOSIS — E785 Hyperlipidemia, unspecified: Secondary | ICD-10-CM

## 2021-05-19 LAB — LIPID PANEL
Cholesterol: 184 mg/dL (ref 0–200)
HDL: 63.1 mg/dL (ref >39.00)
LDL Cholesterol: 105 mg/dL — ABNORMAL HIGH (ref 0–99)
NonHDL: 121.07
Total CHOL/HDL Ratio: 3
Triglycerides: 81 mg/dL (ref 0.0–149.0)
VLDL: 16.2 mg/dL (ref 0.0–40.0)

## 2021-05-19 MED ORDER — LOSARTAN POTASSIUM 25 MG PO TABS: 25.0000 mg | ORAL_TABLET | Freq: Every day | ORAL | 1 refills | Status: DC

## 2021-05-19 MED ORDER — APIXABAN 2.5 MG PO TABS: ORAL_TABLET | ORAL | 0 refills | Status: DC

## 2021-05-19 NOTE — Progress Notes (Signed)
HPI: [name] is a 87 y.o. female, who is here today with her son for 5 months follow up.   She was last seen on 12/28/20. Her son would to have labs done.  Since her last visit she has seen endocrinologist, Dr Loanne Drilling, primary hyperthyroidism. Her son is concerned about behavioral changes after she started Sensipar for hyperthyroidism. No MS changes or confusion. She states that she is feeling more energetic, much better, she is able to finish more chores around her house.  Intermittent episodes of lightheadedness. HTN: She is on Losartan 50 mg daily and Amlodipine 5 mg daily.  Home BP readings: 120-130/70-80, A few SBP's 90's-108. Negative for headache, visual changes, chest pain, dyspnea, palpitation, focal weakness, or worsening edema. HR at home 70-90's.  Atrial fib on Eliquis 5 mg bid, which she cannot afford.   She is waiting for Eliquis pt assistance program. She has been taking samples. She has not taken medication since Monday. Son is concerned about bruising on upper and lower extremities,he stays that "she hides things from me." She states that she did not noted problem. No hx of trauma and not tenderness. Negative for nose/gum bleeding,melena,gross hematuria,or red blood in stool.  Lab Results  Component Value Date   CREATININE 0.94 03/24/2021   BUN 13 03/24/2021   NA 137 03/24/2021   K 4.7 03/24/2021   CL 98 03/24/2021   CO2 22 03/24/2021   Lab Results  Component Value Date   WBC 8.8 03/24/2021   HGB 15.1 03/24/2021   HCT 44.9 03/24/2021   MCV 90 03/24/2021   PLT 229 03/24/2021   HLD:She is not on pharmacologic treatment. Aortic atherosclerosis has been seen on imaging,chest CTA in 10/2020.  Lab Results  Component Value Date   CHOL 239 (H) 05/23/2016   HDL 64.50 05/23/2016   LDLCALC 149 (H) 05/23/2016   LDLDIRECT 121.2 06/17/2012   TRIG 125.0 05/23/2016   CHOLHDL 4 05/23/2016   Noted some occasional wheezing during right lung  auscultation. She states that for 2 days she had some mild SOB when outdoors, attributed to hot weather. No associated cough,diaphoresis, or CP.  Review of Systems  Constitutional:  Negative for activity change, appetite change, fatigue and fever.  HENT:  Negative for mouth sores and sore throat.   Gastrointestinal:  Negative for abdominal pain, nausea and vomiting.       Negative for changes in bowel habits.  Genitourinary:  Negative for decreased urine volume and dysuria.  Musculoskeletal:  Positive for gait problem. Negative for myalgias.  Neurological:  Negative for syncope and facial asymmetry.  Psychiatric/Behavioral:  Negative for confusion and hallucinations.   Rest of ROS, see pertinent positives sand negatives in HPI  Current Outpatient Medications on File Prior to Visit  Medication Sig Dispense Refill   acetaminophen (TYLENOL) 500 MG tablet Take 500 mg by mouth every 6 (six) hours as needed (arthritis pain).     amLODipine (NORVASC) 5 MG tablet TAKE 1 TABLET EVERY DAY 90 tablet 3   apixaban (ELIQUIS) 5 MG TABS tablet Take 1 tablet (5 mg total) by mouth 2 (two) times daily. 60 tablet 0   cinacalcet (SENSIPAR) 30 MG tablet Take 1 tablet (30 mg total) by mouth daily. 90 tablet 3   furosemide (LASIX) 40 MG tablet TAKE 1 TABLET THREE TIMES WEEKLY 39 tablet 3   latanoprost (XALATAN) 0.005 % ophthalmic solution INSTILL 1 DROP INTO BOTH EYES AT BEDTIME (Patient taking differently: Place 1 drop into  both eyes at bedtime.) 7.5 mL 3   LORazepam (ATIVAN) 0.5 MG tablet Take 0.25-0.5 mg by mouth at bedtime.     omeprazole (PRILOSEC) 20 MG capsule TAKE 1 CAPSULE EVERY DAY 90 capsule 3   vitamin B-12 (CYANOCOBALAMIN) 1000 MCG tablet Take 1,000 mcg by mouth 2 (two) times a week.     VITAMIN D PO Take 1,000 Units by mouth daily.     Zinc 30 MG TABS Take by mouth.     No current facility-administered medications on file prior to visit.   Past Medical History:  Diagnosis Date   GERD 10/03/2007    HYPERTENSION 10/03/2007   OSTEOPENIA 02/03/2008   Primary hyperparathyroidism ([place]) 10/03/2007   Allergies  Allergen Reactions   Codeine Nausea And Vomiting    Social History   Socioeconomic History   Marital status: Widowed    Spouse name: Not on file   Number of children: Not on file   Years of education: Not on file   Highest education level: Not on file  Occupational History   Occupation: retired  Tobacco Use   Smoking status: Former    Pack years: 0.00    Types: Cigarettes    Quit date: 11/12/1978    Years since quitting: 42.5   Smokeless tobacco: Never  Substance and Sexual Activity   Alcohol use: No   Drug use: No   Sexual activity: Not on file  Other Topics Concern   Not on file  Social History Narrative   Pt states her son and daughter in law live with her   Social Determinants of Health   Financial Resource Strain: Low Risk    Difficulty of Paying Living Expenses: Not hard at all  Food Insecurity: No Food Insecurity   Worried About Charity fundraiser in the Last Year: Never true   [place] in the Last Year: Never true  Transportation Needs: No Transportation Needs   Lack of Transportation (Medical): No   Lack of Transportation (Non-Medical): No  Physical Activity: Inactive   Days of Exercise per Week: 0 days   Minutes of Exercise per Session: 0 min  Stress: No Stress Concern Present   Feeling of Stress : Not at all  Social Connections: Socially Isolated   Frequency of Communication with Friends and Family: More than three times a week   Frequency of Social Gatherings with Friends and Family: Never   Attends Religious Services: Never   Marine scientist or Organizations: No   Attends Archivist Meetings: Never   Marital Status: Widowed   Vitals:   05/19/21 0742  BP: 124/70  Pulse: (!) 117  Resp: 16  SpO2: 90%   Body mass index is 27.01 kg/m.  Physical Exam Vitals and nursing note reviewed.  Constitutional:       General: She is not in acute distress.    Appearance: She is well-developed.  HENT:     Head: Normocephalic and atraumatic.     Mouth/Throat:     Mouth: Mucous membranes are moist.     Pharynx: Oropharynx is clear.  Eyes:     Conjunctiva/sclera: Conjunctivae normal.  Cardiovascular:     Rate and Rhythm: Tachycardia present. Rhythm irregular.     Pulses:          Dorsalis pedis pulses are 2+ on the right side and 2+ on the left side.     Heart sounds: No murmur heard.    Comments: LE trace pitting  edema, bilateral. HR 108/min Pulmonary:     Effort: Pulmonary effort is normal. No respiratory distress.     Breath sounds: Examination of the right-middle field reveals wheezing. Wheezing (sporadic.) present.  Abdominal:     Palpations: Abdomen is soft. There is no hepatomegaly or mass.     Tenderness: There is no abdominal tenderness.  Skin:    General: Skin is warm.     Findings: No erythema or rash.     Comments: A few ecchymosis scarrted on foerarms and LE's.  Neurological:     General: No focal deficit present.     Mental Status: She is alert and oriented to person, place, and time.     Cranial Nerves: No cranial nerve deficit.     Comments: Gait assisted by a walker.  Psychiatric:     Comments: Well groomed, good eye contact.   ASSESSMENT AND PLAN:  Ms. LANGLEY FLATLEY was seen today for 5 months follow-up.  Orders Placed This Encounter  Procedures   Lipid panel   Lab Results  Component Value Date   CHOL 184 05/19/2021   HDL 63.10 05/19/2021   LDLCALC 105 (H) 05/19/2021   LDLDIRECT 121.2 06/17/2012   TRIG 81.0 05/19/2021   CHOLHDL 3 05/19/2021   Hyperlipidemia, unspecified hyperlipidemia type Continue non pharmacologic treatment. Some CV benefits of statin meds discussed.  Essential hypertension Some home BP readings at home mildly low. Recommend decreasing dose of Losartan from 50 mg to 25 mg daily. No changes in Amlodipine dose. Continue monitoring BP at  home.  -     losartan (COZAAR) 25 MG tablet; Take 1 tablet (25 mg total) by mouth daily.  Atrial fibrillation, unspecified type ([place]) Not rate or rhythm controlled. Samples of Eliqus given today to continue 5 mg bid. We discussed some side effects of anticoagulation as well as possible complications of atrial fib. Following with cardiologist, next appt with Dr Gardiner Rhyme on 05/31/21.  Atherosclerosis of aorta ([place]) She is not on statin medication. Further recommendations according to FLP result.  Wheezing Mild. Former smoker. She refused CXR and Albuterol inh.  Primary hyperparathyroidism [facility]) Following with endocrinologist. Recommend son discussing his concerned about medication with Dr Loanne Drilling.  Spent 43 minutes.  During this time history was obtained and documented, examination was performed, prior labs/imaging reviewed, and assessment/plan discussed.  Return in about 6 months (around 11/19/2021).   Charlena Haub G. Martinique, MD  [facility]. [place] office.

## 2021-05-19 NOTE — Patient Instructions (Addendum)
A few things to remember from today's visit:   Essential hypertension - Plan: losartan (COZAAR) 25 MG tablet  Atrial fibrillation, unspecified type ([place])  Hyperlipidemia, unspecified hyperlipidemia type - Plan: Lipid panel  Atherosclerosis of aorta ([place])  If you need refills please call your pharmacy. Do not use My Chart to request refills or for acute issues that need immediate attention.   Losartan decreased from 50 mg to 25 mg because some low blood pressure at home that could be causing lightheadedness. Fall precautions. Eliquis samples given today.  Today I am checking your cholesterol. Keep next appt with your cardiologist and endocrinologist.  Little wheezing today, let me know if you change your mind and decide to have chest X ray done or start course of albuterol. If cough, shortness breath, or worsening wheezing you need to let me know and may need to go to the ER.  Please be sure medication list is accurate. If a new problem present, please set up appointment sooner than planned today.

## 2021-05-24 ENCOUNTER — Ambulatory Visit: Payer: Medicare HMO | Admitting: Family Medicine

## 2021-05-28 NOTE — Progress Notes (Deleted)
Cardiology Office Note:    Date:  05/28/2021   ID:  Annette [name], DOB 03/29/1930, MRN [id number]  PCP:  Martinique, [name], MD  Cardiologist:  None  Electrophysiologist:  None   Referring MD: Martinique, [name], MD   No chief complaint on file.   History of Present Illness:    Annette [name] is a 87 y.o. female with a hx of hypertension, PE, primary hyperparathyroidism who presents for follow-up.  She was referred by Dr. Martinique for evaluation of atrial fibrillation, initially seen on 03/24/2021.  From review of prior EKGs, was in sinus rhythm on EKG 08/18/2018 but has been A. fib since EKG on 11/12/2019.  She was admitted from 11/11/2022 11/16/2019 with submassive PE.  She was started on heparin drip and transition to Eliquis.  Echocardiogram on 11/2019 showed normal biventricular function, no significant valvular disease.  She took Eliquis x 6 months then stopped.  Denies any chest pain, dyspnea, lightheadedness, syncope, lower extremity edema, or palpitations.  Denies any bleeding or falls. Quit smoking in 1980.  No known history of heart disease in her immediate family.  Zio patch was prescribed for 3 days on 04/05/2021 but only wore for 9 hours, showed 1% A. fib burden with average rate 71 bpm.  Echocardiogram on 04/20/2021 showed normal biventricular function RVSP 35, mild to moderate TR, mild MR, PFO.  Wt Readings from Last 3 Encounters:  05/19/21 152 lb 8 oz (69.2 kg)  03/31/21 159 lb 12.8 oz (72.5 kg)  03/24/21 160 lb (72.6 kg)     Past Medical History:  Diagnosis Date   GERD 10/03/2007   HYPERTENSION 10/03/2007   OSTEOPENIA 02/03/2008   Primary hyperparathyroidism ([place]) 10/03/2007    Past Surgical History:  Procedure Laterality Date   ABDOMINAL HYSTERECTOMY      Current Medications: No outpatient medications have been marked as taking for the 05/31/21 encounter (Appointment) with [name], MD.     Allergies:   Codeine   Social History   Socioeconomic  History   Marital status: Widowed    Spouse name: Not on file   Number of children: Not on file   Years of education: Not on file   Highest education level: Not on file  Occupational History   Occupation: retired  Tobacco Use   Smoking status: Former    Types: Cigarettes    Quit date: 11/12/1978    Years since quitting: 42.5   Smokeless tobacco: Never  Substance and Sexual Activity   Alcohol use: No   Drug use: No   Sexual activity: Not on file  Other Topics Concern   Not on file  Social History Narrative   Pt states her son and daughter in law live with her   Social Determinants of Health   Financial Resource Strain: Low Risk    Difficulty of Paying Living Expenses: Not hard at all  Food Insecurity: No Food Insecurity   Worried About Charity fundraiser in the Last Year: Never true   [place] in the Last Year: Never true  Transportation Needs: No Transportation Needs   Lack of Transportation (Medical): No   Lack of Transportation (Non-Medical): No  Physical Activity: Inactive   Days of Exercise per Week: 0 days   Minutes of Exercise per Session: 0 min  Stress: No Stress Concern Present   Feeling of Stress : Not at all  Social Connections: Socially Isolated   Frequency of Communication with Friends and Family:  More than three times a week   Frequency of Social Gatherings with Friends and Family: Never   Attends Religious Services: Never   Marine scientist or Organizations: No   Attends Archivist Meetings: Never   Marital Status: Widowed     Family History: The patient's family history is negative for Hypercalcemia.  ROS:   Please see the history of present illness.     All other systems reviewed and are negative.  EKGs/Labs/Other Studies Reviewed:    The following studies were reviewed today:   EKG:  EKG is  ordered today.  The ekg ordered today demonstrates atrial fibrillation, rate 97, left axis deviation, poor R wave  progression  Recent Labs: 12/28/2020: ALT 15; TSH 1.30 03/24/2021: BUN 13; Creatinine, Ser 0.94; Hemoglobin 15.1; Magnesium 1.8; Platelets 229; Potassium 4.7; Sodium 137  Recent Lipid Panel    Component Value Date/Time   CHOL 184 05/19/2021 0834   TRIG 81.0 05/19/2021 0834   HDL 63.10 05/19/2021 0834   CHOLHDL 3 05/19/2021 0834   VLDL 16.2 05/19/2021 0834   LDLCALC 105 (H) 05/19/2021 0834   LDLDIRECT 121.2 06/17/2012 0931    Physical Exam:    VS:  There were no vitals taken for this visit.    Wt Readings from Last 3 Encounters:  05/19/21 152 lb 8 oz (69.2 kg)  03/31/21 159 lb 12.8 oz (72.5 kg)  03/24/21 160 lb (72.6 kg)     GEN:  in no acute distress HEENT: Normal NECK: No JVD LYMPHATICS: No lymphadenopathy CARDIAC: Irregular, normal rate, no murmurs, rubs, gallops RESPIRATORY:  Clear to auscultation without rales, wheezing or rhonchi  ABDOMEN: Soft, non-tender, non-distended MUSCULOSKELETAL: 1+ edema SKIN: Warm and dry NEUROLOGIC:  Alert and oriented x 3 PSYCHIATRIC:  Normal affect   ASSESSMENT:    No diagnosis found.  PLAN:    Atrial fibrillation: Persistent versus chronic.  From review of prior EKGs, appears to have been in A. fib since her PE in January 2021.  CHA2DS2-VASc 5 (hypertension, age x2, PE, female).  Zio patch was prescribed for 3 days on 04/05/2021 but only wore for 9 hours, showed 1% A. fib burden with average rate 71 bpm.  Echocardiogram on 04/20/2021 showed normal biventricular function RVSP 35, mild to moderate TR, mild MR, PFO. -Previously refused anticoagulation but was agreeable at last clinic visit, started Eliquis 5 mg twice daily -Appears rate controlled despite no AV nodal blocking agents.  Chronic diastolic heart failure: On Lasix 40 mg 3 times weekly.  Will check BMP, magnesium  Hypertension: On losartan 50 mg daily and amlodipine 5 mg daily.  Appears controlled  RTC in ***  Medication Adjustments/Labs and Tests Ordered: Current  medicines are reviewed at length with the patient today.  Concerns regarding medicines are outlined above.  No orders of the defined types were placed in this encounter.  No orders of the defined types were placed in this encounter.   There are no Patient Instructions on file for this visit.   Signed, [name], MD  05/28/2021 4:01 PM    [facility]

## 2021-05-29 NOTE — Progress Notes (Signed)
Cardiology Office Note:    Date:  05/31/2021   ID:  Annette [name], DOB Mar 20, 1930, MRN [id number]  PCP:  Martinique, [name], MD  Cardiologist:  None  Electrophysiologist:  None   Referring MD: Martinique, [name], MD   Chief Complaint  Patient presents with   Follow-up    6-8 weeks.   Headache   Shortness of Breath   Edema    Legs and ankles.     History of Present Illness:    Annette [name] is a 87 y.o. female with a hx of hypertension, PE, primary hyperparathyroidism who presents for follow-up.  She was referred by Dr. Martinique for evaluation of atrial fibrillation, initially seen on 03/24/2021.  From review of prior EKGs, was in sinus rhythm on EKG 08/18/2018 but has been A. fib since EKG on 11/12/2019.  She was admitted from 11/11/2022 11/16/2019 with submassive PE.  She was started on heparin drip and transition to Eliquis.  Echocardiogram on 11/2019 showed normal biventricular function, no significant valvular disease.  She took Eliquis x 6 months then stopped.  Denies any chest pain, dyspnea, lightheadedness, syncope, lower extremity edema, or palpitations.  Denies any bleeding or falls. Quit smoking in 1980.  No known history of heart disease in her immediate family.  Zio patch was prescribed for 3 days on 04/05/2021 but only wore for 9 hours, showed 1% A. fib burden with average rate 71 bpm.  Echocardiogram on 04/20/2021 showed normal biventricular function RVSP 35, mild to moderate TR, mild MR, PFO.  Since last clinic visit, she is feeling good. She says this morning, she experienced lightheadedness that lasted a few seconds after eating her breakfast. She says Dr. Martinique cut her dosage of Losartan 50 MG down to 25 MG (05/19/2021). Since the decrease of Losartan her systolic blood pressures ranges 509T-267T and diastolic 24P-80D at home. Denies chest pains, syncope or palpitations. She works in her yard with her walker.     Wt Readings from Last 3 Encounters:  05/31/21 155 lb (70.3  kg)  05/19/21 152 lb 8 oz (69.2 kg)  03/31/21 159 lb 12.8 oz (72.5 kg)     Past Medical History:  Diagnosis Date   GERD 10/03/2007   HYPERTENSION 10/03/2007   OSTEOPENIA 02/03/2008   Primary hyperparathyroidism ([place]) 10/03/2007    Past Surgical History:  Procedure Laterality Date   ABDOMINAL HYSTERECTOMY      Current Medications: Current Meds  Medication Sig   acetaminophen (TYLENOL) 500 MG tablet Take 500 mg by mouth every 6 (six) hours as needed (arthritis pain).   amLODipine (NORVASC) 5 MG tablet TAKE 1 TABLET EVERY DAY   apixaban (ELIQUIS) 2.5 MG TABS tablet Lot: [id number] Exp: 11/23   apixaban (ELIQUIS) 5 MG TABS tablet Take 1 tablet (5 mg total) by mouth 2 (two) times daily.   cinacalcet (SENSIPAR) 30 MG tablet Take 1 tablet (30 mg total) by mouth daily.   furosemide (LASIX) 40 MG tablet TAKE 1 TABLET THREE TIMES WEEKLY   latanoprost (XALATAN) 0.005 % ophthalmic solution INSTILL 1 DROP INTO BOTH EYES AT BEDTIME (Patient taking differently: Place 1 drop into both eyes at bedtime.)   LORazepam (ATIVAN) 0.5 MG tablet Take 0.25-0.5 mg by mouth at bedtime.   losartan (COZAAR) 25 MG tablet Take 1 tablet (25 mg total) by mouth daily.   omeprazole (PRILOSEC) 20 MG capsule TAKE 1 CAPSULE EVERY DAY   vitamin B-12 (CYANOCOBALAMIN) 1000 MCG tablet Take 1,000 mcg by mouth 2 (two) times  a week.   VITAMIN D PO Take 1,000 Units by mouth daily.   Zinc 30 MG TABS Take by mouth.     Allergies:   Codeine   Social History   Socioeconomic History   Marital status: Widowed    Spouse name: Not on file   Number of children: Not on file   Years of education: Not on file   Highest education level: Not on file  Occupational History   Occupation: retired  Tobacco Use   Smoking status: Former    Types: Cigarettes    Quit date: 11/12/1978    Years since quitting: 42.5   Smokeless tobacco: Never  Substance and Sexual Activity   Alcohol use: No   Drug use: No   Sexual activity: Not on  file  Other Topics Concern   Not on file  Social History Narrative   Pt states her son and daughter in law live with her   Social Determinants of Health   Financial Resource Strain: Low Risk    Difficulty of Paying Living Expenses: Not hard at all  Food Insecurity: No Food Insecurity   Worried About Charity fundraiser in the Last Year: Never true   [place] in the Last Year: Never true  Transportation Needs: No Transportation Needs   Lack of Transportation (Medical): No   Lack of Transportation (Non-Medical): No  Physical Activity: Inactive   Days of Exercise per Week: 0 days   Minutes of Exercise per Session: 0 min  Stress: No Stress Concern Present   Feeling of Stress : Not at all  Social Connections: Socially Isolated   Frequency of Communication with Friends and Family: More than three times a week   Frequency of Social Gatherings with Friends and Family: Never   Attends Religious Services: Never   Marine scientist or Organizations: No   Attends Archivist Meetings: Never   Marital Status: Widowed     Family History: The patient's family history is negative for Hypercalcemia.  ROS:   Please see the history of present illness.    (+) lightheadedness  All other systems reviewed and are negative.  EKGs/Labs/Other Studies Reviewed:    The following studies were reviewed today:  ECHO 04/20/2021  IMPRESSIONS    1. Left ventricular ejection fraction, by estimation, is 55 to 60%. The  left ventricle has normal function. The left ventricle has no regional  wall motion abnormalities. There is mild left ventricular hypertrophy.  Left ventricular diastolic parameters  are indeterminate.   2. Right ventricular systolic function is low normal. The right  ventricular size is normal. There is mildly elevated pulmonary artery  systolic pressure. The estimated right ventricular systolic pressure is  21.3 mmHg.   3. Right atrial size was mildly dilated.    4. The mitral valve is normal in structure. Mild mitral valve  regurgitation. No evidence of mitral stenosis.   5. Tricuspid valve regurgitation is mild to moderate.   6. The aortic valve is tricuspid. Aortic valve regurgitation is not  visualized. Mild aortic valve sclerosis is present, with no evidence of  aortic valve stenosis.   7. The inferior vena cava is normal in size with greater than 50%  respiratory variability, suggesting right atrial pressure of 3 mmHg.   8. Evidence of atrial level shunting detected by color flow Doppler.  There is a patent foramen ovale with predominantly left to right shunting  across the atrial septum.   LONG  TERM MONITOR 04/05/2021  Study Highlights   Only wore monitor for 9 hours 100% A. fib burden, with average heart rate 71 bpm     Patch Wear Time:  0 days and 9 hours (2022-05-17T02:22:30-0400 to 2022-05-17T12:00:43-0400)   Atrial Fibrillation occurred continuously (100% burden), ranging from 56-105 bpm (avg of 71 bpm). Isolated VEs were rare (<1.0%), VE Couplets were rare (<1.0%), and no VE Triplets were present.   Echo 11/13/2019:   IMPRESSIONS    1. Left ventricular ejection fraction, by visual estimation, is 60 to  65%. The left ventricle has normal function. There is borderline left  ventricular hypertrophy.   2. Left ventricular diastolic parameters are indeterminate.   3. The left ventricle has no regional wall motion abnormalities.   4. Global right ventricle has normal systolic function.The right  ventricular size is mildly enlarged. No increase in right ventricular wall  thickness.   5. Left atrial size was normal.   6. Right atrial size was normal.   7. The mitral valve is grossly normal. Mild mitral valve regurgitation.   8. The tricuspid valve is grossly normal.   9. The aortic valve is tricuspid. Aortic valve regurgitation is not  visualized.  10. The pulmonic valve was grossly normal. Pulmonic valve regurgitation is   trivial.  11. Mildly elevated pulmonary artery systolic pressure.  12. The tricuspid regurgitant velocity is 2.80 m/s, and with an assumed  right atrial pressure of 8 mmHg, the estimated right ventricular systolic  pressure is mildly elevated at 39.4 mmHg.  13. The inferior vena cava is dilated in size with >50% respiratory  variability, suggesting right atrial pressure of 8 mmHg.  14. Evidence of atrial level shunting detected by color flow Doppler.  EKG:   05/31/2021: atrial fibrillation, rate 105, PVCs 03/24/21: atrial fibrillation, rate 97, left axis deviation, poor R wave progression  Recent Labs: 12/28/2020: ALT 15; TSH 1.30 03/24/2021: BUN 13; Creatinine, Ser 0.94; Hemoglobin 15.1; Magnesium 1.8; Platelets 229; Potassium 4.7; Sodium 137  Recent Lipid Panel    Component Value Date/Time   CHOL 184 05/19/2021 0834   TRIG 81.0 05/19/2021 0834   HDL 63.10 05/19/2021 0834   CHOLHDL 3 05/19/2021 0834   VLDL 16.2 05/19/2021 0834   LDLCALC 105 (H) 05/19/2021 0834   LDLDIRECT 121.2 06/17/2012 0931    Physical Exam:    VS:  BP 112/74 (BP Location: Left Arm, Patient Position: Sitting, Cuff Size: Large)   Pulse (!) 105   Ht 5\' 3"  (1.6 m)   Wt 155 lb (70.3 kg)   BMI 27.46 kg/m     Wt Readings from Last 3 Encounters:  05/31/21 155 lb (70.3 kg)  05/19/21 152 lb 8 oz (69.2 kg)  03/31/21 159 lb 12.8 oz (72.5 kg)     GEN:  in no acute distress HEENT: Normal NECK: No JVD LYMPHATICS: No lymphadenopathy CARDIAC: Irregular, normal rate, no murmurs, rubs, gallops RESPIRATORY:  Clear to auscultation without rales, wheezing or rhonchi  ABDOMEN: Soft, non-tender, non-distended MUSCULOSKELETAL: trace edema SKIN: Warm and dry NEUROLOGIC:  Alert and oriented x 3 PSYCHIATRIC:  Normal affect   ASSESSMENT:    1. Atrial fibrillation, unspecified type ([place])   2. Chronic diastolic heart failure ([place])   3. Essential hypertension     PLAN:    Atrial fibrillation: Persistent versus  chronic.  From review of prior EKGs, appears to have been in A. fib since her PE in January 2021.  CHA2DS2-VASc 5 (hypertension, age x2, PE, female).  Zio patch was prescribed for 3 days on 04/05/2021 but only wore for 9 hours, showed 100% A. fib burden with average rate 71 bpm.  Echocardiogram on 04/20/2021 showed normal biventricular function RVSP 35, mild to moderate TR, mild MR, PFO. -Previously refused anticoagulation but was agreeable at last clinic visit, started Eliquis 5 mg twice daily -Appears appears rate controlled despite no AV nodal blocking agents on Zio but only wore for 9 hours and mildly elevated in clinic today.  Will repeat Zio patch x3 days  Chronic diastolic heart failure: On Lasix 40 mg 3 times weekly.    Hypertension: On losartan 25 mg daily and amlodipine 5 mg daily.  Appears controlled  RTC in 6 months  Medication Adjustments/Labs and Tests Ordered: Current medicines are reviewed at length with the patient today.  Concerns regarding medicines are outlined above.  Orders Placed This Encounter  Procedures   LONG TERM MONITOR (3-14 DAYS)   EKG 12-Lead    No orders of the defined types were placed in this encounter.   Patient Instructions  Medication Instructions:  Your physician recommends that you continue on your current medications as directed. Please refer to the Current Medication list given to you today.  *If you need a refill on your cardiac medications before your next appointment, please call your pharmacy*  Testing/Procedures: [place] Monitor Instructions   Your physician has requested you wear a ZIO patch monitor for _3__ days.  This is a single patch monitor.   [employer] supplies one patch monitor per enrollment. Additional stickers are not available. Please do not apply patch if you will be having a Nuclear Stress Test, Echocardiogram, Cardiac CT, MRI, or Chest Xray during the period you would be wearing the monitor. The patch cannot be worn  during these tests. You cannot remove and re-apply the ZIO XT patch monitor.  Your ZIO patch monitor will be sent Fed Ex from [employer] directly to your home address. It may take 3-5 days to receive your monitor after you have been enrolled.  Once you have received your monitor, please review the enclosed instructions. Your monitor has already been registered assigning a specific monitor serial # to you.  Billing and Patient Assistance Program Information   We have supplied [employer] with any of your insurance information on file for billing purposes. [employer] offers a sliding scale Patient Assistance Program for patients that do not have insurance, or whose insurance does not completely cover the cost of the ZIO monitor.   You must apply for the Patient Assistance Program to qualify for this discounted rate.     To apply, please call [employer] at [phone], select option 4, then select option 2, and ask to apply for Patient Assistance Program.  Theodore Demark will ask your household income, and how many people are in your household.  They will quote your out-of-pocket cost based on that information.  [employer] will also be able to set up a 2-month, interest-free payment plan if needed.  Applying the monitor   Shave hair from upper left chest.  Hold abrader disc by orange tab. Rub abrader in 40 strokes over the upper left chest as indicated in your monitor instructions.  Clean area with 4 enclosed alcohol pads. Let dry.  Apply patch as indicated in monitor instructions. Patch will be placed under collarbone on left side of chest with arrow pointing upward.  Rub patch adhesive wings for 2 minutes. Remove white label marked "1". Remove the white label marked "  2". Rub patch adhesive wings for 2 additional minutes.  While looking in a mirror, press and release button in center of patch. A small green light will flash 3-4 times. This will be your only indicator that the monitor has been turned on. ?  Do  not shower for the first 24 hours. You may shower after the first 24 hours.  Press the button if you feel a symptom. You will hear a small click. Record Date, Time and Symptom in the Patient Logbook.  When you are ready to remove the patch, follow instructions on the last 2 pages of the Patient Logbook. Stick patch monitor onto the last page of Patient Logbook.  Place Patient Logbook in the blue and white box.  Use locking tab on box and tape box closed securely.  The blue and white box has prepaid postage on it. Please place it in the mailbox as soon as possible. Your physician should have your test results approximately 7 days after the monitor has been mailed back to [facility].  Call [place] at [phone] if you have questions regarding your ZIO XT patch monitor. Call them immediately if you see an orange light blinking on your monitor.  If your monitor falls off in less than 4 days, contact our Monitor department at [phone]. ?If your monitor becomes loose or falls off after 4 days call [employer] at [phone] for suggestions on securing your monitor.?  Follow-Up: At [facility], you and your health needs are our priority.  As part of our continuing mission to provide you with exceptional heart care, we have created designated Provider Care Teams.  These Care Teams include your primary Cardiologist (physician) and Advanced Practice Providers (APPs -  Physician Assistants and Nurse Practitioners) who all work together to provide you with the care you need, when you need it.  We recommend signing up for the patient portal called "MyChart".  Sign up information is provided on this After Visit Summary.  MyChart is used to connect with patients for Virtual Visits (Telemedicine).  Patients are able to view lab/test results, encounter notes, upcoming appointments, etc.  Non-urgent messages can be sent to your provider as well.   To learn more about what you can do with  MyChart, go to [email or web address].    Your next appointment:   6 month(s)  The format for your next appointment:   In Person  Provider:   Oswaldo Milian, MD      Ardell Isaacs as a scribe for [name], MD.,have documented all relevant documentation on the behalf of [name], MD,as directed by  [name], MD while in the presence of [name], MD.  I, [name], MD, have reviewed all documentation for this visit. The documentation on 05/31/21 for the exam, diagnosis, procedures, and orders are all accurate and complete.   Signed, [name], MD  05/31/2021 3:14 PM    [facility]

## 2021-05-31 ENCOUNTER — Encounter: Payer: Self-pay | Admitting: Cardiology

## 2021-05-31 ENCOUNTER — Ambulatory Visit (INDEPENDENT_AMBULATORY_CARE_PROVIDER_SITE_OTHER): Payer: Medicare HMO

## 2021-05-31 ENCOUNTER — Other Ambulatory Visit: Payer: Self-pay

## 2021-05-31 ENCOUNTER — Ambulatory Visit: Payer: Medicare HMO | Admitting: Cardiology

## 2021-05-31 VITALS — BP 112/74 | HR 105 | Ht 63.0 in | Wt 155.0 lb

## 2021-05-31 DIAGNOSIS — I10 Essential (primary) hypertension: Secondary | ICD-10-CM | POA: Diagnosis not present

## 2021-05-31 DIAGNOSIS — I5032 Chronic diastolic (congestive) heart failure: Secondary | ICD-10-CM | POA: Diagnosis not present

## 2021-05-31 DIAGNOSIS — R6889 Other general symptoms and signs: Secondary | ICD-10-CM | POA: Diagnosis not present

## 2021-05-31 DIAGNOSIS — I4891 Unspecified atrial fibrillation: Secondary | ICD-10-CM

## 2021-05-31 NOTE — Patient Instructions (Signed)
Medication Instructions:  Your physician recommends that you continue on your current medications as directed. Please refer to the Current Medication list given to you today.  *If you need a refill on your cardiac medications before your next appointment, please call your pharmacy*  Testing/Procedures: [place] Monitor Instructions   Your physician has requested you wear a ZIO patch monitor for _3__ days.  This is a single patch monitor.   [employer] supplies one patch monitor per enrollment. Additional stickers are not available. Please do not apply patch if you will be having a Nuclear Stress Test, Echocardiogram, Cardiac CT, MRI, or Chest Xray during the period you would be wearing the monitor. The patch cannot be worn during these tests. You cannot remove and re-apply the ZIO XT patch monitor.  Your ZIO patch monitor will be sent Fed Ex from [employer] directly to your home address. It may take 3-5 days to receive your monitor after you have been enrolled.  Once you have received your monitor, please review the enclosed instructions. Your monitor has already been registered assigning a specific monitor serial # to you.  Billing and Patient Assistance Program Information   We have supplied [employer] with any of your insurance information on file for billing purposes. [employer] offers a sliding scale Patient Assistance Program for patients that do not have insurance, or whose insurance does not completely cover the cost of the ZIO monitor.   You must apply for the Patient Assistance Program to qualify for this discounted rate.     To apply, please call [employer] at [phone], select option 4, then select option 2, and ask to apply for Patient Assistance Program.  Theodore Demark will ask your household income, and how many people are in your household.  They will quote your out-of-pocket cost based on that information.  [employer] will also be able to set up a 27-month, interest-free payment plan  if needed.  Applying the monitor   Shave hair from upper left chest.  Hold abrader disc by orange tab. Rub abrader in 40 strokes over the upper left chest as indicated in your monitor instructions.  Clean area with 4 enclosed alcohol pads. Let dry.  Apply patch as indicated in monitor instructions. Patch will be placed under collarbone on left side of chest with arrow pointing upward.  Rub patch adhesive wings for 2 minutes. Remove white label marked "1". Remove the white label marked "2". Rub patch adhesive wings for 2 additional minutes.  While looking in a mirror, press and release button in center of patch. A small green light will flash 3-4 times. This will be your only indicator that the monitor has been turned on. ?  Do not shower for the first 24 hours. You may shower after the first 24 hours.  Press the button if you feel a symptom. You will hear a small click. Record Date, Time and Symptom in the Patient Logbook.  When you are ready to remove the patch, follow instructions on the last 2 pages of the Patient Logbook. Stick patch monitor onto the last page of Patient Logbook.  Place Patient Logbook in the blue and white box.  Use locking tab on box and tape box closed securely.  The blue and white box has prepaid postage on it. Please place it in the mailbox as soon as possible. Your physician should have your test results approximately 7 days after the monitor has been mailed back to [facility].  Call [facility]  at [phone] if you have questions regarding your ZIO XT patch monitor. Call them immediately if you see an orange light blinking on your monitor.  If your monitor falls off in less than 4 days, contact our Monitor department at [phone]. ?If your monitor becomes loose or falls off after 4 days call [employer] at [phone] for suggestions on securing your monitor.?  Follow-Up: At [facility], you and your health needs are our priority.  As part of  our continuing mission to provide you with exceptional heart care, we have created designated Provider Care Teams.  These Care Teams include your primary Cardiologist (physician) and Advanced Practice Providers (APPs -  Physician Assistants and Nurse Practitioners) who all work together to provide you with the care you need, when you need it.  We recommend signing up for the patient portal called "MyChart".  Sign up information is provided on this After Visit Summary.  MyChart is used to connect with patients for Virtual Visits (Telemedicine).  Patients are able to view lab/test results, encounter notes, upcoming appointments, etc.  Non-urgent messages can be sent to your provider as well.   To learn more about what you can do with MyChart, go to [email or web address].    Your next appointment:   6 month(s)  The format for your next appointment:   In Person  Provider:   Oswaldo Milian, MD

## 2021-05-31 NOTE — Progress Notes (Unsigned)
Enrolled patient for a 3 day Zio XT monitor to be mailed to patients home  

## 2021-06-03 DIAGNOSIS — I4891 Unspecified atrial fibrillation: Secondary | ICD-10-CM | POA: Diagnosis not present

## 2021-06-06 ENCOUNTER — Ambulatory Visit (INDEPENDENT_AMBULATORY_CARE_PROVIDER_SITE_OTHER): Payer: Medicare HMO | Admitting: Endocrinology

## 2021-06-06 ENCOUNTER — Other Ambulatory Visit: Payer: Self-pay

## 2021-06-06 NOTE — Patient Instructions (Addendum)
Blood tests are requested for you today.  We'll let you know about the results.  You should continue to follow up the blood pressure and heart rate with Dr Gardiner Rhyme.   Please come back for a follow-up appointment in 4 months.

## 2021-06-06 NOTE — Progress Notes (Signed)
Subjective:    Patient ID: Annette [name], female    DOB: July 14, 1930, 87 y.o.   MRN: [id number]  HPI Pt returns for f/u of primary hyperparathyroidism (dx'ed 2008; she has never had osteoporosis or urolithiasis; only bony fracture was right wrist (1988, with an injury)). Due to multiple med probs and advanced age; she is not a surgical candidate; she was rx'ed cinacalcet; she takes Vit-D, 1000 units/d).  Since on cinacalcet, pt states she feels no different.  she brings a record of her BP and HR which I have reviewed today, and all are normal.   Past Medical History:  Diagnosis Date   GERD 10/03/2007   HYPERTENSION 10/03/2007   OSTEOPENIA 02/03/2008   Primary hyperparathyroidism ([place]) 10/03/2007    Past Surgical History:  Procedure Laterality Date   ABDOMINAL HYSTERECTOMY      Social History   Socioeconomic History   Marital status: Widowed    Spouse name: Not on file   Number of children: Not on file   Years of education: Not on file   Highest education level: Not on file  Occupational History   Occupation: retired  Tobacco Use   Smoking status: Former    Types: Cigarettes    Quit date: 11/12/1978    Years since quitting: 42.6   Smokeless tobacco: Never  Substance and Sexual Activity   Alcohol use: No   Drug use: No   Sexual activity: Not on file  Other Topics Concern   Not on file  Social History Narrative   Pt states her son and daughter in law live with her   Social Determinants of Health   Financial Resource Strain: Low Risk    Difficulty of Paying Living Expenses: Not hard at all  Food Insecurity: No Food Insecurity   Worried About Charity fundraiser in the Last Year: Never true   Uncertain in the Last Year: Never true  Transportation Needs: No Transportation Needs   Lack of Transportation (Medical): No   Lack of Transportation (Non-Medical): No  Physical Activity: Inactive   Days of Exercise per Week: 0 days   Minutes of Exercise per Session: 0  min  Stress: No Stress Concern Present   Feeling of Stress : Not at all  Social Connections: Socially Isolated   Frequency of Communication with Friends and Family: More than three times a week   Frequency of Social Gatherings with Friends and Family: Never   Attends Religious Services: Never   Marine scientist or Organizations: No   Attends Archivist Meetings: Never   Marital Status: Widowed  Human resources officer Violence: Not At Risk   Fear of Current or Ex-Partner: No   Emotionally Abused: No   Physically Abused: No   Sexually Abused: No    Current Outpatient Medications on File Prior to Visit  Medication Sig Dispense Refill   acetaminophen (TYLENOL) 500 MG tablet Take 500 mg by mouth every 6 (six) hours as needed (arthritis pain).     amLODipine (NORVASC) 5 MG tablet TAKE 1 TABLET EVERY DAY 90 tablet 3   apixaban (ELIQUIS) 2.5 MG TABS tablet Lot: [id number] Exp: 11/23 60 tablet 0   apixaban (ELIQUIS) 5 MG TABS tablet Take 1 tablet (5 mg total) by mouth 2 (two) times daily. 60 tablet 0   cinacalcet (SENSIPAR) 30 MG tablet Take 1 tablet (30 mg total) by mouth daily. 90 tablet 3   furosemide (LASIX) 40 MG tablet TAKE 1 TABLET  THREE TIMES WEEKLY 39 tablet 3   latanoprost (XALATAN) 0.005 % ophthalmic solution INSTILL 1 DROP INTO BOTH EYES AT BEDTIME (Patient taking differently: Place 1 drop into both eyes at bedtime.) 7.5 mL 3   LORazepam (ATIVAN) 0.5 MG tablet Take 0.25-0.5 mg by mouth at bedtime.     losartan (COZAAR) 25 MG tablet Take 1 tablet (25 mg total) by mouth daily. 90 tablet 1   omeprazole (PRILOSEC) 20 MG capsule TAKE 1 CAPSULE EVERY DAY 90 capsule 3   vitamin B-12 (CYANOCOBALAMIN) 1000 MCG tablet Take 1,000 mcg by mouth 2 (two) times a week.     VITAMIN D PO Take 1,000 Units by mouth daily.     Zinc 30 MG TABS Take by mouth.     No current facility-administered medications on file prior to visit.    Allergies  Allergen Reactions   Codeine Nausea And  Vomiting    Family History  Problem Relation Age of Onset   Hypercalcemia Neg Hx     BP (!) 160/100 (BP Location: Right Arm, Patient Position: Sitting, Cuff Size: Normal)   Pulse (!) 109   Ht '5\' 3"'$  (1.6 m)   Wt 153 lb 9.6 oz (69.7 kg)   SpO2 93%   BMI 27.21 kg/m    Review of Systems     Objective:   Physical Exam VITAL SIGNS:  See vs page GENERAL: no distress. GAIT: steady, with a walker.    Lab Results  Component Value Date   PTH 109 (H) 06/06/2021   CALCIUM 10.5 (H) 06/06/2021   25-OH Vit-D=57    Assessment & Plan:  Vit-D def: well-controlled.  Please continue the same Vit-D Primary hyperparathyroidism: well-controlled.  Please continue the same cinacalcet

## 2021-06-07 LAB — PTH, INTACT AND CALCIUM
Calcium: 10.5 mg/dL — ABNORMAL HIGH (ref 8.6–10.4)
PTH: 109 pg/mL — ABNORMAL HIGH (ref 16–77)

## 2021-06-07 LAB — VITAMIN D 25 HYDROXY (VIT D DEFICIENCY, FRACTURES): VITD: 57.31 ng/mL (ref 30.00–100.00)

## 2021-06-12 DIAGNOSIS — I4891 Unspecified atrial fibrillation: Secondary | ICD-10-CM | POA: Diagnosis not present

## 2021-06-17 ENCOUNTER — Other Ambulatory Visit: Payer: Self-pay | Admitting: Family Medicine

## 2021-06-27 ENCOUNTER — Encounter: Payer: Self-pay | Admitting: Family Medicine

## 2021-06-28 ENCOUNTER — Other Ambulatory Visit: Payer: Self-pay

## 2021-06-28 MED ORDER — PRAVASTATIN SODIUM 20 MG PO TABS: 20.0000 mg | ORAL_TABLET | Freq: Every day | ORAL | 0 refills | Status: DC

## 2021-06-29 NOTE — Telephone Encounter (Signed)
Spoke with patient to schedule awv  She stated she wanted her rx sent to  [facility]  (new name [facility])

## 2021-07-03 ENCOUNTER — Telehealth: Payer: Self-pay | Admitting: Cardiology

## 2021-07-03 NOTE — Telephone Encounter (Signed)
ZIO report uploaded to epic, pending provider review. Message routed to MD/RN

## 2021-07-03 NOTE — Telephone Encounter (Signed)
Pt is calling in regards to her results from her wearing the  Zio Heart monitor for 3 days, pt states she returned it back on 06/05/21 and would like her results.

## 2021-07-04 NOTE — Telephone Encounter (Signed)
Zio shows 100% afib burden, but heart rate is normal.  No changes recommended

## 2021-07-04 NOTE — Telephone Encounter (Signed)
Spoke to patient, aware of results and verbalized understanding.

## 2021-07-12 ENCOUNTER — Other Ambulatory Visit: Payer: Self-pay

## 2021-07-12 ENCOUNTER — Ambulatory Visit (INDEPENDENT_AMBULATORY_CARE_PROVIDER_SITE_OTHER): Payer: Medicare HMO

## 2021-07-12 DIAGNOSIS — Z0000 Encounter for general adult medical examination without abnormal findings: Secondary | ICD-10-CM | POA: Diagnosis not present

## 2021-07-12 NOTE — Patient Instructions (Signed)
Annette [name] , Thank you for taking time to come for your Medicare Wellness Visit. I appreciate your ongoing commitment to your health goals. Please review the following plan we discussed and let me know if I can assist you in the future.   Screening recommendations/referrals: Colonoscopy: no longer required  Mammogram: no longer required Recommended yearly ophthalmology/optometry visit for glaucoma screening and checkup Recommended yearly dental visit for hygiene and checkup  Vaccinations: Influenza vaccine: due Pneumococcal vaccine: Completed  Tdap vaccine: Done 08/31/20 Shingles vaccine: Shingrix discussed. Please contact your pharmacy for coverage information.    Covid-19:Completed 2/28, 3/24, & 02/22/21  Advanced directives: Please bring a copy of your health care power of attorney and living will to the office at your convenience.  Conditions/risks identified: Keep healthy   Next appointment: Follow up in one year for your annual wellness visit    Preventive Care 34 Years and Older, Female Preventive care refers to lifestyle choices and visits with your health care provider that can promote health and wellness. What does preventive care include? A yearly physical exam. This is also called an annual well check. Dental exams once or twice a year. Routine eye exams. Ask your health care provider how often you should have your eyes checked. Personal lifestyle choices, including: Daily care of your teeth and gums. Regular physical activity. Eating a healthy diet. Avoiding tobacco and drug use. Limiting alcohol use. Practicing safe sex. Taking low-dose aspirin every day. Taking vitamin and mineral supplements as recommended by your health care provider. What happens during an annual well check? The services and screenings done by your health care provider during your annual well check will depend on your age, overall health, lifestyle risk factors, and family history of  disease. Counseling  Your health care provider may ask you questions about your: Alcohol use. Tobacco use. Drug use. Emotional well-being. Home and relationship well-being. Sexual activity. Eating habits. History of falls. Memory and ability to understand (cognition). Work and work Statistician. Reproductive health. Screening  You may have the following tests or measurements: Height, weight, and BMI. Blood pressure. Lipid and cholesterol levels. These may be checked every 5 years, or more frequently if you are over 46 years old. Skin check. Lung cancer screening. You may have this screening every year starting at age 68 if you have a 30-pack-year history of smoking and currently smoke or have quit within the past 15 years. Fecal occult blood test (FOBT) of the stool. You may have this test every year starting at age 17. Flexible sigmoidoscopy or colonoscopy. You may have a sigmoidoscopy every 5 years or a colonoscopy every 10 years starting at age 23. Hepatitis C blood test. Hepatitis B blood test. Sexually transmitted disease (STD) testing. Diabetes screening. This is done by checking your blood sugar (glucose) after you have not eaten for a while (fasting). You may have this done every 1-3 years. Bone density scan. This is done to screen for osteoporosis. You may have this done starting at age 60. Mammogram. This may be done every 1-2 years. Talk to your health care provider about how often you should have regular mammograms. Talk with your health care provider about your test results, treatment options, and if necessary, the need for more tests. Vaccines  Your health care provider may recommend certain vaccines, such as: Influenza vaccine. This is recommended every year. Tetanus, diphtheria, and acellular pertussis (Tdap, Td) vaccine. You may need a Td booster every 10 years. Zoster vaccine. You may need this  after age 69. Pneumococcal 13-valent conjugate (PCV13) vaccine. One  dose is recommended after age 55. Pneumococcal polysaccharide (PPSV23) vaccine. One dose is recommended after age 54. Talk to your health care provider about which screenings and vaccines you need and how often you need them. This information is not intended to replace advice given to you by your health care provider. Make sure you discuss any questions you have with your health care provider. Document Released: 11/25/2015 Document Revised: 07/18/2016 Document Reviewed: 08/30/2015 Elsevier Interactive Patient Education  2017 [place] Prevention in the Home Falls can cause injuries. They can happen to people of all ages. There are many things you can do to make your home safe and to help prevent falls. What can I do on the outside of my home? Regularly fix the edges of walkways and driveways and fix any cracks. Remove anything that might make you trip as you walk through a door, such as a raised step or threshold. Trim any bushes or trees on the path to your home. Use bright outdoor lighting. Clear any walking paths of anything that might make someone trip, such as rocks or tools. Regularly check to see if handrails are loose or broken. Make sure that both sides of any steps have handrails. Any raised decks and porches should have guardrails on the edges. Have any leaves, snow, or ice cleared regularly. Use sand or salt on walking paths during winter. Clean up any spills in your garage right away. This includes oil or grease spills. What can I do in the bathroom? Use night lights. Install grab bars by the toilet and in the tub and shower. Do not use towel bars as grab bars. Use non-skid mats or decals in the tub or shower. If you need to sit down in the shower, use a plastic, non-slip stool. Keep the floor dry. Clean up any water that spills on the floor as soon as it happens. Remove soap buildup in the tub or shower regularly. Attach bath mats securely with double-sided  non-slip rug tape. Do not have throw rugs and other things on the floor that can make you trip. What can I do in the bedroom? Use night lights. Make sure that you have a light by your bed that is easy to reach. Do not use any sheets or blankets that are too big for your bed. They should not hang down onto the floor. Have a firm chair that has side arms. You can use this for support while you get dressed. Do not have throw rugs and other things on the floor that can make you trip. What can I do in the kitchen? Clean up any spills right away. Avoid walking on wet floors. Keep items that you use a lot in easy-to-reach places. If you need to reach something above you, use a strong step stool that has a grab bar. Keep electrical cords out of the way. Do not use floor polish or wax that makes floors slippery. If you must use wax, use non-skid floor wax. Do not have throw rugs and other things on the floor that can make you trip. What can I do with my stairs? Do not leave any items on the stairs. Make sure that there are handrails on both sides of the stairs and use them. Fix handrails that are broken or loose. Make sure that handrails are as long as the stairways. Check any carpeting to make sure that it is firmly attached to the  stairs. Fix any carpet that is loose or worn. Avoid having throw rugs at the top or bottom of the stairs. If you do have throw rugs, attach them to the floor with carpet tape. Make sure that you have a light switch at the top of the stairs and the bottom of the stairs. If you do not have them, ask someone to add them for you. What else can I do to help prevent falls? Wear shoes that: Do not have high heels. Have rubber bottoms. Are comfortable and fit you well. Are closed at the toe. Do not wear sandals. If you use a stepladder: Make sure that it is fully opened. Do not climb a closed stepladder. Make sure that both sides of the stepladder are locked into place. Ask  someone to hold it for you, if possible. Clearly mark and make sure that you can see: Any grab bars or handrails. First and last steps. Where the edge of each step is. Use tools that help you move around (mobility aids) if they are needed. These include: Canes. Walkers. Scooters. Crutches. Turn on the lights when you go into a dark area. Replace any light bulbs as soon as they burn out. Set up your furniture so you have a clear path. Avoid moving your furniture around. If any of your floors are uneven, fix them. If there are any pets around you, be aware of where they are. Review your medicines with your doctor. Some medicines can make you feel dizzy. This can increase your chance of falling. Ask your doctor what other things that you can do to help prevent falls. This information is not intended to replace advice given to you by your health care provider. Make sure you discuss any questions you have with your health care provider. Document Released: 08/25/2009 Document Revised: 04/05/2016 Document Reviewed: 12/03/2014 Elsevier Interactive Patient Education  2017 [employer].

## 2021-07-12 NOTE — Progress Notes (Addendum)
Virtual Visit via Telephone Note  I connected with  Annette [name] on 07/12/21 at  2:30 PM EDT by telephone and verified that I am speaking with the correct person using two identifiers.  Medicare Annual Wellness visit completed telephonically due to Covid-19 pandemic.   Persons participating in this call: This Health Coach and this patient.   Location: Patient: ome Provider: Office   I discussed the limitations, risks, security and privacy concerns of performing an evaluation and management service by telephone and the availability of in person appointments. The patient expressed understanding and agreed to proceed.  Unable to perform video visit due to video visit attempted and failed and/or patient does not have video capability.   Some vital signs may be absent or patient reported.   [name], LPN   Subjective:   Annette [name] is a 87 y.o. female who presents for Medicare Annual (Subsequent) preventive examination.  Review of Systems     Cardiac Risk Factors include: advanced age (>11mn, >>61women);hypertension;obesity (BMI >30kg/m2)     Objective:    There were no vitals filed for this visit. There is no height or weight on file to calculate BMI.  Advanced Directives 07/12/2021 08/09/2020 11/13/2019 11/12/2019 08/14/2018 07/22/2018 11/11/2016  Does Patient Have a Medical Advance Directive? Yes Yes No No No No No  Type of AParamedicof ALassenLiving will - - - - -  Copy of [place]? No - copy requested No - copy requested - - - - -  Would patient like information on creating a medical advance directive? - - No - Patient declined No - Patient declined - No - Patient declined -    Current Medications (verified) Outpatient Encounter Medications as of 07/12/2021  Medication Sig   acetaminophen (TYLENOL) 500 MG tablet Take 500 mg by mouth every 6 (six) hours as needed (arthritis pain).    amLODipine (NORVASC) 5 MG tablet TAKE 1 TABLET EVERY DAY   apixaban (ELIQUIS) 5 MG TABS tablet Take 1 tablet (5 mg total) by mouth 2 (two) times daily.   cinacalcet (SENSIPAR) 30 MG tablet Take 1 tablet (30 mg total) by mouth daily.   furosemide (LASIX) 40 MG tablet TAKE 1 TABLET THREE TIMES WEEKLY   latanoprost (XALATAN) 0.005 % ophthalmic solution INSTILL 1 DROP INTO BOTH EYES AT BEDTIME (Patient taking differently: Place 1 drop into both eyes at bedtime.)   losartan (COZAAR) 25 MG tablet Take 1 tablet (25 mg total) by mouth daily.   omeprazole (PRILOSEC) 20 MG capsule TAKE 1 CAPSULE EVERY DAY   pravastatin (PRAVACHOL) 20 MG tablet Take 1 tablet (20 mg total) by mouth daily.   vitamin B-12 (CYANOCOBALAMIN) 1000 MCG tablet Take 1,000 mcg by mouth 2 (two) times a week.   VITAMIN D PO Take 1,000 Units by mouth daily.   apixaban (ELIQUIS) 2.5 MG TABS tablet Lot: [id number]: 11/23 (Patient not taking: Reported on 07/12/2021)   LORazepam (ATIVAN) 0.5 MG tablet Take 0.25-0.5 mg by mouth at bedtime. (Patient not taking: Reported on 07/12/2021)   Zinc 30 MG TABS Take by mouth. (Patient not taking: Reported on 07/12/2021)   No facility-administered encounter medications on file as of 07/12/2021.    Allergies (verified) Codeine   History: Past Medical History:  Diagnosis Date   GERD 10/03/2007   HYPERTENSION 10/03/2007   OSTEOPENIA 02/03/2008   Primary hyperparathyroidism ([facility]ar 10/03/2007   Past Surgical History:  Procedure Laterality  Date   ABDOMINAL HYSTERECTOMY     Family History  Problem Relation Age of Onset   Hypercalcemia Neg Hx    Social History   Socioeconomic History   Marital status: Widowed    Spouse name: Not on file   Number of children: Not on file   Years of education: Not on file   Highest education level: Not on file  Occupational History   Occupation: retired  Tobacco Use   Smoking status: Former    Types: Cigarettes    Quit date: 11/12/1978    Years since  quitting: 42.6   Smokeless tobacco: Never  Substance and Sexual Activity   Alcohol use: No   Drug use: No   Sexual activity: Not on file  Other Topics Concern   Not on file  Social History Narrative   Pt states her son and daughter in law live with her   Social Determinants of Health   Financial Resource Strain: Low Risk    Difficulty of Paying Living Expenses: Not hard at all  Food Insecurity: No Food Insecurity   Worried About Charity fundraiser in the Last Year: Never true   [place] in the Last Year: Never true  Transportation Needs: No Transportation Needs   Lack of Transportation (Medical): No   Lack of Transportation (Non-Medical): No  Physical Activity: Inactive   Days of Exercise per Week: 0 days   Minutes of Exercise per Session: 0 min  Stress: No Stress Concern Present   Feeling of Stress : Not at all  Social Connections: Moderately Isolated   Frequency of Communication with Friends and Family: More than three times a week   Frequency of Social Gatherings with Friends and Family: Never   Attends Religious Services: More than 4 times per year   Active Member of [employer] or Organizations: No   Attends Archivist Meetings: Never   Marital Status: Widowed    Tobacco Counseling Counseling given: Not Answered   Clinical Intake:  Pre-visit preparation completed: Yes  Pain : No/denies pain     Nutritional Status: BMI > 30  Obese Nutritional Risks: None Diabetes: No  How often do you need to have someone help you when you read instructions, pamphlets, or other written materials from your doctor or pharmacy?: 1 - Never  Diabetic?No  Interpreter Needed?: No  Information entered by :: Dayton Martes   Activities of Daily Living In your present state of health, do you have any difficulty performing the following activities: 07/12/2021 08/09/2020  Hearing? N Y  Comment - mild loss  Vision? N N  Difficulty concentrating or making  decisions? N N  Walking or climbing stairs? Y N  Dressing or bathing? N N  Doing errands, shopping? N N  Preparing Food and eating ? N N  Using the Toilet? N N  In the past six months, have you accidently leaked urine? N N  Do you have problems with loss of bowel control? N N  Managing your Medications? N N  Managing your Finances? N N  Housekeeping or managing your Housekeeping? N N  Some recent data might be hidden    Patient Care Team: Martinique, [name], MD as PCP - General (Family Medicine) Viona Gilmore, [facility] as Pharmacist (Pharmacist)  Indicate any recent Medical Services you may have received from other than [facility] providers in the past year (date may be approximate).     Assessment:   This is a routine  wellness examination for [place].  Hearing/Vision screen Hearing Screening - Comments:: Pt denies any hearing issues  Vision Screening - Comments:: Pt follows up with Dr Brigitte Pulse for annual eye exams   Dietary issues and exercise activities discussed: Current Exercise Habits: The patient does not participate in regular exercise at present (does housework), Type of exercise: Other - see comments (house work)   Goals Addressed             This Visit's Progress    Patient Stated       Keep healthy       Depression Screen PHQ 2/9 Scores 07/12/2021 08/31/2020 08/09/2020 07/17/2019 08/22/2018 05/23/2016 07/05/2015  PHQ - 2 Score 0 0 0 0 0 0 0    Fall Risk Fall Risk  07/12/2021 08/31/2020 08/09/2020 07/17/2019 08/22/2018  Falls in the past year? 0 1 0 0 No  Number falls in past yr: 0 0 0 0 -  Injury with Fall? 0 0 0 0 -  Risk for fall due to : Impaired vision;Other (Comment) Impaired balance/gait;Orthopedic patient Impaired balance/gait;Impaired mobility Orthopedic patient;Impaired balance/gait -  Risk for fall due to: Comment use of a walker - - - -  Follow up Falls prevention discussed Education provided Falls prevention discussed - -    FALL RISK PREVENTION PERTAINING TO  THE HOME:  Any stairs in or around the home? Yes  If so, are there any without handrails? No  Home free of loose throw rugs in walkways, pet beds, electrical cords, etc? Yes  Adequate lighting in your home to reduce risk of falls? Yes   ASSISTIVE DEVICES UTILIZED TO PREVENT FALLS:  Life alert? Yes a smart phone with son Use of a cane, walker or w/c? Yes  Grab bars in the bathroom? Yes  Shower chair or bench in shower? Yes  Elevated toilet seat or a handicapped toilet? Yes   TIMED UP AND GO:  Was the test performed? No .   Cognitive Function:   Montreal Cognitive Assessment  07/03/2020  Visuospatial/ Executive (0/5) 5  Naming (0/3) 3  Attention: Read list of digits (0/2) 2  Attention: Read list of letters (0/1) 1  Attention: Serial 7 subtraction starting at 100 (0/3) 2  Language: Repeat phrase (0/2) 2  Language : Fluency (0/1) 1  Abstraction (0/2) 2  Delayed Recall (0/5) 5  Orientation (0/6) 6  Total 29   6CIT Screen 07/12/2021 08/09/2020  What Year? 0 points (No Data)  What month? 0 points -  What time? 0 points -  Count back from 20 0 points -  Months in reverse 0 points -  Repeat phrase 0 points -  Total Score 0 -    Immunizations Immunization History  Administered Date(s) Administered   Fluad Quad(high Dose 65+) 07/15/2019, 08/31/2020   Influenza Split 08/22/2011   Influenza Whole 08/10/2010   Influenza, High Dose Seasonal PF 08/04/2014, 08/05/2016, 08/04/2017, 07/24/2018   Influenza,inj,Quad PF,6+ Mos 08/10/2013   Influenza-Unspecified 07/27/2017   PFIZER(Purple Top)SARS-COV-2 Vaccination 01/10/2020, 02/03/2020, 02/22/2021   Pneumococcal Conjugate-13 08/10/2014   Pneumococcal Polysaccharide-23 08/10/2013   Tdap 08/31/2020      Flu Vaccine status: Due, Education has been provided regarding the importance of this vaccine. Advised may receive this vaccine at local pharmacy or Health Dept. Aware to provide a copy of the vaccination record if obtained from  local pharmacy or Health Dept. Verbalized acceptance and understanding.  Pneumococcal vaccine status: Due, Education has been provided regarding the importance of this vaccine. Advised may  receive this vaccine at local pharmacy or Health Dept. Aware to provide a copy of the vaccination record if obtained from local pharmacy or Health Dept. Verbalized acceptance and understanding.  Covid-19 vaccine status: Completed vaccines  Qualifies for Shingles Vaccine? Yes   Zostavax completed No   Shingrix Completed?: No.    Education has been provided regarding the importance of this vaccine. Patient has been advised to call insurance company to determine out of pocket expense if they have not yet received this vaccine. Advised may also receive vaccine at local pharmacy or Health Dept. Verbalized acceptance and understanding.  Screening Tests Health Maintenance  Topic Date Due   Zoster Vaccines- Shingrix (1 of 2) Never done   COVID-19 Vaccine (4 - Booster for Pfizer series) 06/24/2021   INFLUENZA VACCINE  06/12/2021   PNA vac Low Risk Adult  Completed   HPV VACCINES  Aged Out   DEXA SCAN  Discontinued   TETANUS/TDAP  Discontinued    Health Maintenance  Health Maintenance Due  Topic Date Due   Zoster Vaccines- Shingrix (1 of 2) Never done   COVID-19 Vaccine (4 - Booster for Pfizer series) 06/24/2021   INFLUENZA VACCINE  06/12/2021    Colorectal cancer screening: No longer required.   Mammogram status: No longer required due to age.    Additional Screening:   Vision Screening: Recommended annual ophthalmology exams for early detection of glaucoma and other disorders of the eye. Is the patient up to date with their annual eye exam?  Yes  Who is the provider or what is the name of the office in which the patient attends annual eye exams? Dr Brigitte Pulse If pt is not established with a provider, would they like to be referred to a provider to establish care? No .   Dental Screening: Recommended  annual dental exams for proper oral hygiene  Community Resource Referral / Chronic Care Management: CRR required this visit?  No   CCM required this visit?  No      Plan:     I have personally reviewed and noted the following in the patient's chart:   Medical and social history Use of alcohol, tobacco or illicit drugs  Current medications and supplements including opioid prescriptions.  Functional ability and status Nutritional status Physical activity Advanced directives List of other physicians Hospitalizations, surgeries, and ER visits in previous 12 months Vitals Screenings to include cognitive, depression, and falls Referrals and appointments  In addition, I have reviewed and discussed with patient certain preventive protocols, quality metrics, and best practice recommendations. A written personalized care plan for preventive services as well as general preventive health recommendations were provided to patient.     [name], LPN   [id number]   Nurse Notes: none

## 2021-09-06 ENCOUNTER — Other Ambulatory Visit: Payer: Self-pay | Admitting: Family Medicine

## 2021-09-13 ENCOUNTER — Telehealth: Payer: Self-pay | Admitting: Cardiology

## 2021-09-13 NOTE — Telephone Encounter (Signed)
Patient's needs form for Annette [name] for [employer].  She states it's time to send her the forms.

## 2021-09-13 NOTE — Telephone Encounter (Signed)
Returned call to pt, she would like forms mailed as she is homebound.  Forms mailed

## 2021-09-21 DIAGNOSIS — R6889 Other general symptoms and signs: Secondary | ICD-10-CM | POA: Diagnosis not present

## 2021-09-26 ENCOUNTER — Telehealth: Payer: Self-pay | Admitting: Family Medicine

## 2021-09-26 NOTE — Telephone Encounter (Signed)
Pt is calling to let dr Martinique know she did get a flu shot on 09-21-2021 at [facility] [address]

## 2021-09-26 NOTE — Telephone Encounter (Signed)
Chart updated

## 2021-10-09 ENCOUNTER — Ambulatory Visit (INDEPENDENT_AMBULATORY_CARE_PROVIDER_SITE_OTHER): Payer: Medicare HMO | Admitting: Endocrinology

## 2021-10-09 ENCOUNTER — Other Ambulatory Visit: Payer: Self-pay

## 2021-10-09 VITALS — BP 140/100 | HR 75 | Ht 63.0 in | Wt 151.0 lb

## 2021-10-09 DIAGNOSIS — R6889 Other general symptoms and signs: Secondary | ICD-10-CM | POA: Diagnosis not present

## 2021-10-09 DIAGNOSIS — E210 Primary hyperparathyroidism: Secondary | ICD-10-CM | POA: Diagnosis not present

## 2021-10-09 DIAGNOSIS — E559 Vitamin D deficiency, unspecified: Secondary | ICD-10-CM

## 2021-10-09 LAB — VITAMIN D 25 HYDROXY (VIT D DEFICIENCY, FRACTURES): VITD: 53.79 ng/mL (ref 30.00–100.00)

## 2021-10-09 NOTE — Patient Instructions (Addendum)
Blood tests are requested for you today.  We'll let you know about the results.    Please come back for a follow-up appointment in 4 months.   

## 2021-10-09 NOTE — Progress Notes (Signed)
Subjective:    Patient ID: Annette [name], female    DOB: 07-Feb-1930, 87 y.o.   MRN: [id number]  HPI Pt returns for f/u of primary hyperparathyroidism (dx'ed 2008; she has never had osteoporosis or urolithiasis; only bony fracture was right wrist (1988, with an injury)). Due to multiple med probs and advanced age; she is not a surgical candidate; she takes cinacalcet and Vit-D, 1000 units/d; DEXA was normal in 2012).  she brings a record of her BP and HR.  It varies widely (84-124/60-77).   Past Medical History:  Diagnosis Date   GERD 10/03/2007   HYPERTENSION 10/03/2007   OSTEOPENIA 02/03/2008   Primary hyperparathyroidism ([place]) 10/03/2007    Past Surgical History:  Procedure Laterality Date   ABDOMINAL HYSTERECTOMY      Social History   Socioeconomic History   Marital status: Widowed    Spouse name: Not on file   Number of children: Not on file   Years of education: Not on file   Highest education level: Not on file  Occupational History   Occupation: retired  Tobacco Use   Smoking status: Former    Types: Cigarettes    Quit date: 11/12/1978    Years since quitting: 42.9   Smokeless tobacco: Never  Substance and Sexual Activity   Alcohol use: No   Drug use: No   Sexual activity: Not on file  Other Topics Concern   Not on file  Social History Narrative   Pt states her son and daughter in law live with her   Social Determinants of Health   Financial Resource Strain: Low Risk    Difficulty of Paying Living Expenses: Not hard at all  Food Insecurity: No Food Insecurity   Worried About Charity fundraiser in the Last Year: Never true   [place] in the Last Year: Never true  Transportation Needs: No Transportation Needs   Lack of Transportation (Medical): No   Lack of Transportation (Non-Medical): No  Physical Activity: Inactive   Days of Exercise per Week: 0 days   Minutes of Exercise per Session: 0 min  Stress: No Stress Concern Present   Feeling of  Stress : Not at all  Social Connections: Moderately Isolated   Frequency of Communication with Friends and Family: More than three times a week   Frequency of Social Gatherings with Friends and Family: Never   Attends Religious Services: More than 4 times per year   Active Member of [employer] or Organizations: No   Attends Archivist Meetings: Never   Marital Status: Widowed  Human resources officer Violence: Not At Risk   Fear of Current or Ex-Partner: No   Emotionally Abused: No   Physically Abused: No   Sexually Abused: No    Current Outpatient Medications on File Prior to Visit  Medication Sig Dispense Refill   acetaminophen (TYLENOL) 500 MG tablet Take 500 mg by mouth every 6 (six) hours as needed (arthritis pain).     amLODipine (NORVASC) 5 MG tablet TAKE 1 TABLET EVERY DAY 90 tablet 3   apixaban (ELIQUIS) 2.5 MG TABS tablet Lot: [id number] Exp: 11/23 60 tablet 0   apixaban (ELIQUIS) 5 MG TABS tablet Take 1 tablet (5 mg total) by mouth 2 (two) times daily. 60 tablet 0   cinacalcet (SENSIPAR) 30 MG tablet Take 1 tablet (30 mg total) by mouth daily. 90 tablet 3   furosemide (LASIX) 40 MG tablet TAKE 1 TABLET THREE TIMES WEEKLY 39 tablet 3  latanoprost (XALATAN) 0.005 % ophthalmic solution INSTILL 1 DROP INTO BOTH EYES AT BEDTIME (Patient taking differently: Place 1 drop into both eyes at bedtime.) 7.5 mL 3   LORazepam (ATIVAN) 0.5 MG tablet Take 0.25-0.5 mg by mouth at bedtime.     losartan (COZAAR) 25 MG tablet Take 1 tablet (25 mg total) by mouth daily. 90 tablet 1   omeprazole (PRILOSEC) 20 MG capsule TAKE 1 CAPSULE EVERY DAY 90 capsule 3   pravastatin (PRAVACHOL) 20 MG tablet TAKE 1 TABLET EVERY DAY 90 tablet 2   vitamin B-12 (CYANOCOBALAMIN) 1000 MCG tablet Take 1,000 mcg by mouth 2 (two) times a week.     VITAMIN D PO Take 1,000 Units by mouth daily.     Zinc 30 MG TABS Take by mouth.     No current facility-administered medications on file prior to visit.    Allergies   Allergen Reactions   Codeine Nausea And Vomiting    Family History  Problem Relation Age of Onset   Hypercalcemia Neg Hx     BP (!) 140/100   Pulse 75   Ht 5\' 3"  (1.6 m)   Wt 151 lb (68.5 kg)   SpO2 98%   BMI 26.75 kg/m    Review of Systems Denies falls    Objective:   Physical Exam GAIT: steady with a walker.   Lab Results  Component Value Date   PTH 119 (H) 10/09/2021   CALCIUM 10.8 (H) 10/09/2021       Assessment & Plan:  Primary hyperparathyroidism: Ca++ is well-controlled on cinacalcet Vit-D def: recheck today

## 2021-10-10 LAB — PTH, INTACT AND CALCIUM
Calcium: 10.8 mg/dL — ABNORMAL HIGH (ref 8.6–10.4)
PTH: 119 pg/mL — ABNORMAL HIGH (ref 16–77)

## 2021-10-13 ENCOUNTER — Other Ambulatory Visit: Payer: Self-pay | Admitting: Family Medicine

## 2021-10-13 DIAGNOSIS — I10 Essential (primary) hypertension: Secondary | ICD-10-CM

## 2021-11-08 ENCOUNTER — Other Ambulatory Visit: Payer: Self-pay | Admitting: Family Medicine

## 2021-11-17 ENCOUNTER — Telehealth: Payer: Self-pay | Admitting: Family Medicine

## 2021-11-17 ENCOUNTER — Telehealth: Payer: Self-pay | Admitting: Cardiology

## 2021-11-17 MED ORDER — OSELTAMIVIR PHOSPHATE 75 MG PO CAPS: 75.0000 mg | ORAL_CAPSULE | Freq: Every day | ORAL | 0 refills | Status: DC

## 2021-11-17 NOTE — Telephone Encounter (Signed)
Tamiflu 75 mg daily x 10 days to start within 48 hours after exposure. Thanks, BJ

## 2021-11-17 NOTE — Telephone Encounter (Signed)
New Message:    Patient is calling to ask that Dr Gardiner Rhyme would let Annette [name] Patient's Assistance Program know the milligram and dose of her Eliquis. They said this was information was left off of her application on the part that that the doctor had to complete. Please fax this asap to   [phone]. Phone number is [phone]. Application number is [id number]

## 2021-11-17 NOTE — Telephone Encounter (Signed)
Patient's daughter called because her and husband have tested positive for influenza type A and because they were around patient, she has begun coughing. ED doctors recommended that Tamiflu be sent in as a preventative for patient.   Please send to   [facility] [address] [place], [place] [address]     Please call Elta Guadeloupe once prescription is sent so they will know to go pick up    Elta Guadeloupe callback number is [phone]   Please advise

## 2021-11-17 NOTE — Telephone Encounter (Signed)
Provider portion of application has been filled out faxed to patient assistance company.   Medication list updated.

## 2021-11-17 NOTE — Telephone Encounter (Signed)
Rx sent in. I left Elta Guadeloupe a voicemail letting him know.

## 2021-11-17 NOTE — Telephone Encounter (Addendum)
Returned call to patient who states she sent in patient portion of form. Patient needs provider portion with prescription faxed to company. Patient currently has 5mg  and 2.5mg  Eliquis listed on chart- per patient she is taking 5mg . Will forward to MD to confirm dosage before faxing forms to patient assistance. Patient states she has 2 weeks of Eliquis left, advised patient to please let us know if she needs samples as it is important for her not run out. Patient verbalized understanding. Will forward to MD/PharmD for advise. Patient aware forms will be faxed upon completion.

## 2021-11-17 NOTE — Telephone Encounter (Signed)
Yes her dose is 5mg  BID Thanks Gerald Stabs

## 2021-11-19 ENCOUNTER — Other Ambulatory Visit: Payer: Self-pay | Admitting: Endocrinology

## 2021-11-20 ENCOUNTER — Ambulatory Visit: Payer: Medicare HMO | Admitting: Family Medicine

## 2021-11-21 NOTE — Telephone Encounter (Signed)
Patient has been approved for eliquis patient assistance from 11/17/21 to 11/11/22.

## 2021-12-04 NOTE — Progress Notes (Signed)
HPI: [name] is a 88 y.o. female, who is here today for follow up. She was last seen on 05/19/21. Since her last visit she has seen cardiologist for atrial fib and endocrinologist for hypercalcemia. Primary hyperparathyroidism, she is on Sensipar 30 mg.  HTN: Losartan is causing her skin to peel. She has been taking it for 6 months. She is also on Amlodipine 5 mg daily.  Lisinopril discontinued because cough. Negative for associated SOB or wheezing. Home BP's 120's/60-70's. Some SBP in the low 100's, 90's x 1. Occasional SBP's 130-140's.  Concerned about episodes of palpitations, heart "racing." In general HR has been 80-90's. Atrial fib on Eliquis 5 mg bid. HFpEF, echo 06/2021 LVEF 55-60% She is on Furosemide 40 mg 3 times per week.  Negative for acute visual changes,CP,focal weakness,worsening edema,orthopnea,or PND.  Aortic atherosclerosis seen on imaging, chest CTA in 10/2019. She is on Pravastatin 20 mg daily. Lab Results  Component Value Date   CHOL 184 05/19/2021   HDL 63.10 05/19/2021   LDLCALC 105 (H) 05/19/2021   LDLDIRECT 121.2 06/17/2012   TRIG 81.0 05/19/2021   CHOLHDL 3 05/19/2021    Glaucoma and right macular degeneration, follows with ophthalmologist regularly.  Lab Results  Component Value Date   CREATININE 0.94 03/24/2021   BUN 13 03/24/2021   NA 137 03/24/2021   K 4.7 03/24/2021   CL 98 03/24/2021   CO2 22 03/24/2021   Anxiety: She has taken 1 tab of Lorazepam since 12/2020. Denies depressed mood.  Today she would like to have a DNR form completed. She has discussed this with her son. She is still trying to follow a healthful diet. She does not exercise regularly but stays active around her house with some chores.  Review of Systems  Constitutional:  Positive for fatigue. Negative for activity change, appetite change and fever.  HENT:  Negative for mouth sores, nosebleeds and trouble swallowing.   Gastrointestinal:  Negative for  abdominal pain, nausea and vomiting.       Negative for changes in bowel habits.  Genitourinary:  Negative for decreased urine volume, dysuria and hematuria.  Musculoskeletal:  Positive for arthralgias and gait problem.  Neurological:  Negative for syncope and headaches.  Psychiatric/Behavioral:  Negative for confusion and hallucinations.   Rest see pertinent positives and negatives per HPI.  Current Outpatient Medications on File Prior to Visit  Medication Sig Dispense Refill   acetaminophen (TYLENOL) 500 MG tablet Take 500 mg by mouth every 6 (six) hours as needed (arthritis pain).     amLODipine (NORVASC) 5 MG tablet TAKE 1 TABLET EVERY DAY 90 tablet 3   apixaban (ELIQUIS) 5 MG TABS tablet Take 1 tablet (5 mg total) by mouth 2 (two) times daily. 60 tablet 0   cinacalcet (SENSIPAR) 30 MG tablet TAKE 1 TABLET EVERY DAY 90 tablet 3   furosemide (LASIX) 40 MG tablet TAKE 1 TABLET THREE TIMES WEEKLY 39 tablet 3   latanoprost (XALATAN) 0.005 % ophthalmic solution INSTILL 1 DROP INTO BOTH EYES AT BEDTIME (Patient taking differently: Place 1 drop into both eyes at bedtime.) 7.5 mL 3   LORazepam (ATIVAN) 0.5 MG tablet Take 0.25-0.5 mg by mouth at bedtime.     omeprazole (PRILOSEC) 20 MG capsule TAKE 1 CAPSULE EVERY DAY 90 capsule 3   pravastatin (PRAVACHOL) 20 MG tablet TAKE 1 TABLET EVERY DAY 90 tablet 2   vitamin B-12 (CYANOCOBALAMIN) 1000 MCG tablet Take 1,000 mcg by mouth 2 (two) times a week.  VITAMIN D PO Take 1,000 Units by mouth daily.     Zinc 30 MG TABS Take by mouth.     No current facility-administered medications on file prior to visit.    Past Medical History:  Diagnosis Date   GERD 10/03/2007   HYPERTENSION 10/03/2007   OSTEOPENIA 02/03/2008   Primary hyperparathyroidism ([place]) 10/03/2007   Allergies  Allergen Reactions   Codeine Nausea And Vomiting    Social History   Socioeconomic History   Marital status: Widowed    Spouse name: Not on file   Number of  children: Not on file   Years of education: Not on file   Highest education level: Not on file  Occupational History   Occupation: retired  Tobacco Use   Smoking status: Former    Types: Cigarettes    Quit date: 11/12/1978    Years since quitting: 43.1   Smokeless tobacco: Never  Substance and Sexual Activity   Alcohol use: No   Drug use: No   Sexual activity: Not on file  Other Topics Concern   Not on file  Social History Narrative   Pt states her son and daughter in law live with her   Social Determinants of Health   Financial Resource Strain: Low Risk    Difficulty of Paying Living Expenses: Not hard at all  Food Insecurity: No Food Insecurity   Worried About Charity fundraiser in the Last Year: Never true   [place] in the Last Year: Never true  Transportation Needs: No Transportation Needs   Lack of Transportation (Medical): No   Lack of Transportation (Non-Medical): No  Physical Activity: Inactive   Days of Exercise per Week: 0 days   Minutes of Exercise per Session: 0 min  Stress: No Stress Concern Present   Feeling of Stress : Not at all  Social Connections: Moderately Isolated   Frequency of Communication with Friends and Family: More than three times a week   Frequency of Social Gatherings with Friends and Family: Never   Attends Religious Services: More than 4 times per year   Active Member of [employer] or Organizations: No   Attends Archivist Meetings: Never   Marital Status: Widowed   Vitals:   12/05/21 1039  BP: 134/80  Pulse: 98  Resp: 16  SpO2: 91%   Body mass index is 26.97 kg/m.  Physical Exam Vitals and nursing note reviewed.  Constitutional:      General: She is not in acute distress.    Appearance: She is well-developed.  HENT:     Head: Normocephalic and atraumatic.     Mouth/Throat:     Mouth: Mucous membranes are moist.     Dentition: Abnormal dentition.     Pharynx: Oropharynx is clear.  Eyes:      Conjunctiva/sclera: Conjunctivae normal.     Pupils:     Right eye: Pupil is not round.  Cardiovascular:     Rate and Rhythm: Normal rate. Rhythm regularly irregular.     Pulses:          Dorsalis pedis pulses are 2+ on the right side and 2+ on the left side.     Heart sounds: No murmur heard.    Comments: Varicose veins LE, bilateral. Pulmonary:     Effort: Pulmonary effort is normal. No respiratory distress.     Breath sounds: Normal breath sounds.  Abdominal:     Palpations: Abdomen is soft.     Tenderness:  There is no abdominal tenderness.  Musculoskeletal:     Right lower leg: 1+ Pitting Edema present.     Left lower leg: 1+ Pitting Edema present.  Lymphadenopathy:     Cervical: No cervical adenopathy.  Skin:    General: Skin is warm.     Findings: No erythema or rash.  Neurological:     General: No focal deficit present.     Mental Status: She is alert and oriented to person, place, and time.     Cranial Nerves: No cranial nerve deficit.     Gait: Gait normal.  Psychiatric:     Comments: Well groomed, good eye contact.   ASSESSMENT AND PLAN:  [name] was seen today for follow-up.  Diagnoses and all orders for this visit: Orders Placed This Encounter  Procedures   Comprehensive metabolic panel   Lab Results  Component Value Date   CREATININE 1.09 12/05/2021   BUN 15 12/05/2021   NA 137 12/05/2021   K 4.6 12/05/2021   CL 99 12/05/2021   CO2 30 12/05/2021   Lab Results  Component Value Date   ALT 13 12/05/2021   AST 21 12/05/2021   ALKPHOS 87 12/05/2021   BILITOT 1.1 12/05/2021   Atrial fibrillation (HCC) Most HR at home adequate but reporting episodes of "racing heart." Today Metoprolol 25 mg 0.5 tab bid added. Continue Eliquis 5 mg bid. Some side effects discussed. Continue monitoring HR. She has an appt with her cardiologist 12/2021.  Essential hypertension Losartan 25 mg discontinued because she thinks it is causing dry skin. Some SBP's at  home are on lower normal range. Continue Amlodipine 5 mg at bedtime. Metoprolol 12.5 mg bid added today. Continue low salt diet and monitoring BP regularly.  Chronic diastolic heart failure (HCC) Continue Furosemide 40 mg 3 times per week and low salt diet. Following with cardiologist.  Atherosclerosis of aorta [facility]) Seen on chest CTA in 10/2019. Continue Pravastatin 20 mg daily.  Primary hyperparathyroidism [facility]) Following with endocrinologist.  Status code: DNR, form signed today.  I spent a total of 45 minutes in both face to face and non face to face activities for this visit on the date of this encounter. During this time history was obtained and documented, examination was performed, prior labs/imaging reviewed, and assessment/plan discussed.  Return in about 6 months (around 06/04/2022).  Annette Daily G. Martinique, MD  [facility]. [place] office.

## 2021-12-05 ENCOUNTER — Ambulatory Visit (INDEPENDENT_AMBULATORY_CARE_PROVIDER_SITE_OTHER): Payer: Medicare HMO | Admitting: Family Medicine

## 2021-12-05 ENCOUNTER — Encounter: Payer: Self-pay | Admitting: Family Medicine

## 2021-12-05 VITALS — BP 134/80 | HR 98 | Resp 16 | Ht 63.0 in | Wt 152.2 lb

## 2021-12-05 DIAGNOSIS — I700 Atherosclerosis of aorta: Secondary | ICD-10-CM | POA: Diagnosis not present

## 2021-12-05 DIAGNOSIS — I5032 Chronic diastolic (congestive) heart failure: Secondary | ICD-10-CM

## 2021-12-05 DIAGNOSIS — I10 Essential (primary) hypertension: Secondary | ICD-10-CM | POA: Diagnosis not present

## 2021-12-05 DIAGNOSIS — R6889 Other general symptoms and signs: Secondary | ICD-10-CM | POA: Diagnosis not present

## 2021-12-05 DIAGNOSIS — I4891 Unspecified atrial fibrillation: Secondary | ICD-10-CM

## 2021-12-05 DIAGNOSIS — E210 Primary hyperparathyroidism: Secondary | ICD-10-CM

## 2021-12-05 LAB — COMPREHENSIVE METABOLIC PANEL
ALT: 13 U/L (ref 0–35)
AST: 21 U/L (ref 0–37)
Albumin: 4.7 g/dL (ref 3.5–5.2)
Alkaline Phosphatase: 87 U/L (ref 39–117)
BUN: 15 mg/dL (ref 6–23)
CO2: 30 mEq/L (ref 19–32)
Calcium: 10.6 mg/dL — ABNORMAL HIGH (ref 8.4–10.5)
Chloride: 99 mEq/L (ref 96–112)
Creatinine, Ser: 1.09 mg/dL (ref 0.40–1.20)
GFR: 45.17 mL/min — ABNORMAL LOW (ref >60.00)
Glucose, Bld: 86 mg/dL (ref 70–99)
Potassium: 4.6 mEq/L (ref 3.5–5.1)
Sodium: 137 mEq/L (ref 135–145)
Total Bilirubin: 1.1 mg/dL (ref 0.2–1.2)
Total Protein: 7.6 g/dL (ref 6.0–8.3)

## 2021-12-05 MED ORDER — METOPROLOL TARTRATE 25 MG PO TABS: 12.5000 mg | ORAL_TABLET | Freq: Two times a day (BID) | ORAL | 1 refills | Status: DC

## 2021-12-05 NOTE — Assessment & Plan Note (Signed)
Most HR at home adequate but reporting episodes of "racing heart." Today Metoprolol 25 mg 0.5 tab bid added. Continue Eliquis 5 mg bid. Some side effects discussed. Continue monitoring HR. She has an appt with her cardiologist 12/2021.

## 2021-12-05 NOTE — Patient Instructions (Addendum)
A few things to remember from today's visit:   Chronic diastolic heart failure ([place])  Atherosclerosis of aorta ([place]), Chronic  Atrial fibrillation, unspecified type ([place]), Chronic - Plan: metoprolol tartrate (LOPRESSOR) 25 MG tablet  Primary hyperparathyroidism ([place]), Chronic  Essential hypertension - Plan: Comprehensive metabolic panel  If you need refills please call your pharmacy. Do not use My Chart to request refills or for acute issues that need immediate attention.   Plan: Stop Losartan. Continue Amlodipine 5 mg daily. Today we added Metoprolol 25 mg 1/2 tab 2 times daily. Keep next appt with cardiologist. Continue monitoring blood pressure and pulse.  Please be sure medication list is accurate. If a new problem present, please set up appointment sooner than planned today.

## 2021-12-05 NOTE — Assessment & Plan Note (Signed)
Losartan 25 mg discontinued because she thinks it is causing dry skin. Some SBP's at home are on lower normal range. Continue Amlodipine 5 mg at bedtime. Metoprolol 12.5 mg bid added today. Continue low salt diet and monitoring BP regularly.

## 2021-12-07 ENCOUNTER — Ambulatory Visit: Payer: Medicare HMO | Admitting: Endocrinology

## 2021-12-07 ENCOUNTER — Encounter: Payer: Self-pay | Admitting: Family Medicine

## 2021-12-09 ENCOUNTER — Encounter: Payer: Self-pay | Admitting: Family Medicine

## 2021-12-11 NOTE — Telephone Encounter (Signed)
Viewed recommendations via result note comments.

## 2021-12-14 ENCOUNTER — Other Ambulatory Visit: Payer: Self-pay | Admitting: Family Medicine

## 2021-12-19 ENCOUNTER — Emergency Department (HOSPITAL_COMMUNITY): Payer: Medicare HMO

## 2021-12-19 ENCOUNTER — Inpatient Hospital Stay (HOSPITAL_COMMUNITY)
Admission: EM | Admit: 2021-12-19 | Discharge: 2021-12-21 | DRG: 291 | Disposition: A | Discharge status: Home Health | Payer: Medicare HMO | Attending: Internal Medicine | Admitting: Internal Medicine

## 2021-12-19 DIAGNOSIS — T501X5A Adverse effect of loop [high-ceiling] diuretics, initial encounter: Secondary | ICD-10-CM | POA: Diagnosis not present

## 2021-12-19 DIAGNOSIS — E785 Hyperlipidemia, unspecified: Secondary | ICD-10-CM | POA: Diagnosis present

## 2021-12-19 DIAGNOSIS — J449 Chronic obstructive pulmonary disease, unspecified: Secondary | ICD-10-CM | POA: Diagnosis not present

## 2021-12-19 DIAGNOSIS — Z20822 Contact with and (suspected) exposure to covid-19: Secondary | ICD-10-CM | POA: Diagnosis not present

## 2021-12-19 DIAGNOSIS — Z66 Do not resuscitate: Secondary | ICD-10-CM | POA: Diagnosis present

## 2021-12-19 DIAGNOSIS — J9601 Acute respiratory failure with hypoxia: Secondary | ICD-10-CM | POA: Diagnosis not present

## 2021-12-19 DIAGNOSIS — J439 Emphysema, unspecified: Secondary | ICD-10-CM | POA: Diagnosis not present

## 2021-12-19 DIAGNOSIS — Q2112 Patent foramen ovale: Secondary | ICD-10-CM

## 2021-12-19 DIAGNOSIS — E210 Primary hyperparathyroidism: Secondary | ICD-10-CM | POA: Diagnosis present

## 2021-12-19 DIAGNOSIS — R918 Other nonspecific abnormal finding of lung field: Secondary | ICD-10-CM | POA: Diagnosis not present

## 2021-12-19 DIAGNOSIS — Z7901 Long term (current) use of anticoagulants: Secondary | ICD-10-CM | POA: Diagnosis not present

## 2021-12-19 DIAGNOSIS — J9811 Atelectasis: Secondary | ICD-10-CM | POA: Diagnosis present

## 2021-12-19 DIAGNOSIS — R062 Wheezing: Secondary | ICD-10-CM | POA: Diagnosis not present

## 2021-12-19 DIAGNOSIS — I2782 Chronic pulmonary embolism: Secondary | ICD-10-CM | POA: Diagnosis present

## 2021-12-19 DIAGNOSIS — Z87891 Personal history of nicotine dependence: Secondary | ICD-10-CM | POA: Diagnosis not present

## 2021-12-19 DIAGNOSIS — R0902 Hypoxemia: Secondary | ICD-10-CM

## 2021-12-19 DIAGNOSIS — I110 Hypertensive heart disease with heart failure: Principal | ICD-10-CM | POA: Diagnosis present

## 2021-12-19 DIAGNOSIS — I4821 Permanent atrial fibrillation: Secondary | ICD-10-CM | POA: Diagnosis present

## 2021-12-19 DIAGNOSIS — I10 Essential (primary) hypertension: Secondary | ICD-10-CM | POA: Diagnosis not present

## 2021-12-19 DIAGNOSIS — I5033 Acute on chronic diastolic (congestive) heart failure: Secondary | ICD-10-CM | POA: Diagnosis not present

## 2021-12-19 DIAGNOSIS — R059 Cough, unspecified: Secondary | ICD-10-CM | POA: Diagnosis not present

## 2021-12-19 DIAGNOSIS — J90 Pleural effusion, not elsewhere classified: Secondary | ICD-10-CM | POA: Diagnosis not present

## 2021-12-19 DIAGNOSIS — R911 Solitary pulmonary nodule: Secondary | ICD-10-CM | POA: Diagnosis not present

## 2021-12-19 DIAGNOSIS — I509 Heart failure, unspecified: Secondary | ICD-10-CM | POA: Diagnosis present

## 2021-12-19 DIAGNOSIS — J9611 Chronic respiratory failure with hypoxia: Secondary | ICD-10-CM | POA: Diagnosis present

## 2021-12-19 DIAGNOSIS — N179 Acute kidney failure, unspecified: Secondary | ICD-10-CM | POA: Diagnosis not present

## 2021-12-19 DIAGNOSIS — Z8673 Personal history of transient ischemic attack (TIA), and cerebral infarction without residual deficits: Secondary | ICD-10-CM

## 2021-12-19 DIAGNOSIS — I493 Ventricular premature depolarization: Secondary | ICD-10-CM | POA: Diagnosis present

## 2021-12-19 DIAGNOSIS — I5032 Chronic diastolic (congestive) heart failure: Secondary | ICD-10-CM | POA: Diagnosis present

## 2021-12-19 DIAGNOSIS — E876 Hypokalemia: Secondary | ICD-10-CM | POA: Diagnosis present

## 2021-12-19 DIAGNOSIS — I4819 Other persistent atrial fibrillation: Secondary | ICD-10-CM | POA: Diagnosis present

## 2021-12-19 DIAGNOSIS — R0602 Shortness of breath: Secondary | ICD-10-CM | POA: Diagnosis not present

## 2021-12-19 LAB — CBC WITH DIFFERENTIAL/PLATELET
Abs Immature Granulocytes: 0.03 10*3/uL (ref 0.00–0.07)
Basophils Absolute: 0 10*3/uL (ref 0.0–0.1)
Basophils Relative: 0 %
Eosinophils Absolute: 0.1 10*3/uL (ref 0.0–0.5)
Eosinophils Relative: 1 %
HCT: 44.7 % (ref 36.0–46.0)
Hemoglobin: 14.9 g/dL (ref 12.0–15.0)
Immature Granulocytes: 0 %
Lymphocytes Relative: 17 %
Lymphs Abs: 1.3 10*3/uL (ref 0.7–4.0)
MCH: 31.2 pg (ref 26.0–34.0)
MCHC: 33.3 g/dL (ref 30.0–36.0)
MCV: 93.7 fL (ref 80.0–100.0)
Monocytes Absolute: 0.9 10*3/uL (ref 0.1–1.0)
Monocytes Relative: 12 %
Neutro Abs: 5.1 10*3/uL (ref 1.7–7.7)
Neutrophils Relative %: 70 %
Platelets: 213 10*3/uL (ref 150–400)
RBC: 4.77 MIL/uL (ref 3.87–5.11)
RDW: 13.6 % (ref 11.5–15.5)
WBC: 7.3 10*3/uL (ref 4.0–10.5)
nRBC: 0 % (ref 0.0–0.2)

## 2021-12-19 LAB — BASIC METABOLIC PANEL
Anion gap: 12 (ref 5–15)
BUN: 14 mg/dL (ref 8–23)
CO2: 24 mmol/L (ref 22–32)
Calcium: 9.7 mg/dL (ref 8.9–10.3)
Chloride: 102 mmol/L (ref 98–111)
Creatinine, Ser: 1.07 mg/dL — ABNORMAL HIGH (ref 0.44–1.00)
GFR, Estimated: 50 mL/min — ABNORMAL LOW (ref >60)
Glucose, Bld: 106 mg/dL — ABNORMAL HIGH (ref 70–99)
Potassium: 4 mmol/L (ref 3.5–5.1)
Sodium: 138 mmol/L (ref 135–145)

## 2021-12-19 LAB — RESP PANEL BY RT-PCR (FLU A&B, COVID) ARPGX2
Influenza A by PCR: NEGATIVE
Influenza B by PCR: NEGATIVE
SARS Coronavirus 2 by RT PCR: NEGATIVE

## 2021-12-19 MED ORDER — ALBUTEROL SULFATE HFA 108 (90 BASE) MCG/ACT IN AERS
2.0000 | INHALATION_SPRAY | RESPIRATORY_TRACT | Status: DC | PRN
  Filled 2021-12-19: qty 6.7

## 2021-12-19 MED ORDER — FUROSEMIDE 10 MG/ML IJ SOLN
80.0000 mg | Freq: Once | INTRAMUSCULAR | Status: AC
  Administered 2021-12-19: 80 mg via INTRAVENOUS
  Filled 2021-12-19: qty 8

## 2021-12-19 NOTE — ED Triage Notes (Addendum)
EMS stated, coming from home with SOB 3 days ago. Denies any other symptoms. 22 g in rt. Forearm.

## 2021-12-19 NOTE — Telephone Encounter (Signed)
If O2 is < 90% and she is wheezing she needs to be taken to the ER. Please remind her son that in the future do not use Mychart message for serious issues like this one, she should have been evaluated as soon as she started with respiratory symptoms and low O2. Thanks, BJ

## 2021-12-19 NOTE — ED Notes (Signed)
Patient transported to CT 

## 2021-12-19 NOTE — ED Provider Triage Note (Signed)
Emergency Medicine Provider Triage Evaluation Note  Annette [name] , a 88 y.o. female  was evaluated in triage.  Pt complains of shortness of breath and cough for 1 week.  Patient with history of A-fib.  She does not chronically wear oxygen at home.  Review of Systems  Positive: Nasal congestion, shortness of breath, cough Negative: Chest pain, abdominal pain, fever  Physical Exam  BP (!) 151/92 (BP Location: Left Arm)    Pulse 96    Temp 97.6 F (36.4 C) (Oral)    Resp 20    SpO2 92%  Gen:   Awake, no distress   Resp:  Normal effort  MSK:   Moves extremities without difficulty  Other:    Medical Decision Making  Medically screening exam initiated at 10:02 AM.  Appropriate orders placed.  SAVI LASTINGER was informed that the remainder of the evaluation will be completed by another provider, this initial triage assessment does not replace that evaluation, and the importance of remaining in the ED until their evaluation is complete.  Patient on 2 L nasal cannula while in triage   Kateri Plummer, PA-C 12/19/21 1009

## 2021-12-19 NOTE — ED Provider Notes (Signed)
[facility] EMERGENCY DEPARTMENT Provider Note   CSN: [id number] Arrival date & time: 12/19/21  3094     History  Chief Complaint  Patient presents with   Shortness of Breath    Annette [name] is a 88 y.o. female.  88 yo F w/ h/o HTN, Afib and HFpEF who presents to the ED today with sob. It is unclear exactly how long this has been happening but it sounds like at least the last few days.  She took what sounds and a pulse ox reading and it was either 89 or 90 and her doctor said to come to the ER to be evaluated.  She is recently taken off of one of her blood pressure medications she is not sure which and started on metoprolol which she has not been totally compliant with.  No recent fevers or coughing.  She does notice swelling to her lower extremities.  She states that she feels like she is wheezing and gasping for air.  No orthopnea.  No chest pain.  She is on Eliquis for the A-fib and has been compliant with that.   Shortness of Breath     Home Medications Prior to Admission medications   Medication Sig Start Date End Date Taking? Authorizing Provider  acetaminophen (TYLENOL) 500 MG tablet Take 500 mg by mouth 3 (three) times daily.   Yes [provider]  amLODipine (NORVASC) 5 MG tablet TAKE 1 TABLET EVERY DAY Patient taking differently: Take 5 mg by mouth at bedtime. 12/15/21  Yes Martinique, [name], MD  apixaban (ELIQUIS) 5 MG TABS tablet Take 1 tablet (5 mg total) by mouth 2 (two) times daily. 03/24/21  Yes [name], MD  cinacalcet (SENSIPAR) 30 MG tablet TAKE 1 TABLET EVERY DAY Patient taking differently: Take 30 mg by mouth. 11/20/21  Yes Renato Shin, MD  furosemide (LASIX) 40 MG tablet TAKE 1 TABLET THREE TIMES WEEKLY Patient taking differently: Take 40 mg by mouth every Monday, Wednesday, and Friday. 06/19/21  Yes Martinique, [name], MD  latanoprost (XALATAN) 0.005 % ophthalmic solution INSTILL 1 DROP INTO BOTH EYES AT BEDTIME Patient  taking differently: Place 1 drop into both eyes at bedtime. 10/29/17  Yes Marletta Lor, MD  omeprazole (PRILOSEC) 20 MG capsule TAKE 1 CAPSULE EVERY DAY Patient taking differently: Take 20 mg by mouth daily. 11/08/21  Yes Martinique, [name], MD  pravastatin (PRAVACHOL) 20 MG tablet TAKE 1 TABLET EVERY DAY Patient taking differently: Take 20 mg by mouth at bedtime. 09/06/21  Yes Martinique, [name], MD  vitamin B-12 (CYANOCOBALAMIN) 1000 MCG tablet Take 1,000 mcg by mouth 2 (two) times a week. Tuesday and thursday   Yes [provider]  VITAMIN D PO Take 1,000 Units by mouth at bedtime.   Yes [provider]  metoprolol tartrate (LOPRESSOR) 25 MG tablet Take 0.5 tablets (12.5 mg total) by mouth 2 (two) times daily. Patient not taking: Reported on 12/20/2021 12/05/21   Martinique, [name], MD      Allergies    Codeine    Review of Systems   Review of Systems  Respiratory:  Positive for shortness of breath.    Physical Exam Updated Vital Signs BP 136/73    Pulse 87    Temp 97.9 F (36.6 C) (Oral)    Resp 20    SpO2 94%  Physical Exam Vitals and nursing note reviewed.  Constitutional:      Appearance: She is well-developed.  HENT:  Head: Normocephalic and atraumatic.  Cardiovascular:     Rate and Rhythm: Normal rate and regular rhythm.  Pulmonary:     Effort: Tachypnea present. No respiratory distress.     Breath sounds: No stridor. Examination of the right-lower field reveals decreased breath sounds. Decreased breath sounds present. No wheezing or rhonchi.  Chest:     Chest wall: No mass or tenderness.  Abdominal:     General: There is no distension.  Musculoskeletal:        General: Normal range of motion.     Cervical back: Normal range of motion.  Skin:    General: Skin is warm and dry.  Neurological:     General: No focal deficit present.     Mental Status: She is alert.    ED Results / Procedures / Treatments   Labs (all labs ordered are listed, but  only abnormal results are displayed) Labs Reviewed  BASIC METABOLIC PANEL - Abnormal; Notable for the following components:      Result Value   Glucose, Bld 106 (*)    Creatinine, Ser 1.07 (*)    GFR, Estimated 50 (*)    All other components within normal limits  BRAIN NATRIURETIC PEPTIDE - Abnormal; Notable for the following components:   B Natriuretic Peptide 255.2 (*)    All other components within normal limits  RESP PANEL BY RT-PCR (FLU A&B, COVID) ARPGX2  CBC WITH DIFFERENTIAL/PLATELET  TROPONIN I (HIGH SENSITIVITY)    EKG None  Radiology DG Chest 2 View  Result Date: 12/19/2021 CLINICAL DATA:  Shortness of breath, cough. EXAM: CHEST - 2 VIEW COMPARISON:  November 12, 2019. FINDINGS: Stable cardiomediastinal silhouette. Mild bibasilar subsegmental atelectasis or scarring is noted. Probable small right pleural effusion is noted. Bony thorax is unremarkable. IMPRESSION: Mild bibasilar subsegmental atelectasis or scarring. Probable small right pleural effusion. Aortic Atherosclerosis (ICD10-I70.0). Electronically Signed   By: Marijo Conception M.D.   On: 12/19/2021 10:33   CT Chest W Contrast  Result Date: 12/20/2021 CLINICAL DATA:  Chronic dyspnea of unclear etiology. Shortness of breath and cough for 1 week. EXAM: CT CHEST WITH CONTRAST TECHNIQUE: Multidetector CT imaging of the chest was performed during intravenous contrast administration. RADIATION DOSE REDUCTION: This exam was performed according to the departmental dose-optimization program which includes automated exposure control, adjustment of the mA and/or kV according to patient size and/or use of iterative reconstruction technique. CONTRAST:  45mL OMNIPAQUE IOHEXOL 300 MG/ML  SOLN COMPARISON:  CT 11/12/2019.  Chest radiograph 12/19/2021 FINDINGS: Cardiovascular: Cardiac enlargement. No pericardial effusions. Normal caliber thoracic aorta. No aortic dissection. Aortic calcifications. Great vessel origins are patent. Persistent  eccentric thrombus in right lower lobe subsegmental pulmonary artery. This represents residual chronic pulmonary embolus seen on prior study. Other emboli have resolved. Mediastinum/Nodes: Esophagus is decompressed. No significant lymphadenopathy. Thyroid gland is unremarkable. Lungs/Pleura: Emphysematous changes throughout the lungs. Areas of linear scarring mostly in the left mid lung. New small right pleural effusion with basilar atelectasis or infiltration. This is possibly pneumonia. Right lower lobe pulmonary nodule measuring 5 mm diameter. No change since prior study. Additional smaller pulmonary nodules are also present without change. Upper Abdomen: No acute abnormalities demonstrated in the visualized upper abdomen. Musculoskeletal: Degenerative changes in the spine. IMPRESSION: 1. Small chronic pulmonary embolus demonstrated in the right lower lung this corresponds to acute pulmonary embolus seen on the previous study. 2. New right pleural effusion with basilar atelectasis or consolidation. 3. Emphysematous changes throughout the lungs. 4. Pulmonary nodules  are unchanged since prior study. Stability for greater than 2 years favors benign etiology. 5. Cardiac enlargement. Electronically Signed   By: Lucienne Capers M.D.   On: 12/20/2021 00:12    Procedures .Critical Care Performed by: Merrily Pew, MD Authorized by: Merrily Pew, MD   Critical care provider statement:    Critical care time (minutes):  32   Critical care time was exclusive of:  Separately billable procedures and treating other patients and teaching time   Critical care was necessary to treat or prevent imminent or life-threatening deterioration of the following conditions:  Respiratory failure   Critical care was time spent personally by me on the following activities:  Development of treatment plan with patient or surrogate, evaluation of patient's response to treatment, examination of patient, obtaining history from patient  or surrogate, review of old charts, re-evaluation of patient's condition, pulse oximetry, ordering and performing treatments and interventions and ordering and review of laboratory studies    Medications Ordered in ED Medications  albuterol (VENTOLIN HFA) 108 (90 Base) MCG/ACT inhaler 2 puff (has no administration in time range)  furosemide (LASIX) injection 40 mg (has no administration in time range)  amLODipine (NORVASC) tablet 5 mg (has no administration in time range)  metoprolol tartrate (LOPRESSOR) tablet 12.5 mg (has no administration in time range)  pravastatin (PRAVACHOL) tablet 20 mg (has no administration in time range)  cinacalcet (SENSIPAR) tablet 30 mg (has no administration in time range)  pantoprazole (PROTONIX) EC tablet 40 mg (has no administration in time range)  apixaban (ELIQUIS) tablet 5 mg (has no administration in time range)  latanoprost (XALATAN) 0.005 % ophthalmic solution 1 drop (has no administration in time range)  acetaminophen (TYLENOL) tablet 650 mg (has no administration in time range)    Or  acetaminophen (TYLENOL) suppository 650 mg (has no administration in time range)  furosemide (LASIX) injection 80 mg (80 mg Intravenous Given 12/19/21 2326)  iohexol (OMNIPAQUE) 300 MG/ML solution 75 mL (75 mLs Intravenous Contrast Given 12/20/21 0001)    ED Course/ Medical Decision Making/ A&P                           Medical Decision Making Amount and/or Complexity of Data Reviewed Labs: ordered. Radiology: ordered.  Risk Prescription drug management. Decision regarding hospitalization.  Overall patient's appearance and story seem to be most consistent with likely acute on chronic heart failure exacerbation.  We will add on a troponin and BNP to her labs.  We will get CT scan just to verify the presence of what looks like interstitial edema to me on the chest x-ray (was not called by radiology).  We will start Lasix as well.  She is on couplers of oxygen from  triage.  She has been here for 13 hours so not sure what her oxygen was when she got here or if she even needs it at this point. We will try to wean it off once we start diuresing her.  Patient diuresed nearly 3 hours. Still hypoxic with talking. Wants to go home but she is hypoxic to 84%, I don't feel it's safe so I discussed with Dr. Marlowe Sax for admission for continued diuresis.   Final Clinical Impression(s) / ED Diagnoses Final diagnoses:  Hypoxia  Acute respiratory failure with hypoxia [facility])    Rx / DC Orders ED Discharge Orders     None         Lashan Gluth, Corene Cornea, MD 12/20/21 [phone]

## 2021-12-19 NOTE — ED Notes (Signed)
Oxygen tank changed at 2L 

## 2021-12-20 ENCOUNTER — Other Ambulatory Visit: Payer: Self-pay

## 2021-12-20 DIAGNOSIS — J449 Chronic obstructive pulmonary disease, unspecified: Secondary | ICD-10-CM | POA: Diagnosis present

## 2021-12-20 DIAGNOSIS — E210 Primary hyperparathyroidism: Secondary | ICD-10-CM | POA: Diagnosis present

## 2021-12-20 DIAGNOSIS — J9611 Chronic respiratory failure with hypoxia: Secondary | ICD-10-CM | POA: Diagnosis present

## 2021-12-20 DIAGNOSIS — E785 Hyperlipidemia, unspecified: Secondary | ICD-10-CM | POA: Diagnosis present

## 2021-12-20 DIAGNOSIS — Z87891 Personal history of nicotine dependence: Secondary | ICD-10-CM | POA: Diagnosis not present

## 2021-12-20 DIAGNOSIS — I5033 Acute on chronic diastolic (congestive) heart failure: Secondary | ICD-10-CM | POA: Diagnosis present

## 2021-12-20 DIAGNOSIS — I509 Heart failure, unspecified: Secondary | ICD-10-CM

## 2021-12-20 DIAGNOSIS — J9811 Atelectasis: Secondary | ICD-10-CM | POA: Diagnosis present

## 2021-12-20 DIAGNOSIS — E876 Hypokalemia: Secondary | ICD-10-CM | POA: Diagnosis not present

## 2021-12-20 DIAGNOSIS — Z8673 Personal history of transient ischemic attack (TIA), and cerebral infarction without residual deficits: Secondary | ICD-10-CM | POA: Diagnosis not present

## 2021-12-20 DIAGNOSIS — I2782 Chronic pulmonary embolism: Secondary | ICD-10-CM | POA: Diagnosis present

## 2021-12-20 DIAGNOSIS — I110 Hypertensive heart disease with heart failure: Secondary | ICD-10-CM | POA: Diagnosis present

## 2021-12-20 DIAGNOSIS — T501X5A Adverse effect of loop [high-ceiling] diuretics, initial encounter: Secondary | ICD-10-CM | POA: Diagnosis not present

## 2021-12-20 DIAGNOSIS — I4821 Permanent atrial fibrillation: Secondary | ICD-10-CM | POA: Diagnosis present

## 2021-12-20 DIAGNOSIS — J9601 Acute respiratory failure with hypoxia: Secondary | ICD-10-CM

## 2021-12-20 DIAGNOSIS — Q2112 Patent foramen ovale: Secondary | ICD-10-CM | POA: Diagnosis not present

## 2021-12-20 DIAGNOSIS — I5032 Chronic diastolic (congestive) heart failure: Secondary | ICD-10-CM | POA: Diagnosis present

## 2021-12-20 DIAGNOSIS — Z20822 Contact with and (suspected) exposure to covid-19: Secondary | ICD-10-CM | POA: Diagnosis present

## 2021-12-20 DIAGNOSIS — Z66 Do not resuscitate: Secondary | ICD-10-CM | POA: Diagnosis present

## 2021-12-20 DIAGNOSIS — Z7901 Long term (current) use of anticoagulants: Secondary | ICD-10-CM | POA: Diagnosis not present

## 2021-12-20 DIAGNOSIS — N179 Acute kidney failure, unspecified: Secondary | ICD-10-CM | POA: Diagnosis not present

## 2021-12-20 DIAGNOSIS — I493 Ventricular premature depolarization: Secondary | ICD-10-CM | POA: Diagnosis present

## 2021-12-20 LAB — BASIC METABOLIC PANEL
Anion gap: 11 (ref 5–15)
BUN: 12 mg/dL (ref 8–23)
CO2: 29 mmol/L (ref 22–32)
Calcium: 9.7 mg/dL (ref 8.9–10.3)
Chloride: 101 mmol/L (ref 98–111)
Creatinine, Ser: 0.98 mg/dL (ref 0.44–1.00)
GFR, Estimated: 55 mL/min — ABNORMAL LOW (ref >60)
Glucose, Bld: 133 mg/dL — ABNORMAL HIGH (ref 70–99)
Potassium: 3.3 mmol/L — ABNORMAL LOW (ref 3.5–5.1)
Sodium: 141 mmol/L (ref 135–145)

## 2021-12-20 LAB — TROPONIN I (HIGH SENSITIVITY): Troponin I (High Sensitivity): 15 ng/L (ref <18)

## 2021-12-20 LAB — BRAIN NATRIURETIC PEPTIDE: B Natriuretic Peptide: 255.2 pg/mL — ABNORMAL HIGH (ref 0.0–100.0)

## 2021-12-20 MED ORDER — ALBUTEROL SULFATE (2.5 MG/3ML) 0.083% IN NEBU: 2.5000 mg | INHALATION_SOLUTION | RESPIRATORY_TRACT | Status: DC | PRN

## 2021-12-20 MED ORDER — POTASSIUM CHLORIDE CRYS ER 20 MEQ PO TBCR
40.0000 meq | EXTENDED_RELEASE_TABLET | Freq: Once | ORAL | Status: AC
  Administered 2021-12-20: 40 meq via ORAL
  Filled 2021-12-20: qty 2

## 2021-12-20 MED ORDER — APIXABAN 5 MG PO TABS
5.0000 mg | ORAL_TABLET | Freq: Two times a day (BID) | ORAL | Status: DC
  Administered 2021-12-20 – 2021-12-21 (×3): 5 mg via ORAL
  Filled 2021-12-20 (×3): qty 1

## 2021-12-20 MED ORDER — IOHEXOL 300 MG/ML  SOLN
75.0000 mL | Freq: Once | INTRAMUSCULAR | Status: AC | PRN
  Administered 2021-12-20: 75 mL via INTRAVENOUS

## 2021-12-20 MED ORDER — METOPROLOL TARTRATE 25 MG PO TABS: 12.5000 mg | ORAL_TABLET | Freq: Two times a day (BID) | ORAL | Status: DC

## 2021-12-20 MED ORDER — METOPROLOL TARTRATE 12.5 MG HALF TABLET
12.5000 mg | ORAL_TABLET | Freq: Two times a day (BID) | ORAL | Status: DC
  Administered 2021-12-20 – 2021-12-21 (×3): 12.5 mg via ORAL
  Filled 2021-12-20 (×3): qty 1

## 2021-12-20 MED ORDER — LATANOPROST 0.005 % OP SOLN
1.0000 [drp] | Freq: Every day | OPHTHALMIC | Status: DC
  Administered 2021-12-20: 1 [drp] via OPHTHALMIC
  Filled 2021-12-20: qty 2.5

## 2021-12-20 MED ORDER — FUROSEMIDE 10 MG/ML IJ SOLN
40.0000 mg | Freq: Two times a day (BID) | INTRAMUSCULAR | Status: DC
  Administered 2021-12-20: 40 mg via INTRAVENOUS
  Filled 2021-12-20: qty 4

## 2021-12-20 MED ORDER — AMLODIPINE BESYLATE 5 MG PO TABS
5.0000 mg | ORAL_TABLET | Freq: Every day | ORAL | Status: DC
  Administered 2021-12-20: 5 mg via ORAL
  Filled 2021-12-20: qty 1

## 2021-12-20 MED ORDER — ACETAMINOPHEN 650 MG RE SUPP: 650.0000 mg | Freq: Four times a day (QID) | RECTAL | Status: DC | PRN

## 2021-12-20 MED ORDER — PRAVASTATIN SODIUM 10 MG PO TABS
20.0000 mg | ORAL_TABLET | Freq: Every day | ORAL | Status: DC
  Administered 2021-12-20: 20 mg via ORAL
  Filled 2021-12-20: qty 2

## 2021-12-20 MED ORDER — POTASSIUM CHLORIDE CRYS ER 20 MEQ PO TBCR
40.0000 meq | EXTENDED_RELEASE_TABLET | Freq: Once | ORAL | Status: AC
Start: 2021-12-20 — End: 2021-12-20
  Administered 2021-12-20: 40 meq via ORAL
  Filled 2021-12-20: qty 2

## 2021-12-20 MED ORDER — FUROSEMIDE 10 MG/ML IJ SOLN: 40.0000 mg | Freq: Every day | INTRAMUSCULAR | Status: DC

## 2021-12-20 MED ORDER — PANTOPRAZOLE SODIUM 40 MG PO TBEC
40.0000 mg | DELAYED_RELEASE_TABLET | Freq: Every day | ORAL | Status: DC
  Administered 2021-12-20 – 2021-12-21 (×2): 40 mg via ORAL
  Filled 2021-12-20 (×2): qty 1

## 2021-12-20 MED ORDER — ACETAMINOPHEN 325 MG PO TABS
650.0000 mg | ORAL_TABLET | Freq: Four times a day (QID) | ORAL | Status: DC | PRN
  Administered 2021-12-21: 650 mg via ORAL
  Filled 2021-12-20: qty 2

## 2021-12-20 MED ORDER — CINACALCET HCL 30 MG PO TABS
30.0000 mg | ORAL_TABLET | Freq: Every day | ORAL | Status: DC
  Administered 2021-12-20 – 2021-12-21 (×2): 30 mg via ORAL
  Filled 2021-12-20 (×2): qty 1

## 2021-12-20 NOTE — Assessment & Plan Note (Signed)
Stable.  Continue metoprolol and amlodipine. 

## 2021-12-20 NOTE — Assessment & Plan Note (Signed)
-   Continue Eliquis 

## 2021-12-20 NOTE — Assessment & Plan Note (Signed)
-  Continue Sensipar 

## 2021-12-20 NOTE — Assessment & Plan Note (Addendum)
Acute on chronic diastolic heart failure COPD Although chest x-ray and CT chest do not comment regarding pulmonary edema, clinically volume overloaded. BNP 255.   Echo done in June 2022 showing EF 55 to 60%, mild mitral regurgitation.   She takes Lasix 40 mg 3 times a week as prescribed, ?dietary compliance.  CT showing new right pleural effusion with basilar atelectasis or consolidation.  Also has emphysematous changes throughout the lungs on CT and reports history of cigarette smoking several decades ago.  COVID and flu negative.   Oxygen saturation 84% on room air on admission.  Not on home oxygen. Patient was given IV Lasix at PCPs office (unknown dose) and additional IV Lasix 80 mg in the ED.   Patient was continued on IV Lasix 40 mg daily in the hospital. She is -2.97 L since admission and almost clinically euvolemic. Due to an uptick in her creatinine, IV Lasix was stopped this morning. Cardiology follow-up appreciated.  I discussed with Dr. Katharina Caper.  He feels that her decompensation was due to eating extra salt and bacon and sausage.  Patient has been counseled regarding low-salt diet.  He recommends continuing prior home dose of Lasix 40 mg 3 times per week and she has been counseled to take an additional 40 mg for volume excess as noted in discharge instructions. Outpatient follow-up with primary cardiologist and PCP Patient qualifies for home oxygen 2 L/min continuously and has been arranged.  As per extensive discussion with patient's daughter-in-law on day of discharge, patient lives alone, does not allow her family to monitor or assist her with her medications etc., feels that she is not fully compliant with her meds, at times has seen meds under her pillow, indulges in salty food. Discussed with TOC team and arranged for home health PT and also RN and aide for medication and disease management.

## 2021-12-20 NOTE — ED Notes (Signed)
Breakfast Orders Placed °

## 2021-12-20 NOTE — Assessment & Plan Note (Signed)
   Management as noted above. 

## 2021-12-20 NOTE — ED Notes (Signed)
MD advised RN to wean pt off of oxygen. Pt O2 dropped to 84% RA. RN put pt back on 2L . O2 96%.

## 2021-12-20 NOTE — H&P (Signed)
History and Physical    Annette [name] ZOX:[id number] DOB: [birth year] DOA: 12/19/2021  PCP: [place], [name], MD  Patient coming from: Home  HPI: Annette [name] is a 88 y.o. female with medical history significant of persistent A-fib on Eliquis, history of PE in December 8119, chronic diastolic heart failure, hypertension, GERD, primary hyperparathyroidism.  Seen by PCP on 1/24 for complaints of "racing heart" and was started on metoprolol 12.5 mg twice daily.  Losartan was discontinued due to concern for dry skin.  DNR form was signed during this visit.  Patient presents to the ED with complaints of shortness of breath, cough, and bilateral lower extremity edema.  In A-fib with RVR, rate up to 110s. Oxygen saturation 84% on room air, improved with 2 L supplemental oxygen.  Not febrile.  COVID and flu negative.  Labs showing no leukocytosis.  High-sensitivity troponin negative.  BNP 255.  CT chest with contrast showing small chronic PE in the right lower lung, new right pleural effusion with basilar atelectasis or consolidation, emphysematous changes throughout the lungs, and pulmonary nodules appear unchanged since prior study. Patient was given IV Lasix 80 mg.  Patient reports 2-week history of progressively worsening dyspnea, cough, bilateral lower extremity edema, orthopnea, and wheezing.  Denies chest pain.  States she went to her PCP yesterday and was told her oxygen saturation was low at 89-90%.  She does not use home oxygen.  Patient states she was given a dose of IV Lasix and sent to the ED for further evaluation.  She takes Lasix 40 mg 3 times a week at home as prescribed and has not missed any doses.  She watches her dietary sodium intake except used canned soup the other day for a recipe.  Drinks coffee and juice in the morning and about 4 glasses of water throughout the day.  Patient states she used to smoke cigarettes in the past but was never a heavy smoker, quit several decades  ago.  Review of Systems:  All systems reviewed and apart from history of presenting illness, are negative.  Past Medical History:  Diagnosis Date   GERD 10/03/2007   HYPERTENSION 10/03/2007   OSTEOPENIA 02/03/2008   Primary hyperparathyroidism ([place]) 10/03/2007    Past Surgical History:  Procedure Laterality Date   ABDOMINAL HYSTERECTOMY       reports that she quit smoking about 43 years ago. Her smoking use included cigarettes. She has never used smokeless tobacco. She reports that she does not drink alcohol and does not use drugs.  Allergies  Allergen Reactions   Codeine Nausea And Vomiting    Family History  Problem Relation Age of Onset   Hypercalcemia Neg Hx     Prior to Admission medications   Medication Sig Start Date End Date Taking? Authorizing Provider  acetaminophen (TYLENOL) 500 MG tablet Take 500 mg by mouth 3 (three) times daily.   Yes [provider]  amLODipine (NORVASC) 5 MG tablet TAKE 1 TABLET EVERY DAY Patient taking differently: Take 5 mg by mouth at bedtime. 12/15/21  Yes [place]ique, [name], MD  apixaban (ELIQUIS) 5 MG TABS tablet Take 1 tablet (5 mg total) by mouth 2 (two) times daily. 03/24/21  Yes [name], MD  cinacalcet (SENSIPAR) 30 MG tablet TAKE 1 TABLET EVERY DAY Patient taking differently: Take 30 mg by mouth. 11/20/21  Yes Renato Shin, MD  furosemide (LASIX) 40 MG tablet TAKE 1 TABLET THREE TIMES WEEKLY Patient taking differently: Take 40 mg by mouth  every Monday, Wednesday, and Friday. 06/19/21  Yes [place], [name], MD  latanoprost (XALATAN) 0.005 % ophthalmic solution INSTILL 1 DROP INTO BOTH EYES AT BEDTIME Patient taking differently: Place 1 drop into both eyes at bedtime. 10/29/17  Yes Marletta Lor, MD  omeprazole (PRILOSEC) 20 MG capsule TAKE 1 CAPSULE EVERY DAY Patient taking differently: Take 20 mg by mouth daily. 11/08/21  Yes [place], [name], MD  pravastatin (PRAVACHOL) 20 MG tablet TAKE 1 TABLET EVERY  DAY Patient taking differently: Take 20 mg by mouth at bedtime. 09/06/21  Yes [place], [name], MD  vitamin B-12 (CYANOCOBALAMIN) 1000 MCG tablet Take 1,000 mcg by mouth 2 (two) times a week. Tuesday and thursday   Yes [provider]  VITAMIN D PO Take 1,000 Units by mouth at bedtime.   Yes [provider]  metoprolol tartrate (LOPRESSOR) 25 MG tablet Take 0.5 tablets (12.5 mg total) by mouth 2 (two) times daily. Patient not taking: Reported on 12/20/2021 12/05/21   [place], [name], MD    Physical Exam: Vitals:   12/20/21 0200 12/20/21 0230 12/20/21 0330 12/20/21 0345  BP: 129/83 (!) 115/96 (!) 144/90 136/73  Pulse: 97 91 95 87  Resp: 15 (!) 27 20 20   Temp:      TempSrc:      SpO2: 94% (!) 84% 98% 94%    Physical Exam Constitutional:      General: She is not in acute distress. HENT:     Head: Normocephalic and atraumatic.  Eyes:     Extraocular Movements: Extraocular movements intact.     Conjunctiva/sclera: Conjunctivae normal.  Neck:     Comments: +JVD Cardiovascular:     Rate and Rhythm: Tachycardia present. Rhythm irregular.     Pulses: Normal pulses.  Pulmonary:     Effort: Pulmonary effort is normal. No respiratory distress.     Comments: Very mild scattered wheezing Diminished breath sounds at the right lung base Abdominal:     General: Bowel sounds are normal.     Palpations: Abdomen is soft.     Tenderness: There is no abdominal tenderness.  Musculoskeletal:     Cervical back: Normal range of motion and neck supple.     Right lower leg: Edema present.     Left lower leg: Edema present.     Comments: +4 pitting edema of bilateral lower legs  Skin:    General: Skin is warm and dry.  Neurological:     General: No focal deficit present.     Mental Status: She is alert and oriented to person, place, and time.     Labs on Admission: I have personally reviewed following labs and imaging studies  CBC: Recent Labs  Lab 12/19/21 1013  WBC 7.3   NEUTROABS 5.1  HGB 14.9  HCT 44.7  MCV 93.7  PLT 765   Basic Metabolic Panel: Recent Labs  Lab 12/19/21 1013  NA 138  K 4.0  CL 102  CO2 24  GLUCOSE 106*  BUN 14  CREATININE 1.07*  CALCIUM 9.7   GFR: CrCl cannot be calculated (Unknown ideal weight.). Liver Function Tests: No results for input(s): AST, ALT, ALKPHOS, BILITOT, PROT, ALBUMIN in the last 168 hours. No results for input(s): LIPASE, AMYLASE in the last 168 hours. No results for input(s): AMMONIA in the last 168 hours. Coagulation Profile: No results for input(s): INR, PROTIME in the last 168 hours. Cardiac Enzymes: No results for input(s): CKTOTAL, CKMB, CKMBINDEX, TROPONINI in the last 168  hours. BNP (last 3 results) No results for input(s): PROBNP in the last 8760 hours. HbA1C: No results for input(s): HGBA1C in the last 72 hours. CBG: No results for input(s): GLUCAP in the last 168 hours. Lipid Profile: No results for input(s): CHOL, HDL, LDLCALC, TRIG, CHOLHDL, LDLDIRECT in the last 72 hours. Thyroid Function Tests: No results for input(s): TSH, T4TOTAL, FREET4, T3FREE, THYROIDAB in the last 72 hours. Anemia Panel: No results for input(s): VITAMINB12, FOLATE, FERRITIN, TIBC, IRON, RETICCTPCT in the last 72 hours. Urine analysis:    Component Value Date/Time   COLORURINE YELLOW 08/14/2018 1723   APPEARANCEUR CLEAR 08/14/2018 1723   LABSPEC 1.013 08/14/2018 1723   PHURINE 5.0 08/14/2018 1723   GLUCOSEU NEGATIVE 08/14/2018 1723   HGBUR NEGATIVE 08/14/2018 1723   BILIRUBINUR NEGATIVE 08/14/2018 1723   KETONESUR NEGATIVE 08/14/2018 1723   PROTEINUR NEGATIVE 08/14/2018 1723   NITRITE NEGATIVE 08/14/2018 1723   LEUKOCYTESUR NEGATIVE 08/14/2018 1723    Radiological Exams on Admission: I have personally reviewed images DG Chest 2 View  Result Date: 12/19/2021 CLINICAL DATA:  Shortness of breath, cough. EXAM: CHEST - 2 VIEW COMPARISON:  November 12, 2019. FINDINGS: Stable cardiomediastinal silhouette.  Mild bibasilar subsegmental atelectasis or scarring is noted. Probable small right pleural effusion is noted. Bony thorax is unremarkable. IMPRESSION: Mild bibasilar subsegmental atelectasis or scarring. Probable small right pleural effusion. Aortic Atherosclerosis (ICD10-I70.0). Electronically Signed   By: Marijo Conception M.D.   On: 12/19/2021 10:33   CT Chest W Contrast  Result Date: 12/20/2021 CLINICAL DATA:  Chronic dyspnea of unclear etiology. Shortness of breath and cough for 1 week. EXAM: CT CHEST WITH CONTRAST TECHNIQUE: Multidetector CT imaging of the chest was performed during intravenous contrast administration. RADIATION DOSE REDUCTION: This exam was performed according to the departmental dose-optimization program which includes automated exposure control, adjustment of the mA and/or kV according to patient size and/or use of iterative reconstruction technique. CONTRAST:  67mL OMNIPAQUE IOHEXOL 300 MG/ML  SOLN COMPARISON:  CT 11/12/2019.  Chest radiograph 12/19/2021 FINDINGS: Cardiovascular: Cardiac enlargement. No pericardial effusions. Normal caliber thoracic aorta. No aortic dissection. Aortic calcifications. Great vessel origins are patent. Persistent eccentric thrombus in right lower lobe subsegmental pulmonary artery. This represents residual chronic pulmonary embolus seen on prior study. Other emboli have resolved. Mediastinum/Nodes: Esophagus is decompressed. No significant lymphadenopathy. Thyroid gland is unremarkable. Lungs/Pleura: Emphysematous changes throughout the lungs. Areas of linear scarring mostly in the left mid lung. New small right pleural effusion with basilar atelectasis or infiltration. This is possibly pneumonia. Right lower lobe pulmonary nodule measuring 5 mm diameter. No change since prior study. Additional smaller pulmonary nodules are also present without change. Upper Abdomen: No acute abnormalities demonstrated in the visualized upper abdomen. Musculoskeletal:  Degenerative changes in the spine. IMPRESSION: 1. Small chronic pulmonary embolus demonstrated in the right lower lung this corresponds to acute pulmonary embolus seen on the previous study. 2. New right pleural effusion with basilar atelectasis or consolidation. 3. Emphysematous changes throughout the lungs. 4. Pulmonary nodules are unchanged since prior study. Stability for greater than 2 years favors benign etiology. 5. Cardiac enlargement. Electronically Signed   By: Lucienne Capers M.D.   On: 12/20/2021 00:12    EKG: I have personally reviewed EKG: Rate controlled A-fib.  Assessment and Plan: * Acute respiratory failure with hypoxia ([place])- (present on admission) Acute on chronic diastolic heart failure COPD Patient reports 2-week history of progressively worsening dyspnea, cough, wheezing, orthopnea, and bilateral lower extremity edema.  Appears volume  overloaded on exam but not wheezing much, very mild scattered wheezes.  BNP 255.  Echo done in June 2022 showing EF 55 to 60%, mild mitral regurgitation.  She takes Lasix 40 mg 3 times a week as prescribed, ?dietary compliance. CT showing new right pleural effusion with basilar atelectasis or consolidation.  Also has emphysematous changes throughout the lungs on CT and reports history of cigarette smoking several decades ago.  Pneumonia less likely given no fever or leukocytosis.  COVID and flu negative.  Oxygen saturation 84% on room air, currently stable on 2 L supplemental oxygen. -Patient was given IV Lasix at PCPs office (unknown dose) and additional IV Lasix 80 mg in the ED.  She has diuresed over 3 L so far.  Continue IV Lasix 40 mg twice daily.  Monitor intake and output, daily weights.  Emphasize low-sodium diet with fluid restriction.  Monitor BMP.  Continue supplemental oxygen, wean as tolerated.  Albuterol as needed.  Persistent atrial fibrillation with rapid ventricular response ([place])- (present on admission) Rate currently ranging  between 90-110. -Continue home p.o. metoprolol and Eliquis.  Hyperlipidemia -Continue pravastatin  Essential hypertension Stable. -Continue metoprolol and amlodipine.  Primary hyperparathyroidism ([place])- (present on admission) -Continue Sensipar  Chronic pulmonary embolism ([facility]) -Continue Eliquis   DVT prophylaxis: Eliquis Code Status: Full Code.  Patient had signed a DNR form during recent PCP visit on 1/24 but she tells me "it was wrong" and "needs to be torn." She wishes to be full code. Family Communication: No family available at this time. Level of care: Telemetry bed Admission status: It is my clinical opinion that referral for OBSERVATION is reasonable and necessary in this patient based on the above information provided. The aforementioned taken together are felt to place the patient at high risk for further clinical deterioration. However, it is anticipated that the patient may be medically stable for discharge from the hospital within 24 to 48 hours.   Shela Leff, MD [facility] 12/20/2021, 5:31 AM

## 2021-12-20 NOTE — Assessment & Plan Note (Signed)
Continue pravastatin 

## 2021-12-20 NOTE — Progress Notes (Signed)
PROGRESS NOTE   Annette [name]  VEH:[id number]    DOB: 1930/07/29    DOA: 12/19/2021  PCP: [place], [name], MD   I have briefly reviewed patients previous medical records in [facility].  Chief Complaint  Patient presents with   Shortness of Breath    Hospital Course:  88 year old female, lives with her son and daughter-in-law, ambulates with the help of walker, medical history significant for A-fib on Eliquis, PE in December 8366, chronic diastolic CHF, HTN, GERD, primary hyperparathyroidism, seen by PCP on 1/24 for "racing heart" and started on metoprolol 12.5 mg twice daily, losartan discontinued, DNR form signed during that visit, presented to the ED with complaints of dyspnea, cough, bilateral lower extremity edema, orthopnea, wheezing A-fib with RVR in the 110s and hypoxic with oxygen saturations of 84% on room air.  CT chest demonstrated small chronic PE in the right lower lung, new right pleural effusion with basilar atelectasis or consolidation, emphysematous changes throughout the lungs and pulmonary nodules unchanged since prior study.  Admitted for acute respiratory failure with hypoxia due to acute on chronic diastolic CHF and A-fib with mild RVR.  On IV Lasix.   Assessment & Plan:  Principal Problem:   Acute respiratory failure with hypoxia (HCC) Active Problems:   Acute on chronic diastolic CHF (congestive heart failure) (HCC)   Persistent atrial fibrillation with rapid ventricular response (HCC)   Chronic pulmonary embolism (HCC)   Essential hypertension   Hyperlipidemia   Primary hyperparathyroidism (HCC)   Hypokalemia   Assessment and Plan: * Acute respiratory failure with hypoxia ([place])- (present on admission) Acute on chronic diastolic heart failure COPD Although chest x-ray and CT chest do not comment regarding pulmonary edema, clinically volume overloaded. BNP 255.   Echo done in June 2022 showing EF 55 to 60%, mild mitral regurgitation.   She takes  Lasix 40 mg 3 times a week as prescribed, ?dietary compliance.  CT showing new right pleural effusion with basilar atelectasis or consolidation.  Also has emphysematous changes throughout the lungs on CT and reports history of cigarette smoking several decades ago.  COVID and flu negative.   Oxygen saturation 84% on room air, currently stable on 2 L supplemental oxygen.  Not on home oxygen. Patient was given IV Lasix at PCPs office (unknown dose) and additional IV Lasix 80 mg in the ED.   -2.6 L since admission.  Continue IV Lasix 40 mg twice daily. Monitor intake and output, daily weights.  Emphasize low-sodium diet with fluid restriction.    Acute on chronic diastolic CHF (congestive heart failure) ([place])- (present on admission) Management as noted above.  Persistent atrial fibrillation with rapid ventricular response ([place])- (present on admission) Continue home p.o. metoprolol and Eliquis. Rate appears to be reasonably controlled on telemetry at bedside.  Chronic pulmonary embolism (HCC) -Continue Eliquis  Essential hypertension Stable. -Continue metoprolol and amlodipine.  Hyperlipidemia -Continue pravastatin  Primary hyperparathyroidism ([place])- (present on admission) -Continue Sensipar  Hypokalemia Replaced.  Follow BMP in a.m.       There is no height or weight on file to calculate BMI.  Nutritional Status        Pressure Ulcer:     DVT prophylaxis:   Eliquis anticoagulation   Code Status: Full Code:  Family Communication: None at bedside. Disposition:  Status is: Observation The patient will require care spanning > 2 midnights and should be moved to inpatient because: Need for IV Lasix due to decompensated CHF.  Consultants:   Cardiology  Procedures:   None  Antimicrobials:      Subjective:  Seen this morning while still in ED.  Dyspnea better but still has dyspnea and breathing is not yet back at baseline.  Reports that she urinated  quite a bit since hospital arrival.  No chest pain.  Objective:   Vitals:   12/20/21 0330 12/20/21 0345 12/20/21 0533 12/20/21 0544  BP: (!) 144/90 136/73  132/84  Pulse: 95 87 71 93  Resp: 20 20 15 18   Temp:      TempSrc:      SpO2: 98% 94% 96% 97%    General exam: Elderly female, moderately built and nourished lying comfortably propped up in bed without distress. Respiratory system: Reduced breath sounds right base with few right basal crackles.  Rest of lung fields clear to auscultation.  Appears mildly dyspneic even when speaking. Cardiovascular system: S1 & S2 heard, irregularly irregular. No JVD, murmurs, rubs, gallops or clicks.  2+ pitting leg edema up to knees.  Telemetry personally reviewed at bedside: A-fib with controlled ventricular rate. Gastrointestinal system: Abdomen is nondistended, soft and nontender. No organomegaly or masses felt. Normal bowel sounds heard. Central nervous system: Alert and oriented. No focal neurological deficits. Extremities: Symmetric 5 x 5 power. Skin: No rashes, lesions or ulcers Psychiatry: Judgement and insight appear normal. Mood & affect appropriate.     Data Reviewed:   I have personally reviewed following labs and imaging studies   CBC: Recent Labs  Lab 12/19/21 1013  WBC 7.3  NEUTROABS 5.1  HGB 14.9  HCT 44.7  MCV 93.7  PLT 740    Basic Metabolic Panel: Recent Labs  Lab 12/19/21 1013 12/20/21 0553  NA 138 141  K 4.0 3.3*  CL 102 101  CO2 24 29  GLUCOSE 106* 133*  BUN 14 12  CREATININE 1.07* 0.98  CALCIUM 9.7 9.7    Liver Function Tests: No results for input(s): AST, ALT, ALKPHOS, BILITOT, PROT, ALBUMIN in the last 168 hours.  CBG: No results for input(s): GLUCAP in the last 168 hours.  Microbiology Studies:   Recent Results (from the past 240 hour(s))  Resp Panel by RT-PCR (Flu A&B, Covid) Nasopharyngeal Swab     Status: None   Collection Time: 12/19/21 10:14 PM   Specimen: Nasopharyngeal Swab;  Nasopharyngeal(NP) swabs in vial transport medium  Result Value Ref Range Status   SARS Coronavirus 2 by RT PCR NEGATIVE NEGATIVE Final    Comment: (NOTE) SARS-CoV-2 target nucleic acids are NOT DETECTED.  The SARS-CoV-2 RNA is generally detectable in upper respiratory specimens during the acute phase of infection. The lowest concentration of SARS-CoV-2 viral copies this assay can detect is 138 copies/mL. A negative result does not preclude SARS-Cov-2 infection and should not be used as the sole basis for treatment or other patient management decisions. A negative result may occur with  improper specimen collection/handling, submission of specimen other than nasopharyngeal swab, presence of viral mutation(s) within the areas targeted by this assay, and inadequate number of viral copies(<138 copies/mL). A negative result must be combined with clinical observations, patient history, and epidemiological information. The expected result is Negative.  Fact Sheet for Patients:  EntrepreneurPulse.com.au  Fact Sheet for Healthcare Providers:  IncredibleEmployment.be  This test is no t yet approved or cleared by the [place] FDA and  has been authorized for detection and/or diagnosis of SARS-CoV-2 by FDA under an Emergency Use Authorization (EUA). This EUA will remain  in  effect (meaning this test can be used) for the duration of the COVID-19 declaration under Section 564(b)(1) of the Act, 21 U.S.C.section 360bbb-3(b)(1), unless the authorization is terminated  or revoked sooner.       Influenza A by PCR NEGATIVE NEGATIVE Final   Influenza B by PCR NEGATIVE NEGATIVE Final    Comment: (NOTE) The Xpert Xpress SARS-CoV-2/FLU/RSV plus assay is intended as an aid in the diagnosis of influenza from Nasopharyngeal swab specimens and should not be used as a sole basis for treatment. Nasal washings and aspirates are unacceptable for Xpert Xpress  SARS-CoV-2/FLU/RSV testing.  Fact Sheet for Patients: EntrepreneurPulse.com.au  Fact Sheet for Healthcare Providers: IncredibleEmployment.be  This test is not yet approved or cleared by the [place] FDA and has been authorized for detection and/or diagnosis of SARS-CoV-2 by FDA under an Emergency Use Authorization (EUA). This EUA will remain in effect (meaning this test can be used) for the duration of the COVID-19 declaration under Section 564(b)(1) of the Act, 21 U.S.C. section 360bbb-3(b)(1), unless the authorization is terminated or revoked.  Performed at [facility], [place] [address], [place], [place] [address]     Radiology Studies:  DG Chest 2 View  Result Date: 12/19/2021 CLINICAL DATA:  Shortness of breath, cough. EXAM: CHEST - 2 VIEW COMPARISON:  November 12, 2019. FINDINGS: Stable cardiomediastinal silhouette. Mild bibasilar subsegmental atelectasis or scarring is noted. Probable small right pleural effusion is noted. Bony thorax is unremarkable. IMPRESSION: Mild bibasilar subsegmental atelectasis or scarring. Probable small right pleural effusion. Aortic Atherosclerosis (ICD10-I70.0). Electronically Signed   By: Marijo Conception M.D.   On: 12/19/2021 10:33   CT Chest W Contrast  Result Date: 12/20/2021 CLINICAL DATA:  Chronic dyspnea of unclear etiology. Shortness of breath and cough for 1 week. EXAM: CT CHEST WITH CONTRAST TECHNIQUE: Multidetector CT imaging of the chest was performed during intravenous contrast administration. RADIATION DOSE REDUCTION: This exam was performed according to the departmental dose-optimization program which includes automated exposure control, adjustment of the mA and/or kV according to patient size and/or use of iterative reconstruction technique. CONTRAST:  22mL OMNIPAQUE IOHEXOL 300 MG/ML  SOLN COMPARISON:  CT 11/12/2019.  Chest radiograph 12/19/2021 FINDINGS: Cardiovascular: Cardiac enlargement. No  pericardial effusions. Normal caliber thoracic aorta. No aortic dissection. Aortic calcifications. Great vessel origins are patent. Persistent eccentric thrombus in right lower lobe subsegmental pulmonary artery. This represents residual chronic pulmonary embolus seen on prior study. Other emboli have resolved. Mediastinum/Nodes: Esophagus is decompressed. No significant lymphadenopathy. Thyroid gland is unremarkable. Lungs/Pleura: Emphysematous changes throughout the lungs. Areas of linear scarring mostly in the left mid lung. New small right pleural effusion with basilar atelectasis or infiltration. This is possibly pneumonia. Right lower lobe pulmonary nodule measuring 5 mm diameter. No change since prior study. Additional smaller pulmonary nodules are also present without change. Upper Abdomen: No acute abnormalities demonstrated in the visualized upper abdomen. Musculoskeletal: Degenerative changes in the spine. IMPRESSION: 1. Small chronic pulmonary embolus demonstrated in the right lower lung this corresponds to acute pulmonary embolus seen on the previous study. 2. New right pleural effusion with basilar atelectasis or consolidation. 3. Emphysematous changes throughout the lungs. 4. Pulmonary nodules are unchanged since prior study. Stability for greater than 2 years favors benign etiology. 5. Cardiac enlargement. Electronically Signed   By: Lucienne Capers M.D.   On: 12/20/2021 00:12    Scheduled Meds:    amLODipine  5 mg Oral QHS   apixaban  5 mg Oral BID  cinacalcet  30 mg Oral Q breakfast   furosemide  40 mg Intravenous BID   latanoprost  1 drop Both Eyes QHS   metoprolol tartrate  12.5 mg Oral BID   pantoprazole  40 mg Oral Daily   potassium chloride  40 mEq Oral Once   pravastatin  20 mg Oral QHS    Continuous Infusions:     LOS: 0 days     Vernell Leep, MD,  FACP, [facility], [facility], [facility] (Care Management Physician Certified) [place]  To  contact the attending provider between 7A-7P or the covering provider during after hours 7P-7A, please log into the web site [email or web address] and access using universal [place] password for that web site. If you do not have the password, please call the hospital operator.  12/20/2021, 8:23 AM

## 2021-12-20 NOTE — Assessment & Plan Note (Addendum)
Continue home p.o. metoprolol and Eliquis. Controlled ventricular rate.

## 2021-12-20 NOTE — Assessment & Plan Note (Addendum)
Replaced.  Replaced magnesium prior to discharge.

## 2021-12-20 NOTE — Consult Note (Addendum)
Cardiology Consultation:   Patient ID: ROANNA REAVES MRN: [id number]; DOB: Sep 05, 1930  Admit date: 12/19/2021 Date of Consult: 12/20/2021  PCP:  Martinique, [name], MD   [facility] Providers Cardiologist:  [name], MD   {  Patient Profile:   Annette [name] is a 88 y.o. female with a history of permanent atrial fibrillation on Eliquis, chronic diastolic CHF, prior PE in 10/2019, hypertension, GERD, primary hyperparathyroidism, who is being seen today for evaluation of acute on chronic CHF at the request of Dr. Algis Liming.  History of Present Illness:   Annette [name] is a 88 year old female with the above history who is followed by Dr. Gardiner Rhyme.  Patient was referred to Dr. Gardiner Rhyme in 03/2021 for further evaluation of atrial fibrillation which on review of his chart, she had been in since 10/2019.  ZIO monitor and Echo ordered at that initial visit. Zio showed continuous atrial fibrillation (100% burden) with average rate of 71 bpm but she only wore this for 9 hours..  EF of 55-60% with no regional wall motion abnormalities and mild LVH as well as mildly reduced RV systolic function, mild MR, mild to moderate TR, mildly elevated PASP of 35.3 mmHg, and PFO with evidence of atrial level shunting.  She was started on chronic anticoagulation with Eliquis.  Patient was last seen by Dr. Gardiner Rhyme in 05/2021 at which time rates were mildly elevated.  3-day Zio monitor was ordered given patient only wore previous monitor for 9 hours.  Monitor showed continuous atrial fibrillation with average rate of 75 bpm and rare PVCs/ventricular couplets.  Patient presented to the ED on 12/19/2021 via EMS for further evaluation of shortness of breath.  Patient reports she was in her usual state of health until about 2 weeks ago when she started to notice shortness of breath both at rest and with exertion but more so with exertion.  She would have dyspnea with activities such as taking out the trash which is new  for her.  She would also state that sometimes she would gasp for air at rest.  She sleeps on 3 pillows but states she has done this for a long time.  No PND.  She has chronic lower extremity edema for which she takes Lasix 3 times a week but states this has been worse than usual over the past 2 weeks.  She denies any weight gain though and states she has lost 30 pounds over the last year or so unintentional.  She denies any chest pain, palpitations, lightheadedness, dizziness, syncope.  Reports some wheezing with her shortness of breath but denies any cough/nasal congestion.  No one she notes occasional diarrhea but no other GI symptoms.  She is compliant with all of her medications and reports no abnormal bleeding on Eliquis.  Of note, patient was recently started on Lopressor 12.5 mg twice daily after her visit with her PCP on 12/01/2021 and all of her symptoms seem to start after this.  Her PCP also stopped her Losartan at that time due to skin peeling with this.  Patient states she watches her salt intake very closely and follows a low sodium diet.  In the ED, patient hypoxic with O2 sat of 84% on room air which improved with 2 L via nasal cannula.  EKG showed atrial fibrillation, rate 90 bpm, with slight ST depressions in leads V4-V5.  High-sensitivity troponin negative.  BNP elevated at 255.  Chest x-ray showed mild bibasilar subsegmental atelectasis or scarring and probable small  right pleural effusion. Chest CT showed small chronic PE demonstrated in the right lower lung and new right pleural effusion with basilar atelectasis or consolidation as well as an emphysematous changes throughout the lungs and stable pulmonary nodules.  WBC 7.3, Hgb 14.9, Plts 213. Na 138, K 4.0, Glucose 106, Cr 1.07, BUN 14. Respiratory panel negative for COVID and influenza.  Patient was started on IV Lasix and admitted for further management.  Cardiology consulted for further evaluation.  Past Medical History:  Diagnosis Date    GERD 10/03/2007   HYPERTENSION 10/03/2007   OSTEOPENIA 02/03/2008   Primary hyperparathyroidism ([place]) 10/03/2007    Past Surgical History:  Procedure Laterality Date   ABDOMINAL HYSTERECTOMY       Home Medications:  Prior to Admission medications   Medication Sig Start Date End Date Taking? Authorizing Provider  acetaminophen (TYLENOL) 500 MG tablet Take 500 mg by mouth 3 (three) times daily.   Yes [provider]  amLODipine (NORVASC) 5 MG tablet TAKE 1 TABLET EVERY DAY Patient taking differently: Take 5 mg by mouth at bedtime. 12/15/21  Yes Martinique, [name], MD  apixaban (ELIQUIS) 5 MG TABS tablet Take 1 tablet (5 mg total) by mouth 2 (two) times daily. 03/24/21  Yes [name], MD  cinacalcet (SENSIPAR) 30 MG tablet TAKE 1 TABLET EVERY DAY Patient taking differently: Take 30 mg by mouth. 11/20/21  Yes Renato Shin, MD  furosemide (LASIX) 40 MG tablet TAKE 1 TABLET THREE TIMES WEEKLY Patient taking differently: Take 40 mg by mouth every Monday, Wednesday, and Friday. 06/19/21  Yes Martinique, [name], MD  latanoprost (XALATAN) 0.005 % ophthalmic solution INSTILL 1 DROP INTO BOTH EYES AT BEDTIME Patient taking differently: Place 1 drop into both eyes at bedtime. 10/29/17  Yes Marletta Lor, MD  omeprazole (PRILOSEC) 20 MG capsule TAKE 1 CAPSULE EVERY DAY Patient taking differently: Take 20 mg by mouth daily. 11/08/21  Yes Martinique, [name], MD  pravastatin (PRAVACHOL) 20 MG tablet TAKE 1 TABLET EVERY DAY Patient taking differently: Take 20 mg by mouth at bedtime. 09/06/21  Yes Martinique, [name], MD  vitamin B-12 (CYANOCOBALAMIN) 1000 MCG tablet Take 1,000 mcg by mouth 2 (two) times a week. Tuesday and thursday   Yes [provider]  VITAMIN D PO Take 1,000 Units by mouth at bedtime.   Yes [provider]  metoprolol tartrate (LOPRESSOR) 25 MG tablet Take 0.5 tablets (12.5 mg total) by mouth 2 (two) times daily. Patient not taking: Reported on 12/20/2021  12/05/21   Martinique, [name], MD    Inpatient Medications: Scheduled Meds:  amLODipine  5 mg Oral QHS   apixaban  5 mg Oral BID   cinacalcet  30 mg Oral Q breakfast   furosemide  40 mg Intravenous BID   latanoprost  1 drop Both Eyes QHS   metoprolol tartrate  12.5 mg Oral BID   pantoprazole  40 mg Oral Daily   pravastatin  20 mg Oral QHS   Continuous Infusions:  PRN Meds: acetaminophen **OR** acetaminophen, albuterol  Allergies:    Allergies  Allergen Reactions   Codeine Nausea And Vomiting    Social History:   Social History   Socioeconomic History   Marital status: Widowed    Spouse name: Not on file   Number of children: Not on file   Years of education: Not on file   Highest education level: Not on file  Occupational History   Occupation: retired  Tobacco Use  Smoking status: Former    Types: Cigarettes    Quit date: 11/12/1978    Years since quitting: 43.1   Smokeless tobacco: Never  Substance and Sexual Activity   Alcohol use: No   Drug use: No   Sexual activity: Not on file  Other Topics Concern   Not on file  Social History Narrative   Pt states her son and daughter in law live with her   Social Determinants of Health   Financial Resource Strain: Low Risk    Difficulty of Paying Living Expenses: Not hard at all  Food Insecurity: No Food Insecurity   Worried About Charity fundraiser in the Last Year: Never true   [place] in the Last Year: Never true  Transportation Needs: No Transportation Needs   Lack of Transportation (Medical): No   Lack of Transportation (Non-Medical): No  Physical Activity: Inactive   Days of Exercise per Week: 0 days   Minutes of Exercise per Session: 0 min  Stress: No Stress Concern Present   Feeling of Stress : Not at all  Social Connections: Moderately Isolated   Frequency of Communication with Friends and Family: More than three times a week   Frequency of Social Gatherings with Friends and Family: Never    Attends Religious Services: More than 4 times per year   Active Member of [employer] or Organizations: No   Attends Archivist Meetings: Never   Marital Status: Widowed  Human resources officer Violence: Not At Risk   Fear of Current or Ex-Partner: No   Emotionally Abused: No   Physically Abused: No   Sexually Abused: No    Family History:   Family History  Problem Relation Age of Onset   Hypercalcemia Neg Hx      ROS:  Please see the history of present illness.  Review of Systems  Constitutional:  Negative for fever.  HENT:  Negative for congestion.   Respiratory:  Positive for shortness of breath and wheezing. Negative for cough.   Cardiovascular:  Positive for orthopnea and leg swelling. Negative for chest pain, palpitations and PND.  Gastrointestinal:  Positive for diarrhea (occasionally). Negative for blood in stool, melena, nausea and vomiting.  Genitourinary:  Negative for hematuria.  Musculoskeletal:  Negative for myalgias.  Neurological:  Negative for dizziness and loss of consciousness.  Endo/Heme/Allergies:  Does not bruise/bleed easily.  Psychiatric/Behavioral:  Negative for substance abuse.     Physical Exam/Data:   Vitals:   12/20/21 0855 12/20/21 1000 12/20/21 1200 12/20/21 1500  BP: (!) 148/83 124/78 (!) 152/86 106/66  Pulse: 90 89 71 69  Resp: 19 (!) 23 18 (!) 22  Temp:      TempSrc:      SpO2: 92% 97% 94% 96%    Intake/Output Summary (Last 24 hours) at 12/20/2021 1604 Last data filed at 12/20/2021 1200 Gross per 24 hour  Intake --  Output 3200 ml  Net -3200 ml   Last 3 Weights 12/05/2021 10/09/2021 06/06/2021  Weight (lbs) 152 lb 4 oz 151 lb 153 lb 9.6 oz  Weight (kg) 69.06 kg 68.493 kg 69.673 kg     There is no height or weight on file to calculate BMI.  General: 88 y.o. female resting comfortably in no acute distress. HEENT: Normocephalic and atraumatic. Sclera clear. Neck: Supple. No carotid bruits. Possible JVD elevation. Heart: Irregularly  irregular rhythm. Distinct S1 and S2. No murmurs, gallops, or rubs. Radial and distal pedal pulses 2+ and equal bilaterally.  Lungs: No increased work of breathing. Decreased breath sounds in bases but no wheezes, rhonchi, or rales appreciated. Abdomen: Soft, non-distended, and non-tender to palpation. Bowel sounds present. Extremities: 1+ lower extremity edema bilaterally.  Skin: Warm and dry. Neuro: Alert and oriented x3. No focal deficits. Psych: Normal affect. Responds appropriately.   EKG:  The EKG was personally reviewed and demonstrates: Atrial fibrillation, rate 90 bpm, with slight ST depressions in leads V4-V5. Left axis deviation. QTc 401 ms.  Telemetry:  Telemetry was personally reviewed and demonstrates:  Atrial fibrillation with rates mostly in the 70s to 90s (occasionally in the low 100s). Some PVC and rare ventricular couplets noted.  Relevant CV Studies:  Echocardiogram 04/20/2021: Impressions:  1. Left ventricular ejection fraction, by estimation, is 55 to 60%. The  left ventricle has normal function. The left ventricle has no regional  wall motion abnormalities. There is mild left ventricular hypertrophy.  Left ventricular diastolic parameters  are indeterminate.   2. Right ventricular systolic function is low normal. The right  ventricular size is normal. There is mildly elevated pulmonary artery  systolic pressure. The estimated right ventricular systolic pressure is  95.0 mmHg.   3. Right atrial size was mildly dilated.   4. The mitral valve is normal in structure. Mild mitral valve  regurgitation. No evidence of mitral stenosis.   5. Tricuspid valve regurgitation is mild to moderate.   6. The aortic valve is tricuspid. Aortic valve regurgitation is not  visualized. Mild aortic valve sclerosis is present, with no evidence of  aortic valve stenosis.   7. The inferior vena cava is normal in size with greater than 50%  respiratory variability, suggesting right atrial  pressure of 3 mmHg.   8. Evidence of atrial level shunting detected by color flow Doppler.  There is a patent foramen ovale with predominantly left to right shunting  across the atrial septum.  _______________  Elwyn Reach Monitor 05/2021: Atrial Fibrillation occurred continuously (100% burden), ranging from 50-138 bpm (avg of 75 bpm). Isolated VEs were rare (<1.0%), VE Couplets were rare (<1.0%), and no VE Triplets were present.     Laboratory Data:  High Sensitivity Troponin:   Recent Labs  Lab 12/19/21 2325  TROPONINIHS 15     Chemistry Recent Labs  Lab 12/19/21 1013 12/20/21 0553  NA 138 141  K 4.0 3.3*  CL 102 101  CO2 24 29  GLUCOSE 106* 133*  BUN 14 12  CREATININE 1.07* 0.98  CALCIUM 9.7 9.7  GFRNONAA 50* 55*  ANIONGAP 12 11    No results for input(s): PROT, ALBUMIN, AST, ALT, ALKPHOS, BILITOT in the last 168 hours. Lipids No results for input(s): CHOL, TRIG, HDL, LABVLDL, LDLCALC, CHOLHDL in the last 168 hours.  Hematology Recent Labs  Lab 12/19/21 1013  WBC 7.3  RBC 4.77  HGB 14.9  HCT 44.7  MCV 93.7  MCH 31.2  MCHC 33.3  RDW 13.6  PLT 213   Thyroid No results for input(s): TSH, FREET4 in the last 168 hours.  BNP Recent Labs  Lab 12/19/21 2325  BNP 255.2*    DDimer No results for input(s): DDIMER in the last 168 hours.   Radiology/Studies:  DG Chest 2 View  Result Date: 12/19/2021 CLINICAL DATA:  Shortness of breath, cough. EXAM: CHEST - 2 VIEW COMPARISON:  November 12, 2019. FINDINGS: Stable cardiomediastinal silhouette. Mild bibasilar subsegmental atelectasis or scarring is noted. Probable small right pleural effusion is noted. Bony thorax is unremarkable. IMPRESSION: Mild bibasilar subsegmental atelectasis  or scarring. Probable small right pleural effusion. Aortic Atherosclerosis (ICD10-I70.0). Electronically Signed   By: Marijo Conception M.D.   On: 12/19/2021 10:33   CT Chest W Contrast  Result Date: 12/20/2021 CLINICAL DATA:  Chronic dyspnea of  unclear etiology. Shortness of breath and cough for 1 week. EXAM: CT CHEST WITH CONTRAST TECHNIQUE: Multidetector CT imaging of the chest was performed during intravenous contrast administration. RADIATION DOSE REDUCTION: This exam was performed according to the departmental dose-optimization program which includes automated exposure control, adjustment of the mA and/or kV according to patient size and/or use of iterative reconstruction technique. CONTRAST:  9mL OMNIPAQUE IOHEXOL 300 MG/ML  SOLN COMPARISON:  CT 11/12/2019.  Chest radiograph 12/19/2021 FINDINGS: Cardiovascular: Cardiac enlargement. No pericardial effusions. Normal caliber thoracic aorta. No aortic dissection. Aortic calcifications. Great vessel origins are patent. Persistent eccentric thrombus in right lower lobe subsegmental pulmonary artery. This represents residual chronic pulmonary embolus seen on prior study. Other emboli have resolved. Mediastinum/Nodes: Esophagus is decompressed. No significant lymphadenopathy. Thyroid gland is unremarkable. Lungs/Pleura: Emphysematous changes throughout the lungs. Areas of linear scarring mostly in the left mid lung. New small right pleural effusion with basilar atelectasis or infiltration. This is possibly pneumonia. Right lower lobe pulmonary nodule measuring 5 mm diameter. No change since prior study. Additional smaller pulmonary nodules are also present without change. Upper Abdomen: No acute abnormalities demonstrated in the visualized upper abdomen. Musculoskeletal: Degenerative changes in the spine. IMPRESSION: 1. Small chronic pulmonary embolus demonstrated in the right lower lung this corresponds to acute pulmonary embolus seen on the previous study. 2. New right pleural effusion with basilar atelectasis or consolidation. 3. Emphysematous changes throughout the lungs. 4. Pulmonary nodules are unchanged since prior study. Stability for greater than 2 years favors benign etiology. 5. Cardiac  enlargement. Electronically Signed   By: Lucienne Capers M.D.   On: 12/20/2021 00:12     Assessment and Plan:   Acute on Chronic Diastolic CHF Acute Hypoxic Respiratory Failure Patient presented with worsening shortness of breath no O2 sats in the 80s.  BNP mildly elevated in the 200s. Chest x-ray showed mild bibasilar subsegmental atelectasis or scarring and probable small right pleural effusion. Chest CT showed small chronic PE demonstrated in the right lower lung and new right pleural effusion with basilar atelectasis or consolidation as well as an emphysematous changes throughout the lungs and stable pulmonary nodules. Echo in 04/2021 showed normal LV function. Patient was started on IV Lasix with good response - 3.2 L of documented output so far. - She is volume overloaded on exam. - Will update Echo. - Will decrease IV Lasix to 40mg  daily given brisk output so far. - Continue to monitor daily weights, strict I/Os, and renal function.  Permanent Atrial Fibrillation Rate controlled. - Continue Lopressor 12.5mg  twice daily. - Continue chronic anticoagulation with Eliquis 5mg  twice daily.  Chronic PE Initial diagnosed in 10/2019. Chest CT this admission showed small chronic PE in right lower lobe.  - Continue Eliquis as above.  Hypertension BP mildly elevated on admission but now improved. - Continue home Amlodipine 5mg  daily and Lopressor 12.5mg  twice daily.  Hypokalemia Potassium 4.0 on admission and 3.3 today after aggressive diuresis. - She was supplemented Potassium chloride 40 mEQ earlier today. Will give another 40 mEq. - Magnesium pending.   Risk Assessment/Risk Scores:      New York Heart Association (NYHA) Functional Class NYHA Class III  CHA2DS2-VASc Score = 7  This indicates a 11.2% annual risk of stroke. The  patient's score is based upon: CHF History: 1 HTN History: 1 Diabetes History: 0 Stroke History: 2 (PE) Vascular Disease History: 0 Age Score:  2 Gender Score: 1     For questions or updates, please contact [place] Please consult [email or web address] for contact info under    Signed, Darreld Mclean, PA-C  12/20/2021 4:04 PM  Attending Note:   The patient was seen and examined.  Agree with assessment and plan as noted above.  Changes made to the above note as needed.  Patient seen and independently examined with  Sande Rives, PA .   We discussed all aspects of the encounter. I agree with the assessment and plan as stated above.     CHF :   pt has acute CHF.   No clear cut exacerbating factors.   No salt. No licorice.    Responded well to IV lasix .  Update lasix I anticipate DC soon.  2.  Atrial fib:   chronic.   HR is well controlled  .   Cont meds.     I have spent a total of 40 minutes with patient reviewing hospital  notes , telemetry, EKGs, labs and examining patient as well as establishing an assessment and plan that was discussed with the patient.  > 50% of time was spent in direct patient care.    Thayer Headings, Brooke Bonito., MD, [facility] 12/20/2021, 4:38 PM [address]  [place] Pager [phone]

## 2021-12-20 NOTE — Hospital Course (Addendum)
88 year old female, lives with her son and daughter-in-law, ambulates with the help of walker, medical history significant for A-fib on Eliquis, PE in December 7494, chronic diastolic CHF, HTN, GERD, primary hyperparathyroidism, seen by PCP on 1/24 for "racing heart" and started on metoprolol 12.5 mg twice daily, losartan discontinued, DNR form signed during that visit, presented to the ED with complaints of dyspnea, cough, bilateral lower extremity edema, orthopnea, wheezing A-fib with RVR in the 110s and hypoxic with oxygen saturations of 84% on room air.  CT chest demonstrated small chronic PE in the right lower lung, new right pleural effusion with basilar atelectasis or consolidation, emphysematous changes throughout the lungs and pulmonary nodules unchanged since prior study.  Admitted for acute respiratory failure with hypoxia due to acute on chronic diastolic CHF and A-fib with mild RVR.  Treated with IV Lasix.  Cardiology consulted.

## 2021-12-20 NOTE — ED Notes (Signed)
Pt in afib HR 105-116. [place] MD made aware

## 2021-12-21 ENCOUNTER — Other Ambulatory Visit (HOSPITAL_COMMUNITY): Payer: Medicare HMO

## 2021-12-21 ENCOUNTER — Telehealth: Payer: Self-pay | Admitting: Cardiology

## 2021-12-21 DIAGNOSIS — I5033 Acute on chronic diastolic (congestive) heart failure: Secondary | ICD-10-CM

## 2021-12-21 DIAGNOSIS — N179 Acute kidney failure, unspecified: Secondary | ICD-10-CM | POA: Diagnosis not present

## 2021-12-21 LAB — BASIC METABOLIC PANEL
Anion gap: 9 (ref 5–15)
BUN: 19 mg/dL (ref 8–23)
CO2: 31 mmol/L (ref 22–32)
Calcium: 9.1 mg/dL (ref 8.9–10.3)
Chloride: 99 mmol/L (ref 98–111)
Creatinine, Ser: 1.29 mg/dL — ABNORMAL HIGH (ref 0.44–1.00)
GFR, Estimated: 40 mL/min — ABNORMAL LOW (ref >60)
Glucose, Bld: 98 mg/dL (ref 70–99)
Potassium: 4 mmol/L (ref 3.5–5.1)
Sodium: 139 mmol/L (ref 135–145)

## 2021-12-21 LAB — MAGNESIUM: Magnesium: 1.4 mg/dL — ABNORMAL LOW (ref 1.7–2.4)

## 2021-12-21 MED ORDER — MAGNESIUM SULFATE 4 GM/100ML IV SOLN
4.0000 g | Freq: Once | INTRAVENOUS | Status: AC
  Administered 2021-12-21: 4 g via INTRAVENOUS
  Filled 2021-12-21 (×2): qty 100

## 2021-12-21 NOTE — TOC Transition Note (Addendum)
Transition of Care [facility]) - CM/SW Discharge Note   Patient Details  Name: Annette [name] MRN: [id number] Date of Birth: 16-Aug-1930  [facility] of [facility] CM/SW Contact:  Zenon Mayo, RN Phone Number: 12/21/2021, 2:13 PM   Clinical Narrative:    Patient is for dc today, home with her son and his wife,  she has several canes at home, a walker and uses it all the time. She has a scale , weighs once a week, check bp every day.  She does not eat salt  with diet but sometimes it is an exception she says.  NCM offered choice for HHRN, [place] , HHPT.  She cose [facility] .  NCM made referral to [facility] with [facility].  Soc will begin 24 to 48 hrs post dc. She will also need home oxygen.  She is ok with [employer] supplying the oxygen for her. She states her son will transport her home.   Final next level of care: [place] Barriers to Discharge: No Barriers Identified   Patient Goals and CMS Choice Patient states their goals for this hospitalization and ongoing recovery are:: return home with [facility] CMS Medicare.gov Compare Post Acute Care list provided to:: Patient Choice offered to / list presented to : Patient  Discharge Placement                       Discharge Plan and Services                DME Arranged: Oxygen DME Agency: [employer] Date DME Agency Contacted: 12/21/21 Time DME Agency Contacted: [phone] Representative spoke with at DME Agency: [place]: RN, PT, Nurse's Aide [place] Agency: [place] Date [place]: 12/21/21 Time [place]: 2863 Representative spoke with at [place]: [place] ([place]) Interventions     Readmission Risk Interventions No flowsheet data found.

## 2021-12-21 NOTE — Discharge Summary (Addendum)
Physician Discharge Summary  Annette [name] PJK:[id number] DOB: 1930/06/11  PCP: Martinique, [name], MD  Admitted from: Home Discharged to: Home  Admit date: 12/19/2021 Discharge date: 12/21/2021  Recommendations for Outpatient Follow-up:    Follow-up Information     Martinique, [name], MD. Go on 01/01/2022.   Specialty: Family Medicine Why: To be seen with repeat labs (CBC, BMP, & Mg).@2 :00pm Contact information: [place] [address] [phone]         Annette Heinz, MD. Schedule an appointment as soon as possible for a visit in 1 week(s).   Specialties: Cardiology, Radiology Contact information: [address] [place] [address] [phone]         Care, [facility] Follow up.   Specialty: Home Health Services Why: HHRN, HHPT, HHAIDE Contact information: [place] [address] [phone]         [facility] Follow up.   Why: home oxygen Contact information: [address] [place] [address] [phone]                  Home Health: PT, RN for med Mx and Aide    Equipment/Devices:     Durable Medical Equipment  (From admission, onward)           Start     Ordered   12/21/21 1428  For home use only DME oxygen  Once       Question Answer Comment  Length of Need 6 Months   Mode or (Route) Nasal cannula   Liters per Minute 2   Frequency Continuous (stationary and portable oxygen unit needed)   Oxygen conserving device Yes   Oxygen delivery system Gas      12/21/21 1427             Discharge Condition: Improved and stable.    Code Status: Full Code Diet recommendation:  Discharge Diet Orders (From admission, onward)     Start     Ordered   12/21/21 0000  Diet - low sodium heart healthy        12/21/21 1333             Discharge Diagnoses:  Principal Problem:   Acute respiratory failure with hypoxia (HCC) Active  Problems:   Acute on chronic diastolic CHF (congestive heart failure) (HCC)   Persistent atrial fibrillation with rapid ventricular response (HCC)   Chronic pulmonary embolism (HCC)   Essential hypertension   Hyperlipidemia   Primary hyperparathyroidism (HCC)   Hypokalemia   Hypomagnesemia   AKI (acute kidney injury) [facility])   Brief Summary: 88 year old female, lives with her son and daughter-in-law, ambulates with the help of walker, medical history significant for A-fib on Eliquis, PE in December 9379, chronic diastolic CHF, HTN, GERD, primary hyperparathyroidism, seen by PCP on 1/24 for "racing heart" and started on metoprolol 12.5 mg twice daily, losartan discontinued, DNR form signed during that visit, presented to the ED with complaints of dyspnea, cough, bilateral lower extremity edema, orthopnea, wheezing A-fib with RVR in the 110s and hypoxic with oxygen saturations of 84% on room air.  CT chest demonstrated small chronic PE in the right lower lung, new right pleural effusion with basilar atelectasis or consolidation, emphysematous changes throughout the lungs and pulmonary nodules unchanged since prior study.  Admitted for acute respiratory failure with hypoxia due to acute on chronic diastolic CHF and A-fib with mild RVR.  Treated with IV Lasix.  Cardiology consulted.  Assessment & Plan: * Acute respiratory failure with hypoxia (HCC)- (present on admission) Acute on chronic diastolic heart failure COPD Although chest x-ray and CT chest do not comment regarding pulmonary edema, clinically volume overloaded. BNP 255.   Echo done in June 2022 showing EF 55 to 60%, mild mitral regurgitation.   She takes Lasix 40 mg 3 times a week as prescribed, ?dietary compliance.  CT showing new right pleural effusion with basilar atelectasis or consolidation.  Also has emphysematous changes throughout the lungs on CT and reports history of cigarette smoking several decades ago.  COVID and flu  negative.   Oxygen saturation 84% on room air on admission.  Not on home oxygen. Patient was given IV Lasix at PCPs office (unknown dose) and additional IV Lasix 80 mg in the ED.   Patient was continued on IV Lasix 40 mg daily in the hospital. She is -2.97 L since admission and almost clinically euvolemic. Due to an uptick in her creatinine, IV Lasix was stopped this morning. Cardiology follow-up appreciated.  I discussed with Annette [name].  He feels that her decompensation was due to eating extra salt and bacon and sausage.  Patient has been counseled regarding low-salt diet.  He recommends continuing prior home dose of Lasix 40 mg 3 times per week and she has been counseled to take an additional 40 mg for volume excess as noted in discharge instructions. Outpatient follow-up with primary cardiologist and PCP Hypoxia seems to have resolved but awaiting RN to perform a formal home O2 evaluation.   Acute on chronic diastolic CHF (congestive heart failure) ([place])- (present on admission) Management as noted above.  Persistent atrial fibrillation with rapid ventricular response ([place])- (present on admission) Continue home p.o. metoprolol and Eliquis. Controlled ventricular rate.  Chronic pulmonary embolism ([place])- (present on admission) -Continue Eliquis  Essential hypertension Stable. -Continue metoprolol and amlodipine.  Hyperlipidemia -Continue pravastatin  Primary hyperparathyroidism ([place])- (present on admission) -Continue Sensipar  Hypokalemia Replaced.  Replaced magnesium prior to discharge.  Hypomagnesemia Mg 1.4.  Replaced IV prior to discharge.  Follow as outpatient.  AKI (acute kidney injury) ([place]) Due to Lasix diuresis Dc'ed IV Lasix. Expecting that it will return to normal.  Follow-up BMP as outpatient.    Body mass index is 26.67 kg/m.   Consultations: Cardiology  Procedures: None   Discharge Instructions  Discharge Instructions     (HEART FAILURE  PATIENTS) Call MD:  Anytime you have any of the following symptoms: 1) 3 pound weight gain in 24 hours or 5 pounds in 1 week 2) shortness of breath, with or without a dry hacking cough 3) swelling in the hands, feet or stomach 4) if you have to sleep on extra pillows at night in order to breathe.   Complete by: As directed    Call MD for:  difficulty breathing, headache or visual disturbances   Complete by: As directed    Call MD for:  extreme fatigue   Complete by: As directed    Call MD for:  persistant dizziness or light-headedness   Complete by: As directed    Diet - low sodium heart healthy   Complete by: As directed    Discharge instructions   Complete by: As directed    Take an additional 1 tablet (40 mg total) for 3 pound weight gain in 24 hours or 5 pounds in 1 week.   Increase activity slowly   Complete by: As directed  Medication List     TAKE these medications    acetaminophen 500 MG tablet Commonly known as: TYLENOL Take 500 mg by mouth 3 (three) times daily.   amLODipine 5 MG tablet Commonly known as: NORVASC TAKE 1 TABLET EVERY DAY What changed: when to take this   cinacalcet 30 MG tablet Commonly known as: SENSIPAR TAKE 1 TABLET EVERY DAY What changed: when to take this   Eliquis 5 MG Tabs tablet Generic drug: apixaban Take 1 tablet (5 mg total) by mouth 2 (two) times daily.   furosemide 40 MG tablet Commonly known as: LASIX TAKE 1 TABLET THREE TIMES WEEKLY What changed: See the new instructions.   latanoprost 0.005 % ophthalmic solution Commonly known as: XALATAN INSTILL 1 DROP INTO BOTH EYES AT BEDTIME   metoprolol tartrate 25 MG tablet Commonly known as: LOPRESSOR Take 0.5 tablets (12.5 mg total) by mouth 2 (two) times daily.   omeprazole 20 MG capsule Commonly known as: PRILOSEC TAKE 1 CAPSULE EVERY DAY   pravastatin 20 MG tablet Commonly known as: PRAVACHOL TAKE 1 TABLET EVERY DAY What changed: when to take this   vitamin  B-12 1000 MCG tablet Commonly known as: CYANOCOBALAMIN Take 1,000 mcg by mouth 2 (two) times a week. Tuesday and thursday   VITAMIN D PO Take 1,000 Units by mouth at bedtime.       Allergies  Allergen Reactions   Codeine Nausea And Vomiting      Procedures/Studies: DG Chest 2 View  Result Date: 12/19/2021 CLINICAL DATA:  Shortness of breath, cough. EXAM: CHEST - 2 VIEW COMPARISON:  November 12, 2019. FINDINGS: Stable cardiomediastinal silhouette. Mild bibasilar subsegmental atelectasis or scarring is noted. Probable small right pleural effusion is noted. Bony thorax is unremarkable. IMPRESSION: Mild bibasilar subsegmental atelectasis or scarring. Probable small right pleural effusion. Aortic Atherosclerosis (ICD10-I70.0). Electronically Signed   By: Marijo Conception M.D.   On: 12/19/2021 10:33   CT Chest W Contrast  Result Date: 12/20/2021 CLINICAL DATA:  Chronic dyspnea of unclear etiology. Shortness of breath and cough for 1 week. EXAM: CT CHEST WITH CONTRAST TECHNIQUE: Multidetector CT imaging of the chest was performed during intravenous contrast administration. RADIATION DOSE REDUCTION: This exam was performed according to the departmental dose-optimization program which includes automated exposure control, adjustment of the mA and/or kV according to patient size and/or use of iterative reconstruction technique. CONTRAST:  85mL OMNIPAQUE IOHEXOL 300 MG/ML  SOLN COMPARISON:  CT 11/12/2019.  Chest radiograph 12/19/2021 FINDINGS: Cardiovascular: Cardiac enlargement. No pericardial effusions. Normal caliber thoracic aorta. No aortic dissection. Aortic calcifications. Great vessel origins are patent. Persistent eccentric thrombus in right lower lobe subsegmental pulmonary artery. This represents residual chronic pulmonary embolus seen on prior study. Other emboli have resolved. Mediastinum/Nodes: Esophagus is decompressed. No significant lymphadenopathy. Thyroid gland is unremarkable.  Lungs/Pleura: Emphysematous changes throughout the lungs. Areas of linear scarring mostly in the left mid lung. New small right pleural effusion with basilar atelectasis or infiltration. This is possibly pneumonia. Right lower lobe pulmonary nodule measuring 5 mm diameter. No change since prior study. Additional smaller pulmonary nodules are also present without change. Upper Abdomen: No acute abnormalities demonstrated in the visualized upper abdomen. Musculoskeletal: Degenerative changes in the spine. IMPRESSION: 1. Small chronic pulmonary embolus demonstrated in the right lower lung this corresponds to acute pulmonary embolus seen on the previous study. 2. New right pleural effusion with basilar atelectasis or consolidation. 3. Emphysematous changes throughout the lungs. 4. Pulmonary nodules are unchanged since prior study. Stability  for greater than 2 years favors benign etiology. 5. Cardiac enlargement. Electronically Signed   By: Lucienne Capers M.D.   On: 12/20/2021 00:12      Subjective:   Discharge Exam:  Vitals:   12/21/21 0718 12/21/21 0948 12/21/21 1301 12/21/21 1550  BP: 129/88   (!) 141/93  Pulse: 73   89  Resp: 16   19  Temp: 98.4 F (36.9 C)   98 F (36.7 C)  TempSrc: Oral   Oral  SpO2: 98% 95% 93% 94%  Weight:      Height:        General exam: Elderly female, moderately built and nourished lying comfortably propped up in bed without distress. Respiratory system: clear to auscultation. No increased work of breathing. Cardiovascular system: S1 & S2 heard, irregularly irregular. No JVD, murmurs, rubs, gallops or clicks. Trace ankle edema, much improved than on admission.  Telemetry personally reviewed at bedside: A-fib with controlled ventricular rate. Gastrointestinal system: Abdomen is nondistended, soft and nontender. No organomegaly or masses felt. Normal bowel sounds heard. Central nervous system: Alert and oriented. No focal neurological deficits. Extremities:  Symmetric 5 x 5 power. Skin: No rashes, lesions or ulcers Psychiatry: Judgement and insight appear normal. Mood & affect appropriate.     The results of significant diagnostics from this hospitalization (including imaging, microbiology, ancillary and laboratory) are listed below for reference.     Microbiology: Recent Results (from the past 240 hour(s))  Resp Panel by RT-PCR (Flu A&B, Covid) Nasopharyngeal Swab     Status: None   Collection Time: 12/19/21 10:14 PM   Specimen: Nasopharyngeal Swab; Nasopharyngeal(NP) swabs in vial transport medium  Result Value Ref Range Status   SARS Coronavirus 2 by RT PCR NEGATIVE NEGATIVE Final    Comment: (NOTE) SARS-CoV-2 target nucleic acids are NOT DETECTED.  The SARS-CoV-2 RNA is generally detectable in upper respiratory specimens during the acute phase of infection. The lowest concentration of SARS-CoV-2 viral copies this assay can detect is 138 copies/mL. A negative result does not preclude SARS-Cov-2 infection and should not be used as the sole basis for treatment or other patient management decisions. A negative result may occur with  improper specimen collection/handling, submission of specimen other than nasopharyngeal swab, presence of viral mutation(s) within the areas targeted by this assay, and inadequate number of viral copies(<138 copies/mL). A negative result must be combined with clinical observations, patient history, and epidemiological information. The expected result is Negative.  Fact Sheet for Patients:  EntrepreneurPulse.com.au  Fact Sheet for Healthcare Providers:  IncredibleEmployment.be  This test is no t yet approved or cleared by the [place] FDA and  has been authorized for detection and/or diagnosis of SARS-CoV-2 by FDA under an Emergency Use Authorization (EUA). This EUA will remain  in effect (meaning this test can be used) for the duration of the COVID-19  declaration under Section 564(b)(1) of the Act, 21 U.S.C.section 360bbb-3(b)(1), unless the authorization is terminated  or revoked sooner.       Influenza A by PCR NEGATIVE NEGATIVE Final   Influenza B by PCR NEGATIVE NEGATIVE Final    Comment: (NOTE) The Xpert Xpress SARS-CoV-2/FLU/RSV plus assay is intended as an aid in the diagnosis of influenza from Nasopharyngeal swab specimens and should not be used as a sole basis for treatment. Nasal washings and aspirates are unacceptable for Xpert Xpress SARS-CoV-2/FLU/RSV testing.  Fact Sheet for Patients: EntrepreneurPulse.com.au  Fact Sheet for Healthcare Providers: IncredibleEmployment.be  This test is not yet approved or  cleared by the [place] and has been authorized for detection and/or diagnosis of SARS-CoV-2 by FDA under an Emergency Use Authorization (EUA). This EUA will remain in effect (meaning this test can be used) for the duration of the COVID-19 declaration under Section 564(b)(1) of the Act, 21 U.S.C. section 360bbb-3(b)(1), unless the authorization is terminated or revoked.  Performed at [facility], [place] [address] [place], [place] [address]      Labs: CBC: Recent Labs  Lab 12/19/21 1013  WBC 7.3  NEUTROABS 5.1  HGB 14.9  HCT 44.7  MCV 93.7  PLT 833    Basic Metabolic Panel: Recent Labs  Lab 12/19/21 1013 12/20/21 0553 12/21/21 0328  NA 138 141 139  K 4.0 3.3* 4.0  CL 102 101 99  CO2 24 29 31   GLUCOSE 106* 133* 98  BUN 14 12 19   CREATININE 1.07* 0.98 1.29*  CALCIUM 9.7 9.7 9.1  MG  --   --  1.4*    I discussed in detail with patient's daughter in law via phone, updated care and answered questions.  Time coordinating discharge: 35 minutes  SIGNED:  Vernell Leep, MD,  FACP, [facility], [facility] [place] [facility], [facility] (Care Management Physician Certified). [facility] Advisor  To contact the attending provider between 7A-7P or the covering  provider during after hours 7P-7A, please log into the web site [email or web address] and access using universal [place] password for that web site. If you do not have the password, please call the hospital operator.

## 2021-12-21 NOTE — Telephone Encounter (Signed)
Daughter-in-law wanted to make Dr. Gardiner Rhyme aware that patient was recently in the hospital.  If he could please take a look at her chart.  She was admitted on Monday her oxygen dropped down to 84%.  The doctor at the hospital told her there is nothing more they could do for her.  They are sending her home on oxygen.

## 2021-12-21 NOTE — Assessment & Plan Note (Addendum)
Due to Lasix diuresis Dc'ed IV Lasix. Expecting that it will return to normal.  Follow-up BMP as outpatient.

## 2021-12-21 NOTE — Progress Notes (Signed)
Mobility Specialist Progress Note:   12/21/21 1350  Mobility  Activity Ambulated with assistance in hallway  Level of Assistance Contact guard assist, steadying assist  Assistive Device Front wheel walker  Distance Ambulated (ft) 150 ft  Activity Response Tolerated well  $Mobility charge 1 Mobility    Pre Mobility: SpO2 90% During Mobility: SpO2 84%  Pt eager for ambulation, anxious about line management. Pt desat to 84% on RA during session, no c/o SOB. PT/student-PT to take over session before pt d/c. Ambulatory sat note to follow.   Nelta Numbers Mobility Specialist  Phone [phone]

## 2021-12-21 NOTE — Evaluation (Signed)
Physical Therapy Evaluation Patient Details Name: Annette [name] MRN: [id number] DOB: August 22, 1930 Today's Date: 12/21/2021  History of Present Illness  Patient is an 88 y/o F presenting on 12/19/21 with acute respiratory failure with hypoxia and SOB. Imaging showed small chronic PE. PMH includes anxiety, A-fib, CHF, bilateral LE edema and GERD.  Clinical Impression  Patient presents with the problem above with the deficits below. Patient began ambulating with mobility specialist with SpO2 at 90% on room air. Once PT present for eval and upon further ambulation, SpO2 dropped to 83% on room air. Patient placed on 2L O2 with nasal cannula and SpO2 >90% throughout remainder of session. RN and MD notified. Patient able to perform mobility tasks with supervision with a 2 wheeled rolling walker. PT recommending home health PT, home health RN for safety, and home health aide for assist with medication management. Per patient report, son able to assist at D/C. PT will continue to follow acutely as needed.     Recommendations for follow up therapy are one component of a multi-disciplinary discharge planning process, led by the attending physician.  Recommendations may be updated based on patient status, additional functional criteria and insurance authorization.  Follow Up Recommendations Home health PT (Home health RN for medication management and home health aide for assistance with ADLs.)    Assistance Recommended at Discharge Frequent or constant Supervision/Assistance  Patient can return home with the following  A little help with walking and/or transfers;A little help with bathing/dressing/bathroom;Assistance with cooking/housework;Direct supervision/assist for medications management;Assist for transportation;Help with stairs or ramp for entrance    Equipment Recommendations None recommended by PT  Recommendations for Other Services       Functional Status Assessment Patient has had a recent  decline in their functional status and demonstrates the ability to make significant improvements in function in a reasonable and predictable amount of time.     Precautions / Restrictions Precautions Precautions: Fall Restrictions Weight Bearing Restrictions: No      Mobility  Bed Mobility               General bed mobility comments: up in hallway with mobility specialist    Transfers Overall transfer level: Needs assistance Equipment used: Rolling walker (2 wheels) Transfers: Sit to/from Stand Sit to Stand: Supervision           General transfer comment: patient performed sit to stand and stand to sit transfers efficiently but needed cues for hand placement.    Ambulation/Gait Ambulation/Gait assistance: Supervision Gait Distance (Feet): 150 Feet Assistive device: Rolling walker (2 wheels) Gait Pattern/deviations: Decreased stride length, Knee flexed in stance - right, Knee flexed in stance - left, Trunk flexed, Narrow base of support Gait velocity: decreased Gait velocity interpretation: <1.8 ft/sec, indicate of risk for recurrent falls   General Gait Details: Patient needed VC's to correct forward flexed posture with walking. She verbally noted she was anxious about all the movement going on around her in the hallway and wanted to return to her room. Supervision and safety with line management  Stairs            Wheelchair Mobility    Modified Rankin (Stroke Patients Only)       Balance Overall balance assessment: Needs assistance Sitting-balance support: Feet supported Sitting balance-Leahy Scale: Good     Standing balance support: Bilateral upper extremity supported, Reliant on assistive device for balance Standing balance-Leahy Scale: Fair Standing balance comment: needed supervision for standing balance  Pertinent Vitals/Pain Pain Assessment Pain Assessment: No/denies pain    Home Living  Family/patient expects to be discharged to:: Private residence Living Arrangements: Children Available Help at Discharge: Available PRN/intermittently Type of Home: House Home Access: Ramped entrance       Home Layout: One level Home Equipment: Rollator (4 wheels);Grab bars - tub/shower;BSC/3in1 Additional Comments: Per patient report, she lives with her son and daughter in [place]. However, patient did say her son is not home most of the day. Per MD, patient lives alone.    Prior Function Prior Level of Function : Independent/Modified Independent             Mobility Comments: uses rollator for mobility       Hand Dominance   Dominant Hand: Right    Extremity/Trunk Assessment   Upper Extremity Assessment Upper Extremity Assessment: Generalized weakness    Lower Extremity Assessment Lower Extremity Assessment: Generalized weakness    Cervical / Trunk Assessment Cervical / Trunk Assessment: Kyphotic  Communication   Communication: No difficulties  Cognition Arousal/Alertness: Awake/alert Behavior During Therapy: Anxious Overall Cognitive Status: No family/caregiver present to determine baseline cognitive functioning                                 General Comments: anxiety noted throughout session. Noted some memory deficits.        General Comments      Exercises     Assessment/Plan    PT Assessment Patient needs continued PT services  PT Problem List Decreased strength;Decreased balance;Decreased mobility;Decreased coordination;Decreased safety awareness       PT Treatment Interventions Gait training;Functional mobility training;Therapeutic activities;Therapeutic exercise;Balance training;Neuromuscular re-education;Patient/family education    PT Goals (Current goals can be found in the Care Plan section)  Acute Rehab PT Goals Patient Stated Goal: to go home PT Goal Formulation: With patient Time For Goal Achievement: 01/04/22 Potential  to Achieve Goals: Good    Frequency Min 3X/week     Co-evaluation               AM-PAC PT "6 Clicks" Mobility  Outcome Measure Help needed turning from your back to your side while in a flat bed without using bedrails?: None Help needed moving from lying on your back to sitting on the side of a flat bed without using bedrails?: None Help needed moving to and from a bed to a chair (including a wheelchair)?: A Little Help needed standing up from a chair using your arms (e.g., wheelchair or bedside chair)?: A Little Help needed to walk in hospital room?: A Little Help needed climbing 3-5 steps with a railing? : A Lot 6 Click Score: 19    End of Session Equipment Utilized During Treatment: Oxygen (2L O2 during gait needed once in the hallway her SpO2 dropped.) Activity Tolerance: Patient tolerated treatment well Patient left: in chair;with call bell/phone within reach Nurse Communication: Mobility status;Other (comment) (2L O2 with activity - nasal cannula) PT Visit Diagnosis: Unsteadiness on feet (R26.81);Muscle weakness (generalized) (M62.81);Other abnormalities of gait and mobility (R26.89)    Time: 8676-7209 PT Time Calculation (min) (ACUTE ONLY): 21 min   Charges:   PT Evaluation $PT Eval Low Complexity: 1 Low          Jonne Ply, SPT  [place] 12/21/2021, 2:47 PM

## 2021-12-21 NOTE — Progress Notes (Signed)
Progress Note  Patient Name: Annette [name] Date of Encounter: 12/21/2021  [facility] Cardiologist: [name], MD   Subjective   88 yo  with acute respiratory failure, acute CHF Net diuresis of 3.0 liters so far this admission   Inpatient Medications    Scheduled Meds:  amLODipine  5 mg Oral QHS   apixaban  5 mg Oral BID   cinacalcet  30 mg Oral Q breakfast   latanoprost  1 drop Both Eyes QHS   metoprolol tartrate  12.5 mg Oral BID   pantoprazole  40 mg Oral Daily   pravastatin  20 mg Oral QHS   Continuous Infusions:  magnesium sulfate bolus IVPB     PRN Meds: acetaminophen **OR** acetaminophen, albuterol   Vital Signs    Vitals:   12/21/21 0351 12/21/21 0352 12/21/21 0718 12/21/21 0948  BP:  121/75 129/88   Pulse: 74 64 73   Resp: (!) 24 20 16    Temp:  97.7 F (36.5 C) 98.4 F (36.9 C)   TempSrc:  Oral Oral   SpO2: 97% 97% 98% 95%  Weight:      Height:        Intake/Output Summary (Last 24 hours) at 12/21/2021 1215 Last data filed at 12/21/2021 0941 Gross per 24 hour  Intake 480 ml  Output 250 ml  Net 230 ml   Last 3 Weights 12/21/2021 12/20/2021 12/05/2021  Weight (lbs) 150 lb 9.2 oz 149 lb 14.6 oz 152 lb 4 oz  Weight (kg) 68.3 kg 68 kg 69.06 kg      Telemetry     Off tele - Personally Reviewed  ECG     - Personally Reviewed  Physical Exam   GEN: No acute distress.   Neck: No JVD Cardiac: RRR,  soft systolic murmur  Respiratory: Clear to auscultation bilaterally. GI: Soft, nontender, non-distended  MS: No edema; No deformity. Neuro:  Nonfocal  Psych: Normal affect   Labs    High Sensitivity Troponin:   Recent Labs  Lab 12/19/21 2325  TROPONINIHS 15     Chemistry Recent Labs  Lab 12/19/21 1013 12/20/21 0553 12/21/21 0328  NA 138 141 139  K 4.0 3.3* 4.0  CL 102 101 99  CO2 24 29 31   GLUCOSE 106* 133* 98  BUN 14 12 19   CREATININE 1.07* 0.98 1.29*  CALCIUM 9.7 9.7 9.1  MG  --   --  1.4*  GFRNONAA 50* 55* 40*   ANIONGAP 12 11 9     Lipids No results for input(s): CHOL, TRIG, HDL, LABVLDL, LDLCALC, CHOLHDL in the last 168 hours.  Hematology Recent Labs  Lab 12/19/21 1013  WBC 7.3  RBC 4.77  HGB 14.9  HCT 44.7  MCV 93.7  MCH 31.2  MCHC 33.3  RDW 13.6  PLT 213   Thyroid No results for input(s): TSH, FREET4 in the last 168 hours.  BNP Recent Labs  Lab 12/19/21 2325  BNP 255.2*    DDimer No results for input(s): DDIMER in the last 168 hours.   Radiology    CT Chest W Contrast  Result Date: 12/20/2021 CLINICAL DATA:  Chronic dyspnea of unclear etiology. Shortness of breath and cough for 1 week. EXAM: CT CHEST WITH CONTRAST TECHNIQUE: Multidetector CT imaging of the chest was performed during intravenous contrast administration. RADIATION DOSE REDUCTION: This exam was performed according to the departmental dose-optimization program which includes automated exposure control, adjustment of the mA and/or kV according to patient size and/or  use of iterative reconstruction technique. CONTRAST:  60mL OMNIPAQUE IOHEXOL 300 MG/ML  SOLN COMPARISON:  CT 11/12/2019.  Chest radiograph 12/19/2021 FINDINGS: Cardiovascular: Cardiac enlargement. No pericardial effusions. Normal caliber thoracic aorta. No aortic dissection. Aortic calcifications. Great vessel origins are patent. Persistent eccentric thrombus in right lower lobe subsegmental pulmonary artery. This represents residual chronic pulmonary embolus seen on prior study. Other emboli have resolved. Mediastinum/Nodes: Esophagus is decompressed. No significant lymphadenopathy. Thyroid gland is unremarkable. Lungs/Pleura: Emphysematous changes throughout the lungs. Areas of linear scarring mostly in the left mid lung. New small right pleural effusion with basilar atelectasis or infiltration. This is possibly pneumonia. Right lower lobe pulmonary nodule measuring 5 mm diameter. No change since prior study. Additional smaller pulmonary nodules are also present  without change. Upper Abdomen: No acute abnormalities demonstrated in the visualized upper abdomen. Musculoskeletal: Degenerative changes in the spine. IMPRESSION: 1. Small chronic pulmonary embolus demonstrated in the right lower lung this corresponds to acute pulmonary embolus seen on the previous study. 2. New right pleural effusion with basilar atelectasis or consolidation. 3. Emphysematous changes throughout the lungs. 4. Pulmonary nodules are unchanged since prior study. Stability for greater than 2 years favors benign etiology. 5. Cardiac enlargement. Electronically Signed   By: Lucienne Capers M.D.   On: 12/20/2021 00:12    Cardiac Studies      Patient Profile     88 y.o. female  admitted with CHF  Assessment & Plan     Acute on chronic diastolic CHF.  She is much better. Has diuresed 3 liters.  She was eating a bit of extra salt.  Bacon, sausage. Ok to go home today .  Follow up with Dr. Gardiner Rhyme.   I gave her the OK to take an additional lasix if she starts to develop leg swelling   No additional recs.     For questions or updates, please contact [place] Please consult [email or web address] for contact info under        Signed, Mertie Moores, MD  12/21/2021, 12:15 PM

## 2021-12-21 NOTE — Progress Notes (Signed)
SATURATION QUALIFICATIONS: (This note is used to comply with regulatory documentation for home oxygen)  Patient Saturations on Room Air at Rest = 90%  Patient Saturations on Room Air while Ambulating = 83%  Patient Saturations on 2 Liters of oxygen while Ambulating = 94%  Please briefly explain why patient needs home oxygen: Pt requiring 2L to maintain adequate oxygen sats during functional mobility tasks.   Reuel Derby, PT, DPT  Acute Rehabilitation Services  Pager: [phone] Office: [phone]

## 2021-12-21 NOTE — Telephone Encounter (Signed)
Left message for DIL that I will route note to MD At time of call, patient still in hospital Advised would call back if MD had advice/suggestions

## 2021-12-21 NOTE — Discharge Instructions (Signed)

## 2021-12-21 NOTE — Progress Notes (Addendum)
0941: Patient reports she does not wear oxygen at baseline. SpO2 98% on 2L nasal cannula. Oxygen level decrease to 1L- SpO2 95%.  1301: Patient on room air. SpO2 93%

## 2021-12-21 NOTE — Assessment & Plan Note (Addendum)
Mg 1.4.  Replaced IV prior to discharge.  Follow as outpatient.

## 2021-12-22 ENCOUNTER — Inpatient Hospital Stay: Payer: Medicare HMO | Admitting: Family Medicine

## 2021-12-22 NOTE — Telephone Encounter (Signed)
Reviewed admission, recommend we schedule follow-up with myself or APP within the next month

## 2021-12-22 NOTE — Telephone Encounter (Signed)
Attempted to call patient- patient states that she is busy at the moment and will call back.

## 2021-12-25 ENCOUNTER — Telehealth: Payer: Self-pay

## 2021-12-25 ENCOUNTER — Telehealth: Payer: Self-pay | Admitting: Cardiology

## 2021-12-25 NOTE — Telephone Encounter (Signed)
Patient was calling to let dr Gardiner Rhyme know she was in the hospital and got put on oxygen. Patient was upset that she couldn't been seen in a week like the discharge papers says. Please advise

## 2021-12-25 NOTE — Progress Notes (Signed)
HPI: [name] is a 88 y.o. female with hx of  HTN, A-fib on Eliquis, PE in December 3016, chronic diastolic CHF, GERD, primary hyperparathyroidismwho is here today with her son to follow on recent hospitalization. She was evaluated in the ER  date of admission because hypoxia,SOB,orthopnea,and wheezing.O2 sats at home 84% at RA.  Transitional care management telephone call on 12/22/21 (attempt) and 12/25/2021.  She was admitted on 12/19/2021 and discharged home on 12/21/2021.  Chest CTA done on 12/20/21 showed small chronic PE in the right lower lung, new right pleural effusion with basilar atelectasis or consolidation, emphysematous changes throughout the lungs and pulmonary nodules unchanged since prior study. She was admitted for acute respiratory failure with hypoxia due to acute on chronic diastolic CHF and A-fib with mild RVR. She was treated with IV Lasix and cardiology was consulted (Dr Katharina Caper). BNP 255.  COVID-19 and flu test were negative.  She was discharged on continues O2 supplemental 2 LPM . O2 sat at home 97%. She needs a Rx for O2 portable tank. She has not had wheezing or cough since hospital discharge,SOB with activity has improved. Lab Results  Component Value Date   WBC 7.3 12/19/2021   HGB 14.9 12/19/2021   HCT 44.7 12/19/2021   MCV 93.7 12/19/2021   PLT 213 12/19/2021  Hypertension: Currently she is on amlodipine 5 mg daily. BP readings at home: Usually 120s/70s.  CHF: She is on Furosemide 40 mg 3 times per week. Echo done in 04/2021 LVEF 55 to 60%, mild MR.  Atrial fib on Eliquis 5 mg bid and metoprolol titrate 25 mg 1/2 tablet twice daily. In general she has tolerated medication well. Lower extremity edema reported as improved. Negative for CP,orthopnea, and PND.  PT and OT evaluation have not been done. HH has called to arrange appt and she is planning on calling back. She needs help with some ADL's: dressing,toilet,bathing,and transfer. She is using  her walker. Feeling fatigue since hospital discharge but gradually improving. Having daily bowel movements.  Cardiology follow up on 01/24/22. She had an appt on 01/03/22 but cancelled because  Recommended fluid intake:1.5  L per day. She is trying to do better with following low salt diet. No orthopnea or PND.  Hypomagnesemia: Magnesium was 1.4 on admission, replaced with IV magnesium. She is not on magnesium supplementation.  Acute kidney injury due to Lasix IV diuresis. Her son is measuring urine output, 60-70 oz yesterday. She has not noted gross hematuria or foam in urine.  Lab Results  Component Value Date   CREATININE 1.29 (H) 12/21/2021   BUN 19 12/21/2021   NA 139 12/21/2021   K 4.0 12/21/2021   CL 99 12/21/2021   CO2 31 12/21/2021   Review of Systems  Constitutional:  Positive for activity change and fatigue. Negative for appetite change and fever.  HENT:  Negative for mouth sores, nosebleeds and sore throat.   Eyes:  Negative for redness and visual disturbance.  Gastrointestinal:  Negative for abdominal pain, nausea and vomiting.       Negative for changes in bowel habits.  Genitourinary:  Negative for dysuria and frequency.  Musculoskeletal:  Positive for gait problem.  Skin:  Negative for rash and wound.  Neurological:  Negative for syncope, weakness and headaches.  Psychiatric/Behavioral:  Negative for confusion. The patient is nervous/anxious.   Rest see pertinent positives and negatives per HPI.  Current Outpatient Medications on File Prior to Visit  Medication Sig Dispense Refill   acetaminophen (  TYLENOL) 500 MG tablet Take 500 mg by mouth 3 (three) times daily.     amLODipine (NORVASC) 5 MG tablet TAKE 1 TABLET EVERY DAY (Patient taking differently: Take 5 mg by mouth at bedtime.) 90 tablet 3   apixaban (ELIQUIS) 5 MG TABS tablet Take 1 tablet (5 mg total) by mouth 2 (two) times daily. 60 tablet 0   cinacalcet (SENSIPAR) 30 MG tablet TAKE 1 TABLET EVERY DAY  (Patient taking differently: Take 30 mg by mouth.) 90 tablet 3   furosemide (LASIX) 40 MG tablet TAKE 1 TABLET THREE TIMES WEEKLY (Patient taking differently: Take 40 mg by mouth every Monday, Wednesday, and Friday.) 39 tablet 3   latanoprost (XALATAN) 0.005 % ophthalmic solution INSTILL 1 DROP INTO BOTH EYES AT BEDTIME (Patient taking differently: Place 1 drop into both eyes at bedtime.) 7.5 mL 3   metoprolol tartrate (LOPRESSOR) 25 MG tablet Take 0.5 tablets (12.5 mg total) by mouth 2 (two) times daily. 30 tablet 1   omeprazole (PRILOSEC) 20 MG capsule TAKE 1 CAPSULE EVERY DAY (Patient taking differently: Take 20 mg by mouth daily.) 90 capsule 3   pravastatin (PRAVACHOL) 20 MG tablet TAKE 1 TABLET EVERY DAY (Patient taking differently: Take 20 mg by mouth at bedtime.) 90 tablet 2   vitamin B-12 (CYANOCOBALAMIN) 1000 MCG tablet Take 1,000 mcg by mouth 2 (two) times a week. Tuesday and thursday     VITAMIN D PO Take 1,000 Units by mouth at bedtime.     No current facility-administered medications on file prior to visit.    Past Medical History:  Diagnosis Date   GERD 10/03/2007   HYPERTENSION 10/03/2007   OSTEOPENIA 02/03/2008   Primary hyperparathyroidism ([place]) 10/03/2007   Allergies  Allergen Reactions   Codeine Nausea And Vomiting    Social History   Socioeconomic History   Marital status: Widowed    Spouse name: Not on file   Number of children: Not on file   Years of education: Not on file   Highest education level: Not on file  Occupational History   Occupation: retired  Tobacco Use   Smoking status: Former    Types: Cigarettes    Quit date: 11/12/1978    Years since quitting: 43.1   Smokeless tobacco: Never  Substance and Sexual Activity   Alcohol use: No   Drug use: No   Sexual activity: Not on file  Other Topics Concern   Not on file  Social History Narrative   Pt states her son and daughter in law live with her   Social Determinants of Health   Financial  Resource Strain: Low Risk    Difficulty of Paying Living Expenses: Not hard at all  Food Insecurity: No Food Insecurity   Worried About Charity fundraiser in the Last Year: Never true   [place] in the Last Year: Never true  Transportation Needs: No Transportation Needs   Lack of Transportation (Medical): No   Lack of Transportation (Non-Medical): No  Physical Activity: Inactive   Days of Exercise per Week: 0 days   Minutes of Exercise per Session: 0 min  Stress: No Stress Concern Present   Feeling of Stress : Not at all  Social Connections: Moderately Isolated   Frequency of Communication with Friends and Family: More than three times a week   Frequency of Social Gatherings with Friends and Family: Never   Attends Religious Services: More than 4 times per year   Active Member of [employer]  or Organizations: No   Attends Archivist Meetings: Never   Marital Status: Widowed   Vitals:   12/26/21 0912  BP: 120/70  Pulse: 73  Resp: 16  SpO2: 97%   Body mass index is 27.1 kg/m.  Physical Exam Vitals and nursing note reviewed.  Constitutional:      General: She is not in acute distress.    Appearance: She is well-developed.  HENT:     Head: Normocephalic and atraumatic.     Mouth/Throat:     Mouth: Mucous membranes are moist.  Eyes:     Conjunctiva/sclera: Conjunctivae normal.  Cardiovascular:     Rate and Rhythm: Normal rate. Rhythm irregular.     Pulses:          Dorsalis pedis pulses are 2+ on the right side and 2+ on the left side.     Heart sounds: No murmur heard. Pulmonary:     Effort: Pulmonary effort is normal. No respiratory distress.     Breath sounds: Normal breath sounds.  Abdominal:     Palpations: Abdomen is soft.     Tenderness: There is no abdominal tenderness.  Musculoskeletal:     Right lower leg: 1+ Pitting Edema present.     Left lower leg: 1+ Pitting Edema present.  Lymphadenopathy:     Cervical: No cervical adenopathy.  Skin:     General: Skin is warm.     Findings: Ecchymosis (On forearms mainly around IV access.) present. No erythema or rash.  Neurological:     General: No focal deficit present.     Mental Status: She is alert and oriented to person, place, and time.     Cranial Nerves: No cranial nerve deficit.     Comments: Gait assisted with a walker.  Psychiatric:        Mood and Affect: Mood is anxious.     Comments: Well groomed, good eye contact.   ASSESSMENT AND PLAN:  [name] was seen today for hospitalization follow-up.  Diagnoses and all orders for this visit: Orders Placed This Encounter  Procedures   Basic metabolic panel   Magnesium   CBC   Lab Results  Component Value Date   WBC 5.5 12/26/2021   HGB 13.6 12/26/2021   HCT 41.6 12/26/2021   MCV 92.6 12/26/2021   PLT 184.0 12/26/2021   Lab Results  Component Value Date   CREATININE 0.93 12/26/2021   BUN 22 12/26/2021   NA 133 (L) 12/26/2021   K 5.1 12/26/2021   CL 95 (L) 12/26/2021   CO2 32 12/26/2021   AKI (acute kidney injury) ([place]) Continue adequate hydration, water ideally instead Gatorade; and low salt diet. Further recommendations according to BMP result.  Acute on chronic diastolic CHF (congestive heart failure) (HCC) Continue Furosemide 40 mg 3 times per week. Low salt diet. Metoprolol tartrate 25 mg 1/2 tab bid. Appt with cardio 01/24/22.  Essential hypertension BP adequately controlled. Continue same dose of Metoprolol succinate and Amlodipine. Low salt diet. Continue monitoring BP.  Hypomagnesemia 1.4 on 12/21/21. She is not on Mg supplementation. Further recommendations according to Mg result.  Persistent atrial fibrillation with rapid ventricular response (HCC) Rate controlled. Continue Metoprolol tartrate 25 mg 1/2 tab bid and Eliquis 5 mg bid. Some side effects of medications discussed. Continue monitoring HR at home. Keep next appt with cardiologist.  Acute respiratory failure with hypoxia  ([place]) Continue supplemental O2 at 2 LPM to keep O2 sats >89%. Order faxed to supplier.  Pending  appt for [facility] PT and OT evaluation. Rx for walker and bedside commode faxed.  Return in about 3 months (around 03/25/2022).  Ellanore Vanhook G. Martinique, MD  [facility]. [place] office.

## 2021-12-25 NOTE — Telephone Encounter (Signed)
Returned the call to the patient. She was calling about an appointment with Dr. Gardiner Rhyme. She currently has one on 3/15. She has an appointment on 2/14 with PCP   She had an appointment on 2/22 with Dr. Gardiner Rhyme that was canceled. She was upset that this was canceled because she did not want that appointment canceled. She stated that it took 7 months to get that appointment.   She has been advised that Dr. Gardiner Rhyme reviewed the hospital notes.

## 2021-12-25 NOTE — Telephone Encounter (Signed)
Transition Care Management Follow-up Telephone Call Date of discharge and from where: [facility] 12-21-21 Dx: acute respiratory failure with hypoxia How have you been since you were released from the hospital? Doing ok Any questions or concerns? No  Items Reviewed: Did the pt receive and understand the discharge instructions provided? Yes  Medications obtained and verified? Yes  Other? No  Any new allergies since your discharge? No  Dietary orders reviewed? No Do you have support at home? Yes   Home Care and Equipment/Supplies: Were home health services ordered? Yes- RN/PT/ Aide  If so, what is the name of the agency? [facility]  Has the agency set up a time to come to the patient's home? no Were any new equipment or medical supplies ordered?  Yes: oxygen What is the name of the medical supply agency? [facility] Were you able to get the supplies/equipment? yes Do you have any questions related to the use of the equipment or supplies? No  Functional Questionnaire: (I = Independent and D = Dependent) ADLs: D  Bathing/Dressing- D  Meal Prep- D  Eating- I  Maintaining continence- I  Transferring/Ambulation- D  Managing Meds- D  Follow up appointments reviewed:  PCP Hospital f/u appt confirmed? Yes  Scheduled to see Dr Martinique on 12-26-21 @ [facility] f/u appt confirmed? No  . Are transportation arrangements needed? No  If their condition worsens, is the pt aware to call PCP or go to the Emergency Dept.? Yes Was the patient provided with contact information for the PCP's office or ED? Yes Was to pt encouraged to call back with questions or concerns? Yes

## 2021-12-26 ENCOUNTER — Ambulatory Visit (INDEPENDENT_AMBULATORY_CARE_PROVIDER_SITE_OTHER): Payer: Medicare HMO | Admitting: Family Medicine

## 2021-12-26 ENCOUNTER — Encounter: Payer: Self-pay | Admitting: Family Medicine

## 2021-12-26 ENCOUNTER — Other Ambulatory Visit: Payer: Self-pay

## 2021-12-26 DIAGNOSIS — R2681 Unsteadiness on feet: Secondary | ICD-10-CM

## 2021-12-26 DIAGNOSIS — I10 Essential (primary) hypertension: Secondary | ICD-10-CM

## 2021-12-26 DIAGNOSIS — J9601 Acute respiratory failure with hypoxia: Secondary | ICD-10-CM

## 2021-12-26 DIAGNOSIS — I5033 Acute on chronic diastolic (congestive) heart failure: Secondary | ICD-10-CM | POA: Diagnosis not present

## 2021-12-26 DIAGNOSIS — I4819 Other persistent atrial fibrillation: Secondary | ICD-10-CM | POA: Diagnosis not present

## 2021-12-26 DIAGNOSIS — N179 Acute kidney failure, unspecified: Secondary | ICD-10-CM | POA: Diagnosis not present

## 2021-12-26 LAB — CBC
HCT: 41.6 % (ref 36.0–46.0)
Hemoglobin: 13.6 g/dL (ref 12.0–15.0)
MCHC: 32.8 g/dL (ref 30.0–36.0)
MCV: 92.6 fl (ref 78.0–100.0)
Platelets: 184 10*3/uL (ref 150.0–400.0)
RBC: 4.49 Mil/uL (ref 3.87–5.11)
RDW: 13.5 % (ref 11.5–15.5)
WBC: 5.5 10*3/uL (ref 4.0–10.5)

## 2021-12-26 NOTE — Patient Instructions (Addendum)
A few things to remember from today's visit:  Hypomagnesemia - Plan: Magnesium  AKI (acute kidney injury) ([place])  Acute respiratory failure with hypoxia ([place]) - Plan: Basic metabolic panel, CBC  Acute on chronic diastolic CHF (congestive heart failure) ([place])  If you need refills please call your pharmacy. Do not use My Chart to request refills or for acute issues that need immediate attention.   No changes today. Low salt diet. Continue fluid restriction. Continue monitoring blood pressure and heart rate. We will fax oxygen order.  Please be sure medication list is accurate. If a new problem present, please set up appointment sooner than planned today.

## 2021-12-26 NOTE — Telephone Encounter (Signed)
Please see other telephone encounter.

## 2021-12-26 NOTE — Assessment & Plan Note (Signed)
BP adequately controlled. Continue same dose of Metoprolol succinate and Amlodipine. Low salt diet. Continue monitoring BP.

## 2021-12-26 NOTE — Assessment & Plan Note (Signed)
Continue adequate hydration, water ideally instead Gatorade; and low salt diet. Further recommendations according to BMP result.

## 2021-12-26 NOTE — Assessment & Plan Note (Signed)
Rate controlled. Continue Metoprolol tartrate 25 mg 1/2 tab bid and Eliquis 5 mg bid. Some side effects of medications discussed. Continue monitoring HR at home. Keep next appt with cardiologist.

## 2021-12-26 NOTE — Telephone Encounter (Signed)
Spoke with pt, Follow up scheduled  

## 2021-12-26 NOTE — Assessment & Plan Note (Signed)
Continue Furosemide 40 mg 3 times per week. Low salt diet. Metoprolol tartrate 25 mg 1/2 tab bid. Appt with cardio 01/24/22.

## 2021-12-26 NOTE — Telephone Encounter (Signed)
We could add her on to my schedule at 9:20 on 2/24 if she is able

## 2021-12-26 NOTE — Assessment & Plan Note (Signed)
1.4 on 12/21/21. She is not on Mg supplementation. Further recommendations according to Mg result.

## 2021-12-26 NOTE — Assessment & Plan Note (Signed)
Continue supplemental O2 at 2 LPM to keep O2 sats >89%. Order faxed to supplier.

## 2021-12-27 ENCOUNTER — Telehealth: Payer: Self-pay

## 2021-12-27 LAB — BASIC METABOLIC PANEL
BUN: 22 mg/dL (ref 6–23)
CO2: 32 mEq/L (ref 19–32)
Calcium: 10.1 mg/dL (ref 8.4–10.5)
Chloride: 95 mEq/L — ABNORMAL LOW (ref 96–112)
Creatinine, Ser: 0.93 mg/dL (ref 0.40–1.20)
GFR: 54.62 mL/min — ABNORMAL LOW (ref >60.00)
Glucose, Bld: 88 mg/dL (ref 70–99)
Potassium: 5.1 mEq/L (ref 3.5–5.1)
Sodium: 133 mEq/L — ABNORMAL LOW (ref 135–145)

## 2021-12-27 LAB — MAGNESIUM: Magnesium: 1.8 mg/dL (ref 1.5–2.5)

## 2021-12-27 NOTE — Telephone Encounter (Signed)
Beth, RN with [facility] called in for nursing orders. 1x7 and Aide for 2x3 and 1x1 to help with bath & then re-eval. Verbal orders given.  Patient is also experiencing belching, wanted to know if omeprazole can be increased?

## 2021-12-28 ENCOUNTER — Telehealth: Payer: Self-pay | Admitting: Family Medicine

## 2021-12-28 NOTE — Telephone Encounter (Signed)
Annette [name] (Pt) with [facility] called in stating that there was a physical therapy evaluation performed. Clair Gulling recommends 1 week 1, 2 week 4, and 1 week 4.   Patient continues to have mild irregular heart rate.  Clair Gulling could be contacted at [phone].  Please advise.

## 2021-12-29 MED ORDER — OMEPRAZOLE 40 MG PO CPDR: 40.0000 mg | DELAYED_RELEASE_CAPSULE | Freq: Every day | ORAL | 3 refills | Status: DC

## 2021-12-29 NOTE — Telephone Encounter (Signed)
I left Annette [name] the verbal approval on his voicemail. Advised pt has a f/u with cardiology on 2/24.

## 2021-12-29 NOTE — Telephone Encounter (Signed)
Annette [name] is aware, new Rx sent in.

## 2021-12-29 NOTE — Addendum Note (Signed)
Addended by: Rodrigo Ran on: 12/29/2021 10:43 AM   Modules accepted: Orders

## 2021-12-29 NOTE — Telephone Encounter (Signed)
She can try Omeprazole 40 mg instead 20 mg daily. Avoid foods that may aggravate problem. Thanks, BJ

## 2022-01-01 ENCOUNTER — Inpatient Hospital Stay: Payer: Medicare HMO | Admitting: Family Medicine

## 2022-01-03 ENCOUNTER — Ambulatory Visit: Payer: Medicare HMO | Admitting: Cardiology

## 2022-01-04 NOTE — Progress Notes (Signed)
Cardiology Office Note:    Date:  01/07/2022   ID:  Annette [name], DOB 1930-09-13, MRN [id number]  PCP:  Martinique, [name], MD  Cardiologist:  [name], MD  Electrophysiologist:  None   Referring MD: Martinique, [name], MD   Chief Complaint  Patient presents with   Congestive Heart Failure    History of Present Illness:    Annette [name] is a 88 y.o. female with a hx of hypertension, PE, primary hyperparathyroidism who presents for follow-up.  She was referred by Dr. Martinique for evaluation of atrial fibrillation, initially seen on 03/24/2021.  From review of prior EKGs, was in sinus rhythm on EKG 08/18/2018 but has been A. fib since EKG on 11/12/2019.  She was admitted from 11/12/2019- 11/16/2019 with submassive PE.  She was started on heparin drip and transition to Eliquis.  Echocardiogram on 11/2019 showed normal biventricular function, no significant valvular disease.  She took Eliquis x 6 months then stopped.  Zio patch was prescribed for 3 days on 04/05/2021 but only wore for 9 hours, showed 100% A. fib burden with average rate 71 bpm.  Echocardiogram on 04/20/2021 showed normal biventricular function RVSP 35, mild to moderate TR, mild MR, PFO.  Zio patch x3 days 06/12/2021 showed 100% A-fib burden with average rate 75 bpm.  She was admitted 2/7 through 12/21/2021 with acute hypoxic respiratory failure.  SPO2 84% on presentation and in A-fib with RVR to 110s.  CT chest showed small chronic PE and right lower lobe, new right pleural effusion, emphysematous changes throughout the lungs.  She was diuresed with IV Lasix for suspected acute on chronic diastolic heart failure.  She was resumed on home Lasix 40 mg 3 times weekly on discharge.  Since last clinic visit, she reports she has been doing okay.  States that weight has been stable.  Had 1 episode of chest pain, lasted few seconds and resolved.  Reports occasional lightheadedness but no syncope.  Continues to have swelling in legs.   Denies any shortness of breath.  Reports compliance with Eliquis, has been having bruising but no bleeding.  Wt Readings from Last 3 Encounters:  01/05/22 152 lb 9.6 oz (69.2 kg)  12/26/21 153 lb (69.4 kg)  12/21/21 150 lb 9.2 oz (68.3 kg)      Past Medical History:  Diagnosis Date   GERD 10/03/2007   HYPERTENSION 10/03/2007   OSTEOPENIA 02/03/2008   Primary hyperparathyroidism ([place]) 10/03/2007    Past Surgical History:  Procedure Laterality Date   ABDOMINAL HYSTERECTOMY      Current Medications: Current Meds  Medication Sig   acetaminophen (TYLENOL) 500 MG tablet Take 500 mg by mouth 3 (three) times daily.   amLODipine (NORVASC) 5 MG tablet TAKE 1 TABLET EVERY DAY (Patient taking differently: Take 5 mg by mouth at bedtime.)   apixaban (ELIQUIS) 5 MG TABS tablet Take 1 tablet (5 mg total) by mouth 2 (two) times daily.   cinacalcet (SENSIPAR) 30 MG tablet TAKE 1 TABLET EVERY DAY (Patient taking differently: Take 30 mg by mouth.)   furosemide (LASIX) 40 MG tablet TAKE 1 TABLET THREE TIMES WEEKLY (Patient taking differently: Take 40 mg by mouth every Monday, Wednesday, and Friday.)   latanoprost (XALATAN) 0.005 % ophthalmic solution INSTILL 1 DROP INTO BOTH EYES AT BEDTIME (Patient taking differently: Place 1 drop into both eyes at bedtime.)   omeprazole (PRILOSEC) 40 MG capsule Take 1 capsule (40 mg total) by mouth daily.   OXYGEN Inhale into  the lungs.   pravastatin (PRAVACHOL) 20 MG tablet TAKE 1 TABLET EVERY DAY (Patient taking differently: Take 20 mg by mouth at bedtime.)   vitamin B-12 (CYANOCOBALAMIN) 1000 MCG tablet Take 1,000 mcg by mouth 2 (two) times a week. Tuesday and thursday   VITAMIN D PO Take 1,000 Units by mouth at bedtime.   [DISCONTINUED] metoprolol tartrate (LOPRESSOR) 25 MG tablet Take 0.5 tablets (12.5 mg total) by mouth 2 (two) times daily.     Allergies:   Codeine   Social History   Socioeconomic History   Marital status: Widowed    Spouse name: Not  on file   Number of children: Not on file   Years of education: Not on file   Highest education level: Not on file  Occupational History   Occupation: retired  Tobacco Use   Smoking status: Former    Types: Cigarettes    Quit date: 11/12/1978    Years since quitting: 43.1   Smokeless tobacco: Never  Substance and Sexual Activity   Alcohol use: No   Drug use: No   Sexual activity: Not on file  Other Topics Concern   Not on file  Social History Narrative   Pt states her son and daughter in law live with her   Social Determinants of Health   Financial Resource Strain: Low Risk    Difficulty of Paying Living Expenses: Not hard at all  Food Insecurity: No Food Insecurity   Worried About Charity fundraiser in the Last Year: Never true   [place] in the Last Year: Never true  Transportation Needs: No Transportation Needs   Lack of Transportation (Medical): No   Lack of Transportation (Non-Medical): No  Physical Activity: Inactive   Days of Exercise per Week: 0 days   Minutes of Exercise per Session: 0 min  Stress: No Stress Concern Present   Feeling of Stress : Not at all  Social Connections: Moderately Isolated   Frequency of Communication with Friends and Family: More than three times a week   Frequency of Social Gatherings with Friends and Family: Never   Attends Religious Services: More than 4 times per year   Active Member of [employer] or Organizations: No   Attends Archivist Meetings: Never   Marital Status: Widowed     Family History: The patient's family history is negative for Hypercalcemia.  ROS:   Please see the history of present illness.      All other systems reviewed and are negative.  EKGs/Labs/Other Studies Reviewed:    The following studies were reviewed today:  ECHO 04/20/2021  IMPRESSIONS    1. Left ventricular ejection fraction, by estimation, is 55 to 60%. The  left ventricle has normal function. The left ventricle has no  regional  wall motion abnormalities. There is mild left ventricular hypertrophy.  Left ventricular diastolic parameters  are indeterminate.   2. Right ventricular systolic function is low normal. The right  ventricular size is normal. There is mildly elevated pulmonary artery  systolic pressure. The estimated right ventricular systolic pressure is  09.7 mmHg.   3. Right atrial size was mildly dilated.   4. The mitral valve is normal in structure. Mild mitral valve  regurgitation. No evidence of mitral stenosis.   5. Tricuspid valve regurgitation is mild to moderate.   6. The aortic valve is tricuspid. Aortic valve regurgitation is not  visualized. Mild aortic valve sclerosis is present, with no evidence of  aortic valve  stenosis.   7. The inferior vena cava is normal in size with greater than 50%  respiratory variability, suggesting right atrial pressure of 3 mmHg.   8. Evidence of atrial level shunting detected by color flow Doppler.  There is a patent foramen ovale with predominantly left to right shunting  across the atrial septum.   LONG TERM MONITOR 04/05/2021  Study Highlights   Only wore monitor for 9 hours 100% A. fib burden, with average heart rate 71 bpm     Patch Wear Time:  0 days and 9 hours (2022-05-17T02:22:30-0400 to 2022-05-17T12:00:43-0400)   Atrial Fibrillation occurred continuously (100% burden), ranging from 56-105 bpm (avg of 71 bpm). Isolated VEs were rare (<1.0%), VE Couplets were rare (<1.0%), and no VE Triplets were present.   Echo 11/13/2019:   IMPRESSIONS    1. Left ventricular ejection fraction, by visual estimation, is 60 to  65%. The left ventricle has normal function. There is borderline left  ventricular hypertrophy.   2. Left ventricular diastolic parameters are indeterminate.   3. The left ventricle has no regional wall motion abnormalities.   4. Global right ventricle has normal systolic function.The right  ventricular size is mildly  enlarged. No increase in right ventricular wall  thickness.   5. Left atrial size was normal.   6. Right atrial size was normal.   7. The mitral valve is grossly normal. Mild mitral valve regurgitation.   8. The tricuspid valve is grossly normal.   9. The aortic valve is tricuspid. Aortic valve regurgitation is not  visualized.  10. The pulmonic valve was grossly normal. Pulmonic valve regurgitation is  trivial.  11. Mildly elevated pulmonary artery systolic pressure.  12. The tricuspid regurgitant velocity is 2.80 m/s, and with an assumed  right atrial pressure of 8 mmHg, the estimated right ventricular systolic  pressure is mildly elevated at 39.4 mmHg.  13. The inferior vena cava is dilated in size with >50% respiratory  variability, suggesting right atrial pressure of 8 mmHg.  14. Evidence of atrial level shunting detected by color flow Doppler.  EKG:   05/31/2021: atrial fibrillation, rate 105, PVCs 03/24/21: atrial fibrillation, rate 97, left axis deviation, poor R wave progression  Recent Labs: 12/05/2021: ALT 13 12/26/2021: Hemoglobin 13.6; Platelets 184.0 01/05/2022: BNP 684.9; BUN 14; Creatinine, Ser 0.92; Magnesium 1.6; Potassium 3.9; Sodium 137  Recent Lipid Panel    Component Value Date/Time   CHOL 184 05/19/2021 0834   TRIG 81.0 05/19/2021 0834   HDL 63.10 05/19/2021 0834   CHOLHDL 3 05/19/2021 0834   VLDL 16.2 05/19/2021 0834   LDLCALC 105 (H) 05/19/2021 0834   LDLDIRECT 121.2 06/17/2012 0931    Physical Exam:    VS:  BP 120/70 (BP Location: Left Arm, Patient Position: Sitting)    Pulse (!) 53    Ht 5\' 3"  (1.6 m)    Wt 152 lb 9.6 oz (69.2 kg)    SpO2 97%    BMI 27.03 kg/m     Wt Readings from Last 3 Encounters:  01/05/22 152 lb 9.6 oz (69.2 kg)  12/26/21 153 lb (69.4 kg)  12/21/21 150 lb 9.2 oz (68.3 kg)     GEN:  in no acute distress HEENT: Normal NECK: + JVD LYMPHATICS: No lymphadenopathy CARDIAC: Irregular, normal rate, no murmurs, rubs,  gallops RESPIRATORY:  Clear to auscultation without rales, wheezing or rhonchi  ABDOMEN: Soft, non-tender, non-distended MUSCULOSKELETAL: 1+edema SKIN: Warm and dry NEUROLOGIC:  Alert and oriented x 3 PSYCHIATRIC:  Normal affect   ASSESSMENT:    1. Permanent atrial fibrillation ([place])   2. Chronic diastolic heart failure ([place])   3. Chronic obstructive pulmonary disease, unspecified COPD type ([place])   4. Essential hypertension      PLAN:    Atrial fibrillation: Persistent versus chron chronic atrial fibrillation: Ic.  From review of prior EKGs, appears to have been in A. fib since her PE in January 2021.  CHA2DS2-VASc 5 (hypertension, age x2, PE, female).  Zio patch was prescribed for 3 days on 04/05/2021 but only wore for 9 hours, showed 100% A. fib burden with average rate 71 bpm.  Echocardiogram on 04/20/2021 showed normal biventricular function RVSP 35, mild to moderate TR, mild MR, PFO.  Zio patch x3 days 06/12/2021 showed 100% A-fib burden with average rate 75 bpm. -Continue Eliquis 5 mg twice daily -Rate controlled despite no AV nodal blocking agents on Zio.  Was started last month on metoprolol 12.5 mg twice daily.  Chronic diastolic heart failure: Echocardiogram on 04/20/2021 showed normal biventricular function RVSP 35, mild to moderate TR, mild MR, PFO.  On Lasix 40 mg 3 times weekly.  Admitted 12/2021 with acute on chronic diastolic heart failure, responded well to IV Lasix. -Appears mildly hypervolemic on exam today.  Check BMET, magnesium, BNP.  Pending lab results, will likely need to increase Lasix to 40 mg daily  Chronic hypoxic respiratory failure: Requiring home O2 since recent hospitalization.  Likely multifactorial with diastolic heart failure contributing, but also with small chronic PE and emphysematous changes to the lungs on CT chest.  Referred to pulmonology  Hypertension: On losartan 25 mg daily and amlodipine 5 mg daily.  Appears controlled  RTC in 3  months  Medication Adjustments/Labs and Tests Ordered: Current medicines are reviewed at length with the patient today.  Concerns regarding medicines are outlined above.  Orders Placed This Encounter  Procedures   Basic metabolic panel   Magnesium   Brain natriuretic peptide   Ambulatory referral to Pulmonology    No orders of the defined types were placed in this encounter.    Patient Instructions  Medication Instructions:  STOP Metoprolol   *If you need a refill on your cardiac medications before your next appointment, please call your pharmacy*   Lab Work: BMET, MAG, BNP today   If you have labs (blood work) drawn today and your tests are completely normal, you will receive your results only by: [place] (if you have MyChart) OR A paper copy in the mail If you have any lab test that is abnormal or we need to change your treatment, we will call you to review the results.   Follow-Up: At [facility], you and your health needs are our priority.  As part of our continuing mission to provide you with exceptional heart care, we have created designated Provider Care Teams.  These Care Teams include your primary Cardiologist (physician) and Advanced Practice Providers (APPs -  Physician Assistants and Nurse Practitioners) who all work together to provide you with the care you need, when you need it.  We recommend signing up for the patient portal called "MyChart".  Sign up information is provided on this After Visit Summary.  MyChart is used to connect with patients for Virtual Visits (Telemedicine).  Patients are able to view lab/test results, encounter notes, upcoming appointments, etc.  Non-urgent messages can be sent to your provider as well.   To learn more about what you can do with MyChart, go to  [email or web address].    Your next appointment:   3 month(s)  The format for your next appointment:   In Person  Provider:   Sande Rives, PA-C or Almyra Deforest,  PA-C or Caron Presume, PA-C    Come back to see Dr.Deannie Resetar in 6 months.  Other Instructions Referral to Pulmonary       Signed, [name], MD  01/07/2022 10:49 PM    [facility]

## 2022-01-05 ENCOUNTER — Ambulatory Visit: Payer: Medicare HMO | Admitting: Cardiology

## 2022-01-05 ENCOUNTER — Other Ambulatory Visit: Payer: Self-pay

## 2022-01-05 VITALS — BP 120/70 | HR 53 | Ht 63.0 in | Wt 152.6 lb

## 2022-01-05 DIAGNOSIS — I5032 Chronic diastolic (congestive) heart failure: Secondary | ICD-10-CM

## 2022-01-05 DIAGNOSIS — J449 Chronic obstructive pulmonary disease, unspecified: Secondary | ICD-10-CM

## 2022-01-05 DIAGNOSIS — I10 Essential (primary) hypertension: Secondary | ICD-10-CM | POA: Diagnosis not present

## 2022-01-05 DIAGNOSIS — I4821 Permanent atrial fibrillation: Secondary | ICD-10-CM | POA: Diagnosis not present

## 2022-01-05 NOTE — Patient Instructions (Addendum)
Medication Instructions:  STOP Metoprolol   *If you need a refill on your cardiac medications before your next appointment, please call your pharmacy*   Lab Work: BMET, MAG, BNP today   If you have labs (blood work) drawn today and your tests are completely normal, you will receive your results only by: [place] (if you have MyChart) OR A paper copy in the mail If you have any lab test that is abnormal or we need to change your treatment, we will call you to review the results.   Follow-Up: At [facility], you and your health needs are our priority.  As part of our continuing mission to provide you with exceptional heart care, we have created designated Provider Care Teams.  These Care Teams include your primary Cardiologist (physician) and Advanced Practice Providers (APPs -  Physician Assistants and Nurse Practitioners) who all work together to provide you with the care you need, when you need it.  We recommend signing up for the patient portal called "MyChart".  Sign up information is provided on this After Visit Summary.  MyChart is used to connect with patients for Virtual Visits (Telemedicine).  Patients are able to view lab/test results, encounter notes, upcoming appointments, etc.  Non-urgent messages can be sent to your provider as well.   To learn more about what you can do with MyChart, go to [email or web address].    Your next appointment:   3 month(s)  The format for your next appointment:   In Person  Provider:   Sande Rives, PA-C or Almyra Deforest, PA-C or Caron Presume, PA-C    Come back to see Dr.Schumann in 6 months.  Other Instructions Referral to Pulmonary

## 2022-01-06 LAB — BASIC METABOLIC PANEL
BUN/Creatinine Ratio: 15 (ref 12–28)
BUN: 14 mg/dL (ref 8–27)
CO2: 27 mmol/L (ref 20–29)
Calcium: 9.5 mg/dL (ref 8.7–10.3)
Chloride: 95 mmol/L — ABNORMAL LOW (ref 96–106)
Creatinine, Ser: 0.92 mg/dL (ref 0.57–1.00)
Glucose: 85 mg/dL (ref 70–99)
Potassium: 3.9 mmol/L (ref 3.5–5.2)
Sodium: 137 mmol/L (ref 134–144)
eGFR: 60 mL/min/{1.73_m2} (ref >59)

## 2022-01-06 LAB — BRAIN NATRIURETIC PEPTIDE: BNP: 684.9 pg/mL — ABNORMAL HIGH (ref 0.0–100.0)

## 2022-01-06 LAB — MAGNESIUM: Magnesium: 1.6 mg/dL (ref 1.6–2.3)

## 2022-01-08 ENCOUNTER — Telehealth: Payer: Self-pay | Admitting: *Deleted

## 2022-01-08 DIAGNOSIS — I5032 Chronic diastolic (congestive) heart failure: Secondary | ICD-10-CM

## 2022-01-08 DIAGNOSIS — R790 Abnormal level of blood mineral: Secondary | ICD-10-CM

## 2022-01-08 MED ORDER — MAGNESIUM OXIDE 400 MG PO CAPS: 400.0000 mg | ORAL_CAPSULE | Freq: Every day | ORAL | 3 refills | Status: DC

## 2022-01-08 MED ORDER — FUROSEMIDE 40 MG PO TABS: 40.0000 mg | ORAL_TABLET | Freq: Every day | ORAL | 3 refills | Status: DC

## 2022-01-08 NOTE — Telephone Encounter (Signed)
Spoke with pt, aware of dr schumann's recommendations.  New script sent to the pharmacy  Lab orders mailed to the pt

## 2022-01-08 NOTE — Telephone Encounter (Signed)
-----   Message from [name], MD sent at 01/07/2022  6:08 PM EST ----- Stable kidney function.  Worsening BNP suggesting volume overload.  Recommend switching from Lasix 40 mg 3 times weekly to 40 mg daily.  Magnesium low, would also start magnesium oxide 400 mg daily.  Repeat BMET, magnesium in 1 week

## 2022-01-22 ENCOUNTER — Telehealth: Payer: Self-pay | Admitting: Family Medicine

## 2022-01-22 DIAGNOSIS — I5032 Chronic diastolic (congestive) heart failure: Secondary | ICD-10-CM | POA: Diagnosis not present

## 2022-01-22 DIAGNOSIS — R790 Abnormal level of blood mineral: Secondary | ICD-10-CM | POA: Diagnosis not present

## 2022-01-22 LAB — BASIC METABOLIC PANEL
BUN/Creatinine Ratio: 22 (ref 12–28)
BUN: 19 mg/dL (ref 8–27)
CO2: 27 mmol/L (ref 20–29)
Calcium: 10.4 mg/dL — ABNORMAL HIGH (ref 8.7–10.3)
Chloride: 96 mmol/L (ref 96–106)
Creatinine, Ser: 0.86 mg/dL (ref 0.57–1.00)
Glucose: 111 mg/dL — ABNORMAL HIGH (ref 70–99)
Potassium: 4.3 mmol/L (ref 3.5–5.2)
Sodium: 138 mmol/L (ref 134–144)
eGFR: 65 mL/min/{1.73_m2} (ref >59)

## 2022-01-22 LAB — MAGNESIUM: Magnesium: 1.8 mg/dL (ref 1.6–2.3)

## 2022-01-22 NOTE — Telephone Encounter (Signed)
I spoke with patient; Annette [name] is needed approval from pcp for oxygen rental. I will fill the form out & fax it in. I also sent a message to [place] at [employer] to make sure that was all that would be needed. ?

## 2022-01-22 NOTE — Telephone Encounter (Signed)
Patient called in requesting a call back. The call will be about [facility] requesting a PA to rent oxygen from [place]. She said she would like a call back to explain more. ? ?The fax number is [phone]. ? ?Please advise. ? ?

## 2022-01-22 NOTE — Telephone Encounter (Signed)
Form filled out & faxed in.  ?

## 2022-01-23 ENCOUNTER — Telehealth: Payer: Self-pay | Admitting: Family Medicine

## 2022-01-23 NOTE — Telephone Encounter (Signed)
I called and spoke with patient. I informed her that we will keep the [employer] oxygen for now until we hear a response from [facility]. I let her know that [employer] considers [employer] out of network (which would cost more.) I let her know that no one will take the oxygen from her until we have a replacement for her. Patient verbalized understanding & I made her aware that I would let her know as soon as I heard anything else. Pt thanked me for my call. ?

## 2022-01-23 NOTE — Telephone Encounter (Signed)
I spoke with pt's insurance; they no longer cover [place]. I filled out a new form & faxed it in for a new company. They will deliver pt new oxygen & we will have [employer] pick up their equipment. ? ?Patient & her son are aware.  ?

## 2022-01-23 NOTE — Telephone Encounter (Signed)
Juliann Mule PT with [facility] is  calling to let md know pt cancel her PT appt today due to panic attack because her insurance will not longer pay for her oxygent. Pt is sch for another PT appt this week ?

## 2022-01-24 ENCOUNTER — Ambulatory Visit: Payer: Medicare HMO | Admitting: Cardiology

## 2022-01-24 NOTE — Telephone Encounter (Signed)
Pt is calling to let sarah know [employer] gave her walker and bedside commode and she is concerning they are not in her network and may come get DME ?

## 2022-01-24 NOTE — Telephone Encounter (Signed)
I called and spoke with patient. I advised her that she would keep those items until we find out the new company that [facility] wants her to use. I have [employer] looking into this as well.  ?

## 2022-01-25 ENCOUNTER — Telehealth: Payer: Self-pay | Admitting: Family Medicine

## 2022-01-25 NOTE — Telephone Encounter (Signed)
Clair Gulling from [facility] call and stated pt have pain when exercising and only taken tylenol  he want  to know what are the lower strength  alterative she can have oral or topical .Clair Gulling number is [phone]. ?

## 2022-01-26 ENCOUNTER — Encounter: Payer: Self-pay | Admitting: Family Medicine

## 2022-01-26 NOTE — Telephone Encounter (Signed)
I called and spoke with Clair Gulling, verbal given on medications.  ?

## 2022-01-26 NOTE — Telephone Encounter (Signed)
She can have Tylenol 500 mg 3-4 times per day as needed. If having joint pain she can apply voltaren gel 3 times per day as needed. If having back pain, she can try icy hot or asper cream on area.  ?Low impact exercise, if PT is causing pain, intensity can be decreased. ?Thanks, ?BJ ?

## 2022-02-02 ENCOUNTER — Telehealth: Payer: Self-pay | Admitting: Family Medicine

## 2022-02-02 NOTE — Telephone Encounter (Signed)
Pt call and stated she want Sarah to give her a call about the oxygen she have. ?

## 2022-02-06 ENCOUNTER — Ambulatory Visit (INDEPENDENT_AMBULATORY_CARE_PROVIDER_SITE_OTHER): Payer: Medicare HMO | Admitting: Endocrinology

## 2022-02-06 ENCOUNTER — Ambulatory Visit: Payer: Medicare HMO | Admitting: Endocrinology

## 2022-02-06 ENCOUNTER — Other Ambulatory Visit: Payer: Self-pay

## 2022-02-06 VITALS — BP 126/92 | HR 50 | Ht 63.0 in | Wt 143.2 lb

## 2022-02-06 DIAGNOSIS — E210 Primary hyperparathyroidism: Secondary | ICD-10-CM

## 2022-02-06 NOTE — Telephone Encounter (Signed)
I called and spoke with patient. She will reach out to [employer] to set up her payment plan for the items that she has.  ?

## 2022-02-06 NOTE — Progress Notes (Signed)
? ?Subjective:  ? ? Patient ID: Annette [name], female    DOB: [birth year], 88 y.o.   MRN: [id number] ? ?HPI ?Pt returns for f/u of primary hyperparathyroidism (dx'ed 2008; she has never had osteoporosis or urolithiasis; only bony fracture was right wrist (1988, with an injury)). Due to multiple med probs and advanced age, she is not a surgical candidate; she takes cinacalcet and Vit-D, 1000 units/d; DEXA was normal in 2012).  No change in chronic sob.   ?Past Medical History:  ?Diagnosis Date  ? GERD 10/03/2007  ? HYPERTENSION 10/03/2007  ? OSTEOPENIA 02/03/2008  ? Primary hyperparathyroidism ([place]) 10/03/2007  ? ? ?Past Surgical History:  ?Procedure Laterality Date  ? ABDOMINAL HYSTERECTOMY    ? ? ?Social History  ? ?Socioeconomic History  ? Marital status: Widowed  ?  Spouse name: Not on file  ? Number of children: Not on file  ? Years of education: Not on file  ? Highest education level: Not on file  ?Occupational History  ? Occupation: retired  ?Tobacco Use  ? Smoking status: Former  ?  Types: Cigarettes  ?  Quit date: 11/12/1978  ?  Years since quitting: 43.2  ? Smokeless tobacco: Never  ?Substance and Sexual Activity  ? Alcohol use: No  ? Drug use: No  ? Sexual activity: Not on file  ?Other Topics Concern  ? Not on file  ?Social History Narrative  ? Pt states her son and daughter in law live with her  ? ?Social Determinants of Health  ? ?Financial Resource Strain: Low Risk   ? Difficulty of Paying Living Expenses: Not hard at all  ?Food Insecurity: No Food Insecurity  ? Worried About Charity fundraiser in the Last Year: Never true  ? Ran Out of Food in the Last Year: Never true  ?Transportation Needs: No Transportation Needs  ? Lack of Transportation (Medical): No  ? Lack of Transportation (Non-Medical): No  ?Physical Activity: Inactive  ? Days of Exercise per Week: 0 days  ? Minutes of Exercise per Session: 0 min  ?Stress: No Stress Concern Present  ? Feeling of Stress : Not at all  ?Social Connections:  Moderately Isolated  ? Frequency of Communication with Friends and Family: More than three times a week  ? Frequency of Social Gatherings with Friends and Family: Never  ? Attends Religious Services: More than 4 times per year  ? Active Member of Clubs or Organizations: No  ? Attends Archivist Meetings: Never  ? Marital Status: Widowed  ?Intimate Partner Violence: Not At Risk  ? Fear of Current or Ex-Partner: No  ? Emotionally Abused: No  ? Physically Abused: No  ? Sexually Abused: No  ? ? ?Current Outpatient Medications on File Prior to Visit  ?Medication Sig Dispense Refill  ? acetaminophen (TYLENOL) 500 MG tablet Take 500 mg by mouth 3 (three) times daily.    ? amLODipine (NORVASC) 5 MG tablet TAKE 1 TABLET EVERY DAY (Patient taking differently: Take 5 mg by mouth at bedtime.) 90 tablet 3  ? apixaban (ELIQUIS) 5 MG TABS tablet Take 1 tablet (5 mg total) by mouth 2 (two) times daily. 60 tablet 0  ? cinacalcet (SENSIPAR) 30 MG tablet TAKE 1 TABLET EVERY DAY (Patient taking differently: Take 30 mg by mouth.) 90 tablet 3  ? furosemide (LASIX) 40 MG tablet Take 1 tablet (40 mg total) by mouth daily. 90 tablet 3  ? latanoprost (XALATAN) 0.005 % ophthalmic solution INSTILL 1 DROP  INTO BOTH EYES AT BEDTIME (Patient taking differently: Place 1 drop into both eyes at bedtime.) 7.5 mL 3  ? Magnesium Oxide 400 MG CAPS Take 1 capsule (400 mg total) by mouth daily. 90 capsule 3  ? omeprazole (PRILOSEC) 40 MG capsule Take 1 capsule (40 mg total) by mouth daily. 90 capsule 3  ? OXYGEN Inhale into the lungs.    ? pravastatin (PRAVACHOL) 20 MG tablet TAKE 1 TABLET EVERY DAY (Patient taking differently: Take 20 mg by mouth at bedtime.) 90 tablet 2  ? vitamin B-12 (CYANOCOBALAMIN) 1000 MCG tablet Take 1,000 mcg by mouth 2 (two) times a week. Tuesday and thursday    ? VITAMIN D PO Take 1,000 Units by mouth at bedtime.    ? ?No current facility-administered medications on file prior to visit.  ? ? ?Allergies  ?Allergen  Reactions  ? Codeine Nausea And Vomiting  ? ? ?Family History  ?Problem Relation Age of Onset  ? Hypercalcemia Neg Hx   ? ? ?BP (!) 126/92 (BP Location: Left Arm, Patient Position: Sitting, Cuff Size: Normal)   Pulse (!) 50   Ht '5\' 3"'$  (1.6 m)   Wt 143 lb 3.2 oz (65 kg)   SpO2 94%   BMI 25.37 kg/m?  ? ? ?Review of Systems ? ?   ?Objective:  ? Physical Exam ?VITAL SIGNS:  See vs page.   ?GENERAL: no distress.   ? ? ? ?Lab Results  ?Component Value Date  ? PTH 119 (H) 10/09/2021  ? CALCIUM 10.4 (H) 01/22/2022  ? ? ?   ?Assessment & Plan:  ?Primary hyperparathyroidism: uncontrolled.  Due to advanced age, I told pt I favor not increasing cinacalcet, so please continue the same amount.  ? ?

## 2022-02-06 NOTE — Patient Instructions (Addendum)
Please continue the same cinacalcet.   ?Please come back for a follow-up appointment in 4 months.   ?

## 2022-02-08 ENCOUNTER — Ambulatory Visit: Payer: Medicare HMO | Admitting: Pulmonary Disease

## 2022-02-08 ENCOUNTER — Encounter: Payer: Self-pay | Admitting: Pulmonary Disease

## 2022-02-08 VITALS — BP 122/60 | HR 94 | Ht 63.0 in | Wt 143.6 lb

## 2022-02-08 DIAGNOSIS — J432 Centrilobular emphysema: Secondary | ICD-10-CM | POA: Diagnosis not present

## 2022-02-08 MED ORDER — SPIRIVA RESPIMAT 2.5 MCG/ACT IN AERS: 2.0000 | INHALATION_SPRAY | Freq: Every day | RESPIRATORY_TRACT | 0 refills | Status: DC

## 2022-02-08 MED ORDER — SPIRIVA RESPIMAT 2.5 MCG/ACT IN AERS: 2.0000 | INHALATION_SPRAY | Freq: Every day | RESPIRATORY_TRACT | 5 refills | Status: DC

## 2022-02-08 NOTE — Progress Notes (Signed)
? ? ?Subjective:  ? ?PATIENT ID: Annette [name] DOB: Apr 10, 1930, MRN: [id number] ? ? ?HPI ? ?Chief Complaint  ?Patient presents with  ? Consult  ?  Pt unaware she's here for Copd referral  ? ? ?Reason for Visit: New consult for emphysema ? ?Annette [name] is a 88 year old [name] with chronic hypoxemic respiratory failure, chronic diastolic heart failure, atrial fibrillation, chronic pulmonary embolism dx 11/12/19, hypertension, GERD, history of pulmonary nodules and primary hyperparathyroidism who presents as a new consult for COPD. Referred by Dr. Gardiner Rhyme. Her son Elta Guadeloupe is present with her and provides additional history. ? ?She was seen by cardiology on 01/05/2022 with Dr. Gardiner Rhyme.  Has been diuresed for acute on chronic diastolic heart failure and was noted to be hypervolemic on exam.  Of note she was recently hospitalized in February for this issue and had CT chest completed.  Since her hospitalization she has continued to require home oxygen.  With her recent CT demonstrating emphysema and small chronic PE, she was referred to pulmonary for further evaluation. ? ?She is active and able to care for herself. Describes herself as sedentary but able to ambulate independently with walker. She has shortness of breath and wheezing related to her heart issues. This has improved but will still feel short of breath with minimal activity. Has been compliant with her oxygen at rest and with activity, 2L via [place]. She is anxious about taking off her oxygen. ? ?Social History: ?Quit smoking 50 years ago however describes herself as social smoker. She is unclear why she has COPD ?Second hand exposure ? ?I have personally reviewed patient's past medical/family/social history, allergies, current medications. ? ?Past Medical History:  ?Diagnosis Date  ? GERD 10/03/2007  ? HYPERTENSION 10/03/2007  ? OSTEOPENIA 02/03/2008  ? Primary hyperparathyroidism ([place]) 10/03/2007  ?  ? ?Family History  ?Problem Relation Age  of Onset  ? Hypercalcemia Neg Hx   ?  ? ?Social History  ? ?Occupational History  ? Occupation: retired  ?Tobacco Use  ? Smoking status: Former  ?  Types: Cigarettes  ?  Quit date: 11/12/1978  ?  Years since quitting: 43.2  ? Smokeless tobacco: Never  ?Substance and Sexual Activity  ? Alcohol use: No  ? Drug use: No  ? Sexual activity: Not on file  ? ? ?Allergies  ?Allergen Reactions  ? Codeine Nausea And Vomiting  ?  ? ?Outpatient Medications Prior to Visit  ?Medication Sig Dispense Refill  ? acetaminophen (TYLENOL) 500 MG tablet Take 500 mg by mouth 3 (three) times daily.    ? amLODipine (NORVASC) 5 MG tablet TAKE 1 TABLET EVERY DAY (Patient taking differently: Take 5 mg by mouth at bedtime.) 90 tablet 3  ? apixaban (ELIQUIS) 5 MG TABS tablet Take 1 tablet (5 mg total) by mouth 2 (two) times daily. 60 tablet 0  ? cinacalcet (SENSIPAR) 30 MG tablet TAKE 1 TABLET EVERY DAY (Patient taking differently: Take 30 mg by mouth.) 90 tablet 3  ? furosemide (LASIX) 40 MG tablet Take 1 tablet (40 mg total) by mouth daily. 90 tablet 3  ? latanoprost (XALATAN) 0.005 % ophthalmic solution INSTILL 1 DROP INTO BOTH EYES AT BEDTIME (Patient taking differently: Place 1 drop into both eyes at bedtime.) 7.5 mL 3  ? Magnesium Oxide 400 MG CAPS Take 1 capsule (400 mg total) by mouth daily. 90 capsule 3  ? omeprazole (PRILOSEC) 40 MG capsule Take 1 capsule (40 mg total) by mouth daily. 90 capsule 3  ?  OXYGEN Inhale into the lungs.    ? pravastatin (PRAVACHOL) 20 MG tablet TAKE 1 TABLET EVERY DAY (Patient taking differently: Take 20 mg by mouth at bedtime.) 90 tablet 2  ? vitamin B-12 (CYANOCOBALAMIN) 1000 MCG tablet Take 1,000 mcg by mouth 2 (two) times a week. Tuesday and thursday    ? VITAMIN D PO Take 1,000 Units by mouth at bedtime.    ? ?No facility-administered medications prior to visit.  ? ? ?Review of Systems  ?Constitutional:  Negative for chills, diaphoresis, fever, malaise/fatigue and weight loss.  ?HENT:  Negative for  congestion.   ?Respiratory:  Positive for shortness of breath. Negative for cough, hemoptysis, sputum production and wheezing.   ?Cardiovascular:  Negative for chest pain, palpitations and leg swelling.  ?Psychiatric/Behavioral:  The patient is nervous/anxious.   ? ? ?Objective:  ? ?Vitals:  ? 02/08/22 0910  ?BP: 122/60  ?Pulse: 94  ?SpO2: 97%  ?Weight: 143 lb 9.6 oz (65.1 kg)  ?Height: '5\' 3"'$  (1.6 m)  ? ?SpO2: 97 % ?O2 Device: Nasal cannula ?O2 Flow Rate (L/min): 2 L/min ?O2 Type: Continuous O2 ? ?Physical Exam: ?General: Well-appearing, no acute distress ?HENT: [place], AT ?Eyes: EOMI, no scleral icterus ?Respiratory: Clear to auscultation bilaterally.  No crackles, wheezing or rales ?Cardiovascular: RRR, -M/R/G, no JVD ?Extremities:-Edema,-tenderness ?Neuro: AAO x4, CNII-XII grossly intact ?Psych: Normal mood, normal affect ? ?Data Reviewed: ? ?Imaging: ?CTA 11/12/2019-acute PE in the right lung distal main pulmonary artery extending to subsegmental branches.  Subsegmental PE in the left upper and lower lobes.  2 pulmonary nodules in the right lung measuring 5 mm.  Linear scarring in the superior segment of the left lower lobe ?CT chest 12/19/2021-small chronic pulmonary emboli lower lobe corresponding to PE and 11/12/2019.  Emphysema.  Small right pleural effusion with atelectasis.  Right lower lobe nodule measuring 5 mm, unchanged. Similar left lung scarring seen compared to 2020. ? ?PFT: ?None on file ? ?Labs: ?CBC ?   ?Component Value Date/Time  ? WBC 5.5 12/26/2021 1009  ? RBC 4.49 12/26/2021 1009  ? HGB 13.6 12/26/2021 1009  ? HGB 15.1 03/24/2021 1119  ? HCT 41.6 12/26/2021 1009  ? HCT 44.9 03/24/2021 1119  ? PLT 184.0 12/26/2021 1009  ? PLT 229 03/24/2021 1119  ? MCV 92.6 12/26/2021 1009  ? MCV 90 03/24/2021 1119  ? MCH 31.2 12/19/2021 1013  ? MCHC 32.8 12/26/2021 1009  ? RDW 13.5 12/26/2021 1009  ? RDW 12.3 03/24/2021 1119  ? LYMPHSABS 1.3 12/19/2021 1013  ? MONOABS 0.9 12/19/2021 1013  ? EOSABS 0.1 12/19/2021  1013  ? BASOSABS 0.0 12/19/2021 1013  ? ?Absolute eos 12/19/21 - 100 ? ?   ?Assessment & Plan:  ? ?Discussion: ?88 year old [name] with chronic hypoxemic respiratory failure, chronic diastolic heart failure, atrial fibrillation, chronic pulmonary embolism dx 11/12/19, hypertension, GERD, history of pulmonary nodules and primary hyperparathyroidism who presents as a new consult for emphysema. Unclear what her tobacco history is but she does have mild emphysema on CT. Her recent dyspnea is likely related to her heart failure and deconditioning which is better after treatment and returning to regular activity. Would be reasonable to treat her baseline symptoms with maintenance inhaler. Ambulatory O2 in-office demonstrates no O2 needs at rest but does require 2L with exertion ? ?Emphysema ?--START Spiriva 2.5 mcg TWO puffs ONCE a day ? ?Chronic hypoxemic respiratory failure ?--CONTINUE supplemental oxygen for goal SpO2 >88% ?--CONTINUE 2L with activity and sleep ? ?Health Maintenance ?Immunization History  ?  Administered Date(s) Administered  ? Fluad Quad(high Dose 65+) 07/15/2019, 08/31/2020  ? Influenza Split 08/22/2011  ? Influenza Whole 08/10/2010  ? Influenza, High Dose Seasonal PF 08/04/2014, 08/05/2016, 08/04/2017, 07/24/2018  ? Influenza,inj,Quad PF,6+ Mos 08/10/2013  ? Influenza-Unspecified 07/27/2017, 09/21/2021  ? PFIZER(Purple Top)SARS-COV-2 Vaccination 01/10/2020, 02/03/2020, 02/22/2021  ? Pneumococcal Conjugate-13 08/10/2014  ? Pneumococcal Polysaccharide-23 08/10/2013  ? Tdap 08/31/2020  ? ?CT Lung Screen- not qualified due to age ? ?No orders of the defined types were placed in this encounter. ? ?Meds ordered this encounter  ?Medications  ? Tiotropium Bromide Monohydrate (SPIRIVA RESPIMAT) 2.5 MCG/ACT AERS  ?  Sig: Inhale 2 puffs into the lungs daily.  ?  Dispense:  4 g  ?  Refill:  5  ? Tiotropium Bromide Monohydrate (SPIRIVA RESPIMAT) 2.5 MCG/ACT AERS  ?  Sig: Inhale 2 puffs into the lungs daily.  ?   Dispense:  4 g  ?  Refill:  0  ? ? ?Return in about 3 months (around 05/11/2022). ? ?I have spent a total time of 45-minutes on the day of the appointment reviewing prior documentation, coordinating care and discussi

## 2022-02-08 NOTE — Patient Instructions (Addendum)
Emphysema ?--START Spiriva 2.5 mcg TWO puffs ONCE a day. Provide sample and Rx ? ?Chronic hypoxemic respiratory failure ?--CONTINUE supplemental oxygen for goal SpO2 >88% ? ?Follow-up with me in June ?

## 2022-03-04 ENCOUNTER — Emergency Department (HOSPITAL_COMMUNITY)
Admission: EM | Admit: 2022-03-04 | Discharge: 2022-03-04 | Disposition: A | Discharge status: Home / Self Care | Payer: Medicare HMO | Attending: Emergency Medicine | Admitting: Emergency Medicine

## 2022-03-04 ENCOUNTER — Other Ambulatory Visit: Payer: Self-pay

## 2022-03-04 ENCOUNTER — Encounter (HOSPITAL_COMMUNITY): Payer: Self-pay | Admitting: Emergency Medicine

## 2022-03-04 DIAGNOSIS — Z20822 Contact with and (suspected) exposure to covid-19: Secondary | ICD-10-CM | POA: Diagnosis not present

## 2022-03-04 DIAGNOSIS — R531 Weakness: Secondary | ICD-10-CM | POA: Diagnosis not present

## 2022-03-04 DIAGNOSIS — I509 Heart failure, unspecified: Secondary | ICD-10-CM | POA: Diagnosis not present

## 2022-03-04 DIAGNOSIS — I110 Hypertensive heart disease with heart failure: Secondary | ICD-10-CM | POA: Insufficient documentation

## 2022-03-04 DIAGNOSIS — R110 Nausea: Secondary | ICD-10-CM | POA: Diagnosis not present

## 2022-03-04 DIAGNOSIS — R42 Dizziness and giddiness: Secondary | ICD-10-CM | POA: Insufficient documentation

## 2022-03-04 DIAGNOSIS — Z79899 Other long term (current) drug therapy: Secondary | ICD-10-CM | POA: Insufficient documentation

## 2022-03-04 DIAGNOSIS — E860 Dehydration: Secondary | ICD-10-CM | POA: Insufficient documentation

## 2022-03-04 DIAGNOSIS — R55 Syncope and collapse: Secondary | ICD-10-CM | POA: Diagnosis not present

## 2022-03-04 DIAGNOSIS — I4891 Unspecified atrial fibrillation: Secondary | ICD-10-CM | POA: Insufficient documentation

## 2022-03-04 DIAGNOSIS — R1084 Generalized abdominal pain: Secondary | ICD-10-CM | POA: Diagnosis not present

## 2022-03-04 DIAGNOSIS — R0902 Hypoxemia: Secondary | ICD-10-CM | POA: Diagnosis not present

## 2022-03-04 DIAGNOSIS — Z7901 Long term (current) use of anticoagulants: Secondary | ICD-10-CM | POA: Insufficient documentation

## 2022-03-04 DIAGNOSIS — I959 Hypotension, unspecified: Secondary | ICD-10-CM | POA: Diagnosis not present

## 2022-03-04 LAB — BASIC METABOLIC PANEL
Anion gap: 12 (ref 5–15)
BUN: 20 mg/dL (ref 8–23)
CO2: 26 mmol/L (ref 22–32)
Calcium: 9.9 mg/dL (ref 8.9–10.3)
Chloride: 100 mmol/L (ref 98–111)
Creatinine, Ser: 1.06 mg/dL — ABNORMAL HIGH (ref 0.44–1.00)
GFR, Estimated: 50 mL/min — ABNORMAL LOW (ref >60)
Glucose, Bld: 121 mg/dL — ABNORMAL HIGH (ref 70–99)
Potassium: 3.7 mmol/L (ref 3.5–5.1)
Sodium: 138 mmol/L (ref 135–145)

## 2022-03-04 LAB — CBC
HCT: 44.9 % (ref 36.0–46.0)
Hemoglobin: 14.9 g/dL (ref 12.0–15.0)
MCH: 30.5 pg (ref 26.0–34.0)
MCHC: 33.2 g/dL (ref 30.0–36.0)
MCV: 91.8 fL (ref 80.0–100.0)
Platelets: 216 10*3/uL (ref 150–400)
RBC: 4.89 MIL/uL (ref 3.87–5.11)
RDW: 13.3 % (ref 11.5–15.5)
WBC: 5.8 10*3/uL (ref 4.0–10.5)
nRBC: 0 % (ref 0.0–0.2)

## 2022-03-04 LAB — HEPATIC FUNCTION PANEL
ALT: 13 U/L (ref 0–44)
AST: 20 U/L (ref 15–41)
Albumin: 3.7 g/dL (ref 3.5–5.0)
Alkaline Phosphatase: 76 U/L (ref 38–126)
Bilirubin, Direct: 0.3 mg/dL — ABNORMAL HIGH (ref 0.0–0.2)
Indirect Bilirubin: 0.9 mg/dL (ref 0.3–0.9)
Total Bilirubin: 1.2 mg/dL (ref 0.3–1.2)
Total Protein: 6.1 g/dL — ABNORMAL LOW (ref 6.5–8.1)

## 2022-03-04 LAB — URINALYSIS, ROUTINE W REFLEX MICROSCOPIC
Bilirubin Urine: NEGATIVE
Glucose, UA: NEGATIVE mg/dL
Ketones, ur: NEGATIVE mg/dL
Leukocytes,Ua: NEGATIVE
Nitrite: NEGATIVE
Protein, ur: NEGATIVE mg/dL
Specific Gravity, Urine: 1.005 (ref 1.005–1.030)
pH: 7 (ref 5.0–8.0)

## 2022-03-04 LAB — LIPASE, BLOOD: Lipase: 25 U/L (ref 11–51)

## 2022-03-04 LAB — RESP PANEL BY RT-PCR (FLU A&B, COVID) ARPGX2
Influenza A by PCR: NEGATIVE
Influenza B by PCR: NEGATIVE
SARS Coronavirus 2 by RT PCR: NEGATIVE

## 2022-03-04 LAB — TROPONIN I (HIGH SENSITIVITY): Troponin I (High Sensitivity): 6 ng/L (ref <18)

## 2022-03-04 LAB — MAGNESIUM: Magnesium: 1.8 mg/dL (ref 1.7–2.4)

## 2022-03-04 MED ORDER — SODIUM CHLORIDE 0.9 % IV BOLUS
500.0000 mL | Freq: Once | INTRAVENOUS | Status: AC
  Administered 2022-03-04: 500 mL via INTRAVENOUS

## 2022-03-04 MED ORDER — ONDANSETRON HCL 4 MG/2ML IJ SOLN
4.0000 mg | Freq: Once | INTRAMUSCULAR | Status: AC
  Administered 2022-03-04: 4 mg via INTRAVENOUS
  Filled 2022-03-04: qty 2

## 2022-03-04 NOTE — ED Provider Notes (Signed)
?[facility] ?Provider Note ? ? ?CSN: [id number] ?Arrival date & time: 03/04/22  1351 ? ?  ? ?History ?PMH: Hypertension, HLD, hyperparathyroidism, CHF, emphysema, on chronic home O2 (2L), atrial fibrillation, hx of chronic PE on Eliquis ?Chief Complaint  ?Patient presents with  ? Near Syncope  ? ? ?Annette [name] is a 88 y.o. female. ?Presents the ED with a chief complaint of generalized weakness, nausea, and feeling lightheaded.  She states that throughout this week she is felt a little bit more weaker than normal.  She says that today she was standing making her oatmeal, when she suddenly felt very lightheaded like she was going to pass out.  The symptoms only lasted for couple of seconds and they improved.  She has since then felt nauseous and continued to feel weak.  She states that she feels mostly dehydrated and has had dry mouth.  She has been taking her Lasix 40 mg daily and her blood pressure medications regularly.  She expresses concern that she does not need to be on blood pressure medication and that she is dehydrated and that is why she is feeling this way. ? ?She denies any chest pain, shortness of breath, visual changes, numbness, focal weakness, vomiting, abdominal pain, diarrhea, constipation, fevers, chills, dysuria, hematuria. ? ? ?Near Syncope ? ? ?  ? ?Home Medications ?Prior to Admission medications   ?Medication Sig Start Date End Date Taking? Authorizing Provider  ?acetaminophen (TYLENOL) 500 MG tablet Take 500 mg by mouth 3 (three) times daily.    [provider]  ?amLODipine (NORVASC) 5 MG tablet TAKE 1 TABLET EVERY DAY ?Patient taking differently: Take 5 mg by mouth at bedtime. 12/15/21   [place], [name], MD  ?apixaban (ELIQUIS) 5 MG TABS tablet Take 1 tablet (5 mg total) by mouth 2 (two) times daily. 03/24/21   [name], MD  ?cinacalcet (SENSIPAR) 30 MG tablet TAKE 1 TABLET EVERY DAY ?Patient taking differently: Take 30 mg by  mouth. 11/20/21   Renato Shin, MD  ?furosemide (LASIX) 40 MG tablet Take 1 tablet (40 mg total) by mouth daily. 01/08/22   [name], MD  ?latanoprost (XALATAN) 0.005 % ophthalmic solution INSTILL 1 DROP INTO BOTH EYES AT BEDTIME ?Patient taking differently: Place 1 drop into both eyes at bedtime. 10/29/17   Marletta Lor, MD  ?Magnesium Oxide 400 MG CAPS Take 1 capsule (400 mg total) by mouth daily. 01/08/22   [name], MD  ?omeprazole (PRILOSEC) 40 MG capsule Take 1 capsule (40 mg total) by mouth daily. 12/29/21   [place], [name], MD  ?OXYGEN Inhale into the lungs.    [provider]  ?pravastatin (PRAVACHOL) 20 MG tablet TAKE 1 TABLET EVERY DAY ?Patient taking differently: Take 20 mg by mouth at bedtime. 09/06/21   [place], [name], MD  ?Tiotropium Bromide Monohydrate (SPIRIVA RESPIMAT) 2.5 MCG/ACT AERS Inhale 2 puffs into the lungs daily. 02/08/22   Margaretha Seeds, MD  ?Tiotropium Bromide Monohydrate (SPIRIVA RESPIMAT) 2.5 MCG/ACT AERS Inhale 2 puffs into the lungs daily. 02/08/22   Margaretha Seeds, MD  ?vitamin B-12 (CYANOCOBALAMIN) 1000 MCG tablet Take 1,000 mcg by mouth 2 (two) times a week. Tuesday and thursday    [provider]  ?VITAMIN D PO Take 1,000 Units by mouth at bedtime.    [provider]  ?   ? ?Allergies    ?Codeine   ? ?Review of Systems   ?Review of Systems  ?  Cardiovascular:  Positive for near-syncope.  ?Gastrointestinal:  Positive for nausea.  ?Neurological:  Positive for weakness and light-headedness.  ?All other systems reviewed and are negative. ? ?Physical Exam ?Updated Vital Signs ?BP 108/65   Pulse 62   Temp 98.2 ?F (36.8 ?C) (Oral)   Resp (!) 22   SpO2 98%  ?Physical Exam ?Vitals and nursing note reviewed.  ?Constitutional:   ?   General: She is not in acute distress. ?   Appearance: Normal appearance. She is not ill-appearing, toxic-appearing or diaphoretic.  ?HENT:  ?   Head: Normocephalic and atraumatic.  ?   Nose:  No nasal deformity.  ?   Mouth/Throat:  ?   Lips: Pink. No lesions.  ?   Mouth: Mucous membranes are dry. No injury, lacerations, oral lesions or angioedema.  ?   Pharynx: Oropharynx is clear. Uvula midline. No pharyngeal swelling, oropharyngeal exudate, posterior oropharyngeal erythema or uvula swelling.  ?Eyes:  ?   General: Gaze aligned appropriately. No scleral icterus.    ?   Right eye: No discharge.     ?   Left eye: No discharge.  ?   Extraocular Movements: Extraocular movements intact.  ?   Conjunctiva/sclera: Conjunctivae normal.  ?   Right eye: Right conjunctiva is not injected. No exudate or hemorrhage. ?   Left eye: Left conjunctiva is not injected. No exudate or hemorrhage. ?   Pupils: Pupils are equal, round, and reactive to light.  ?Cardiovascular:  ?   Rate and Rhythm: Normal rate and regular rhythm.  ?   Pulses: Normal pulses.     ?     Radial pulses are 2+ on the right side and 2+ on the left side.  ?     Dorsalis pedis pulses are 2+ on the right side and 2+ on the left side.  ?   Heart sounds: Normal heart sounds, S1 normal and S2 normal. Heart sounds not distant. No murmur heard. ?  No friction rub. No gallop. No S3 or S4 sounds.  ?Pulmonary:  ?   Effort: Pulmonary effort is normal. No accessory muscle usage or respiratory distress.  ?   Breath sounds: Normal breath sounds. No stridor. No wheezing, rhonchi or rales.  ?Chest:  ?   Chest wall: No tenderness.  ?Abdominal:  ?   General: Abdomen is flat. Bowel sounds are normal. There is no distension.  ?   Palpations: Abdomen is soft. There is no mass or pulsatile mass.  ?   Tenderness: There is no abdominal tenderness. There is no right CVA tenderness, left CVA tenderness, guarding or rebound.  ?Musculoskeletal:  ?   Right lower leg: No edema.  ?   Left lower leg: No edema.  ?Skin: ?   General: Skin is warm and dry.  ?   Coloration: Skin is not jaundiced or pale.  ?   Findings: No bruising, erythema, lesion or rash.  ?Neurological:  ?   General: No  focal deficit present.  ?   Mental Status: She is alert and oriented to person, place, and time. Mental status is at baseline.  ?   GCS: GCS eye subscore is 4. GCS verbal subscore is 5. GCS motor subscore is 6.  ?   Comments: Alert and Oriented x 3 ?Speech clear with no aphasia ?Cranial Nerve testing ?- Visual Fields grossly intact ?- PERRLA. EOM intact. No Nystagmus ?- Facial Sensation grossly intact ?- No facial asymmetry ?Motor: ?- 5/5 motor strength in all four  extremities.  ?Sensation: ?- Grossly intact in all four extremities.  ?Coordination:  ?- Finger to nose and heel to shin intact bilaterally ?  ?Psychiatric:     ?   Mood and Affect: Mood normal.     ?   Behavior: Behavior normal. Behavior is cooperative.  ? ? ?ED Results / Procedures / Treatments   ?Labs ?(all labs ordered are listed, but only abnormal results are displayed) ?Labs Reviewed  ?BASIC METABOLIC PANEL - Abnormal; Notable for the following components:  ?    Result Value  ? Glucose, Bld 121 (*)   ? Creatinine, Ser 1.06 (*)   ? GFR, Estimated 50 (*)   ? All other components within normal limits  ?HEPATIC FUNCTION PANEL - Abnormal; Notable for the following components:  ? Total Protein 6.1 (*)   ? Bilirubin, Direct 0.3 (*)   ? All other components within normal limits  ?RESP PANEL BY RT-PCR (FLU A&B, COVID) ARPGX2  ?CBC  ?URINALYSIS, ROUTINE W REFLEX MICROSCOPIC  ?LIPASE, BLOOD  ?MAGNESIUM  ?TROPONIN I (HIGH SENSITIVITY)  ? ? ?EKG ?EKG Interpretation ? ?Date/Time:  Sunday March 04 2022 13:58:04 EDT ?Ventricular Rate:  65 ?PR Interval:    ?QRS Duration: 83 ?QT Interval:  409 ?QTC Calculation: 426 ?R Axis:   22 ?Text Interpretation: Atrial fibrillation Probable anteroseptal infarct, old Baseline wander in lead(s) V1 No significant change since last tracing Confirmed by Dorie Rank [phone]) on 03/04/2022 2:39:08 PM ? ?Radiology ?No results found. ? ?Procedures ?Procedures  ?This patient was on telemetry or cardiac monitoring during their time in the ED.   ? ? ?Medications Ordered in ED ?Medications  ?sodium chloride 0.9 % bolus 500 mL (500 mLs Intravenous New Bag/Given 03/04/22 1445)  ?ondansetron (ZOFRAN) injection 4 mg (4 mg Intravenous Given 03/04/22 1445)

## 2022-03-04 NOTE — ED Notes (Signed)
Pt wheeled to waiting room. Pt verbalized understanding of discharge instructions.   

## 2022-03-04 NOTE — Discharge Instructions (Addendum)
You were seen today for a near syncopal episode.  You also had some nausea upon arrival.  Your work-up shows no acute process at this time.  After giving you a bag of fluid for hydration and an antinausea medication you seem to feel much better.  Recommend follow-up with your primary care provider. I have provided a prescription for antinausea medication which can be taken as needed.  Return to the emergency department if you develop life-threatening condition such as shortness of breath, chest pain, or altered level of consciousness. ?

## 2022-03-04 NOTE — ED Triage Notes (Signed)
Pt from home via [facility] with reports of near syncope while cooking. Pt denies pain at this time. Pt does report nausea. ?

## 2022-03-04 NOTE — ED Provider Notes (Signed)
Patient care taken over at shift handoff from Paulita Cradle, PA-C. ? ?Annette [name] is a 88 y.o. female who presents the ED with a chief complaint of generalized weakness, nausea, and feeling lightheaded.  She states that throughout this week she is felt a little bit more weaker than normal.  She says that today she was standing making her oatmeal, when she suddenly felt very lightheaded like she was going to pass out.  The symptoms only lasted for couple of seconds and they improved.  She has since then felt nauseous and continued to feel weak.  She states that she feels mostly dehydrated and has had dry mouth.  She has been taking her Lasix 40 mg daily and her blood pressure medications regularly.  She expresses concern that she does not need to be on blood pressure medication and that she is dehydrated and that is why she is feeling this way. ?  ?She denies any chest pain, shortness of breath, visual changes, numbness, focal weakness, vomiting, abdominal pain, diarrhea, constipation, fevers, chills, dysuria, hematuria. ?  ?  ?Physical Exam  ?BP 108/65   Pulse 62   Temp 98.2 ?F (36.8 ?C) (Oral)   Resp (!) 22   SpO2 98%  ? ?Physical Exam ? ?Procedures  ?Procedures ? ?ED Course / MDM  ?  ?Medical Decision Making ?Amount and/or Complexity of Data Reviewed ?Labs: ordered. ? ?Risk ?Prescription drug management. ? ?Urinalysis was unremarkable.  The patient is negative for COVID, influenza.  EKG shows controlled atrial fibrillation which is baseline for the patient. ? ?Patient states she is feeling much better after fluid administration and Zofran.  Patient was able to tolerate p.o. water and then requested a sandwich which she was able to eat.  I see no reason for further work-up at this time.  It appears the patient was dehydrated possibly due to her Lasix dosages.  Recommend following up with her primary care team to discuss her Lasix dosages.  Discharge home at this time.  I will provide a short course of  antiemetic medication for the patient to use as needed. ? ? ? ? ? ?  ?Dorothyann Peng, PA-C ?03/04/22 1641 ? ?  ?Carmin Muskrat, MD ?03/04/22 2327 ? ?

## 2022-03-05 NOTE — Progress Notes (Signed)
? ?HPI: ?[name] is a 88 y.o. female, who is here today with her son to follow on recent ED visit. ?She was last seen here in the office on 12/26/21. ?Evaluated in the ED on 03/04/22 for episode of near syncope: Nausea,weakness,and feeling lightheaded. ?BP in the ED was 108/65. ?Her BP's at home have been 80-90's/60's since early 01/2022. ? ?Intermittent episodes of lightheadedness when standing up for a few minutes. ?She denies headache,CP,palpitations,worsening SOB,or focal deficit. ? ?HTN on Amlodipine 5 mg daily. ?Atrial fib on Eliquis 5 mg bid. ?HFpEF on Furosemide 40 mg daily, which is causing urinary frequency and sometimes incontinence. She is drinking 36 Oz of water and would like to increase intake. ?She is not longer on Metoprolol. ?Negative for LE edema, orthopnea,,or PND. ? ?She is following low salt diet and eats fruits/vegetables.She is very concerned about elevated glucose in the ED, afraid of developing diabetes. She wonders if she needs to decrease fruit intake, which she really enjoys. ?Negative for polydipsia,polyuria, or polyphagia. ? ?Lab Results  ?Component Value Date  ? CREATININE 1.06 (H) 03/04/2022  ? BUN 20 03/04/2022  ? NA 138 03/04/2022  ? K 3.7 03/04/2022  ? CL 100 03/04/2022  ? CO2 26 03/04/2022  ? ?Lab Results  ?Component Value Date  ? WBC 5.8 03/04/2022  ? HGB 14.9 03/04/2022  ? HCT 44.9 03/04/2022  ? MCV 91.8 03/04/2022  ? PLT 216 03/04/2022  ? ?COPD on supplemental O2 2 LPM, she is frustrated about cost of inhalers and has does not know how much the O2 will be. Pulse O2 at home in the 90's.  ?She follows with pulmonologist. ? ?Urinalysis negative. ?Negative for COVID, influenza.   ?EKG shows controlled atrial fibrillation which is her baseline. ?She was treated with IVF and zofran. ? ?Review of Systems  ?Constitutional:  Positive for activity change and fatigue. Negative for appetite change and fever.  ?HENT:  Negative for nosebleeds and sore throat.   ?Eyes:  Negative for  redness and visual disturbance.  ?Respiratory:  Negative for cough and wheezing.   ?Gastrointestinal:  Negative for abdominal pain and vomiting.  ?     Negative for changes in bowel habits.  ?Genitourinary:  Negative for decreased urine volume, dysuria and hematuria.  ?Musculoskeletal:  Positive for gait problem.  ?Neurological:  Negative for syncope, facial asymmetry and weakness.  ?Psychiatric/Behavioral:  Negative for confusion. The patient is nervous/anxious.   ?Rest see pertinent positives and negatives per HPI. ? ?Current Outpatient Medications on File Prior to Visit  ?Medication Sig Dispense Refill  ? acetaminophen (TYLENOL) 500 MG tablet Take 500 mg by mouth 3 (three) times daily.    ? apixaban (ELIQUIS) 5 MG TABS tablet Take 1 tablet (5 mg total) by mouth 2 (two) times daily. 60 tablet 0  ? cinacalcet (SENSIPAR) 30 MG tablet TAKE 1 TABLET EVERY DAY (Patient taking differently: Take 30 mg by mouth.) 90 tablet 3  ? latanoprost (XALATAN) 0.005 % ophthalmic solution INSTILL 1 DROP INTO BOTH EYES AT BEDTIME (Patient taking differently: Place 1 drop into both eyes at bedtime.) 7.5 mL 3  ? Magnesium Oxide 400 MG CAPS Take 1 capsule (400 mg total) by mouth daily. 90 capsule 3  ? omeprazole (PRILOSEC) 40 MG capsule Take 1 capsule (40 mg total) by mouth daily. 90 capsule 3  ? OXYGEN Inhale into the lungs.    ? pravastatin (PRAVACHOL) 20 MG tablet TAKE 1 TABLET EVERY DAY (Patient taking differently: Take 20 mg by  mouth at bedtime.) 90 tablet 2  ? Tiotropium Bromide Monohydrate (SPIRIVA RESPIMAT) 2.5 MCG/ACT AERS Inhale 2 puffs into the lungs daily. 4 g 5  ? Tiotropium Bromide Monohydrate (SPIRIVA RESPIMAT) 2.5 MCG/ACT AERS Inhale 2 puffs into the lungs daily. 4 g 0  ? vitamin B-12 (CYANOCOBALAMIN) 1000 MCG tablet Take 1,000 mcg by mouth 2 (two) times a week. Tuesday and thursday    ? VITAMIN D PO Take 1,000 Units by mouth at bedtime.    ? ?No current facility-administered medications on file prior to visit.  ? ?Past  Medical History:  ?Diagnosis Date  ? GERD 10/03/2007  ? HYPERTENSION 10/03/2007  ? OSTEOPENIA 02/03/2008  ? Primary hyperparathyroidism ([place]) 10/03/2007  ? ?Allergies  ?Allergen Reactions  ? Codeine Nausea And Vomiting  ? ?Social History  ? ?Socioeconomic History  ? Marital status: Widowed  ?  Spouse name: Not on file  ? Number of children: Not on file  ? Years of education: Not on file  ? Highest education level: Not on file  ?Occupational History  ? Occupation: retired  ?Tobacco Use  ? Smoking status: Former  ?  Types: Cigarettes  ?  Quit date: 11/12/1978  ?  Years since quitting: 43.3  ? Smokeless tobacco: Never  ?Substance and Sexual Activity  ? Alcohol use: No  ? Drug use: No  ? Sexual activity: Not on file  ?Other Topics Concern  ? Not on file  ?Social History Narrative  ? Pt states her son and daughter in law live with her  ? ?Social Determinants of Health  ? ?Financial Resource Strain: Low Risk   ? Difficulty of Paying Living Expenses: Not hard at all  ?Food Insecurity: No Food Insecurity  ? Worried About Charity fundraiser in the Last Year: Never true  ? Ran Out of Food in the Last Year: Never true  ?Transportation Needs: No Transportation Needs  ? Lack of Transportation (Medical): No  ? Lack of Transportation (Non-Medical): No  ?Physical Activity: Inactive  ? Days of Exercise per Week: 0 days  ? Minutes of Exercise per Session: 0 min  ?Stress: No Stress Concern Present  ? Feeling of Stress : Not at all  ?Social Connections: Moderately Isolated  ? Frequency of Communication with Friends and Family: More than three times a week  ? Frequency of Social Gatherings with Friends and Family: Never  ? Attends Religious Services: More than 4 times per year  ? Active Member of Clubs or Organizations: No  ? Attends Archivist Meetings: Never  ? Marital Status: Widowed  ? ?Vitals:  ? 03/06/22 0851  ?BP: 110/70  ?Pulse: 98  ?Resp: 16  ?SpO2: 98%  ? ?Body mass index is 25.04 kg/m?. ? ?Physical Exam ?Vitals and  nursing note reviewed.  ?Constitutional:   ?   General: She is not in acute distress. ?   Appearance: She is well-developed.  ?HENT:  ?   Head: Normocephalic and atraumatic.  ?   Mouth/Throat:  ?   Mouth: Mucous membranes are moist.  ?   Pharynx: Oropharynx is clear.  ?Eyes:  ?   Conjunctiva/sclera: Conjunctivae normal.  ?Cardiovascular:  ?   Rate and Rhythm: Normal rate. Rhythm irregular.  ?   Pulses:     ?     Dorsalis pedis pulses are 2+ on the right side and 2+ on the left side.  ?   Heart sounds: No murmur heard. ?Pulmonary:  ?   Effort: Pulmonary effort is  normal. No respiratory distress.  ?   Breath sounds: Normal breath sounds.  ?Abdominal:  ?   Palpations: Abdomen is soft.  ?   Tenderness: There is no abdominal tenderness.  ?Musculoskeletal:  ?   Right lower leg: No edema.  ?   Left lower leg: No edema.  ?Lymphadenopathy:  ?   Cervical: No cervical adenopathy.  ?Skin: ?   General: Skin is warm.  ?   Findings: No erythema or rash.  ?Neurological:  ?   General: No focal deficit present.  ?   Mental Status: She is alert and oriented to person, place, and time.  ?   Cranial Nerves: No cranial nerve deficit.  ?   Comments: Unstable gait, assisted by a cane.  ?Psychiatric:     ?   Mood and Affect: Mood is anxious.  ?   Comments: Well groomed, good eye contact.  ? ?ASSESSMENT AND PLAN: ? ?[name] was seen today for follow-up. ? ?Diagnoses and all orders for this visit: ?Orders Placed This Encounter  ?Procedures  ? POC HgB A1c  ? ?Lab Results  ?Component Value Date  ? HGBA1C 5.2 03/06/2022  ? ?Hyperglycemia ?A1C in normal range. ?In general she follows a healthful diet, no further dietary restrictions are necessary. ? ?Intermittent lightheadedness ?We discussed possible etiologies. ?Some of her chronic medical conditions and medication could be contributing factors but hypotension is mot likely the cause. ?Fall precautions dicussed. ? ?Essential hypertension ?Home BP's 80-90's/60's, so recommend stopping  Amlodipine 5 mg. ?Continue monitoring BP at home. ?Low salt diet to continue. ?Continue monitoring BP at home. ?She would would like to move next month f/u appt for 3 months instead. ?Instructed about warning

## 2022-03-06 ENCOUNTER — Encounter: Payer: Self-pay | Admitting: Family Medicine

## 2022-03-06 ENCOUNTER — Ambulatory Visit (INDEPENDENT_AMBULATORY_CARE_PROVIDER_SITE_OTHER): Payer: Medicare HMO | Admitting: Family Medicine

## 2022-03-06 VITALS — BP 110/70 | HR 98 | Resp 16 | Ht 63.0 in | Wt 141.4 lb

## 2022-03-06 DIAGNOSIS — J432 Centrilobular emphysema: Secondary | ICD-10-CM

## 2022-03-06 DIAGNOSIS — R739 Hyperglycemia, unspecified: Secondary | ICD-10-CM

## 2022-03-06 DIAGNOSIS — I10 Essential (primary) hypertension: Secondary | ICD-10-CM

## 2022-03-06 DIAGNOSIS — I5032 Chronic diastolic (congestive) heart failure: Secondary | ICD-10-CM | POA: Diagnosis not present

## 2022-03-06 DIAGNOSIS — R42 Dizziness and giddiness: Secondary | ICD-10-CM

## 2022-03-06 LAB — POCT GLYCOSYLATED HEMOGLOBIN (HGB A1C): Hemoglobin A1C: 5.2 % (ref 4.0–5.6)

## 2022-03-06 MED ORDER — FUROSEMIDE 40 MG PO TABS: 20.0000 mg | ORAL_TABLET | Freq: Every day | ORAL | 3 refills | Status: DC

## 2022-03-06 NOTE — Patient Instructions (Addendum)
A few things to remember from today's visit: ? ?Essential hypertension ? ?Hyperglycemia - Plan: POC HgB A1c ? ?If you need refills please call your pharmacy. ?Do not use My Chart to request refills or for acute issues that need immediate attention. ?  ?Stop Amlodipine. ?Decrease Furosemide dose from 40 mg to 20 mg, you can take an extra 20 if shortness or lower extremity swelling. ? ?Please be sure medication list is accurate. ?If a new problem present, please set up appointment sooner than planned today. ? ?If blood pressure starts going over 140/90 please let me know. ?Continue low salt diet. ?We can check kidney function next visit. ? ? ? ? ? ? ?

## 2022-03-09 ENCOUNTER — Telehealth: Payer: Self-pay | Admitting: Cardiology

## 2022-03-09 NOTE — Telephone Encounter (Signed)
Spoke with pt, she wanted to let dr Gardiner Rhyme know that after she saw him she was told to take 40 mg of furosemide daily and to limit water to 3 glasses a day. She reports she passed out and went to the ER and was dehydrated. She went to see her medical doctor and she decreased her furosemide to 20 mg once daily and increased to 4 glasses of water daily. She continues to have lightheadedness and her blood pressure is running 109/63/69 and yesterday it was 90/64/84. She has a follow up appointment 04/04/22 with meng but wanted dr Gardiner Rhyme to know how she is feeling right now. She would like to decrease or stop the furosemide. The swelling she had is gone and she does not complain of SOB but she has to take extra deep breaths, aware will forward to dr Gardiner Rhyme ?

## 2022-03-09 NOTE — Telephone Encounter (Signed)
Pt wanted make Dr. Gardiner Rhyme aware of the dosage change to her Lasix that her PCP has made. Pt states she now takes '20mg'$  daily and has 4 glasses of water. Please advise ?

## 2022-03-11 NOTE — Telephone Encounter (Signed)
Would hold Lasix for 3 days then resume 20 mg daily.  Monitor daily weights and can take extra dose if gains more than 3 pounds in 1 day or 5 pounds in 1 week ?

## 2022-03-12 NOTE — Telephone Encounter (Signed)
Patient aware of recommendations and verbalized understanding

## 2022-03-26 ENCOUNTER — Ambulatory Visit: Payer: Medicare HMO | Admitting: Family Medicine

## 2022-04-04 ENCOUNTER — Ambulatory Visit: Payer: Medicare HMO | Admitting: Physician Assistant

## 2022-04-10 ENCOUNTER — Ambulatory Visit: Payer: Medicare HMO | Admitting: General Practice

## 2022-04-11 DIAGNOSIS — H353131 Nonexudative age-related macular degeneration, bilateral, early dry stage: Secondary | ICD-10-CM | POA: Diagnosis not present

## 2022-04-11 DIAGNOSIS — H40013 Open angle with borderline findings, low risk, bilateral: Secondary | ICD-10-CM | POA: Diagnosis not present

## 2022-04-21 NOTE — Progress Notes (Unsigned)
Cardiology Clinic Note   Patient Name: Annette [name] Date of Encounter: 04/23/2022  Primary Care Provider:  Martinique, [name], MD Primary Cardiologist:  [name], MD  Patient Profile    Annette [name] 88 year old female presents the clinic today for follow-up evaluation of her CHF and near syncope.  Past Medical History    Past Medical History:  Diagnosis Date   GERD 10/03/2007   HYPERTENSION 10/03/2007   OSTEOPENIA 02/03/2008   Primary hyperparathyroidism ([place]) 10/03/2007   Past Surgical History:  Procedure Laterality Date   ABDOMINAL HYSTERECTOMY      Allergies  Allergies  Allergen Reactions   Codeine Nausea And Vomiting    History of Present Illness    Annette [name] is a PMH of HTN, PE, primary hyperparathyroidism, chronic diastolic CHF, permanent atrial fibrillation, and COPD.  She was initially referred by her PCP for evaluation of her atrial fibrillation.  She was admitted with PE 11/12/2019 until 11/16/2019.  She was started on heparin GTT and transition to apixaban.  Her echocardiogram 1/21 showed normal biventricular function no significant valvular disease.  She took apixaban for 6 months then stopped.  She wore a cardiac event monitor for 3 days 5/22.  She was noted to have worn the monitor for 9 hours.  It showed 100% atrial fibrillation burden with an average rate of 71 bpm.  Her echocardiogram 6/22 showed normal biventricular function mild-moderate TR, mild MR, PFO.  Her cardiac event monitor 8/22 showed 100% A-fib burden with an average heart rate of 75 bpm.  She was admitted to the hospital 12/19/2021 until 12/21/2021 with acute hypoxic respiratory failure.  Her oxygen saturation was 84%.  On presentation she was noted to be in atrial fibrillation with RVR with rates in the 110 range.  Her CT chest showed small chronic PE and right lower lobe, new right pleural effusion, emphysema type changes throughout lung fields.  She received IV diuresis for  suspected acute on chronic diastolic CHF.  She was restarted on her home furosemide 40 mg 3 times per week at discharge.  She was seen in follow-up by Dr. Gardiner Rhyme on 01/05/2022.  She reported that she was doing okay.  Her weight had been stable.  She had 1 episode of chest discomfort that lasted a few seconds and resolved.  She reported occasional episodes of lightheadedness but denied syncope.  She continued to have lower extremity swelling.  She denied shortness of breath.  She reported compliance with her apixaban.  She did note bruising but denied any bleeding issues.  She presents to the clinic today for follow-up evaluation and states she feels fairly well.  She is now using 2 L of oxygen via nasal cannula.  Her oxygen saturation is 95.  She continues to do all of her activities of daily living and chores at home.  She is  using a rollator walker.  She is very conscious of her foods and does not eat out.  She uses herbs and seasoning and cooks her for fresh.  She brings in a weight log which shows very stable weight around 142 pounds.  She denies bleeding issues.  She reports compliance with her apixaban.  I will give salty-6 diet sheet, have her continue to maintain her physical activity, and plan follow-up for 6 months.   Today she denies chest pain, shortness of breath, lower extremity edema, fatigue, palpitations, melena, hematuria, hemoptysis, diaphoresis, weakness, presyncope, syncope, orthopnea, and PND.    Home Medications  Prior to Admission medications   Medication Sig Start Date End Date Taking? Authorizing Provider  acetaminophen (TYLENOL) 500 MG tablet Take 500 mg by mouth 3 (three) times daily.    [provider]  apixaban (ELIQUIS) 5 MG TABS tablet Take 1 tablet (5 mg total) by mouth 2 (two) times daily. 03/24/21   [name], MD  cinacalcet (SENSIPAR) 30 MG tablet TAKE 1 TABLET EVERY DAY Patient taking differently: Take 30 mg by mouth. 11/20/21   Renato Shin, MD  furosemide (LASIX) 40 MG tablet Take 0.5 tablets (20 mg total) by mouth daily. 03/06/22   Martinique, [name], MD  latanoprost (XALATAN) 0.005 % ophthalmic solution INSTILL 1 DROP INTO BOTH EYES AT BEDTIME Patient taking differently: Place 1 drop into both eyes at bedtime. 10/29/17   Marletta Lor, MD  Magnesium Oxide 400 MG CAPS Take 1 capsule (400 mg total) by mouth daily. 01/08/22   [name], MD  omeprazole (PRILOSEC) 40 MG capsule Take 1 capsule (40 mg total) by mouth daily. 12/29/21   Martinique, [name], MD  OXYGEN Inhale into the lungs.    [provider]  pravastatin (PRAVACHOL) 20 MG tablet TAKE 1 TABLET EVERY DAY Patient taking differently: Take 20 mg by mouth at bedtime. 09/06/21   Martinique, [name], MD  Tiotropium Bromide Monohydrate (SPIRIVA RESPIMAT) 2.5 MCG/ACT AERS Inhale 2 puffs into the lungs daily. 02/08/22   Margaretha Seeds, MD  Tiotropium Bromide Monohydrate (SPIRIVA RESPIMAT) 2.5 MCG/ACT AERS Inhale 2 puffs into the lungs daily. 02/08/22   Margaretha Seeds, MD  vitamin B-12 (CYANOCOBALAMIN) 1000 MCG tablet Take 1,000 mcg by mouth 2 (two) times a week. Tuesday and thursday    [provider]  VITAMIN D PO Take 1,000 Units by mouth at bedtime.    [provider]    Family History    Family History  Problem Relation Age of Onset   Hypercalcemia Neg Hx    She indicated that the status of her neg hx is unknown.  Social History    Social History   Socioeconomic History   Marital status: Widowed    Spouse name: Not on file   Number of children: Not on file   Years of education: Not on file   Highest education level: Not on file  Occupational History   Occupation: retired  Tobacco Use   Smoking status: Former    Types: Cigarettes    Quit date: 11/12/1978    Years since quitting: 43.4   Smokeless tobacco: Never  Substance and Sexual Activity   Alcohol use: No   Drug use: No   Sexual activity: Not on file  Other  Topics Concern   Not on file  Social History Narrative   Pt states her son and daughter in law live with her   Social Determinants of Health   Financial Resource Strain: [place]  (07/12/2021)   Overall Financial Resource Strain (CARDIA)    Difficulty of Paying Living Expenses: Not hard at all  Food Insecurity: No [place] (07/12/2021)   Hunger Vital Sign    Worried About Running Out of Food in the Last Year: Never true    [place] in the Last Year: Never true  Transportation Needs: No Transportation Needs (07/12/2021)   PRAPARE - Hydrologist (Medical): No    Lack of Transportation (Non-Medical): No  Physical Activity: Inactive (07/12/2021)   Exercise Vital Sign  Days of Exercise per Week: 0 days    Minutes of Exercise per Session: 0 min  Stress: No Stress Concern Present (07/12/2021)   [place]    Feeling of Stress : Not at all  Social Connections: Moderately Isolated (07/12/2021)   Social Connection and Isolation Panel [NHANES]    Frequency of Communication with Friends and Family: More than three times a week    Frequency of Social Gatherings with Friends and Family: Never    Attends Religious Services: More than 4 times per year    Active Member of [employer] or Organizations: No    Attends Archivist Meetings: Never    Marital Status: Widowed  Intimate Partner Violence: Not At Risk (07/12/2021)   Humiliation, Afraid, Rape, and Kick questionnaire    Fear of Current or Ex-Partner: No    Emotionally Abused: No    Physically Abused: No    Sexually Abused: No     Review of Systems    General:  No chills, fever, night sweats or weight changes.  Cardiovascular:  No chest pain, dyspnea on exertion, edema, orthopnea, palpitations, paroxysmal nocturnal dyspnea. Dermatological: No rash, lesions/masses Respiratory: No cough, dyspnea Urologic: No hematuria,  dysuria Abdominal:   No nausea, vomiting, diarrhea, bright red blood per rectum, melena, or hematemesis Neurologic:  No visual changes, wkns, changes in mental status. All other systems reviewed and are otherwise negative except as noted above.  Physical Exam    VS:  BP 134/86 (BP Location: Left Arm, Patient Position: Sitting, Cuff Size: Normal)   Pulse 92   Ht '5\' 2"'$  (1.575 m)   Wt 142 lb 6.4 oz (64.6 kg)   SpO2 95% Comment: O2 2L  BMI 26.05 kg/m  , BMI Body mass index is 26.05 kg/m. GEN: Well nourished, well developed, in no acute distress. HEENT: normal. Neck: Supple, no JVD, carotid bruits, or masses. Cardiac:irregularly irregular, no murmurs, rubs, or gallops. No clubbing, cyanosis, edema.  Radials/DP/PT 2+ and equal bilaterally.  Respiratory:  Respirations regular and unlabored, clear to auscultation bilaterally. GI: Soft, nontender, nondistended, BS + x 4. MS: no deformity or atrophy. Skin: warm and dry, no rash. Neuro:  Strength and sensation are intact. Psych: Normal affect.  Accessory Clinical Findings    Recent Labs: 01/05/2022: BNP 684.9 03/04/2022: ALT 13; BUN 20; Creatinine, Ser 1.06; Hemoglobin 14.9; Magnesium 1.8; Platelets 216; Potassium 3.7; Sodium 138   Recent Lipid Panel    Component Value Date/Time   CHOL 184 05/19/2021 0834   TRIG 81.0 05/19/2021 0834   HDL 63.10 05/19/2021 0834   CHOLHDL 3 05/19/2021 0834   VLDL 16.2 05/19/2021 0834   LDLCALC 105 (H) 05/19/2021 0834   LDLDIRECT 121.2 06/17/2012 0931    ECG personally reviewed by me today-none today.  Echocardiogram 04/20/2021  IMPRESSIONS     1. Left ventricular ejection fraction, by estimation, is 55 to 60%. The  left ventricle has normal function. The left ventricle has no regional  wall motion abnormalities. There is mild left ventricular hypertrophy.  Left ventricular diastolic parameters  are indeterminate.   2. Right ventricular systolic function is low normal. The right  ventricular  size is normal. There is mildly elevated pulmonary artery  systolic pressure. The estimated right ventricular systolic pressure is  16.3 mmHg.   3. Right atrial size was mildly dilated.   4. The mitral valve is normal in structure. Mild mitral valve  regurgitation. No evidence of mitral stenosis.  5. Tricuspid valve regurgitation is mild to moderate.   6. The aortic valve is tricuspid. Aortic valve regurgitation is not  visualized. Mild aortic valve sclerosis is present, with no evidence of  aortic valve stenosis.   7. The inferior vena cava is normal in size with greater than 50%  respiratory variability, suggesting right atrial pressure of 3 mmHg.   8. Evidence of atrial level shunting detected by color flow Doppler.  There is a patent foramen ovale with predominantly left to right shunting  across the atrial septum.  Assessment & Plan   1.  Chronic diastolic CHF-euvolemic today.  Weight stable.  No increased DOE or activity intolerance.  Echocardiogram 6/22 showed normal LVEF mild-moderate TR, mild MR, and PFO. Continue furosemide, Heart healthy low-sodium diet-salty 6 given Increase physical activity as tolerated  Atrial fibrillation-heart rate today 92.  Reports compliance with apixaban and denies bleeding issues. Continue apixaban, Heart healthy low-sodium diet Increase physical activity as tolerated Avoid triggers caffeine, chocolate, EtOH, dehydration etc.  Essential hypertension-BP today 134/86.  Well-controlled at home. Continue furosemide Heart healthy low-sodium diet-salty 6 given Maintain blood pressure log  Chronic hypoxic respiratory failure/pulmonary disease-remains stable on home O2.  Denies cough. Continue current medication regimen Follows with pulmonology  Disposition: Follow-up with Dr.Schumann in 6 months.  Jossie Ng. Ares Cardozo NP-C    04/23/2022, 8:26 AM [address] Office [phone] Fax [phone]  Notice: This dictation was prepared with Dragon dictation along with smaller phrase technology. Any transcriptional errors that result from this process are unintentional and may not be corrected upon review.  I spent 14 minutes examining this patient, reviewing medications, and using patient centered shared decision making involving her cardiac care.  Prior to her visit I spent greater than 20 minutes reviewing her past medical history,  medications, and prior cardiac tests.

## 2022-04-23 ENCOUNTER — Encounter: Payer: Self-pay | Admitting: General Practice

## 2022-04-23 ENCOUNTER — Ambulatory Visit: Payer: Medicare HMO | Admitting: General Practice

## 2022-04-23 VITALS — BP 134/86 | HR 92 | Ht 62.0 in | Wt 142.4 lb

## 2022-04-23 DIAGNOSIS — I4821 Permanent atrial fibrillation: Secondary | ICD-10-CM

## 2022-04-23 DIAGNOSIS — J449 Chronic obstructive pulmonary disease, unspecified: Secondary | ICD-10-CM | POA: Diagnosis not present

## 2022-04-23 DIAGNOSIS — I5032 Chronic diastolic (congestive) heart failure: Secondary | ICD-10-CM

## 2022-04-23 DIAGNOSIS — I10 Essential (primary) hypertension: Secondary | ICD-10-CM

## 2022-04-23 NOTE — Patient Instructions (Signed)
Medication Instructions:  The current medical regimen is effective;  continue present plan and medications as directed. Please refer to the Current Medication list given to you today.   *If you need a refill on your cardiac medications before your next appointment, please call your pharmacy*  Lab Work:   Testing/Procedures:  NONE    NONE  If you have labs (blood work) drawn today and your tests are completely normal, you will receive your results only by: [place] (if you have MyChart) OR  A paper copy in the mail If you have any lab test that is abnormal or we need to change your treatment, we will call you to review the results.  Special Instructions PLEASE READ AND FOLLOW SALTY 6-ATTACHED-1,'800mg'$  daily  PLEASE MAINTAIN PHYSICAL ACTIVITY AS TOLERATED  CONTINUE TO TAKE YOUR WEIGHT DAILY   Follow-Up: Your next appointment:  6 month(s) In Person with [name], MD    At [facility], you and your health needs are our priority.  As part of our continuing mission to provide you with exceptional heart care, we have created designated Provider Care Teams.  These Care Teams include your primary Cardiologist (physician) and Advanced Practice Providers (APPs -  Physician Assistants and Nurse Practitioners) who all work together to provide you with the care you need, when you need it.   Important Information About Sugar               6 SALTY THINGS TO AVOID     1,'800MG'$  DAILY

## 2022-05-08 ENCOUNTER — Ambulatory Visit: Payer: Medicare HMO | Admitting: Pulmonary Disease

## 2022-05-08 ENCOUNTER — Encounter: Payer: Self-pay | Admitting: Pulmonary Disease

## 2022-05-08 VITALS — BP 140/82 | HR 78 | Temp 97.8°F | Ht 62.5 in | Wt 141.8 lb

## 2022-05-08 DIAGNOSIS — J432 Centrilobular emphysema: Secondary | ICD-10-CM

## 2022-05-08 NOTE — Progress Notes (Signed)
Subjective:   PATIENT ID: Annette [name] GENDER: female DOB: [birth year], MRN: [id number]   HPI  Chief Complaint  Patient presents with   Follow-up    Pt is experiencing some SOB on exertion, currently on 2L/m of O2    Reason for Visit: Follow-up emphysema  Ms. Annette [name] is a 88 year old female with chronic hypoxemic respiratory failure, chronic diastolic heart failure, atrial fibrillation, chronic pulmonary embolism dx 11/12/19, hypertension, GERD, history of pulmonary nodules and primary hyperparathyroidism who presents for follow-up.   Initial consul Referred by Dr. Bjorn Pippin. Her son Annette [name] is present with her and provides additional history. She was seen by cardiology on 01/05/2022 with Dr. Bjorn Pippin.  Has been diuresed for acute on chronic diastolic heart failure and was noted to be hypervolemic on exam.  Of note she was recently hospitalized in February for this issue and had CT chest completed.  Since her hospitalization she has continued to require home oxygen.  With her recent CT demonstrating emphysema and small chronic PE, she was referred to pulmonary for further evaluation. She is active and able to care for herself. Describes herself as sedentary but able to ambulate independently with walker. She has shortness of breath and wheezing related to her heart issues. This has improved but will still feel short of breath with minimal activity. Has been compliant with her oxygen at rest and with activity, 2L via [place]. She is anxious about taking off her oxygen.  05/08/2022 She reports is compliant with her oxygen. Denies shortness of breath, cough or wheezing. She is able to cook and care for herself. She does not have an exercise routine. She is homebound but is content with that. Denies recent respiratory infections in the last 12 months.  Social History: Quit smoking 50 years ago however describes herself as social smoker. She is unclear why she has COPD Second hand  exposure Her son Annette [name] is involved in her medical care  Past Medical History:  Diagnosis Date   GERD 10/03/2007   HYPERTENSION 10/03/2007   OSTEOPENIA 02/03/2008   Primary hyperparathyroidism (HCC) 10/03/2007     Family History  Problem Relation Age of Onset   Hypercalcemia Neg Hx      Social History   Occupational History   Occupation: retired  Tobacco Use   Smoking status: Former    Types: Cigarettes    Quit date: 11/12/1978    Years since quitting: 43.5   Smokeless tobacco: Never  Substance and Sexual Activity   Alcohol use: No   Drug use: No   Sexual activity: Not on file    Allergies  Allergen Reactions   Codeine Nausea And Vomiting     Outpatient Medications Prior to Visit  Medication Sig Dispense Refill   acetaminophen (TYLENOL) 500 MG tablet Take 500 mg by mouth 3 (three) times daily.     apixaban (ELIQUIS) 5 MG TABS tablet Take 1 tablet (5 mg total) by mouth 2 (two) times daily. 60 tablet 0   cinacalcet (SENSIPAR) 30 MG tablet TAKE 1 TABLET EVERY DAY (Patient taking differently: Take 30 mg by mouth.) 90 tablet 3   furosemide (LASIX) 40 MG tablet Take 0.5 tablets (20 mg total) by mouth daily. 90 tablet 3   latanoprost (XALATAN) 0.005 % ophthalmic solution INSTILL 1 DROP INTO BOTH EYES AT BEDTIME (Patient taking differently: Place 1 drop into both eyes at bedtime.) 7.5 mL 3   Magnesium Oxide 400 MG CAPS Take 1 capsule (400 mg total) by mouth daily.  90 capsule 3   omeprazole (PRILOSEC) 40 MG capsule Take 1 capsule (40 mg total) by mouth daily. 90 capsule 3   OXYGEN Inhale into the lungs.     pravastatin (PRAVACHOL) 20 MG tablet TAKE 1 TABLET EVERY DAY (Patient taking differently: Take 20 mg by mouth at bedtime.) 90 tablet 2   vitamin B-12 (CYANOCOBALAMIN) 1000 MCG tablet Take 1,000 mcg by mouth 2 (two) times a week. Tuesday and thursday     VITAMIN D PO Take 1,000 Units by mouth at bedtime.     Tiotropium Bromide Monohydrate (SPIRIVA RESPIMAT) 2.5 MCG/ACT AERS  Inhale 2 puffs into the lungs daily. (Patient not taking: Reported on 05/08/2022) 4 g 5   Tiotropium Bromide Monohydrate (SPIRIVA RESPIMAT) 2.5 MCG/ACT AERS Inhale 2 puffs into the lungs daily. (Patient not taking: Reported on 04/23/2022) 4 g 0   No facility-administered medications prior to visit.    ROS   Objective:   Vitals:   05/08/22 0908  BP: 140/82  Pulse: 78  Temp: 97.8 F (36.6 C)  TempSrc: Oral  SpO2: 98%  Weight: 141 lb 12.8 oz (64.3 kg)  Height: 5' 2.5" (1.588 m)   SpO2: 98 % (2L)  Physical Exam: General: Frail-appearing, no acute distress, on [place] HENT: , AT Eyes: EOMI, no scleral icterus Respiratory: Diminished but clear to auscultation bilaterally.  No crackles, wheezing or rales Cardiovascular: RRR, -M/R/G, no JVD Extremities:-Edema,-tenderness Neuro: AAO x4, CNII-XII grossly intact Psych: Normal mood, normal affect  Data Reviewed:  Imaging: CTA 11/12/2019-acute PE in the right lung distal main pulmonary artery extending to subsegmental branches.  Subsegmental PE in the left upper and lower lobes.  2 pulmonary nodules in the right lung measuring 5 mm.  Linear scarring in the superior segment of the left lower lobe CT chest 12/19/2021-small chronic pulmonary emboli lower lobe corresponding to PE and 11/12/2019.  Emphysema.  Small right pleural effusion with atelectasis.  Right lower lobe nodule measuring 5 mm, unchanged. Similar left lung scarring seen compared to 2020.  PFT: None on file  Cardiac: Echo 04/20/2021-EF 55 to 60%.  No WMA.  Mild LVH.  Indeterminate diastolic parameters.  RV systolic function low normal.  Mildly elevated PASP.  RVSP 35.3. Mild dilation RA.  Mild MR.  Mild/moderate TR.  PFO with left-to-right shunting  Labs: CBC    Component Value Date/Time   WBC 5.8 03/04/2022 1404   RBC 4.89 03/04/2022 1404   HGB 14.9 03/04/2022 1404   HGB 15.1 03/24/2021 1119   HCT 44.9 03/04/2022 1404   HCT 44.9 03/24/2021 1119   PLT 216 03/04/2022 1404    PLT 229 03/24/2021 1119   MCV 91.8 03/04/2022 1404   MCV 90 03/24/2021 1119   MCH 30.5 03/04/2022 1404   MCHC 33.2 03/04/2022 1404   RDW 13.3 03/04/2022 1404   RDW 12.3 03/24/2021 1119   LYMPHSABS 1.3 12/19/2021 1013   MONOABS 0.9 12/19/2021 1013   EOSABS 0.1 12/19/2021 1013   BASOSABS 0.0 12/19/2021 1013   Absolute eos 12/19/21 - 100     Latest Ref Rng & Units 03/04/2022    2:04 PM 01/22/2022    9:14 AM 01/05/2022   10:19 AM  BMP  Glucose 70 - 99 mg/dL 191  478  85   BUN 8 - 23 mg/dL 20  19  14    Creatinine 0.44 - 1.00 mg/dL 2.95  6.21  3.08   BUN/Creat Ratio 12 - 28  22  15    Sodium 135 - 145  mmol/L 138  138  137   Potassium 3.5 - 5.1 mmol/L 3.7  4.3  3.9   Chloride 98 - 111 mmol/L 100  96  95   CO2 22 - 32 mmol/L 26  27  27    Calcium 8.9 - 10.3 mg/dL 9.9  16.1  9.5        Assessment & Plan:   Discussion: 88 year old female with emphysema, chronic hypoxemic respiratory failure, chronic diastolic heart failure, atrial fibrillation, chronic pulmonary embolism diagnosed 12 01/11/2019, HTN, GERD, history of pulm nodules and primary hyperparathyroidism who presents for follow-up. Reports her symptoms are well controlled on oxygen. Not using bronchodilators due to cost.  Emphysema --Patient currently not bronchodilators --Ok to hold off on using. Please contact us if you wish to start treatment for shortness of breath, cough or wheezing.  Chronic hypoxemic respiratory failure Exertional hypoxemia --CONTINUE supplemental oxygen for goal SpO2 >88% --CONTINUE 2L with activity and sleep  Goals of Care She states if she were to have a life-threatening illness, she would not want CPR or life support. Her husband was in hospice and she believes that would a positive experience for her when her time comes. She would still want treatment including medication or fluids for any treatable conditions. --Please discuss with your family to update them on your goals  Health  Maintenance Immunization History  Administered Date(s) Administered   Fluad Quad(high Dose 65+) 07/15/2019, 08/31/2020   Influenza Split 08/22/2011   Influenza Whole 08/10/2010   Influenza, High Dose Seasonal PF 08/04/2014, 08/05/2016, 08/04/2017, 07/24/2018   Influenza,inj,Quad PF,6+ Mos 08/10/2013   Influenza-Unspecified 07/27/2017, 09/21/2021   PFIZER(Purple Top)SARS-COV-2 Vaccination 01/10/2020, 02/03/2020, 02/22/2021   Pneumococcal Conjugate-13 08/10/2014   Pneumococcal Polysaccharide-23 08/10/2013   Tdap 08/31/2020   CT Lung Screen- not qualified due to age  No orders of the defined types were placed in this encounter.  No orders of the defined types were placed in this encounter.   No follow-ups on file.  I have spent a total time of 30-minutes on the day of the appointment reviewing prior documentation, coordinating care and discussing medical diagnosis and plan with the patient/family. Past medical history, allergies, medications were reviewed. Pertinent imaging, labs and tests included in this note have been reviewed and interpreted independently by me.  Allon Costlow Mechele Collin, MD [facility] 05/08/2022 9:31 AM  Office Number [phone]

## 2022-06-04 ENCOUNTER — Ambulatory Visit: Payer: Medicare HMO | Admitting: Family Medicine

## 2022-06-04 NOTE — Progress Notes (Signed)
HPI: Annette [name] is a 88 y.o. female, who is here today to follow on recent visit. She was last seen on 03/06/22 for ED follow up. Since her last visit she has seen pulmonologist,cardiologist, and eye care provider. Last visit Amlodipine was discontinued because episodes of presyncope and hypotension. Still having some episodes of symptomatic hypotension, lightheadedness. She is not on pharmacologic treatment. Some SBP 130-140 but most in the low 100's and 90's. She drinks plenty of water durig the day, her son gives her garoride powder to add to her water sometimes.  No associated CP,palpitations,orthopnea,PND,or edema. Morning headache "once in a while" and better after a few minutes of being up.  Lab Results  Component Value Date   CREATININE 1.06 (H) 03/04/2022   BUN 20 03/04/2022   NA 138 03/04/2022   K 3.7 03/04/2022   CL 100 03/04/2022   CO2 26 03/04/2022   COPD with chronic hypoxic respiratory failure: Her pulmonologist prescribed Combivent respimat, which she cannot afford inhale. DOE has improved. Her O2 compressor broke, so she did not have O2 for a few hours and felt SOB.  States that she was told to use supplemental O2 as needed with activity and with sleep but she keeps it continuously, 2 LPM afraid of stopping it.  Negative for cough and wheezing.  Nose bleed, she had an episode yesterday after trying to remove a scab. It happened around 9 am, lasted about 5 min, from right nostril. Feels like O2 is drying ehr nose. This is her 2nd episode since 02/2022.  She has not noted gum bleeding,melena,or blood in stool. Atrial fib on Eliquis 5 mg bid. She did not take Eliquis x 2.  She would like to have Ca++ check today. Primary hyperparathyroidism, follows with endocrinologist. Her Ca++ has improved with Sensipar 30 mg daily.  Review of Systems  Constitutional:  Negative for activity change, appetite change and fever.  HENT:  Positive for nosebleeds.  Negative for mouth sores.   Eyes:  Negative for redness and visual disturbance.  Gastrointestinal:  Negative for abdominal pain, nausea and vomiting.       Negative for changes in bowel habits.  Genitourinary:  Negative for decreased urine volume and hematuria.  Musculoskeletal:  Positive for gait problem.  Skin:  Negative for rash.  Neurological:  Negative for syncope and facial asymmetry.  Psychiatric/Behavioral:  Negative for confusion. The patient is nervous/anxious.   Rest see pertinent positives and negatives per HPI.  Current Outpatient Medications on File Prior to Visit  Medication Sig Dispense Refill   acetaminophen (TYLENOL) 500 MG tablet Take 500 mg by mouth 3 (three) times daily.     apixaban (ELIQUIS) 5 MG TABS tablet Take 1 tablet (5 mg total) by mouth 2 (two) times daily. 60 tablet 0   cinacalcet (SENSIPAR) 30 MG tablet TAKE 1 TABLET EVERY DAY (Patient taking differently: Take 30 mg by mouth.) 90 tablet 3   furosemide (LASIX) 40 MG tablet Take 0.5 tablets (20 mg total) by mouth daily. 90 tablet 3   latanoprost (XALATAN) 0.005 % ophthalmic solution INSTILL 1 DROP INTO BOTH EYES AT BEDTIME (Patient taking differently: Place 1 drop into both eyes at bedtime.) 7.5 mL 3   Magnesium Oxide 400 MG CAPS Take 1 capsule (400 mg total) by mouth daily. 90 capsule 3   omeprazole (PRILOSEC) 40 MG capsule Take 1 capsule (40 mg total) by mouth daily. 90 capsule 3   OXYGEN Inhale into the lungs.  pravastatin (PRAVACHOL) 20 MG tablet TAKE 1 TABLET EVERY DAY (Patient taking differently: Take 20 mg by mouth at bedtime.) 90 tablet 2   vitamin B-12 (CYANOCOBALAMIN) 1000 MCG tablet Take 1,000 mcg by mouth 2 (two) times a week. Tuesday and thursday     VITAMIN D PO Take 1,000 Units by mouth at bedtime.     No current facility-administered medications on file prior to visit.   Past Medical History:  Diagnosis Date   GERD 10/03/2007   HYPERTENSION 10/03/2007   OSTEOPENIA 02/03/2008   Primary  hyperparathyroidism ([place]) 10/03/2007   Allergies  Allergen Reactions   Codeine Nausea And Vomiting    Social History   Socioeconomic History   Marital status: Widowed    Spouse name: Not on file   Number of children: Not on file   Years of education: Not on file   Highest education level: Not on file  Occupational History   Occupation: retired  Tobacco Use   Smoking status: Former    Types: Cigarettes    Quit date: 11/12/1978    Years since quitting: 43.6   Smokeless tobacco: Never  Substance and Sexual Activity   Alcohol use: No   Drug use: No   Sexual activity: Not on file  Other Topics Concern   Not on file  Social History Narrative   Pt states her son and daughter in law live with her   Social Determinants of Health   Financial Resource Strain: [place]  (07/12/2021)   Overall Financial Resource Strain (CARDIA)    Difficulty of Paying Living Expenses: Not hard at all  Food Insecurity: No [place] (07/12/2021)   Hunger Vital Sign    Worried About Running Out of Food in the Last Year: Never true    [place] in the Last Year: Never true  Transportation Needs: No Transportation Needs (07/12/2021)   PRAPARE - Hydrologist (Medical): No    Lack of Transportation (Non-Medical): No  Physical Activity: Inactive (07/12/2021)   Exercise Vital Sign    Days of Exercise per Week: 0 days    Minutes of Exercise per Session: 0 min  Stress: No Stress Concern Present (07/12/2021)   [place]    Feeling of Stress : Not at all  Social Connections: Moderately Isolated (07/12/2021)   Social Connection and Isolation Panel [NHANES]    Frequency of Communication with Friends and Family: More than three times a week    Frequency of Social Gatherings with Friends and Family: Never    Attends Religious Services: More than 4 times per year    Active Member of [employer] or Organizations: No     Attends Archivist Meetings: Never    Marital Status: Widowed   Vitals:   06/05/22 0910  BP: 128/80  Pulse: 67  Resp: 16  SpO2: 99%   Body mass index is 25.2 kg/m.  Physical Exam Vitals and nursing note reviewed.  Constitutional:      General: She is not in acute distress.    Appearance: She is well-developed.  HENT:     Head: Normocephalic and atraumatic.     Mouth/Throat:     Mouth: Mucous membranes are moist.     Pharynx: Oropharynx is clear.  Eyes:     Conjunctiva/sclera: Conjunctivae normal.  Cardiovascular:     Rate and Rhythm: Normal rate. Rhythm irregular.     Heart sounds: No murmur  heard. Pulmonary:     Effort: Pulmonary effort is normal. No respiratory distress.     Breath sounds: Normal breath sounds.  Abdominal:     Palpations: Abdomen is soft.     Tenderness: There is no abdominal tenderness.  Lymphadenopathy:     Cervical: No cervical adenopathy.  Skin:    General: Skin is warm.     Findings: No erythema or rash.  Neurological:     General: No focal deficit present.     Mental Status: She is alert and oriented to person, place, and time.     Comments: Unstable gait assisted with a walker.  Psychiatric:        Mood and Affect: Mood is anxious.     Comments: Well groomed, good eye contact.   ASSESSMENT AND PLAN:  Ms. Randie was seen today for follow-up.  Diagnoses and all orders for this visit: Orders Placed This Encounter  Procedures   Basic metabolic panel   Lab Results  Component Value Date   CREATININE 0.79 06/05/2022   BUN 14 06/05/2022   NA 130 (L) 06/05/2022   K 4.0 06/05/2022   CL 91 (L) 06/05/2022   CO2 31 06/05/2022   Chronic respiratory failure with hypoxia (HCC) Continue O2 supplementation, ideally as needed during the day to keep O2 sat >88% and while sleeping. She prefers continuously, 2 LPM per [place]. Following with pulmonologist.  Essential hypertension Continue non pharmacologic treatment. Adequate  hydration and can drink Gatorade once per day, may help with prevention of episodes of hypotension. Continue monitoring BP regularly.  Epistaxis O2 supplementation per [place] most likely aggravating problems in combination with chronic anticoagulation. Recommend [place], small amount on septum daily as needed and avoid nose picking.  Centrilobular emphysema (HCC) Samples of Spiriva respimat 2.5 mcg given to continue 2 puff daily. Following with pulmonologist.  -     Tiotropium Bromide Monohydrate (SPIRIVA RESPIMAT) 2.5 MCG/ACT AERS; Inhale 2 puffs into the lungs daily.  Persistent atrial fibrillation with rapid ventricular response (HCC) We discussed some side effects of Eliquis as well as benefits. Stressed the importance of not skipping medication unless serious bleeding occurs; in which case she needs to let us know. Following with cardiologist.  Primary hyperparathyroidism [facility]) She has an appt to established with new endocrinologist, DR Cruzita Lederer on 08/09/22. Ca++ has improved. Continue Sensipar 30 mg daily.  I spent a total of 44 minutes in both face to face and non face to face activities for this visit on the date of this encounter. During this time history was obtained and documented, examination was performed, prior labs reviewed, and assessment/plan discussed.  Return in about 6 months (around 12/06/2022) for CPE.  Maniyah Moller G. [place], MD  [facility]. [place] office.

## 2022-06-05 ENCOUNTER — Encounter: Payer: Self-pay | Admitting: Family Medicine

## 2022-06-05 ENCOUNTER — Ambulatory Visit (INDEPENDENT_AMBULATORY_CARE_PROVIDER_SITE_OTHER): Payer: Medicare HMO | Admitting: Family Medicine

## 2022-06-05 VITALS — BP 128/80 | HR 67 | Resp 16 | Ht 62.5 in | Wt 140.0 lb

## 2022-06-05 DIAGNOSIS — J432 Centrilobular emphysema: Secondary | ICD-10-CM

## 2022-06-05 DIAGNOSIS — J9611 Chronic respiratory failure with hypoxia: Secondary | ICD-10-CM

## 2022-06-05 DIAGNOSIS — I10 Essential (primary) hypertension: Secondary | ICD-10-CM

## 2022-06-05 DIAGNOSIS — R040 Epistaxis: Secondary | ICD-10-CM

## 2022-06-05 DIAGNOSIS — I4819 Other persistent atrial fibrillation: Secondary | ICD-10-CM | POA: Diagnosis not present

## 2022-06-05 DIAGNOSIS — E210 Primary hyperparathyroidism: Secondary | ICD-10-CM

## 2022-06-05 LAB — BASIC METABOLIC PANEL
BUN: 14 mg/dL (ref 6–23)
CO2: 31 mEq/L (ref 19–32)
Calcium: 10.7 mg/dL — ABNORMAL HIGH (ref 8.4–10.5)
Chloride: 91 mEq/L — ABNORMAL LOW (ref 96–112)
Creatinine, Ser: 0.79 mg/dL (ref 0.40–1.20)
GFR: 66.23 mL/min (ref >60.00)
Glucose, Bld: 90 mg/dL (ref 70–99)
Potassium: 4 mEq/L (ref 3.5–5.1)
Sodium: 130 mEq/L — ABNORMAL LOW (ref 135–145)

## 2022-06-05 NOTE — Patient Instructions (Addendum)
A few things to remember from today's visit:  Essential hypertension - Plan: Basic metabolic panel  Epistaxis  If you need refills please call your pharmacy. Do not use My Chart to request refills or for acute issues that need immediate attention.   Oxygen can be drying your nose. Small amount of [place] on nose mucosa and nasal saline irrigation. Do not skip Eliquis. You can add gatorade powder to one of your water bottles to help with blood rpessure. Keep appt with endocrinologist.  Please be sure medication list is accurate. If a new problem present, please set up appointment sooner than planned today.

## 2022-06-08 MED ORDER — SPIRIVA RESPIMAT 2.5 MCG/ACT IN AERS: 2.0000 | INHALATION_SPRAY | Freq: Every day | RESPIRATORY_TRACT | 5 refills | Status: DC

## 2022-06-11 ENCOUNTER — Encounter (HOSPITAL_BASED_OUTPATIENT_CLINIC_OR_DEPARTMENT_OTHER): Payer: Self-pay | Admitting: Obstetrics and Gynecology

## 2022-06-11 ENCOUNTER — Emergency Department (HOSPITAL_BASED_OUTPATIENT_CLINIC_OR_DEPARTMENT_OTHER)
Admission: EM | Admit: 2022-06-11 | Discharge: 2022-06-11 | Disposition: A | Discharge status: Home / Self Care | Payer: Medicare HMO | Attending: Emergency Medicine | Admitting: Emergency Medicine

## 2022-06-11 ENCOUNTER — Other Ambulatory Visit (HOSPITAL_BASED_OUTPATIENT_CLINIC_OR_DEPARTMENT_OTHER): Payer: Self-pay

## 2022-06-11 ENCOUNTER — Other Ambulatory Visit: Payer: Self-pay

## 2022-06-11 DIAGNOSIS — Z7901 Long term (current) use of anticoagulants: Secondary | ICD-10-CM | POA: Diagnosis not present

## 2022-06-11 DIAGNOSIS — J449 Chronic obstructive pulmonary disease, unspecified: Secondary | ICD-10-CM | POA: Insufficient documentation

## 2022-06-11 DIAGNOSIS — I509 Heart failure, unspecified: Secondary | ICD-10-CM | POA: Diagnosis not present

## 2022-06-11 DIAGNOSIS — I10 Essential (primary) hypertension: Secondary | ICD-10-CM | POA: Diagnosis not present

## 2022-06-11 DIAGNOSIS — I110 Hypertensive heart disease with heart failure: Secondary | ICD-10-CM | POA: Insufficient documentation

## 2022-06-11 DIAGNOSIS — R040 Epistaxis: Secondary | ICD-10-CM | POA: Insufficient documentation

## 2022-06-11 DIAGNOSIS — R58 Hemorrhage, not elsewhere classified: Secondary | ICD-10-CM | POA: Diagnosis not present

## 2022-06-11 HISTORY — DX: Acute embolism and thrombosis of unspecified deep veins of unspecified lower extremity: I82.409

## 2022-06-11 LAB — CBC WITH DIFFERENTIAL/PLATELET
Abs Immature Granulocytes: 0.02 10*3/uL (ref 0.00–0.07)
Basophils Absolute: 0 10*3/uL (ref 0.0–0.1)
Basophils Relative: 0 %
Eosinophils Absolute: 0 10*3/uL (ref 0.0–0.5)
Eosinophils Relative: 0 %
HCT: 45 % (ref 36.0–46.0)
Hemoglobin: 14.9 g/dL (ref 12.0–15.0)
Immature Granulocytes: 0 %
Lymphocytes Relative: 8 %
Lymphs Abs: 0.7 10*3/uL (ref 0.7–4.0)
MCH: 30.8 pg (ref 26.0–34.0)
MCHC: 33.1 g/dL (ref 30.0–36.0)
MCV: 93 fL (ref 80.0–100.0)
Monocytes Absolute: 0.9 10*3/uL (ref 0.1–1.0)
Monocytes Relative: 10 %
Neutro Abs: 7.4 10*3/uL (ref 1.7–7.7)
Neutrophils Relative %: 82 %
Platelets: 200 10*3/uL (ref 150–400)
RBC: 4.84 MIL/uL (ref 3.87–5.11)
RDW: 12.3 % (ref 11.5–15.5)
WBC: 9.1 10*3/uL (ref 4.0–10.5)
nRBC: 0 % (ref 0.0–0.2)

## 2022-06-11 LAB — BASIC METABOLIC PANEL
Anion gap: 8 (ref 5–15)
BUN: 19 mg/dL (ref 8–23)
CO2: 33 mmol/L — ABNORMAL HIGH (ref 22–32)
Calcium: 10.5 mg/dL — ABNORMAL HIGH (ref 8.9–10.3)
Chloride: 95 mmol/L — ABNORMAL LOW (ref 98–111)
Creatinine, Ser: 0.79 mg/dL (ref 0.44–1.00)
GFR, Estimated: >60 mL/min (ref >60)
Glucose, Bld: 89 mg/dL (ref 70–99)
Potassium: 4 mmol/L (ref 3.5–5.1)
Sodium: 136 mmol/L (ref 135–145)

## 2022-06-11 LAB — PROTIME-INR
INR: 1.5 — ABNORMAL HIGH (ref 0.8–1.2)
Prothrombin Time: 18.3 seconds — ABNORMAL HIGH (ref 11.4–15.2)

## 2022-06-11 MED ORDER — TRANEXAMIC ACID 1000 MG/10ML IV SOLN
500.0000 mg | Freq: Once | INTRAVENOUS | Status: AC
  Administered 2022-06-11: 500 mg via TOPICAL
  Filled 2022-06-11: qty 10

## 2022-06-11 MED ORDER — LIDOCAINE-EPINEPHRINE 2 %-1:100000 IJ SOLN: 20.0000 mL | Freq: Once | INTRAMUSCULAR | Status: DC

## 2022-06-11 MED ORDER — CEPHALEXIN 500 MG PO CAPS
500.0000 mg | ORAL_CAPSULE | Freq: Three times a day (TID) | ORAL | 0 refills | Status: DC
  Filled 2022-06-11: qty 21, 7d supply, fill #0

## 2022-06-11 MED ORDER — LIDOCAINE-EPINEPHRINE (PF) 2 %-1:200000 IJ SOLN
INTRAMUSCULAR | Status: AC
  Filled 2022-06-11: qty 20

## 2022-06-11 MED ORDER — OXYMETAZOLINE HCL 0.05 % NA SOLN
1.0000 | Freq: Once | NASAL | Status: AC
  Administered 2022-06-11: 1 via NASAL
  Filled 2022-06-11: qty 30

## 2022-06-11 NOTE — ED Notes (Signed)
RT note: Pt. arrived via [facility] with Nose bleed, uses 2 lpm n/c @ home, continued Simple mask from [facility] placement @ 4 lpm, RT to monitor.

## 2022-06-11 NOTE — Discharge Instructions (Addendum)
Keep packing in place until you see the ear nose and throat doctor in the office later this week.  Hold your Eliquis for 2 days while your nose is healing. wear your oxygen through 1 nostril as we discussed.  Take the antibiotics as prescribed.  If bleeding recurs, hold pressure for at least 30 minutes before returning to the ED. return to the ED sooner with chest pain, shortness of breath, dizziness, lightheadedness, recurrent nosebleeds or any other concerns.

## 2022-06-11 NOTE — ED Notes (Signed)
RT note: Pt. placed on Face Tent '@50'$ %/12 lpm, uses 2 lpm n/c @ home, tolerating well, aware of call button.

## 2022-06-11 NOTE — ED Triage Notes (Signed)
Patient reports to the ER via EMS from home. Patient reportedly started having bleeding this morning. Patient has a hx of COPD and CHF. Denies injury or fall. Patient is on Eliquis and oxygen 2L full time.

## 2022-06-11 NOTE — ED Provider Notes (Signed)
[facility] Provider Note   CSN: [id number] Arrival date & time: 06/11/22  1218     History  Chief Complaint  Patient presents with   Epistaxis    Annette [name] is a 88 y.o. female.  Patient with a history of DVT/atrial fibrillation on Eliquis, hypertension, COPD on home oxygen presenting with nosebleed ongoing since she woke up this morning.  Denies any fall or trauma.  Has had intermittent bleeds in the past but never this severe.  She has a history of COPD and CHF and is on 2 L of oxygen all the time.  Bleeding has been ongoing for approximately the past 2 hours.  She denies any dizziness or lightheadedness.  No chest pain or shortness of breath.  No cough or fever.  The history is provided by the patient and the EMS personnel.  Epistaxis      Home Medications Prior to Admission medications   Medication Sig Start Date End Date Taking? Authorizing Provider  acetaminophen (TYLENOL) 500 MG tablet Take 500 mg by mouth 3 (three) times daily.    [provider]  apixaban (ELIQUIS) 5 MG TABS tablet Take 1 tablet (5 mg total) by mouth 2 (two) times daily. 03/24/21   [name], MD  cinacalcet (SENSIPAR) 30 MG tablet TAKE 1 TABLET EVERY DAY Patient taking differently: Take 30 mg by mouth. 11/20/21   Renato Shin, MD  furosemide (LASIX) 40 MG tablet Take 0.5 tablets (20 mg total) by mouth daily. 03/06/22   Martinique, [name], MD  latanoprost (XALATAN) 0.005 % ophthalmic solution INSTILL 1 DROP INTO BOTH EYES AT BEDTIME Patient taking differently: Place 1 drop into both eyes at bedtime. 10/29/17   Marletta Lor, MD  Magnesium Oxide 400 MG CAPS Take 1 capsule (400 mg total) by mouth daily. 01/08/22   [name], MD  omeprazole (PRILOSEC) 40 MG capsule Take 1 capsule (40 mg total) by mouth daily. 12/29/21   Martinique, [name], MD  OXYGEN Inhale into the lungs.    [provider]  pravastatin (PRAVACHOL) 20 MG tablet  TAKE 1 TABLET EVERY DAY Patient taking differently: Take 20 mg by mouth at bedtime. 09/06/21   [place], [name], MD  Tiotropium Bromide Monohydrate (SPIRIVA RESPIMAT) 2.5 MCG/ACT AERS Inhale 2 puffs into the lungs daily. 06/08/22   Martin[place], [name], MD  vitamin B-12 (CYANOCOBALAMIN) 1000 MCG tablet Take 1,000 mcg by mouth 2 (two) times a week. Tuesday and thursday    [provider]  VITAMIN D PO Take 1,000 Units by mouth at bedtime.    [provider]      Allergies    Codeine    Review of Systems   Review of Systems  HENT:  Positive for nosebleeds.     Physical Exam Updated Vital Signs BP (!) 174/106 (BP Location: Right Arm)   Pulse 90   Resp 19   Ht '5\' 2"'$  (1.575 m)   Wt 64 kg   SpO2 98%   BMI 25.81 kg/m  Physical Exam Vitals and nursing note reviewed.  Constitutional:      General: She is not in acute distress.    Appearance: She is well-developed.  HENT:     Head: Normocephalic and atraumatic.     Nose:     Comments: Active bleeding from right nare with clot.  There is a pulsatile vessel to the nasal septum anteriorly on the right. Blood in posterior pharynx with clot  Mouth/Throat:     Pharynx: No oropharyngeal exudate.  Eyes:     Conjunctiva/sclera: Conjunctivae normal.     Pupils: Pupils are equal, round, and reactive to light.  Neck:     Comments: No meningismus. Cardiovascular:     Rate and Rhythm: Normal rate and regular rhythm.     Heart sounds: Normal heart sounds. No murmur heard. Pulmonary:     Effort: Pulmonary effort is normal. No respiratory distress.     Breath sounds: Normal breath sounds.  Chest:     Chest wall: No tenderness.  Abdominal:     Palpations: Abdomen is soft.     Tenderness: There is no abdominal tenderness. There is no guarding or rebound.  Musculoskeletal:        General: No tenderness. Normal range of motion.     Cervical back: Normal range of motion and neck supple.  Skin:    General: Skin is warm.   Neurological:     Mental Status: She is alert and oriented to person, place, and time.     Cranial Nerves: No cranial nerve deficit.     Motor: No abnormal muscle tone.     Coordination: Coordination normal.     Comments:  5/5 strength throughout. CN 2-12 intact.Equal grip strength.   Psychiatric:        Behavior: Behavior normal.     ED Results / Procedures / Treatments   Labs (all labs ordered are listed, but only abnormal results are displayed) Labs Reviewed  BASIC METABOLIC PANEL - Abnormal; Notable for the following components:      Result Value   Chloride 95 (*)    CO2 33 (*)    Calcium 10.5 (*)    All other components within normal limits  PROTIME-INR - Abnormal; Notable for the following components:   Prothrombin Time 18.3 (*)    INR 1.5 (*)    All other components within normal limits  CBC WITH DIFFERENTIAL/PLATELET    EKG None  Radiology No results found.  Procedures .Epistaxis Management  Date/Time: 06/11/2022 12:52 PM  Performed by: Ezequiel Essex, MD Authorized by: Ezequiel Essex, MD   Consent:    Consent obtained:  Verbal and emergent situation   Consent given by:  Patient   Risks, benefits, and alternatives were discussed: yes     Risks discussed:  Bleeding, infection, nasal injury and pain   Alternatives discussed:  No treatment Universal protocol:    Procedure explained and questions answered to patient or proxy's satisfaction: yes     Relevant documents present and verified: yes     Immediately prior to procedure, a time out was called: yes     Patient identity confirmed:  Verbally with patient Anesthesia:    Anesthesia method:  Topical application   Topical anesthetic:  Lidocaine gel Procedure details:    Treatment site:  R anterior   Treatment method:  Silver nitrate, thrombin, gel foam, nasal tampon and anterior pack   Treatment complexity:  Extensive   Treatment episode: recurring   Post-procedure details:    Assessment:   Bleeding decreased   Procedure completion:  Tolerated Comments:     Used Afrin, lidocaine with epinephrine, TXA, chemical electric cautery and ultimately 5.5 cm Rhino Rocket.     Medications Ordered in ED Medications  oxymetazoline (AFRIN) 0.05 % nasal spray 1 spray (has no administration in time range)  lidocaine-EPINEPHrine (XYLOCAINE W/EPI) 2 %-1:100000 (with pres) injection 20 mL (has no administration in time range)  tranexamic  acid (CYKLOKAPRON) injection 500 mg (has no administration in time range)  lidocaine-EPINEPHrine (XYLOCAINE W/EPI) 2 %-1:200000 (PF) injection (has no administration in time range)    ED Course/ Medical Decision Making/ A&P                           Medical Decision Making Amount and/or Complexity of Data Reviewed Labs: ordered. Decision-making details documented in ED Course. Radiology: ordered and independent interpretation performed. Decision-making details documented in ED Course. ECG/medicine tests: ordered and independent interpretation performed. Decision-making details documented in ED Course.  Risk OTC drugs. Prescription drug management.  Patient with chronic oxygen use as well as anticoagulation use here with atraumatic epistaxis.  Vitals are stable, no distress.  Pulsatile vessel seen on the septum on the right.  Bleeding dressed with Afrin, TXA, lidocaine with epinephrine as well as chemical cauterization and electrical cauterization  Patient observed in the ED with no recurrence of bleeding.  Will initiate antibiotic prophylaxis and referred to ENT for removal later this week.  She is maintaining her oxygenation with nasal cannula will be using 1 nostril in her nonpacked nostril.  Discussed with Dr. Constance Holster of ENT.  He wishes to see patient in the office on Thursday or Friday for packing removal.  Patient and son are comfortable with discharge home with nasal cannula oxygen going through one nostril.  She feels like she is breathing at  baseline.  Prophylactic antibiotics will be given/  Return precautions discussed including worsening bleeding, chest pain, shortness of breath, dizziness or lightheadedness or any other concerns.        Final Clinical Impression(s) / ED Diagnoses Final diagnoses:  Epistaxis    Rx / DC Orders ED Discharge Orders     None         Judah Carchi, Annie Main, MD 06/11/22 1539

## 2022-06-11 NOTE — ED Notes (Signed)
RT note: Pt. currently on 3 lpm n/c only in L Nare side due to R Nare remaining occluded after Nasal Rhino Rocket was placed due to [place]. Pt. uses N/C at home and was transported by [facility] to [facility] on 3 lpm n/c only placed in L Nare. MD plan is to d/c pt. back home with follow-up with ENT. Mentioned PTAR due to currently being on Oxygen, RT to follow.

## 2022-06-13 ENCOUNTER — Telehealth: Payer: Self-pay | Admitting: *Deleted

## 2022-06-13 NOTE — Telephone Encounter (Signed)
     Patient  visit on 06/11/2022  at [facility] ed was for Bleeding in her nose   Have you been able to follow up with your primary care physician?Patient has followup appt on friday and son is going to take her  The patient was or was not able to obtain any needed medicine or equipment. Yes   Patient expresses understanding of instructions and education provided has no other needs at this time.    [facility] [phone] , [place] [address] Email : [email or web address]

## 2022-06-15 DIAGNOSIS — R040 Epistaxis: Secondary | ICD-10-CM | POA: Diagnosis not present

## 2022-06-24 ENCOUNTER — Other Ambulatory Visit: Payer: Self-pay | Admitting: Family Medicine

## 2022-06-26 DIAGNOSIS — J9601 Acute respiratory failure with hypoxia: Secondary | ICD-10-CM | POA: Diagnosis not present

## 2022-06-26 DIAGNOSIS — N179 Acute kidney failure, unspecified: Secondary | ICD-10-CM | POA: Diagnosis not present

## 2022-06-26 DIAGNOSIS — E785 Hyperlipidemia, unspecified: Secondary | ICD-10-CM | POA: Diagnosis not present

## 2022-07-04 ENCOUNTER — Telehealth: Payer: Self-pay | Admitting: Family Medicine

## 2022-07-04 NOTE — Telephone Encounter (Signed)
Message sent to [place] at [employer] to see how we can get seat replaced.

## 2022-07-04 NOTE — Telephone Encounter (Signed)
Pt's seat is broken on her walker and would like CMA to call her back with advise.  Please call her back at [phone]

## 2022-07-05 ENCOUNTER — Telehealth: Payer: Self-pay | Admitting: Family Medicine

## 2022-07-05 NOTE — Telephone Encounter (Signed)
Spoke with patient to schedule AWV. Patient declined stating she does leave home.  And cannot do phone appt

## 2022-07-06 NOTE — Telephone Encounter (Signed)
I left patient a voicemail letting her know that I am still waiting to hear back from [employer] on how to get the walker seat replaced.

## 2022-07-11 NOTE — Telephone Encounter (Signed)
Update from [employer]: They are still looking into this.

## 2022-07-27 DIAGNOSIS — N179 Acute kidney failure, unspecified: Secondary | ICD-10-CM | POA: Diagnosis not present

## 2022-07-27 DIAGNOSIS — E785 Hyperlipidemia, unspecified: Secondary | ICD-10-CM | POA: Diagnosis not present

## 2022-07-27 DIAGNOSIS — J9601 Acute respiratory failure with hypoxia: Secondary | ICD-10-CM | POA: Diagnosis not present

## 2022-08-09 ENCOUNTER — Encounter: Payer: Self-pay | Admitting: Internal Medicine

## 2022-08-09 ENCOUNTER — Ambulatory Visit: Payer: Medicare HMO | Admitting: Internal Medicine

## 2022-08-09 VITALS — BP 140/88 | HR 95 | Ht 62.0 in | Wt 136.2 lb

## 2022-08-09 DIAGNOSIS — E559 Vitamin D deficiency, unspecified: Secondary | ICD-10-CM

## 2022-08-09 DIAGNOSIS — M899 Disorder of bone, unspecified: Secondary | ICD-10-CM | POA: Diagnosis not present

## 2022-08-09 DIAGNOSIS — E210 Primary hyperparathyroidism: Secondary | ICD-10-CM

## 2022-08-09 DIAGNOSIS — M8580 Other specified disorders of bone density and structure, unspecified site: Secondary | ICD-10-CM | POA: Diagnosis not present

## 2022-08-09 DIAGNOSIS — M949 Disorder of cartilage, unspecified: Secondary | ICD-10-CM | POA: Diagnosis not present

## 2022-08-09 LAB — VITAMIN D 25 HYDROXY (VIT D DEFICIENCY, FRACTURES): VITD: 58.42 ng/mL (ref 30.00–100.00)

## 2022-08-09 NOTE — Patient Instructions (Addendum)
Please continue: - Vitamin D 1000 units daily - Sensipar 30 mg daily   Please stop at the lab.  Please call and schedule bone density scan at the [facility]: [phone].   Please schedule another appointment in 1 year.  Hypercalcemia endocrine conditions hypercalcemia, Hypercalcemia is when the level of calcium in a person's blood is above normal. The body needs calcium to make bones and keep them strong. Calcium also helps the muscles, nerves, brain, and heart work the way they should. Most of the calcium in the body is stored in the bones. There is also calcium in the blood. Hypercalcemia occurs when there is too much calcium in your blood. Calcium levels in the blood are regulated by hormones, kidneys, and the gastrointestinal tract.  Hypercalcemia can happen when calcium comes out of the bones, or when the kidneys are not able to remove calcium from the blood. Hypercalcemia can be mild or severe. What are the causes? There are many possible causes of hypercalcemia. Common causes of this condition include: Hyperparathyroidism. This is a condition in which the body produces too much parathyroid hormone. There are four parathyroid glands in your neck. These glands produce a chemical messenger (hormone) that helps the body absorb calcium from foods and helps your bones release calcium. Certain kinds of cancer. Less common causes of hypercalcemia include: Calcium and vitamin D dietary supplements. Chronic kidney disease. Hyperthyroidism. Severe dehydration. Being on bed rest or being inactive for a long time. Certain medicines. Infections. What increases the risk? You are more likely to develop this condition if: You are female. You are 3 years of age or older. You have a family history of hypercalcemia. What are the signs or symptoms? Mild hypercalcemia that starts slowly may not cause symptoms. Severe, sudden hypercalcemia is more likely to cause symptoms, such as: Being  more thirsty than usual. Needing to urinate more often than usual. Abdominal pain. Nausea and vomiting. Constipation. Muscle pain, twitching, or weakness. Feeling very tired. How is this diagnosed?  Hypercalcemia is usually diagnosed with a blood test. You may also have tests to help check what is causing this condition. Tests include imaging tests and more blood tests. How is this treated? Treatment for hypercalcemia depends on the cause. Treatment may include: Receiving fluids through an IV. Medicines. These can be used to: Keep calcium levels steady after receiving fluids (loop diuretics). Keep calcium in your bones (bisphosphonates). Lower the calcium level in your blood. Surgery to remove overactive parathyroid glands. A procedure that filters your blood to correct calcium levels (hemodialysis). Follow these instructions at home:  Take over-the-counter and prescription medicines only as told by your health care provider. Follow instructions from your health care provider about eating or drinking restrictions. Drink enough fluid to keep your urine pale yellow. Stay active. Weight-bearing exercise helps to keep calcium in your bones. Follow instructions from your health care provider about what type and level of exercise is safe for you. Keep all follow-up visits. This is important. Contact a health care provider if: You have a fever. Your heartbeat is irregular or very fast. You have changes in mood, memory, or personality. Get help right away if: You have severe abdominal pain. You have chest pain. You have trouble breathing. You become very confused and sleepy. You lose consciousness. These symptoms may represent a serious problem that is an emergency. Do not wait to see if the symptoms will go away. Get medical help right away. Call your local emergency services (911  in the [place] Do not drive yourself to the hospital. Summary Hypercalcemia is when the level of calcium in  a person's blood is above normal. The body needs calcium to make bones and keep them strong. There are many possible causes of hypercalcemia, and treatment depends on the cause. Take over-the-counter and prescription medicines only as told by your health care provider. This information is not intended to replace advice given to you by your health care provider. Make sure you discuss any questions you have with your health care provider. Document Revised: 04/05/2021 Document Reviewed: 04/05/2021 Elsevier Patient Education  [place].

## 2022-08-09 NOTE — Progress Notes (Addendum)
Patient ID: DHARMA PARE, female   DOB: 1930-07-14, 88 y.o.   MRN: [id number]  HPI  CORVETTE ORSER is a 88 y.o.-year-old female, returning for follow-up for hypercalcemia/hyperparathyroidism.  She was previously seen by Dr. Loanne Drilling, last visit 6 months ago.  Pt was dx with hypercalcemia approximately in 2008.   She was deemed not to be a surgical candidate and is treated with Sensipar 30 mg daily  - started when she saw Dr. Loanne Drilling in 2021.  She felt much better, with more energy.  I reviewed pt's pertinent labs: Lab Results  Component Value Date   PTH 119 (H) 10/09/2021   PTH 109 (H) 06/06/2021   PTH 301 (H) 12/28/2020   PTH 186 (H) 07/01/2020   PTH 151 (H) 01/05/2020   PTH 127 (H) 01/12/2019   PTH 328 (H) 08/22/2018   PTH 319 (H) 05/20/2018   PTH 227.7 (H) 12/26/2009   CALCIUM 10.5 (H) 06/11/2022   CALCIUM 10.7 (H) 06/05/2022   CALCIUM 9.9 03/04/2022   CALCIUM 10.4 (H) 01/22/2022   CALCIUM 9.5 01/05/2022   CALCIUM 10.1 12/26/2021   CALCIUM 9.1 12/21/2021   CALCIUM 9.7 12/20/2021   CALCIUM 9.7 12/19/2021   CALCIUM 10.6 (H) 12/05/2021   She has a history of osteopenia. I reviewed pt's latest DXA scan report:   No recent fractures or falls.  She did have a distant right wrist fracture in 1988 - fell backwards on level ground, tripping on tires.  She occasionally gets dizzy when she stands up, but no vertigo or disequilibrium.  She walks with a walker.  No h/o kidney stones.  No h/o CKD. Last BUN/Cr: Lab Results  Component Value Date   BUN 19 06/11/2022   BUN 14 06/05/2022   CREATININE 0.79 06/11/2022   CREATININE 0.79 06/05/2022   Pt is not on HCTZ.  She has a h/o vitamin D deficiency. Reviewed vit D levels: Lab Results  Component Value Date   VD25OH 53.79 10/09/2021   VD25OH 57.31 06/06/2021   VD25OH 62.59 12/28/2020   VD25OH 37 07/01/2020   VD25OH 39.52 01/05/2020   VD25OH 38.92 07/15/2019   VD25OH 52.10 01/12/2019   VD25OH 19 (L) 08/22/2018   VD25OH  12.81 (L) 05/20/2018   Pt is in 1000 units vitamin D daily.  Calcitriol level was normal: Component     Latest Ref Rng 01/05/2020  Vitamin D 1, 25 (OH) Total     18 - 72 pg/mL 58   Vitamin D3 1, 25 (OH)     pg/mL 58   Vitamin D2 1, 25 (OH)     pg/mL <8    UPEP was normal: Component     Latest Ref Rng 12/21/2019  Protein Urine Random     Not Estab. mg/dL 30.0   Albumin ELP, Urine     % 56.0   Alpha-1-Globulin, U     % 1.7   ALPHA-2-GLOBULIN, U     % 12.3   Beta Globulin, U     % 18.0   Gamma Globulin, U     % 12.0   M Component, Ur     Not Observed % Not Observed   Please Note: Comment    Pt does not have a FH of hypercalcemia, pituitary tumors, thyroid cancer, or osteoporosis.   Pt. also has a history of HTN, GERD, glaucoma - on drops, h/o PE - on Eliquis.   ROS: + see HPI  Past Medical History:  Diagnosis Date   DVT (  deep venous thrombosis) ([place])    GERD 10/03/2007   HYPERTENSION 10/03/2007   OSTEOPENIA 02/03/2008   Primary hyperparathyroidism ([place]) 10/03/2007   Past Surgical History:  Procedure Laterality Date   ABDOMINAL HYSTERECTOMY     Social History   Socioeconomic History   Marital status: Widowed    Spouse name: Not on file   Number of children: Not on file   Years of education: Not on file   Highest education level: Not on file  Occupational History   Occupation: retired  Tobacco Use   Smoking status: Former    Types: Cigarettes    Quit date: 11/12/1978    Years since quitting: 43.7   Smokeless tobacco: Never  Vaping Use   Vaping Use: Never used  Substance and Sexual Activity   Alcohol use: No   Drug use: No   Sexual activity: Yes  Other Topics Concern   Not on file  Social History Narrative   Pt states her son and daughter in law live with her   Social Determinants of Health   Financial Resource Strain: [place]  (07/12/2021)   Overall Financial Resource Strain (CARDIA)    Difficulty of Paying Living Expenses: Not hard at all   Food Insecurity: No [place] (07/12/2021)   Hunger Vital Sign    Worried About Running Out of Food in the Last Year: Never true    [place] in the Last Year: Never true  Transportation Needs: No Transportation Needs (07/12/2021)   PRAPARE - Hydrologist (Medical): No    Lack of Transportation (Non-Medical): No  Physical Activity: Inactive (07/12/2021)   Exercise Vital Sign    Days of Exercise per Week: 0 days    Minutes of Exercise per Session: 0 min  Stress: No Stress Concern Present (07/12/2021)   [place]    Feeling of Stress : Not at all  Social Connections: Moderately Isolated (07/12/2021)   Social Connection and Isolation Panel [NHANES]    Frequency of Communication with Friends and Family: More than three times a week    Frequency of Social Gatherings with Friends and Family: Never    Attends Religious Services: More than 4 times per year    Active Member of [employer] or Organizations: No    Attends Archivist Meetings: Never    Marital Status: Widowed  Intimate Partner Violence: Not At Risk (07/12/2021)   Humiliation, Afraid, Rape, and Kick questionnaire    Fear of Current or Ex-Partner: No    Emotionally Abused: No    Physically Abused: No    Sexually Abused: No   Current Outpatient Medications on File Prior to Visit  Medication Sig Dispense Refill   acetaminophen (TYLENOL) 500 MG tablet Take 500 mg by mouth 3 (three) times daily.     apixaban (ELIQUIS) 5 MG TABS tablet Take 1 tablet (5 mg total) by mouth 2 (two) times daily. 60 tablet 0   cephALEXin (KEFLEX) 500 MG capsule Take 1 capsule (500 mg total) by mouth 3 (three) times daily. 21 capsule 0   cinacalcet (SENSIPAR) 30 MG tablet TAKE 1 TABLET EVERY DAY (Patient taking differently: Take 30 mg by mouth.) 90 tablet 3   furosemide (LASIX) 40 MG tablet Take 0.5 tablets (20 mg total) by mouth daily. 90 tablet 3    latanoprost (XALATAN) 0.005 % ophthalmic solution INSTILL 1 DROP INTO BOTH EYES AT BEDTIME (Patient taking differently: Place  1 drop into both eyes at bedtime.) 7.5 mL 3   Magnesium Oxide 400 MG CAPS Take 1 capsule (400 mg total) by mouth daily. 90 capsule 3   omeprazole (PRILOSEC) 40 MG capsule Take 1 capsule (40 mg total) by mouth daily. 90 capsule 3   OXYGEN Inhale into the lungs.     pravastatin (PRAVACHOL) 20 MG tablet Take 1 tablet (20 mg total) by mouth daily. SCHEDULE APPT FOR FUTURE REFILLS 30 tablet 0   Tiotropium Bromide Monohydrate (SPIRIVA RESPIMAT) 2.5 MCG/ACT AERS Inhale 2 puffs into the lungs daily. 4 g 5   vitamin B-12 (CYANOCOBALAMIN) 1000 MCG tablet Take 1,000 mcg by mouth 2 (two) times a week. Tuesday and thursday     VITAMIN D PO Take 1,000 Units by mouth at bedtime.     No current facility-administered medications on file prior to visit.   Allergies  Allergen Reactions   Codeine Nausea And Vomiting   Family History  Problem Relation Age of Onset   Hypercalcemia Neg Hx    PE: BP (!) 140/88 (BP Location: Right Arm, Patient Position: Sitting, Cuff Size: Normal)   Pulse 95   Ht '5\' 2"'$  (1.575 m)   Wt 136 lb 3.2 oz (61.8 kg)   SpO2 98%   BMI 24.91 kg/m  Wt Readings from Last 3 Encounters:  08/09/22 136 lb 3.2 oz (61.8 kg)  06/11/22 141 lb 1.5 oz (64 kg)  06/05/22 140 lb (63.5 kg)   Constitutional: normal weight, in NAD, walks with a walker, on oxygen Eyes:  EOMI, no exophthalmos ENT: no neck masses, no cervical lymphadenopathy Cardiovascular: Tachycardia, RR, No MRG Respiratory: CTA B Musculoskeletal: no deformities Skin:no rashes Neurological: no tremor with outstretched hands  Assessment: 1. Hypercalcemia/hyperparathyroidism  2.  History of vitamin D deficiency  3.  Osteopenia  Plan: And 3. Patient has had several instances of elevated calcium, with the highest level being at 10.7. Intact PTH levels were also high, with the highest level being in  the 300s. - Patient also  has vitamin D deficiency,  with the last level being normal, though.  - No apparent complications from hypercalcemia: no h/o nephrolithiasis, no osteoporosis, but does have osteopenia per the latest bone density scan from 2011, no recent fractures. No abdominal pain, depression, bone pain. - she most likely has primary hyperparathyroidism.  Calcium has fluctuating between normal range and slightly above while PTH levels are high, as expected in the setting of treatment with Sensipar. - due to age, I agree that patient is not a good surgical candidate and if Sensipar is covered by her insurance, this is a good treatment choice; the only concern is about decreasing bone density due to the high PTH even without significant hypercalcemia - At today's visit, we discussed about checking another bone density scan since the last was in 2011.  At that time, the bone density scan at the hips was low.  She was initially reticent to this suggestion as she thought that the bone density is checked by bone biopsy.  However, also I clarified with her that this is just an x-ray, she agrees to have this done.  I will also the 33% distal radius to the DXA analysis, since this is the site most frequently affected by hyperparathyroidism - We discussed that if the bone density is low, we will need to treat this.  First option would be a bisphosphonate or Prolia.  She agrees to start a p.o. medication if absolutely needed. - RTC  in 1 year  2.  History of vitamin D deficiency -She continues on 1000 units vitamin D daily -Latest level was normal in 09/2021 -At today's visit, I will check another vitamin D level. I will advise her about vitamin D supplement dose when the results of the vitamin D level are back.  Orders Placed This Encounter  Procedures   DG Bone Density   Vitamin D, 25-hydroxy   - Total time spent for the visit: 40 min, in precharting, reviewing Dr. Cordelia Pen last note, obtaining  medical information from the chart and from the pt., reviewing her  previous labs, evaluations, and treatments, reviewing her symptoms, counseling her about her endocrine conditions (please see the discussed topics above), and developing a plan to further investigate and treat them; she had a number of questions which I addressed.   Component     Latest Ref Rng 08/09/2022  VITD     30.00 - 100.00 ng/mL 58.42   Vitamin D level is normal.  08/23/2022 ([place]) Lumbar spine L1-L4(L2) Femoral neck (FN) 33% distal radius  T-score   -1.2 RFN: -3.7 LFN: -3.2 -4.6  Change in BMD from previous DXA test (%) Down 15.1%* Down 25.4%* n/a  (*) statistically significant  Bone density is very low. I would suggest Evenity (if not possible, then Prolia).  I will check with the patient.  Philemon Kingdom, MD PhD [facility]

## 2022-08-21 ENCOUNTER — Ambulatory Visit (INDEPENDENT_AMBULATORY_CARE_PROVIDER_SITE_OTHER): Payer: Medicare HMO

## 2022-08-21 VITALS — BP 103/64 | HR 81 | Ht 62.5 in | Wt 139.0 lb

## 2022-08-21 DIAGNOSIS — Z0000 Encounter for general adult medical examination without abnormal findings: Secondary | ICD-10-CM

## 2022-08-21 NOTE — Patient Instructions (Signed)
Annette [name] , Thank you for taking time to come for your Medicare Wellness Visit. I appreciate your ongoing commitment to your health goals. Please review the following plan we discussed and let me know if I can assist you in the future.   Screening recommendations/referrals: Colonoscopy: not required Mammogram: not required Bone Density: scheduled for 08/23/2022 Recommended yearly ophthalmology/optometry visit for glaucoma screening and checkup Recommended yearly dental visit for hygiene and checkup  Vaccinations: Influenza vaccine: due Pneumococcal vaccine: completed 08/10/2014 Tdap vaccine: completed 08/31/2018, due 08/31/2028 Shingles vaccine: discussed   Covid-19: 02/22/2021, 02/03/2020, 01/10/2020  Advanced directives: copy in chart  Conditions/risks identified: none  Next appointment: Follow up in one year for your annual wellness visit    Preventive Care 86 Years and Older, Female Preventive care refers to lifestyle choices and visits with your health care provider that can promote health and wellness. What does preventive care include? A yearly physical exam. This is also called an annual well check. Dental exams once or twice a year. Routine eye exams. Ask your health care provider how often you should have your eyes checked. Personal lifestyle choices, including: Daily care of your teeth and gums. Regular physical activity. Eating a healthy diet. Avoiding tobacco and drug use. Limiting alcohol use. Practicing safe sex. Taking low-dose aspirin every day. Taking vitamin and mineral supplements as recommended by your health care provider. What happens during an annual well check? The services and screenings done by your health care provider during your annual well check will depend on your age, overall health, lifestyle risk factors, and family history of disease. Counseling  Your health care provider may ask you questions about your: Alcohol use. Tobacco use. Drug  use. Emotional well-being. Home and relationship well-being. Sexual activity. Eating habits. History of falls. Memory and ability to understand (cognition). Work and work Statistician. Reproductive health. Screening  You may have the following tests or measurements: Height, weight, and BMI. Blood pressure. Lipid and cholesterol levels. These may be checked every 5 years, or more frequently if you are over 42 years old. Skin check. Lung cancer screening. You may have this screening every year starting at age 66 if you have a 30-pack-year history of smoking and currently smoke or have quit within the past 15 years. Fecal occult blood test (FOBT) of the stool. You may have this test every year starting at age 61. Flexible sigmoidoscopy or colonoscopy. You may have a sigmoidoscopy every 5 years or a colonoscopy every 10 years starting at age 75. Hepatitis C blood test. Hepatitis B blood test. Sexually transmitted disease (STD) testing. Diabetes screening. This is done by checking your blood sugar (glucose) after you have not eaten for a while (fasting). You may have this done every 1-3 years. Bone density scan. This is done to screen for osteoporosis. You may have this done starting at age 20. Mammogram. This may be done every 1-2 years. Talk to your health care provider about how often you should have regular mammograms. Talk with your health care provider about your test results, treatment options, and if necessary, the need for more tests. Vaccines  Your health care provider may recommend certain vaccines, such as: Influenza vaccine. This is recommended every year. Tetanus, diphtheria, and acellular pertussis (Tdap, Td) vaccine. You may need a Td booster every 10 years. Zoster vaccine. You may need this after age 15. Pneumococcal 13-valent conjugate (PCV13) vaccine. One dose is recommended after age 64. Pneumococcal polysaccharide (PPSV23) vaccine. One dose is recommended after age  65. Talk to your health care provider about which screenings and vaccines you need and how often you need them. This information is not intended to replace advice given to you by your health care provider. Make sure you discuss any questions you have with your health care provider. Document Released: 11/25/2015 Document Revised: 07/18/2016 Document Reviewed: 08/30/2015 Elsevier Interactive Patient Education  2017 [place] Prevention in the Home Falls can cause injuries. They can happen to people of all ages. There are many things you can do to make your home safe and to help prevent falls. What can I do on the outside of my home? Regularly fix the edges of walkways and driveways and fix any cracks. Remove anything that might make you trip as you walk through a door, such as a raised step or threshold. Trim any bushes or trees on the path to your home. Use bright outdoor lighting. Clear any walking paths of anything that might make someone trip, such as rocks or tools. Regularly check to see if handrails are loose or broken. Make sure that both sides of any steps have handrails. Any raised decks and porches should have guardrails on the edges. Have any leaves, snow, or ice cleared regularly. Use sand or salt on walking paths during winter. Clean up any spills in your garage right away. This includes oil or grease spills. What can I do in the bathroom? Use night lights. Install grab bars by the toilet and in the tub and shower. Do not use towel bars as grab bars. Use non-skid mats or decals in the tub or shower. If you need to sit down in the shower, use a plastic, non-slip stool. Keep the floor dry. Clean up any water that spills on the floor as soon as it happens. Remove soap buildup in the tub or shower regularly. Attach bath mats securely with double-sided non-slip rug tape. Do not have throw rugs and other things on the floor that can make you trip. What can I do in the  bedroom? Use night lights. Make sure that you have a light by your bed that is easy to reach. Do not use any sheets or blankets that are too big for your bed. They should not hang down onto the floor. Have a firm chair that has side arms. You can use this for support while you get dressed. Do not have throw rugs and other things on the floor that can make you trip. What can I do in the kitchen? Clean up any spills right away. Avoid walking on wet floors. Keep items that you use a lot in easy-to-reach places. If you need to reach something above you, use a strong step stool that has a grab bar. Keep electrical cords out of the way. Do not use floor polish or wax that makes floors slippery. If you must use wax, use non-skid floor wax. Do not have throw rugs and other things on the floor that can make you trip. What can I do with my stairs? Do not leave any items on the stairs. Make sure that there are handrails on both sides of the stairs and use them. Fix handrails that are broken or loose. Make sure that handrails are as long as the stairways. Check any carpeting to make sure that it is firmly attached to the stairs. Fix any carpet that is loose or worn. Avoid having throw rugs at the top or bottom of the stairs. If you do have throw  rugs, attach them to the floor with carpet tape. Make sure that you have a light switch at the top of the stairs and the bottom of the stairs. If you do not have them, ask someone to add them for you. What else can I do to help prevent falls? Wear shoes that: Do not have high heels. Have rubber bottoms. Are comfortable and fit you well. Are closed at the toe. Do not wear sandals. If you use a stepladder: Make sure that it is fully opened. Do not climb a closed stepladder. Make sure that both sides of the stepladder are locked into place. Ask someone to hold it for you, if possible. Clearly mark and make sure that you can see: Any grab bars or  handrails. First and last steps. Where the edge of each step is. Use tools that help you move around (mobility aids) if they are needed. These include: Canes. Walkers. Scooters. Crutches. Turn on the lights when you go into a dark area. Replace any light bulbs as soon as they burn out. Set up your furniture so you have a clear path. Avoid moving your furniture around. If any of your floors are uneven, fix them. If there are any pets around you, be aware of where they are. Review your medicines with your doctor. Some medicines can make you feel dizzy. This can increase your chance of falling. Ask your doctor what other things that you can do to help prevent falls. This information is not intended to replace advice given to you by your health care provider. Make sure you discuss any questions you have with your health care provider. Document Released: 08/25/2009 Document Revised: 04/05/2016 Document Reviewed: 12/03/2014 Elsevier Interactive Patient Education  2017 [employer].

## 2022-08-21 NOTE — Progress Notes (Signed)
I connected with Annette [name] today by telephone and verified that I am speaking with the correct person using two identifiers. Location patient: home Location provider: work Persons participating in the virtual visit: Annette [name], Glenna Durand LPN.   I discussed the limitations, risks, security and privacy concerns of performing an evaluation and management service by telephone and the availability of in person appointments. I also discussed with the patient that there may be a patient responsible charge related to this service. The patient expressed understanding and verbally consented to this telephonic visit.    Interactive audio and video telecommunications were attempted between this provider and patient, however failed, due to patient having technical difficulties OR patient did not have access to video capability.  We continued and completed visit with audio only.     Vital signs may be patient reported or missing.  Subjective:   Annette [name] is a 88 y.o. female who presents for Medicare Annual (Subsequent) preventive examination.  Review of Systems     Cardiac Risk Factors include: advanced age (>14mn, >>52women);dyslipidemia;hypertension     Objective:    Today's Vitals   08/21/22 1426  BP: 103/64  Pulse: 81  SpO2: 97%  Weight: 139 lb (63 kg)  Height: 5' 2.5" (1.588 m)   Body mass index is 25.02 kg/m.     08/21/2022    2:38 PM 06/11/2022   12:23 PM 03/04/2022    2:02 PM 07/12/2021    2:54 PM 08/09/2020    4:07 PM 11/13/2019    2:18 AM 11/12/2019    4:02 PM  Advanced Directives  Does Patient Have a Medical Advance Directive? Yes No No Yes Yes No No  Type of AParamedicof [place]ry [place] will   Healthcare Power of [place] will    Copy of [place] Chart? Yes - validated most recent copy scanned in chart (See row information)   No - copy requested No - copy requested    Would  patient like information on creating a medical advance directive?  No - Patient declined    No - Patient declined No - Patient declined    Current Medications (verified) Outpatient Encounter Medications as of 08/21/2022  Medication Sig   acetaminophen (TYLENOL) 500 MG tablet Take 500 mg by mouth 3 (three) times daily.   apixaban (ELIQUIS) 5 MG TABS tablet Take 1 tablet (5 mg total) by mouth 2 (two) times daily.   cinacalcet (SENSIPAR) 30 MG tablet TAKE 1 TABLET EVERY DAY (Patient taking differently: Take 30 mg by mouth.)   furosemide (LASIX) 40 MG tablet Take 0.5 tablets (20 mg total) by mouth daily.   latanoprost (XALATAN) 0.005 % ophthalmic solution INSTILL 1 DROP INTO BOTH EYES AT BEDTIME (Patient taking differently: Place 1 drop into both eyes at bedtime.)   Magnesium Oxide 400 MG CAPS Take 1 capsule (400 mg total) by mouth daily.   omeprazole (PRILOSEC) 40 MG capsule Take 1 capsule (40 mg total) by mouth daily.   OXYGEN Inhale into the lungs.   pravastatin (PRAVACHOL) 20 MG tablet Take 1 tablet (20 mg total) by mouth daily. SCHEDULE APPT FOR FUTURE REFILLS   vitamin B-12 (CYANOCOBALAMIN) 1000 MCG tablet Take 1,000 mcg by mouth 2 (two) times a week. Tuesday and thursday   VITAMIN D PO Take 1,000 Units by mouth at bedtime.   cephALEXin (KEFLEX) 500 MG capsule Take 1 capsule (500 mg total) by mouth 3 (three) times daily. (  Patient not taking: Reported on 08/21/2022)   Tiotropium Bromide Monohydrate (SPIRIVA RESPIMAT) 2.5 MCG/ACT AERS Inhale 2 puffs into the lungs daily. (Patient not taking: Reported on 08/09/2022)   No facility-administered encounter medications on file as of 08/21/2022.    Allergies (verified) Codeine   History: Past Medical History:  Diagnosis Date   DVT (deep venous thrombosis) ([place])    GERD 10/03/2007   HYPERTENSION 10/03/2007   OSTEOPENIA 02/03/2008   Primary hyperparathyroidism ([place]) 10/03/2007   Past Surgical History:  Procedure Laterality Date   ABDOMINAL  HYSTERECTOMY     Family History  Problem Relation Age of Onset   Hypercalcemia Neg Hx    Social History   Socioeconomic History   Marital status: Widowed    Spouse name: Not on file   Number of children: Not on file   Years of education: Not on file   Highest education level: Not on file  Occupational History   Occupation: retired  Tobacco Use   Smoking status: Former    Types: Cigarettes    Quit date: 11/12/1978    Years since quitting: 43.8   Smokeless tobacco: Never  Vaping Use   Vaping Use: Never used  Substance and Sexual Activity   Alcohol use: No   Drug use: No   Sexual activity: Not Currently  Other Topics Concern   Not on file  Social History Narrative   Pt states her son and daughter in law live with her   Social Determinants of Health   Financial Resource Strain: [place]  (08/21/2022)   Overall Financial Resource Strain (CARDIA)    Difficulty of Paying Living Expenses: Not hard at all  Food Insecurity: No [place] (08/21/2022)   Hunger Vital Sign    Worried About Running Out of Food in the Last Year: Never true    [place] in the Last Year: Never true  Transportation Needs: No Transportation Needs (08/21/2022)   PRAPARE - Hydrologist (Medical): No    Lack of Transportation (Non-Medical): No  Physical Activity: Inactive (08/21/2022)   Exercise Vital Sign    Days of Exercise per Week: 0 days    Minutes of Exercise per Session: 0 min  Stress: No Stress Concern Present (08/21/2022)   [place]    Feeling of Stress : Not at all  Social Connections: Moderately Isolated (07/12/2021)   Social Connection and Isolation Panel [NHANES]    Frequency of Communication with Friends and Family: More than three times a week    Frequency of Social Gatherings with Friends and Family: Never    Attends Religious Services: More than 4 times per year    Active  Member of [employer] or Organizations: No    Attends Archivist Meetings: Never    Marital Status: Widowed    Tobacco Counseling Counseling given: Not Answered   Clinical Intake:  Pre-visit preparation completed: Yes  Pain : No/denies pain     Nutritional Status: BMI 25 -29 Overweight Nutritional Risks: None Diabetes: No  How often do you need to have someone help you when you read instructions, pamphlets, or other written materials from your doctor or pharmacy?: 1 - Never  Diabetic? no  Interpreter Needed?: No  Information entered by :: NAllen LPN   Activities of Daily Living    08/21/2022    2:41 PM  In your present state of health, do you have any difficulty performing  the following activities:  Hearing? 0  Vision? 0  Difficulty concentrating or making decisions? 1  Comment short term  Walking or climbing stairs? 1  Dressing or bathing? 0  Doing errands, shopping? 1  Preparing Food and eating ? N  Using the Toilet? N  In the past six months, have you accidently leaked urine? Y  Comment on lasix  Do you have problems with loss of bowel control? N  Managing your Medications? N  Managing your Finances? N  Housekeeping or managing your Housekeeping? Y    Patient Care Team: [place], [name], MD as PCP - General (Family Medicine) [name], MD as PCP - Cardiology (Cardiology) Viona Gilmore, [facility] as Pharmacist (Pharmacist)  Indicate any recent Medical Services you may have received from other than [facility] providers in the past year (date may be approximate).     Assessment:   This is a routine wellness examination for [place].  Hearing/Vision screen Vision Screening - Comments:: Regular eye exams, [place]  Dietary issues and exercise activities discussed: Current Exercise Habits: The patient does not participate in regular exercise at present   Goals Addressed             This Visit's Progress    Patient Stated        08/21/2022, keep living       Depression Screen    08/21/2022    2:40 PM 06/05/2022    9:23 AM 12/26/2021    9:25 AM 12/05/2021    9:54 PM 07/12/2021    2:53 PM 08/31/2020    1:10 PM 08/09/2020    4:04 PM  PHQ 2/9 Scores  PHQ - 2 Score 0 0 1 0 0 0 0    Fall Risk    08/21/2022    2:39 PM 06/05/2022    9:23 AM 12/26/2021    1:15 PM 07/12/2021    3:00 PM 08/31/2020    1:11 PM  [place] in the past year? 0 0 0 0 1  Number falls in past yr: 0 0 0 0 0  Injury with Fall? 0 0 0 0 0  Risk for fall due to : Impaired balance/gait;Impaired mobility;Medication side effect History of fall(s) Impaired balance/gait;History of fall(s) Impaired vision;Other (Comment) Impaired balance/gait;Orthopedic patient  Risk for fall due to: Comment    use of a walker   Follow up Falls prevention discussed;Falls evaluation completed;Education provided Falls evaluation completed Education provided Falls prevention discussed Education provided    FALL RISK PREVENTION PERTAINING TO THE HOME:  Any stairs in or around the home? No  If so, are there any without handrails? N/a Home free of loose throw rugs in walkways, pet beds, electrical cords, etc? Yes  Adequate lighting in your home to reduce risk of falls? Yes   ASSISTIVE DEVICES UTILIZED TO PREVENT FALLS:  Life alert? No  Use of a cane, walker or w/c? Yes  Grab bars in the bathroom? Yes  Shower chair or bench in shower? Yes  Elevated toilet seat or a handicapped toilet? Yes   TIMED UP AND GO:  Was the test performed? No .      Cognitive Function:      07/03/2020    4:26 PM  Montreal Cognitive Assessment   Visuospatial/ Executive (0/5) 5  Naming (0/3) 3  Attention: Read list of digits (0/2) 2  Attention: Read list of letters (0/1) 1  Attention: Serial 7 subtraction starting at 100 (0/3) 2  Language: Repeat phrase (0/2) 2  Language : Fluency (0/1) 1  Abstraction (0/2) 2  Delayed Recall (0/5) 5  Orientation (0/6) 6  Total 29       08/21/2022    2:45 PM 07/12/2021    3:02 PM  6CIT Screen  What Year? 0 points 0 points  What month? 0 points 0 points  What time? 0 points 0 points  Count back from 20 0 points 0 points  Months in reverse 0 points 0 points  Repeat phrase 0 points 0 points  Total Score 0 points 0 points    Immunizations Immunization History  Administered Date(s) Administered   Fluad Quad(high Dose 65+) 07/15/2019, 08/31/2020   Influenza Split 08/22/2011   Influenza Whole 08/10/2010   Influenza, High Dose Seasonal PF 08/04/2014, 08/05/2016, 08/04/2017, 07/24/2018   Influenza,inj,Quad PF,6+ Mos 08/10/2013   Influenza-Unspecified 07/27/2017, 09/21/2021   PFIZER(Purple Top)SARS-COV-2 Vaccination 01/10/2020, 02/03/2020, 02/22/2021   Pneumococcal Conjugate-13 08/10/2014   Pneumococcal Polysaccharide-23 08/10/2013   Tdap 08/31/2020    TDAP status: Up to date  Flu Vaccine status: Due, Education has been provided regarding the importance of this vaccine. Advised may receive this vaccine at local pharmacy or Health Dept. Aware to provide a copy of the vaccination record if obtained from local pharmacy or Health Dept. Verbalized acceptance and understanding.  Pneumococcal vaccine status: Up to date  Covid-19 vaccine status: Completed vaccines  Qualifies for Shingles Vaccine? Yes   Zostavax completed No   Shingrix Completed?: No.    Education has been provided regarding the importance of this vaccine. Patient has been advised to call insurance company to determine out of pocket expense if they have not yet received this vaccine. Advised may also receive vaccine at local pharmacy or Health Dept. Verbalized acceptance and understanding.  Screening Tests Health Maintenance  Topic Date Due   INFLUENZA VACCINE  06/12/2022   COVID-19 Vaccine (4 - Pfizer series) 11/16/2022 (Originally 04/19/2021)   Zoster Vaccines- Shingrix (1 of 2) 11/16/2022 (Originally [birth year]24/1982)   Pneumonia Vaccine 6+ Years old   Completed   HPV VACCINES  Aged Out   DEXA SCAN  Discontinued   TETANUS/TDAP  Discontinued    Health Maintenance  Health Maintenance Due  Topic Date Due   INFLUENZA VACCINE  06/12/2022    Colorectal cancer screening: No longer required.   Mammogram status: No longer required due to age.  Bone Density status: scheduled for 08/23/2022  Lung Cancer Screening: (Low Dose CT Chest recommended if Age 37-80 years, 30 pack-year currently smoking OR have quit w/in 15years.) does not qualify.   Lung Cancer Screening Referral: no  Additional Screening:  Hepatitis C Screening: does not qualify;   Vision Screening: Recommended annual ophthalmology exams for early detection of glaucoma and other disorders of the eye. Is the patient up to date with their annual eye exam?  Yes  Who is the provider or what is the name of the office in which the patient attends annual eye exams? [facility] If pt is not established with a provider, would they like to be referred to a provider to establish care? No .   Dental Screening: Recommended annual dental exams for proper oral hygiene  Community Resource Referral / Chronic Care Management: CRR required this visit?  No   CCM required this visit?  No      Plan:     I have personally reviewed and noted the following in the patient's chart:   Medical and social history Use of alcohol,  tobacco or illicit drugs  Current medications and supplements including opioid prescriptions. Patient is not currently taking opioid prescriptions. Functional ability and status Nutritional status Physical activity Advanced directives List of other physicians Hospitalizations, surgeries, and ER visits in previous 12 months Vitals Screenings to include cognitive, depression, and falls Referrals and appointments  In addition, I have reviewed and discussed with patient certain preventive protocols, quality metrics, and best practice recommendations. A written  personalized care plan for preventive services as well as general preventive health recommendations were provided to patient.     Kellie Simmering, LPN   [id number]   Nurse Notes: none  Due to this being a virtual visit, the after visit summary with patients personalized plan was offered to patient via mail or my-chart. Patient would like to access on my-chart

## 2022-08-23 ENCOUNTER — Ambulatory Visit (INDEPENDENT_AMBULATORY_CARE_PROVIDER_SITE_OTHER)
Admission: RE | Admit: 2022-08-23 | Discharge: 2022-08-23 | Disposition: A | Discharge status: Home / Self Care | Payer: Medicare HMO | Source: Ambulatory Visit | Attending: Internal Medicine | Admitting: Internal Medicine

## 2022-08-23 DIAGNOSIS — M899 Disorder of bone, unspecified: Secondary | ICD-10-CM

## 2022-08-23 DIAGNOSIS — M8580 Other specified disorders of bone density and structure, unspecified site: Secondary | ICD-10-CM

## 2022-08-23 DIAGNOSIS — E210 Primary hyperparathyroidism: Secondary | ICD-10-CM | POA: Diagnosis not present

## 2022-08-23 DIAGNOSIS — M949 Disorder of cartilage, unspecified: Secondary | ICD-10-CM

## 2022-08-27 ENCOUNTER — Telehealth: Payer: Self-pay | Admitting: Internal Medicine

## 2022-08-27 NOTE — Telephone Encounter (Signed)
Patient is calling to say that at her last appointment she ran out of oxygen while waiting to get her labs done and she just wants the office to know that in the future she is on oxygen and she does not want to run out of oxygen while here again.  Patient stated that she drove 20 minutes home without oxygen.  Patient also states that she spoke with Dr, Cruzita Lederer about her labs and she does want  to start the Evenity Injection.

## 2022-08-28 ENCOUNTER — Other Ambulatory Visit: Payer: Self-pay | Admitting: Family Medicine

## 2022-08-31 ENCOUNTER — Telehealth: Payer: Self-pay | Admitting: Family Medicine

## 2022-08-31 NOTE — Telephone Encounter (Signed)
Chart updated

## 2022-08-31 NOTE — Telephone Encounter (Signed)
Pt is calling to let md know she received her flu shot yesterday 08-30-2022 at [facility] on [address]

## 2022-09-24 ENCOUNTER — Telehealth: Payer: Self-pay

## 2022-09-24 NOTE — Telephone Encounter (Signed)
Pt lvm advising she is unable to afford Evenity at this time. She is currently struggling to afford all of her medication and at this time she can barely afford groceries.

## 2022-09-25 NOTE — Telephone Encounter (Signed)
T, Was this is very expensive? Are we still waiting for insurance approval?  C

## 2022-09-25 NOTE — Telephone Encounter (Signed)
Pt was contacted and advised we would still proceed with the request to see what her out of pocket cost will be. Once we have a determination we will follow up.

## 2022-10-01 ENCOUNTER — Telehealth: Payer: Self-pay

## 2022-10-01 NOTE — Telephone Encounter (Signed)
[place], Tileshia, RMA  Jasper Loser, CMA Can we get a Prior Auth for [employer] Thank you.

## 2022-10-05 NOTE — Telephone Encounter (Signed)
Evenity VOB initiated via [email or web address]  New start Hx of fracture  

## 2022-10-13 NOTE — Telephone Encounter (Signed)
Prior auth required for EVENITY  PA PROCESS DETAILS: PA is required. PA can be initiated by calling [phone] or online at [email or web address]

## 2022-10-14 ENCOUNTER — Other Ambulatory Visit: Payer: Self-pay | Admitting: Family Medicine

## 2022-10-18 NOTE — Telephone Encounter (Signed)
Prior Authorization initiated for EVENITY via [email or web address] KEYMarcelyn Ditty - PA Case ID: [id number]

## 2022-10-21 NOTE — Progress Notes (Unsigned)
Cardiology Office Note:    Date:  10/22/2022   ID:  Annette [name], DOB 02/28/1930, MRN [id number]  PCP:  Martinique, [name], MD  Cardiologist:  [name], MD  Electrophysiologist:  None   Referring MD: Martinique, [name], MD   Chief Complaint  Patient presents with   Follow-up   Atrial Fibrillation    History of Present Illness:    Annette [name] is a 88 y.o. female with a hx of hypertension, PE, primary hyperparathyroidism who presents for follow-up.  She was referred by Dr. Martinique for evaluation of atrial fibrillation, initially seen on 03/24/2021.  From review of prior EKGs, was in sinus rhythm on EKG 08/18/2018 but has been A. fib since EKG on 11/12/2019.  She was admitted from 11/12/2019- 11/16/2019 with submassive PE.  She was started on heparin drip and transition to Eliquis.  Echocardiogram on 11/2019 showed normal biventricular function, no significant valvular disease.  She took Eliquis x 6 months then stopped.  Zio patch was prescribed for 3 days on 04/05/2021 but only wore for 9 hours, showed 100% A. fib burden with average rate 71 bpm.  Echocardiogram on 04/20/2021 showed normal biventricular function RVSP 35, mild to moderate TR, mild MR, PFO.  Zio patch x3 days 06/12/2021 showed 100% A-fib burden with average rate 75 bpm.  She was admitted 2/7 through 12/21/2021 with acute hypoxic respiratory failure.  SPO2 84% on presentation and in A-fib with RVR to 110s.  CT chest showed small chronic PE and right lower lobe, new right pleural effusion, emphysematous changes throughout the lungs.  She was diuresed with IV Lasix for suspected acute on chronic diastolic heart failure.  She was resumed on home Lasix 40 mg 3 times weekly on discharge.  Since last clinic visit, she reports that she is doing well.  Denies any chest pain, dyspnea, lightheadedness, syncope, lower extremity edema, or palpitations.   Weighs herself daily, reports weight has been stable at home.  Checks BP daily and  has been in SBP 100s to 110s.  Denies any bleeding issues on Eliquis.   Wt Readings from Last 3 Encounters:  10/22/22 136 lb 12.8 oz (62.1 kg)  08/21/22 139 lb (63 kg)  08/09/22 136 lb 3.2 oz (61.8 kg)      Past Medical History:  Diagnosis Date   DVT (deep venous thrombosis) ([place])    GERD 10/03/2007   HYPERTENSION 10/03/2007   OSTEOPENIA 02/03/2008   Primary hyperparathyroidism ([place]) 10/03/2007    Past Surgical History:  Procedure Laterality Date   ABDOMINAL HYSTERECTOMY      Current Medications: Current Meds  Medication Sig   acetaminophen (TYLENOL) 500 MG tablet Take 500 mg by mouth 3 (three) times daily.   apixaban (ELIQUIS) 5 MG TABS tablet Take 1 tablet (5 mg total) by mouth 2 (two) times daily.   furosemide (LASIX) 40 MG tablet Take 0.5 tablets (20 mg total) by mouth daily.   latanoprost (XALATAN) 0.005 % ophthalmic solution INSTILL 1 DROP INTO BOTH EYES AT BEDTIME (Patient taking differently: Place 1 drop into both eyes at bedtime.)   Magnesium Oxide 400 MG CAPS Take 1 capsule (400 mg total) by mouth daily.   omeprazole (PRILOSEC) 40 MG capsule TAKE 1 CAPSULE EVERY DAY   OXYGEN Inhale into the lungs.   pravastatin (PRAVACHOL) 20 MG tablet Take 1 tablet (20 mg total) by mouth daily.   vitamin B-12 (CYANOCOBALAMIN) 1000 MCG tablet Take 1,000 mcg by mouth 2 (two) times a week.  Tuesday and thursday   VITAMIN D PO Take 1,000 Units by mouth at bedtime.     Allergies:   Codeine   Social History   Socioeconomic History   Marital status: Widowed    Spouse name: Not on file   Number of children: Not on file   Years of education: Not on file   Highest education level: Not on file  Occupational History   Occupation: retired  Tobacco Use   Smoking status: Former    Types: Cigarettes    Quit date: 11/12/1978    Years since quitting: 43.9   Smokeless tobacco: Never  Vaping Use   Vaping Use: Never used  Substance and Sexual Activity   Alcohol use: No   Drug use: No    Sexual activity: Not Currently  Other Topics Concern   Not on file  Social History Narrative   Pt states her son and daughter in law live with her   Social Determinants of Health   Financial Resource Strain: [place]  (08/21/2022)   Overall Financial Resource Strain (CARDIA)    Difficulty of Paying Living Expenses: Not hard at all  Food Insecurity: No [place] (08/21/2022)   Hunger Vital Sign    Worried About Running Out of Food in the Last Year: Never true    [place] in the Last Year: Never true  Transportation Needs: No Transportation Needs (08/21/2022)   PRAPARE - Hydrologist (Medical): No    Lack of Transportation (Non-Medical): No  Physical Activity: Inactive (08/21/2022)   Exercise Vital Sign    Days of Exercise per Week: 0 days    Minutes of Exercise per Session: 0 min  Stress: No Stress Concern Present (08/21/2022)   [place]    Feeling of Stress : Not at all  Social Connections: Moderately Isolated (07/12/2021)   Social Connection and Isolation Panel [NHANES]    Frequency of Communication with Friends and Family: More than three times a week    Frequency of Social Gatherings with Friends and Family: Never    Attends Religious Services: More than 4 times per year    Active Member of [employer] or Organizations: No    Attends Archivist Meetings: Never    Marital Status: Widowed     Family History: The patient's family history is negative for Hypercalcemia.  ROS:   Please see the history of present illness.      All other systems reviewed and are negative.  EKGs/Labs/Other Studies Reviewed:    The following studies were reviewed today:  ECHO 04/20/2021  IMPRESSIONS    1. Left ventricular ejection fraction, by estimation, is 55 to 60%. The  left ventricle has normal function. The left ventricle has no regional  wall motion abnormalities.  There is mild left ventricular hypertrophy.  Left ventricular diastolic parameters  are indeterminate.   2. Right ventricular systolic function is low normal. The right  ventricular size is normal. There is mildly elevated pulmonary artery  systolic pressure. The estimated right ventricular systolic pressure is  97.3 mmHg.   3. Right atrial size was mildly dilated.   4. The mitral valve is normal in structure. Mild mitral valve  regurgitation. No evidence of mitral stenosis.   5. Tricuspid valve regurgitation is mild to moderate.   6. The aortic valve is tricuspid. Aortic valve regurgitation is not  visualized. Mild aortic valve sclerosis is present, with  no evidence of  aortic valve stenosis.   7. The inferior vena cava is normal in size with greater than 50%  respiratory variability, suggesting right atrial pressure of 3 mmHg.   8. Evidence of atrial level shunting detected by color flow Doppler.  There is a patent foramen ovale with predominantly left to right shunting  across the atrial septum.   LONG TERM MONITOR 04/05/2021  Study Highlights   Only wore monitor for 9 hours 100% A. fib burden, with average heart rate 71 bpm     Patch Wear Time:  0 days and 9 hours (2022-05-17T02:22:30-0400 to 2022-05-17T12:00:43-0400)   Atrial Fibrillation occurred continuously (100% burden), ranging from 56-105 bpm (avg of 71 bpm). Isolated VEs were rare (<1.0%), VE Couplets were rare (<1.0%), and no VE Triplets were present.   Echo 11/13/2019:   IMPRESSIONS    1. Left ventricular ejection fraction, by visual estimation, is 60 to  65%. The left ventricle has normal function. There is borderline left  ventricular hypertrophy.   2. Left ventricular diastolic parameters are indeterminate.   3. The left ventricle has no regional wall motion abnormalities.   4. Global right ventricle has normal systolic function.The right  ventricular size is mildly enlarged. No increase in right ventricular  wall  thickness.   5. Left atrial size was normal.   6. Right atrial size was normal.   7. The mitral valve is grossly normal. Mild mitral valve regurgitation.   8. The tricuspid valve is grossly normal.   9. The aortic valve is tricuspid. Aortic valve regurgitation is not  visualized.  10. The pulmonic valve was grossly normal. Pulmonic valve regurgitation is  trivial.  11. Mildly elevated pulmonary artery systolic pressure.  12. The tricuspid regurgitant velocity is 2.80 m/s, and with an assumed  right atrial pressure of 8 mmHg, the estimated right ventricular systolic  pressure is mildly elevated at 39.4 mmHg.  13. The inferior vena cava is dilated in size with >50% respiratory  variability, suggesting right atrial pressure of 8 mmHg.  14. Evidence of atrial level shunting detected by color flow Doppler.  EKG:   10/22/2022: Atrial fibrillation, rate 89, poor R wave progression, Q waves in V1/2. 05/31/2021: atrial fibrillation, rate 105, PVCs 03/24/21: atrial fibrillation, rate 97, left axis deviation, poor R wave progression  Recent Labs: 01/05/2022: BNP 684.9 03/04/2022: ALT 13; Magnesium 1.8 06/11/2022: BUN 19; Creatinine, Ser 0.79; Hemoglobin 14.9; Platelets 200; Potassium 4.0; Sodium 136  Recent Lipid Panel    Component Value Date/Time   CHOL 184 05/19/2021 0834   TRIG 81.0 05/19/2021 0834   HDL 63.10 05/19/2021 0834   CHOLHDL 3 05/19/2021 0834   VLDL 16.2 05/19/2021 0834   LDLCALC 105 (H) 05/19/2021 0834   LDLDIRECT 121.2 06/17/2012 0931    Physical Exam:    VS:  BP (!) 142/78   Pulse 89   Ht 5' 2.5" (1.588 m)   Wt 136 lb 12.8 oz (62.1 kg)   SpO2 97%   BMI 24.62 kg/m     Wt Readings from Last 3 Encounters:  10/22/22 136 lb 12.8 oz (62.1 kg)  08/21/22 139 lb (63 kg)  08/09/22 136 lb 3.2 oz (61.8 kg)     GEN:  in no acute distress HEENT: Normal NECK: No JVD LYMPHATICS: No lymphadenopathy CARDIAC: Irregular, normal rate, no murmurs, rubs,  gallops RESPIRATORY:  Clear to auscultation without rales, wheezing or rhonchi  ABDOMEN: Soft, non-tender, non-distended MUSCULOSKELETAL: 1+edema SKIN: Warm and dry NEUROLOGIC:  Alert and oriented x 3 PSYCHIATRIC:  Normal affect   ASSESSMENT:    1. Permanent atrial fibrillation ([place])   2. Chronic diastolic heart failure ([place])   3. Chronic hypoxic respiratory failure ([place])   4. Essential hypertension   5. Hyperlipidemia, unspecified hyperlipidemia type       PLAN:    Atrial fibrillation: Likely chronic A-fib.  From review of prior EKGs, appears to have been in A. fib since her PE in January 2021.  CHA2DS2-VASc 5 (hypertension, age x2, PE, female).  Zio patch was prescribed for 3 days on 04/05/2021 but only wore for 9 hours, showed 100% A. fib burden with average rate 71 bpm.  Echocardiogram on 04/20/2021 showed normal biventricular function RVSP 35, mild to moderate TR, mild MR, PFO.  Zio patch x3 days 06/12/2021 showed 100% A-fib burden with average rate 75 bpm. -Continue Eliquis 5 mg twice daily.  Check CBC -Rate controlled despite no AV nodal blocking agents   Chronic diastolic heart failure: Echocardiogram on 04/20/2021 showed normal biventricular function RVSP 35, mild to moderate TR, mild MR, PFO.  On Lasix 20 mg daily.  Admitted 12/2021 with acute on chronic diastolic heart failure, responded well to IV Lasix. -Appears euvolemic, continue Lasix 20 mg daily. Check BMET, magnesium  Chronic hypoxic respiratory failure: Requiring home O2 since hospitalization 12/2021.  Likely multifactorial with diastolic heart failure contributing, but also with small chronic PE and emphysematous changes to the lungs on CT chest.  Follows with pulmonology  Hypertension: Previously on losartan and amlodipine, currently off any antihypertensives due to episodes of hypotension and presyncope.  BP mildly elevated in clinic but reports normotensive when checks at home  Hyperlipidemia: On pravastatin 20 mg daily.   LDL 105 on 05/19/21  RTC in 1 year  Medication Adjustments/Labs and Tests Ordered: Current medicines are reviewed at length with the patient today.  Concerns regarding medicines are outlined above.  Orders Placed This Encounter  Procedures   Basic metabolic panel   CBC   Magnesium   EKG 12-Lead    No orders of the defined types were placed in this encounter.    Patient Instructions  Medication Instructions:  Your physician recommends that you continue on your current medications as directed. Please refer to the Current Medication list given to you today.  *If you need a refill on your cardiac medications before your next appointment, please call your pharmacy*   Lab Work: BMET. CBC, Mag today   If you have labs (blood work) drawn today and your tests are completely normal, you will receive your results only by: [place] (if you have MyChart) OR A paper copy in the mail If you have any lab test that is abnormal or we need to change your treatment, we will call you to review the results.  Follow-Up: At [facility], you and your health needs are our priority.  As part of our continuing mission to provide you with exceptional heart care, we have created designated Provider Care Teams.  These Care Teams include your primary Cardiologist (physician) and Advanced Practice Providers (APPs -  Physician Assistants and Nurse Practitioners) who all work together to provide you with the care you need, when you need it.  We recommend signing up for the patient portal called "MyChart".  Sign up information is provided on this After Visit Summary.  MyChart is used to connect with patients for Virtual Visits (Telemedicine).  Patients are able to view lab/test results, encounter notes, upcoming appointments,  etc.  Non-urgent messages can be sent to your provider as well.   To learn more about what you can do with MyChart, go to [email or web address].    Your next appointment:    12 month(s)  The format for your next appointment:   In Person  Provider:   [name], MD              Signed, [name], MD  10/22/2022 10:34 AM    [place]

## 2022-10-22 ENCOUNTER — Encounter: Payer: Self-pay | Admitting: Cardiology

## 2022-10-22 ENCOUNTER — Ambulatory Visit: Payer: Medicare HMO | Attending: Cardiology | Admitting: Cardiology

## 2022-10-22 ENCOUNTER — Telehealth: Payer: Self-pay | Admitting: *Deleted

## 2022-10-22 VITALS — BP 142/78 | HR 89 | Ht 62.5 in | Wt 136.8 lb

## 2022-10-22 DIAGNOSIS — I5032 Chronic diastolic (congestive) heart failure: Secondary | ICD-10-CM

## 2022-10-22 DIAGNOSIS — I10 Essential (primary) hypertension: Secondary | ICD-10-CM | POA: Diagnosis not present

## 2022-10-22 DIAGNOSIS — J9611 Chronic respiratory failure with hypoxia: Secondary | ICD-10-CM | POA: Diagnosis not present

## 2022-10-22 DIAGNOSIS — I4821 Permanent atrial fibrillation: Secondary | ICD-10-CM

## 2022-10-22 DIAGNOSIS — E785 Hyperlipidemia, unspecified: Secondary | ICD-10-CM

## 2022-10-22 DIAGNOSIS — J449 Chronic obstructive pulmonary disease, unspecified: Secondary | ICD-10-CM | POA: Diagnosis not present

## 2022-10-22 NOTE — Telephone Encounter (Signed)
Eliquis patient assistance faxed to [facility] [place]-patient provided copy.

## 2022-10-22 NOTE — Telephone Encounter (Signed)
Pt ready for scheduling on or after 10/22/22  Out-of-pocket cost due at time of visit: $443  Primary: [employer] Medicare Gold Plus Even[place] co-insurance: 20% (approximately $418) Admin fee co-insurance: 20% (approximately $25)  Secondary: n/a Evenity co-insurance:  Admin fee co-insurance:   Deductible: does not apply  Prior Auth: APPROVED [name] - PA Case ID: [id number] Valid: 10/18/22-10/18/23  ** This summary of benefits is an estimation of the patient's out-of-pocket cost. Exact cost may vary based on individual plan coverage.

## 2022-10-22 NOTE — Telephone Encounter (Signed)
Key: [id number] - PA Case ID: [id number] Valid: 10/18/22-10/18/23

## 2022-10-22 NOTE — Patient Instructions (Signed)
Medication Instructions:  Your physician recommends that you continue on your current medications as directed. Please refer to the Current Medication list given to you today.  *If you need a refill on your cardiac medications before your next appointment, please call your pharmacy*   Lab Work: BMET. CBC, Mag today   If you have labs (blood work) drawn today and your tests are completely normal, you will receive your results only by: [place] (if you have MyChart) OR A paper copy in the mail If you have any lab test that is abnormal or we need to change your treatment, we will call you to review the results.  Follow-Up: At [facility], you and your health needs are our priority.  As part of our continuing mission to provide you with exceptional heart care, we have created designated Provider Care Teams.  These Care Teams include your primary Cardiologist (physician) and Advanced Practice Providers (APPs -  Physician Assistants and Nurse Practitioners) who all work together to provide you with the care you need, when you need it.  We recommend signing up for the patient portal called "MyChart".  Sign up information is provided on this After Visit Summary.  MyChart is used to connect with patients for Virtual Visits (Telemedicine).  Patients are able to view lab/test results, encounter notes, upcoming appointments, etc.  Non-urgent messages can be sent to your provider as well.   To learn more about what you can do with MyChart, go to [email or web address].    Your next appointment:   12 month(s)  The format for your next appointment:   In Person  Provider:   [name], MD

## 2022-10-23 LAB — BASIC METABOLIC PANEL
BUN/Creatinine Ratio: 26 (ref 12–28)
BUN: 21 mg/dL (ref 8–27)
CO2: 28 mmol/L (ref 20–29)
Calcium: 10.7 mg/dL — ABNORMAL HIGH (ref 8.7–10.3)
Chloride: 97 mmol/L (ref 96–106)
Creatinine, Ser: 0.82 mg/dL (ref 0.57–1.00)
Glucose: 85 mg/dL (ref 70–99)
Potassium: 4.2 mmol/L (ref 3.5–5.2)
Sodium: 139 mmol/L (ref 134–144)
eGFR: 68 mL/min/{1.73_m2} (ref >59)

## 2022-10-23 LAB — CBC
Hematocrit: 43 % (ref 34.0–46.6)
Hemoglobin: 14.4 g/dL (ref 11.1–15.9)
MCH: 30.9 pg (ref 26.6–33.0)
MCHC: 33.5 g/dL (ref 31.5–35.7)
MCV: 92 fL (ref 79–97)
Platelets: 206 10*3/uL (ref 150–450)
RBC: 4.66 x10E6/uL (ref 3.77–5.28)
RDW: 12 % (ref 11.7–15.4)
WBC: 5 10*3/uL (ref 3.4–10.8)

## 2022-10-23 LAB — MAGNESIUM: Magnesium: 1.9 mg/dL (ref 1.6–2.3)

## 2022-10-29 ENCOUNTER — Other Ambulatory Visit: Payer: Self-pay | Admitting: Cardiology

## 2022-10-29 DIAGNOSIS — R790 Abnormal level of blood mineral: Secondary | ICD-10-CM

## 2022-10-29 NOTE — Telephone Encounter (Signed)
Annette [name], If patient agrees, we can try Prolia.  This is a once every 6 months injection. Brandy, could resubmit her to the Prolia portal? Thank you! CG

## 2022-10-29 NOTE — Telephone Encounter (Signed)
Patient does not want to schedule appointment for the Evenity injection because she can not afford to pay that much out of pocket.

## 2022-11-07 NOTE — Telephone Encounter (Signed)
Prolia VOB initiated via [email or web address] ? ?New start ? ?

## 2022-11-07 NOTE — Telephone Encounter (Signed)
Prior Authorization initiated for PROLIA via [email or web address] KEY: [id number]

## 2022-11-16 ENCOUNTER — Other Ambulatory Visit: Payer: Self-pay

## 2022-11-16 DIAGNOSIS — M8580 Other specified disorders of bone density and structure, unspecified site: Secondary | ICD-10-CM

## 2022-11-16 MED ORDER — CINACALCET HCL 30 MG PO TABS: 30.0000 mg | ORAL_TABLET | Freq: Every day | ORAL | 1 refills | Status: DC

## 2022-11-16 NOTE — Telephone Encounter (Signed)
Pt called to request a rx for Sensipar 30 mg be sent to the pharmacy.

## 2022-11-26 ENCOUNTER — Encounter: Payer: Self-pay | Admitting: Family Medicine

## 2022-11-26 DIAGNOSIS — U071 COVID-19: Secondary | ICD-10-CM | POA: Diagnosis not present

## 2022-11-26 DIAGNOSIS — R051 Acute cough: Secondary | ICD-10-CM | POA: Diagnosis not present

## 2022-11-28 ENCOUNTER — Ambulatory Visit (HOSPITAL_BASED_OUTPATIENT_CLINIC_OR_DEPARTMENT_OTHER): Payer: Medicare HMO | Admitting: Pulmonary Disease

## 2022-11-29 NOTE — Telephone Encounter (Signed)
Daughter in law calling stating patient has a hard, deep productive cough, started last night. Requests a call.  D-I-L can be reached at [phone]

## 2022-11-30 ENCOUNTER — Telehealth: Payer: Self-pay

## 2022-11-30 MED ORDER — ALBUTEROL SULFATE HFA 108 (90 BASE) MCG/ACT IN AERS: INHALATION_SPRAY | RESPIRATORY_TRACT | 0 refills | Status: DC

## 2022-11-30 MED ORDER — BENZONATATE 100 MG PO CAPS: 100.0000 mg | ORAL_CAPSULE | Freq: Two times a day (BID) | ORAL | 0 refills | Status: DC | PRN

## 2022-11-30 NOTE — Telephone Encounter (Signed)
I think it would be ok to use the tessalon perles for her-- does she have inhalers and nebulizer treatments at home too?

## 2022-11-30 NOTE — Telephone Encounter (Signed)
Ok to send in tessalon 100 mg BID PRN #20 rf 0 She also might try albuterol inhaler 90 mcg, 2 puff every 4-6 hours as needed for cough, disp 1, rf 0 -- ok to call in both

## 2022-11-30 NOTE — Telephone Encounter (Signed)
Patient tested positive for COVID on 11/26/22, given Rx for molnupiravir. Her cough was like a dry hacking cough in the beginning but now it has become a deeper cough in her chest. Daughter in law is concerned given pt's age and that she is on oxygen. They haven't had to alter her oxygen liters. Wanting to know what patient can use for cough; her son was given tessalon pearls and those worked well for him.  Pharmacy is [facility] on [address] - chart updated.  Dr. Legrand Como - can you advise in pcp's absence? Thank you!

## 2022-11-30 NOTE — Addendum Note (Signed)
Addended by: Rodrigo Ran on: 11/30/2022 03:58 PM   Modules accepted: Orders

## 2022-11-30 NOTE — Telephone Encounter (Signed)
Can we get her scheduled for a f/u to address this? Thanks!

## 2022-11-30 NOTE — Telephone Encounter (Signed)
See telephone encounter.

## 2022-11-30 NOTE — Telephone Encounter (Signed)
I spoke with Annette [name]. She is aware that both Rx's have been sent in. She will send an update on pt Monday.

## 2022-11-30 NOTE — Telephone Encounter (Signed)
She does not have an inhaler at home. Can you advise on which dosage for tessalon perles and sig? Thank you!

## 2022-12-05 ENCOUNTER — Ambulatory Visit: Payer: Medicare HMO | Admitting: Family Medicine

## 2022-12-05 NOTE — Telephone Encounter (Signed)
Pt ready for scheduling on or after 12/05/22  Out-of-pocket cost due at time of visit: $327  Primary: [employer] Medicare Adv HMO POS Prolia co-insurance: 20% (approximately $302) Admin fee co-insurance: 20% (approximately $25)  Deductible: does not apply  Secondary: n/a Prolia co-insurance:  Admin fee co-insurance:   Deductible:   Prior Auth: APPROVED PA# [id number], KEYPage Spiro Valid: 11/07/22-11/12/23  ** This summary of benefits is an estimation of the patient's out-of-pocket cost. Exact cost may vary based on individual plan coverage.

## 2022-12-06 NOTE — Telephone Encounter (Signed)
Patient was called to schedule Prolia injection and patient states that she will not be doing the injections.  Patient states that she is taking a pill and cannot afford Prolia.

## 2022-12-10 NOTE — Progress Notes (Unsigned)
HPI: [name] is a 89 y.o. female, who is here today for chronic disease management.  Last seen on 06/05/22. Hypertension:  She is now on non pharmacologic treatment. Home BP's 100's/60-70, occasionally SBP's 90's. She reports being cautious about her salt intake and has been consuming a low-salt diet Atrial fib on Eliquis 5 mg bid. Diastolic dysfunction, negative for orthopnea or PND. Follows with cardiologist annually. Negative for unusual or severe headache, visual changes, exertional chest pain, worsening dyspnea,  focal weakness, or edema. She takes Furosemide mg 1/2 tab daily. Lab Results  Component Value Date   CREATININE 0.82 10/22/2022   BUN 21 10/22/2022   NA 139 10/22/2022   K 4.2 10/22/2022   CL 97 10/22/2022   CO2 28 10/22/2022   COPD and chronic respiratory failure on supplemental O2 2 LPM continues. She is on Albuterol inh 1-2 puff q 4-6 hours as needed, has not needed in a while. She follows with pulmonologist.  Primary hypoparathyroidism on Sensipar 30 mg daily. She follows with endocrinologist.  She would like blood work done today.  Review of Systems  Constitutional:  Positive for fatigue. Negative for appetite change, chills and fever.  Respiratory:  Negative for cough and wheezing.   Gastrointestinal:  Negative for abdominal pain, nausea and vomiting.  Genitourinary:  Negative for decreased urine volume, dysuria and hematuria.  Musculoskeletal:  Positive for arthralgias and gait problem.  Skin:  Negative for rash.  Neurological:  Negative for syncope and facial asymmetry.  See other pertinent positives and negatives in HPI.  Current Outpatient Medications on File Prior to Visit  Medication Sig Dispense Refill   acetaminophen (TYLENOL) 500 MG tablet Take 500 mg by mouth 3 (three) times daily.     albuterol (VENTOLIN HFA) 108 (90 Base) MCG/ACT inhaler Inhale 2 puffs every 4-6 hours as needed for cough 8 g 0   apixaban (ELIQUIS) 5 MG  TABS tablet Take 1 tablet (5 mg total) by mouth 2 (two) times daily. 60 tablet 0   cinacalcet (SENSIPAR) 30 MG tablet Take 1 tablet (30 mg total) by mouth daily. 90 tablet 1   furosemide (LASIX) 40 MG tablet Take 0.5 tablets (20 mg total) by mouth daily. 90 tablet 3   latanoprost (XALATAN) 0.005 % ophthalmic solution INSTILL 1 DROP INTO BOTH EYES AT BEDTIME (Patient taking differently: Place 1 drop into both eyes at bedtime.) 7.5 mL 3   magnesium oxide (MAG-OX) 400 (240 Mg) MG tablet TAKE 1 TABLET EVERY DAY 90 tablet 3   omeprazole (PRILOSEC) 40 MG capsule TAKE 1 CAPSULE EVERY DAY 90 capsule 3   OXYGEN Inhale into the lungs.     pravastatin (PRAVACHOL) 20 MG tablet Take 1 tablet (20 mg total) by mouth daily. 90 tablet 2   vitamin B-12 (CYANOCOBALAMIN) 1000 MCG tablet Take 1,000 mcg by mouth 2 (two) times a week. Tuesday and thursday     VITAMIN D PO Take 1,000 Units by mouth at bedtime.     No current facility-administered medications on file prior to visit.   Past Medical History:  Diagnosis Date   DVT (deep venous thrombosis) ([place])    GERD 10/03/2007   HYPERTENSION 10/03/2007   OSTEOPENIA 02/03/2008   Primary hyperparathyroidism ([place]) 10/03/2007   Allergies  Allergen Reactions   Codeine Nausea And Vomiting   Social History   Socioeconomic History   Marital status: Widowed    Spouse name: Not on file   Number of children: Not on file  Years of education: Not on file   Highest education level: Not on file  Occupational History   Occupation: retired  Tobacco Use   Smoking status: Former    Types: Cigarettes    Quit date: 11/12/1978    Years since quitting: 44.1   Smokeless tobacco: Never  Vaping Use   Vaping Use: Never used  Substance and Sexual Activity   Alcohol use: No   Drug use: No   Sexual activity: Not Currently  Other Topics Concern   Not on file  Social History Narrative   Pt states her son and daughter in law live with her   Social Determinants of Health    Financial Resource Strain: [place]  (08/21/2022)   Overall Financial Resource Strain (CARDIA)    Difficulty of Paying Living Expenses: Not hard at all  Food Insecurity: No [place] (08/21/2022)   Hunger Vital Sign    Worried About Running Out of Food in the Last Year: Never true    [place] in the Last Year: Never true  Transportation Needs: No Transportation Needs (08/21/2022)   PRAPARE - Hydrologist (Medical): No    Lack of Transportation (Non-Medical): No  Physical Activity: Inactive (08/21/2022)   Exercise Vital Sign    Days of Exercise per Week: 0 days    Minutes of Exercise per Session: 0 min  Stress: No Stress Concern Present (08/21/2022)   [place]    Feeling of Stress : Not at all  Social Connections: Moderately Isolated (07/12/2021)   Social Connection and Isolation Panel [NHANES]    Frequency of Communication with Friends and Family: More than three times a week    Frequency of Social Gatherings with Friends and Family: Never    Attends Religious Services: More than 4 times per year    Active Member of [employer] or Organizations: No    Attends Archivist Meetings: Never    Marital Status: Widowed   Vitals:   12/11/22 0909  BP: 120/70  Pulse: 97  Resp: 16  Temp: 97.7 F (36.5 C)  SpO2: 98%   Body mass index is 24.14 kg/m.  Physical Exam Vitals and nursing note reviewed.  Constitutional:      General: She is not in acute distress.    Appearance: She is well-developed.  HENT:     Head: Normocephalic and atraumatic.     Mouth/Throat:     Mouth: Mucous membranes are moist.     Pharynx: Oropharynx is clear.  Eyes:     Conjunctiva/sclera: Conjunctivae normal.  Cardiovascular:     Rate and Rhythm: Normal rate. Rhythm irregular.     Heart sounds: No murmur heard. Pulmonary:     Effort: Pulmonary effort is normal. No respiratory distress.      Breath sounds: Normal breath sounds.     Comments: On continues O2 supplementation per [place] 2 LPM. Abdominal:     Palpations: Abdomen is soft.     Tenderness: There is no abdominal tenderness.  Musculoskeletal:     Right lower leg: No edema.     Left lower leg: No edema.  Skin:    General: Skin is warm.     Findings: No erythema or rash.  Neurological:     General: No focal deficit present.     Mental Status: She is alert and oriented to person, place, and time.     Comments: Unstable gait  assisted with a walker.  Psychiatric:        Mood and Affect: Affect normal. Mood is anxious.   ASSESSMENT AND PLAN:  [name] was seen today for medical management of chronic issues.  Diagnoses and all orders for this visit: Lab Results  Component Value Date   CREATININE 0.66 12/11/2022   BUN 17 12/11/2022   NA 133 (L) 12/11/2022   K 3.6 12/11/2022   CL 96 12/11/2022   CO2 28 12/11/2022   Lab Results  Component Value Date   ALT 14 12/11/2022   AST 23 12/11/2022   ALKPHOS 90 12/11/2022   BILITOT 0.8 12/11/2022   Essential hypertension Assessment & Plan: Still having occasional SBP's in the 90's. Continue non pharmacologic treatment. Adequate hydration. Continue monitoring BP.  Orders: -     Comprehensive metabolic panel; Future  Persistent atrial fibrillation with rapid ventricular response (HCC) Assessment & Plan: Continue Eliquis 5 mg bid. Follows with cardiologist with cardiologist.   Chronic pulmonary embolism without acute cor pulmonale, unspecified pulmonary embolism type ([place]) Assessment & Plan: Continue Eliquis 5 mg bid.   Centrilobular emphysema ([place]) Assessment & Plan: Stable. Continue Albuterol inh 1-2 puff q 4-6 hours as needed. Follows with pulmonologist.   Primary hyperparathyroidism [facility]) Assessment & Plan:  She is on sensipar 30 mg daily. Follows with endocrinologist.   Chronic respiratory failure with hypoxia ([place]) Assessment &  Plan: Stable on supplemental O2 2 LPM per [place]. Follows with pulmonologist.   Chronic diastolic (congestive) heart failure (HCC) Assessment & Plan: Continue Furosemide 20 mg daily. Low salt diet. Follows with cardiologist.   Return in about 6 months (around 06/11/2023) for chronic problems.  Jilliann Subramanian G. [place], MD  [facility]. [place] office.

## 2022-12-11 ENCOUNTER — Encounter: Payer: Self-pay | Admitting: Family Medicine

## 2022-12-11 ENCOUNTER — Ambulatory Visit (INDEPENDENT_AMBULATORY_CARE_PROVIDER_SITE_OTHER): Payer: Medicare HMO | Admitting: Family Medicine

## 2022-12-11 VITALS — BP 120/70 | HR 97 | Temp 97.7°F | Resp 16 | Ht 62.5 in | Wt 134.1 lb

## 2022-12-11 DIAGNOSIS — I4819 Other persistent atrial fibrillation: Secondary | ICD-10-CM | POA: Diagnosis not present

## 2022-12-11 DIAGNOSIS — I10 Essential (primary) hypertension: Secondary | ICD-10-CM

## 2022-12-11 DIAGNOSIS — I5032 Chronic diastolic (congestive) heart failure: Secondary | ICD-10-CM

## 2022-12-11 DIAGNOSIS — J432 Centrilobular emphysema: Secondary | ICD-10-CM

## 2022-12-11 DIAGNOSIS — E210 Primary hyperparathyroidism: Secondary | ICD-10-CM

## 2022-12-11 DIAGNOSIS — I2782 Chronic pulmonary embolism: Secondary | ICD-10-CM | POA: Diagnosis not present

## 2022-12-11 DIAGNOSIS — J9611 Chronic respiratory failure with hypoxia: Secondary | ICD-10-CM | POA: Diagnosis not present

## 2022-12-11 LAB — COMPREHENSIVE METABOLIC PANEL
ALT: 14 U/L (ref 0–35)
AST: 23 U/L (ref 0–37)
Albumin: 4.3 g/dL (ref 3.5–5.2)
Alkaline Phosphatase: 90 U/L (ref 39–117)
BUN: 17 mg/dL (ref 6–23)
CO2: 28 mEq/L (ref 19–32)
Calcium: 10.3 mg/dL (ref 8.4–10.5)
Chloride: 96 mEq/L (ref 96–112)
Creatinine, Ser: 0.66 mg/dL (ref 0.40–1.20)
GFR: 77.39 mL/min (ref >60.00)
Glucose, Bld: 83 mg/dL (ref 70–99)
Potassium: 3.6 mEq/L (ref 3.5–5.1)
Sodium: 133 mEq/L — ABNORMAL LOW (ref 135–145)
Total Bilirubin: 0.8 mg/dL (ref 0.2–1.2)
Total Protein: 6.8 g/dL (ref 6.0–8.3)

## 2022-12-11 NOTE — Patient Instructions (Addendum)
A few things to remember from today's visit:  Essential hypertension - Plan: Comprehensive metabolic panel  Persistent atrial fibrillation with rapid ventricular response (HCC)  No changes today. You have an appt with endocrinologist in April and pulmonologist in March. Cardiologist in December. I will see you back in 6 months and after we could continue following annually.  If you need refills for medications you take chronically, please call your pharmacy. Do not use My Chart to request refills or for acute issues that need immediate attention. If you send a my chart message, it may take a few days to be addressed, specially if I am not in the office.  Please be sure medication list is accurate. If a new problem present, please set up appointment sooner than planned today.

## 2022-12-13 NOTE — Assessment & Plan Note (Signed)
She is on sensipar 30 mg daily. Follows with endocrinologist.

## 2022-12-13 NOTE — Assessment & Plan Note (Signed)
Continue Furosemide 20 mg daily. Low salt diet. Follows with cardiologist.

## 2022-12-13 NOTE — Assessment & Plan Note (Signed)
Still having occasional SBP's in the 90's. Continue non pharmacologic treatment. Adequate hydration. Continue monitoring BP.

## 2022-12-13 NOTE — Assessment & Plan Note (Signed)
Continue Eliquis 5 mg bid. Follows with cardiologist with cardiologist.

## 2022-12-13 NOTE — Assessment & Plan Note (Signed)
Stable. Continue Albuterol inh 1-2 puff q 4-6 hours as needed. Follows with pulmonologist.

## 2022-12-13 NOTE — Assessment & Plan Note (Signed)
Stable on supplemental O2 2 LPM per [place]. Follows with pulmonologist.

## 2022-12-13 NOTE — Assessment & Plan Note (Signed)
Continue Eliquis 5 mg b.i.d..

## 2022-12-14 ENCOUNTER — Telehealth: Payer: Self-pay | Admitting: Cardiology

## 2022-12-14 NOTE — Telephone Encounter (Signed)
Spoke with patient who reports she is denied for patient assistance. She reports her co-pay is $45/month or $135/90 days. She was denied for not having denial from Medicare LIS. Sent her the website in [place] on how to apply for this. If she has a denial letter, then she can reapply. Explained that only generic option is warfarin but this requires blood monitoring and has more interactions  Routed to Dr. Lorelee Cover RN

## 2022-12-14 NOTE — Telephone Encounter (Signed)
Pt c/o medication issue:  1. Name of Medication:   apixaban (ELIQUIS) 5 MG TABS tablet    2. How are you currently taking this medication (dosage and times per day)?   3. Are you having a reaction (difficulty breathing--STAT)?   4. What is your medication issue? Pt states her patient assistance has been denied and needs to know what she should do

## 2022-12-17 NOTE — Telephone Encounter (Signed)
Patient states she received a letter and call informing her that she has been accepted into patient assistance through [facility] for eliquis. She also reports she has received the medication. She states a letter is being sent to the office regarding the acceptance, but she wanted to call and make the office aware. Please advise.

## 2022-12-17 NOTE — Telephone Encounter (Signed)
Will route to primary RN as a FYI.  Thanks!

## 2023-01-07 NOTE — Telephone Encounter (Signed)
Pt archived in [email or web address].  Please advise if patient and/or provider wish to proceed with [employer] therpay.  

## 2023-01-21 ENCOUNTER — Ambulatory Visit (HOSPITAL_BASED_OUTPATIENT_CLINIC_OR_DEPARTMENT_OTHER): Payer: Medicare HMO | Admitting: Pulmonary Disease

## 2023-01-21 ENCOUNTER — Encounter (HOSPITAL_BASED_OUTPATIENT_CLINIC_OR_DEPARTMENT_OTHER): Payer: Self-pay | Admitting: Pulmonary Disease

## 2023-01-21 VITALS — BP 130/70 | HR 92 | Ht 62.5 in | Wt 136.0 lb

## 2023-01-21 DIAGNOSIS — J432 Centrilobular emphysema: Secondary | ICD-10-CM

## 2023-01-21 DIAGNOSIS — J9611 Chronic respiratory failure with hypoxia: Secondary | ICD-10-CM

## 2023-01-21 NOTE — Patient Instructions (Signed)
Emphysema --Patient currently not bronchodilators --Ok to hold off on using. Please contact us if you wish to start treatment for shortness of breath, cough or wheezing.  Chronic hypoxemic respiratory failure Exertional hypoxemia --Ambulatory O2 performed in clinic --CONTINUE supplemental oxygen for goal SpO2 >88%  Follow-up with me in 1 year

## 2023-01-21 NOTE — Progress Notes (Signed)
Subjective:   PATIENT ID: Annette [name] GENDER: female DOB: 02-28-1930, MRN: [id number]   HPI  Chief Complaint  Patient presents with   Follow-up    Never used albuterol    Reason for Visit: Follow-up emphysema  Ms. Annette [name] is a 89 year old female with chronic hypoxemic respiratory failure, chronic diastolic heart failure, atrial fibrillation, chronic pulmonary embolism dx 11/12/19, hypertension, GERD, history of pulmonary nodules and primary hyperparathyroidism who presents for follow-up.   Initial consul Referred by Dr. Gardiner Rhyme. Her son Elta Guadeloupe is present with her and provides additional history. She was seen by cardiology on 01/05/2022 with Dr. Gardiner Rhyme.  Has been diuresed for acute on chronic diastolic heart failure and was noted to be hypervolemic on exam.  Of note she was recently hospitalized in February for this issue and had CT chest completed.  Since her hospitalization she has continued to require home oxygen.  With her recent CT demonstrating emphysema and small chronic PE, she was referred to pulmonary for further evaluation. She is active and able to care for herself. Describes herself as sedentary but able to ambulate independently with walker. She has shortness of breath and wheezing related to her heart issues. This has improved but will still feel short of breath with minimal activity. Has been compliant with her oxygen at rest and with activity, 2L via [place]. She is anxious about taking off her oxygen.  05/08/2022 She reports is compliant with her oxygen. Denies shortness of breath, cough or wheezing. She is able to cook and care for herself. She does not have an exercise routine. She is homebound but is content with that. Denies recent respiratory infections in the last 12 months.  01/21/23 Her son is her caregiver and driver. Since our last visit she reports overall doing well. Ambulates with walker and sedentary at baseline. Denies shortness of breath, cough or  wheezing. She lives at home with her son's family. Wears oxygen during the day and nightly. Documented her BP with average SBP in 110s. She is limiting her salt intake. Denies any recent respiratory infections.  Social History: Quit smoking 50 years ago however describes herself as social smoker. She is unclear why she has COPD Second hand exposure Her son Elta Gu[place] is involved in her medical care  Past Medical History:  Diagnosis Date   DVT (deep venous thrombosis) ([place])    GERD 10/03/2007   HYPERTENSION 10/03/2007   OSTEOPENIA 02/03/2008   Primary hyperparathyroidism ([place]) 10/03/2007     Family History  Problem Relation Age of Onset   Hypercalcemia Neg Hx      Social History   Occupational History   Occupation: retired  Tobacco Use   Smoking status: Former    Types: Cigarettes    Quit date: 11/12/1978    Years since quitting: 44.2   Smokeless tobacco: Never  Vaping Use   Vaping Use: Never used  Substance and Sexual Activity   Alcohol use: No   Drug use: No   Sexual activity: Not Currently    Allergies  Allergen Reactions   Codeine Nausea And Vomiting     Outpatient Medications Prior to Visit  Medication Sig Dispense Refill   acetaminophen (TYLENOL) 500 MG tablet Take 500 mg by mouth 3 (three) times daily.     apixaban (ELIQUIS) 5 MG TABS tablet Take 1 tablet (5 mg total) by mouth 2 (two) times daily. 60 tablet 0   cinacalcet (SENSIPAR) 30 MG tablet Take 1 tablet (30 mg total) by mouth  daily. 90 tablet 1   furosemide (LASIX) 40 MG tablet Take 0.5 tablets (20 mg total) by mouth daily. 90 tablet 3   latanoprost (XALATAN) 0.005 % ophthalmic solution INSTILL 1 DROP INTO BOTH EYES AT BEDTIME (Patient taking differently: Place 1 drop into both eyes at bedtime.) 7.5 mL 3   magnesium oxide (MAG-OX) 400 (240 Mg) MG tablet TAKE 1 TABLET EVERY DAY 90 tablet 3   omeprazole (PRILOSEC) 40 MG capsule TAKE 1 CAPSULE EVERY DAY 90 capsule 3   OXYGEN Inhale into the lungs.      pravastatin (PRAVACHOL) 20 MG tablet Take 1 tablet (20 mg total) by mouth daily. 90 tablet 2   vitamin B-12 (CYANOCOBALAMIN) 1000 MCG tablet Take 1,000 mcg by mouth 2 (two) times a week. Tuesday and thursday     VITAMIN D PO Take 1,000 Units by mouth at bedtime.     albuterol (VENTOLIN HFA) 108 (90 Base) MCG/ACT inhaler Inhale 2 puffs every 4-6 hours as needed for cough 8 g 0   No facility-administered medications prior to visit.    Review of Systems  Constitutional:  Negative for chills, diaphoresis, fever, malaise/fatigue and weight loss.  HENT:  Negative for congestion.   Respiratory:  Negative for cough, hemoptysis, sputum production, shortness of breath and wheezing.   Cardiovascular:  Negative for chest pain, palpitations and leg swelling.     Objective:   Vitals:   01/21/23 0948  BP: 130/70  Pulse: 92  SpO2: 95%  Weight: 136 lb (61.7 kg)  Height: 5' 2.5" (1.588 m)   SpO2: 95 % (2L) O2 Device: Nasal cannula O2 Flow Rate (L/min): 2 L/min O2 Type: Continuous O2  Physical Exam: General: Frail-appearing, no acute distress HENT: [place], AT Eyes: EOMI, no scleral icterus Respiratory: Clear to auscultation bilaterally.  No crackles, wheezing or rales Cardiovascular: RRR, -M/R/G, no JVD Extremities:-Edema,-tenderness Neuro: AAO x4, CNII-XII grossly intact Psych: Normal mood, normal affect  Data Reviewed:  Imaging: CTA 11/12/2019-acute PE in the right lung distal main pulmonary artery extending to subsegmental branches.  Subsegmental PE in the left upper and lower lobes.  2 pulmonary nodules in the right lung measuring 5 mm.  Linear scarring in the superior segment of the left lower lobe CT chest 12/19/2021-small chronic pulmonary emboli lower lobe corresponding to PE and 11/12/2019.  Emphysema.  Small right pleural effusion with atelectasis.  Right lower lobe nodule measuring 5 mm, unchanged. Similar left lung scarring seen compared to 2020.  PFT: None on  file  Cardiac: Echo 04/20/2021-EF 55 to 60%.  No WMA.  Mild LVH.  Indeterminate diastolic parameters.  RV systolic function low normal.  Mildly elevated PASP.  RVSP 35.3. Mild dilation RA.  Mild MR.  Mild/moderate TR.  PFO with left-to-right shunting  Labs: CBC    Component Value Date/Time   WBC 5.0 10/22/2022 0920   WBC 9.1 06/11/2022 1345   RBC 4.66 10/22/2022 0920   RBC 4.84 06/11/2022 1345   HGB 14.4 10/22/2022 0920   HCT 43.0 10/22/2022 0920   PLT 206 10/22/2022 0920   MCV 92 10/22/2022 0920   MCH 30.9 10/22/2022 0920   MCH 30.8 06/11/2022 1345   MCHC 33.5 10/22/2022 0920   MCHC 33.1 06/11/2022 1345   RDW 12.0 10/22/2022 0920   LYMPHSABS 0.7 06/11/2022 1345   MONOABS 0.9 06/11/2022 1345   EOSABS 0.0 06/11/2022 1345   BASOSABS 0.0 06/11/2022 1345   Absolute eos 12/19/21 - 100     Latest Ref Rng & Units  12/11/2022   10:11 AM 10/22/2022    9:20 AM 06/11/2022    1:45 PM  BMP  Glucose 70 - 99 mg/dL 83  85  89   BUN 6 - 23 mg/dL '17  21  19   '$ Creatinine 0.40 - 1.20 mg/dL 0.66  0.82  0.79   BUN/Creat Ratio 12 - 28  26    Sodium 135 - 145 mEq/L 133  139  136   Potassium 3.5 - 5.1 mEq/L 3.6  4.2  4.0   Chloride 96 - 112 mEq/L 96  97  95   CO2 19 - 32 mEq/L 28  28  33   Calcium 8.4 - 10.5 mg/dL 10.3  10.7  10.5        Assessment & Plan:   Discussion: 89 year old female with emphysema, chronic hypoxemic respiratory failure, chronic diastolic heart failure, atrial fibrillation, chronic pulmonary embolism diagnosed 01/11/19, HTN, GERD, hx pulmonary nodules who presents for follow-up. Not on bronchodilators however asymptomatic as long as she has her oxygen.  Emphysema --Patient currently not bronchodilators --Ok to hold off on using. Please contact us if you wish to start treatment for shortness of breath, cough or wheezing.  Chronic hypoxemic respiratory failure Exertional hypoxemia --Ambulatory O2 performed in clinic --CONTINUE supplemental oxygen for goal SpO2  >88% --CONTINUE 2L with activity and sleep  Goals of Care She states if she were to have a life-threatening illness, she would not want CPR or life support. Her husband was in hospice and she believes that would a positive experience for her when her time comes. She would still want treatment including medication or fluids for any treatable conditions. --Please discuss with your family to update them on your goals  Health Maintenance Immunization History  Administered Date(s) Administered   Fluad Quad(high Dose 65+) 07/15/2019, 08/31/2020   Influenza Split 08/22/2011   Influenza Whole 08/10/2010   Influenza, High Dose Seasonal PF 08/04/2014, 08/05/2016, 08/04/2017, 07/24/2018   Influenza,inj,Quad PF,6+ Mos 08/10/2013   Influenza-Unspecified 07/27/2017, 09/21/2021, 08/30/2022   PFIZER(Purple Top)SARS-COV-2 Vaccination 01/10/2020, 02/03/2020, 02/22/2021   Pneumococcal Conjugate-13 08/10/2014   Pneumococcal Polysaccharide-23 08/10/2013   Tdap 08/31/2020   CT Lung Screen- not qualified due to age  No orders of the defined types were placed in this encounter.  No orders of the defined types were placed in this encounter.   Return in about 1 year (around 01/21/2024).  I have spent a total time of 32-minutes on the day of the appointment including chart review, data review, collecting history, coordinating care and discussing medical diagnosis and plan with the patient/family. Past medical history, allergies, medications were reviewed. Pertinent imaging, labs and tests included in this note have been reviewed and interpreted independently by me.  [place], MD [facility] 01/21/2023 4:34 PM  Office Number [phone]

## 2023-01-22 ENCOUNTER — Telehealth: Payer: Self-pay | Admitting: Pulmonary Disease

## 2023-01-22 NOTE — Telephone Encounter (Signed)
Mychart message sent to pt per her request of the oxygen test results from yesterday 3/11. Nothing further needed.

## 2023-01-22 NOTE — Telephone Encounter (Signed)
Patient would like results of oxygen test be put in her mychart. Patient phone number is [phone].

## 2023-02-11 ENCOUNTER — Other Ambulatory Visit: Payer: Self-pay | Admitting: Cardiology

## 2023-02-11 DIAGNOSIS — I5032 Chronic diastolic (congestive) heart failure: Secondary | ICD-10-CM

## 2023-02-12 ENCOUNTER — Ambulatory Visit: Payer: Medicare HMO | Admitting: Internal Medicine

## 2023-03-08 ENCOUNTER — Encounter: Payer: Self-pay | Admitting: Internal Medicine

## 2023-03-08 ENCOUNTER — Ambulatory Visit: Payer: Medicare HMO | Admitting: Internal Medicine

## 2023-03-08 VITALS — BP 160/98 | HR 100 | Ht 62.5 in | Wt 137.4 lb

## 2023-03-08 DIAGNOSIS — E210 Primary hyperparathyroidism: Secondary | ICD-10-CM | POA: Diagnosis not present

## 2023-03-08 DIAGNOSIS — M810 Age-related osteoporosis without current pathological fracture: Secondary | ICD-10-CM | POA: Diagnosis not present

## 2023-03-08 DIAGNOSIS — E559 Vitamin D deficiency, unspecified: Secondary | ICD-10-CM | POA: Diagnosis not present

## 2023-03-08 LAB — BASIC METABOLIC PANEL
BUN: 19 mg/dL (ref 6–23)
CO2: 31 mEq/L (ref 19–32)
Calcium: 10.5 mg/dL (ref 8.4–10.5)
Chloride: 96 mEq/L (ref 96–112)
Creatinine, Ser: 0.82 mg/dL (ref 0.40–1.20)
GFR: 63 mL/min (ref >60.00)
Glucose, Bld: 86 mg/dL (ref 70–99)
Potassium: 4.2 mEq/L (ref 3.5–5.1)
Sodium: 137 mEq/L (ref 135–145)

## 2023-03-08 LAB — VITAMIN D 25 HYDROXY (VIT D DEFICIENCY, FRACTURES): VITD: 49.71 ng/mL (ref 30.00–100.00)

## 2023-03-08 NOTE — Progress Notes (Signed)
Patient ID: Annette [name], female   DOB: May 15, 1930, 89 y.o.   MRN: [id number]  HPI  Annette [name] is a 89 y.o.-year-old female, returning for follow-up for hypercalcemia/hyperparathyroidism.  She was previously seen by Dr. Everardo All, but last visit with me 6 months ago.  Interim history: Since last visit, she was diagnosed with osteoporosis and was not able to start Evenity or Prolia due to high co-pays.  At today's visit, she mentions that she cannot afford any co-pays for now. She is on Lasix >> increased urination. No falls or fractures since last visit. She mentions she does not really get out of the house except for doctors appointments.  Her son is helping her with groceries and bringing her to the appointments. No dizziness/vertigo/vision problems.  Reviewed and addended history: Pt was dx with hypercalcemia approximately in 2008.   She was deemed not to be a surgical candidate and is treated with Sensipar 30 mg daily  - started when she saw Dr. Everardo All in 2021.  She felt much better, with more energy.  I reviewed pt's pertinent labs: Lab Results  Component Value Date   PTH 119 (H) 10/09/2021   PTH 109 (H) 06/06/2021   PTH 301 (H) 12/28/2020   PTH 186 (H) 07/01/2020   PTH 151 (H) 01/05/2020   PTH 127 (H) 01/12/2019   PTH 328 (H) 08/22/2018   PTH 319 (H) 05/20/2018   PTH 227.7 (H) 12/26/2009   CALCIUM 10.3 12/11/2022   CALCIUM 10.7 (H) 10/22/2022   CALCIUM 10.5 (H) 06/11/2022   CALCIUM 10.7 (H) 06/05/2022   CALCIUM 9.9 03/04/2022   CALCIUM 10.4 (H) 01/22/2022   CALCIUM 9.5 01/05/2022   CALCIUM 10.1 12/26/2021   CALCIUM 9.1 12/21/2021   CALCIUM 9.7 12/20/2021   She has a history of osteoporosis. I reviewed pt's latest DXA scan reports:  08/23/2022 ([facility]) Lumbar spine L1-L4(L2) Femoral neck (FN) 33% distal radius  T-score   -1.2 RFN: -3.7 LFN: -3.2 -4.6  Change in BMD from previous DXA test (%) Down 15.1%* Down 25.4%* n/a  (*) statistically significant   No  recent fractures or falls.  She did have a distant right wrist fracture in 1988 - fell backwards on level ground, tripping on tires.  She occasionally gets dizzy when she stands up, but no vertigo or disequilibrium.  She walks with a walker.  No h/o kidney stones.  No h/o CKD. Last BUN/Cr: Lab Results  Component Value Date   BUN 17 12/11/2022   BUN 21 10/22/2022   CREATININE 0.66 12/11/2022   CREATININE 0.82 10/22/2022   Pt is not on HCTZ.  She has a h/o vitamin D deficiency. Reviewed vit D levels: Lab Results  Component Value Date   VD25OH 58.42 08/09/2022   VD25OH 53.79 10/09/2021   VD25OH 57.31 06/06/2021   VD25OH 62.59 12/28/2020   VD25OH 37 07/01/2020   VD25OH 39.52 01/05/2020   VD25OH 38.92 07/15/2019   VD25OH 52.10 01/12/2019   VD25OH 19 (L) 08/22/2018   VD25OH 12.81 (L) 05/20/2018   Pt is on 1000 units vitamin D daily.  Calcitriol level was normal: Component     Latest Ref Rng 01/05/2020  Vitamin D 1, 25 (OH) Total     18 - 72 pg/mL 58   Vitamin D3 1, 25 (OH)     pg/mL 58   Vitamin D2 1, 25 (OH)     pg/mL <8    UPEP was normal: Component     Latest Ref Rng 12/21/2019  Protein Urine Random     Not Estab. mg/dL 16.1   Albumin ELP, Urine     % 56.0   Alpha-1-Globulin, U     % 1.7   ALPHA-2-GLOBULIN, U     % 12.3   Beta Globulin, U     % 18.0   Gamma Globulin, U     % 12.0   M Component, Ur     Not Observed % Not Observed   Please Note: Comment    Pt does not have a FH of hypercalcemia, pituitary tumors, thyroid cancer, or osteoporosis.   Pt. also has a history of HTN, GERD, glaucoma - on drops, h/o PE - on Eliquis.   ROS: + see HPI  Past Medical History:  Diagnosis Date   DVT (deep venous thrombosis) (HCC)    GERD 10/03/2007   HYPERTENSION 10/03/2007   OSTEOPENIA 02/03/2008   Primary hyperparathyroidism (HCC) 10/03/2007   Past Surgical History:  Procedure Laterality Date   ABDOMINAL HYSTERECTOMY     Social History   Socioeconomic  History   Marital status: Widowed    Spouse name: Not on file   Number of children: Not on file   Years of education: Not on file   Highest education level: Not on file  Occupational History   Occupation: retired  Tobacco Use   Smoking status: Former    Types: Cigarettes    Quit date: 11/12/1978    Years since quitting: 44.3   Smokeless tobacco: Never  Vaping Use   Vaping Use: Never used  Substance and Sexual Activity   Alcohol use: No   Drug use: No   Sexual activity: Not Currently  Other Topics Concern   Not on file  Social History Narrative   Pt states her son and daughter in law live with her   Social Determinants of Health   Financial Resource Strain: Low Risk  (08/21/2022)   Overall Financial Resource Strain (CARDIA)    Difficulty of Paying Living Expenses: Not hard at all  Food Insecurity: No Food Insecurity (08/21/2022)   Hunger Vital Sign    Worried About Running Out of Food in the Last Year: Never true    Ran Out of Food in the Last Year: Never true  Transportation Needs: No Transportation Needs (08/21/2022)   PRAPARE - Administrator, Civil Service (Medical): No    Lack of Transportation (Non-Medical): No  Physical Activity: Inactive (08/21/2022)   Exercise Vital Sign    Days of Exercise per Week: 0 days    Minutes of Exercise per Session: 0 min  Stress: No Stress Concern Present (08/21/2022)   [employer] of Occupational Health - Occupational Stress Questionnaire    Feeling of Stress : Not at all  Social Connections: Moderately Isolated (07/12/2021)   Social Connection and Isolation Panel [NHANES]    Frequency of Communication with Friends and Family: More than three times a week    Frequency of Social Gatherings with Friends and Family: Never    Attends Religious Services: More than 4 times per year    Active Member of [employer] or Organizations: No    Attends Banker Meetings: Never    Marital Status: Widowed  Intimate Partner  Violence: Not At Risk (07/12/2021)   Humiliation, Afraid, Rape, and Kick questionnaire    Fear of Current or Ex-Partner: No    Emotionally Abused: No    Physically Abused: No    Sexually Abused: No   Current Outpatient  Medications on File Prior to Visit  Medication Sig Dispense Refill   acetaminophen (TYLENOL) 500 MG tablet Take 500 mg by mouth 3 (three) times daily.     albuterol (VENTOLIN HFA) 108 (90 Base) MCG/ACT inhaler Inhale 2 puffs every 4-6 hours as needed for cough 8 g 0   apixaban (ELIQUIS) 5 MG TABS tablet Take 1 tablet (5 mg total) by mouth 2 (two) times daily. 60 tablet 0   cinacalcet (SENSIPAR) 30 MG tablet Take 1 tablet (30 mg total) by mouth daily. 90 tablet 1   furosemide (LASIX) 40 MG tablet TAKE 1 TABLET EVERY DAY 90 tablet 2   latanoprost (XALATAN) 0.005 % ophthalmic solution INSTILL 1 DROP INTO BOTH EYES AT BEDTIME (Patient taking differently: Place 1 drop into both eyes at bedtime.) 7.5 mL 3   magnesium oxide (MAG-OX) 400 (240 Mg) MG tablet TAKE 1 TABLET EVERY DAY 90 tablet 3   omeprazole (PRILOSEC) 40 MG capsule TAKE 1 CAPSULE EVERY DAY 90 capsule 3   OXYGEN Inhale into the lungs.     pravastatin (PRAVACHOL) 20 MG tablet Take 1 tablet (20 mg total) by mouth daily. 90 tablet 2   vitamin B-12 (CYANOCOBALAMIN) 1000 MCG tablet Take 1,000 mcg by mouth 2 (two) times a week. Tuesday and thursday     VITAMIN D PO Take 1,000 Units by mouth at bedtime.     No current facility-administered medications on file prior to visit.   Allergies  Allergen Reactions   Codeine Nausea And Vomiting   Family History  Problem Relation Age of Onset   Hypercalcemia Neg Hx    PE: BP (!) 160/98 (BP Location: Right Arm, Patient Position: Sitting, Cuff Size: Normal)   Pulse 100   Ht 5' 2.5" (1.588 m)   Wt 137 lb 6.4 oz (62.3 kg)   SpO2 98%   BMI 24.73 kg/m  Wt Readings from Last 3 Encounters:  03/08/23 137 lb 6.4 oz (62.3 kg)  01/21/23 136 lb (61.7 kg)  12/11/22 134 lb 2 oz (60.8  kg)   Constitutional: normal weight, in NAD, walks with a walker, on oxygen Eyes:  EOMI, no exophthalmos ENT: no neck masses, no cervical lymphadenopathy Cardiovascular: Tachycardia, RR, No MRG Respiratory: CTA B Musculoskeletal: no deformities Skin:no rashes Neurological: no tremor with outstretched hands  Assessment: 1. Hypercalcemia/hyperparathyroidism  2.  History of vitamin D deficiency  3.  Osteoporosis  Plan: And 3.  Patient with history of elevated calcium, with the highest level being at 10.7, slightly above the upper limit of the normal range and an intact PTH that was quite high, with the highest level being in the 300s..  She does have a history of vitamin deficiency, but latest levels have been normal, though. -Due to age and the mild hypercalcemia, we are managing her with Sensipar.  It is expected that the PTH levels are higher on Sensipar.  Latest calcium level was normal, though, 3 months ago, at 10.4.  She would like to have this repeated today.  Will check this along with a vitamin D level today -She does have osteoporosis but no nephrolithiasis, abdominal pain, bone pain.  She is wondering whether increase urination is related to hypercalcemia but I explained that this is unlikely, since she is on Lasix (her hypercalcemia is very mild -After last visit, we obtain another bone density scan and this showed significant osteoporosis. -I suggested Evenity but the co-pay was too high.  She also could not afford Prolia.  Currently, she is  not on osteoporosis medication.  At today's visit, she tells me she cannot afford co-pays as she is on a very limited income.  I did suggest Reclast but I am not sure whether this would be free for her.  We discussed about possible side effects to include osteonecrosis of the jaw and atypical fractures.  She is not comfortable with the side effects but I explained that the benefit risk ratio is extremely beneficial especially with such a high  risk of fracture.  I gave her information about Reclast and she will review it and let me know if she wanted to proceed with it. -Plan to repeat another bone density scan after next visit - RTC in 1 year  2.  History of vitamin D deficiency -She continues on vitamin D supplement 1000 units daily -Level was normal at last check in 07/2023 -We will repeat this today   Component     Latest Ref Rng 03/08/2023  VITD     30.00 - 100.00 ng/mL 49.71   Sodium     135 - 145 mEq/L 137   Potassium     3.5 - 5.1 mEq/L 4.2   Chloride     96 - 112 mEq/L 96   CO2     19 - 32 mEq/L 31   Glucose     70 - 99 mg/dL 86   BUN     6 - 23 mg/dL 19   Creatinine     1.61 - 1.20 mg/dL 0.96   Calcium     8.4 - 10.5 mg/dL 04.5   GFR     >40.98 mL/min 63.00   Labs are normal.  Carlus Pavlov, MD PhD [facility] Endocrin[facility]

## 2023-03-08 NOTE — Patient Instructions (Addendum)
Please continue: - Vitamin D 1000 units daily - Sensipar 30 mg daily   We will try to start Reclast.  Please stop at the lab.  Please schedule another appointment in 1 year.  Zoledronic acid: Patient drug information (Up-to-Date) Copyright 8013277781 [employer] All rights reserved.  Brand Names: [place]  Reclast;  Zometa What is this drug used for?  It is used to treat high calcium levels.  It is used when treating some cancers.  It is used to treat Paget's disease.  It is used to put off or treat soft, brittle bones (osteoporosis).  It may be given to you for other reasons. Talk with the doctor. What do I need to tell my doctor BEFORE I take this drug?  All products:  If you have an allergy to zoledronic acid or any other part of this drug.  If you are allergic to any drugs like this one, any other drugs, foods, or other substances. Tell your doctor about the allergy and what signs you had, like rash; hives; itching; shortness of breath; wheezing; cough; swelling of face, lips, tongue, or throat; or any other signs.  Reclast:  If you have low calcium levels.  If you have very bad kidney disease.  This is not a list of all drugs or health problems that interact with this drug.  Tell your doctor and pharmacist about all of your drugs (prescription or OTC, natural products, vitamins) and health problems. You must check to make sure that it is safe for you to take this drug with all of your drugs and health problems. Do not start, stop, or change the dose of any drug without checking with your doctor. What are some things I need to know or do while I take this drug?  All products:  Tell dentists, surgeons, and other doctors that you use this drug.  Worsening of asthma has happened in people taking drugs like this one. Talk with your doctor.  This drug may raise the chance of a broken leg. Talk with your doctor.  Have your blood work checked often. Talk with your doctor.  Have a bone  density test. Talk with your doctor.  Have a dental exam before starting this drug.  Take good care of your teeth. See a dentist often.  Do not give to a child. Talk with your doctor.  If you are 61 or older, use this drug with care. You could have more side effects.  This drug may cause harm to the unborn baby if you take it while you are pregnant.  Tell your doctor if you are pregnant or plan on getting pregnant. You will need to talk about the benefits and risks of using this drug while you are pregnant.  Tell your doctor if you are breast-feeding. You will need to talk about any risks to your baby.  Zometa:  Take calcium and vitamin D as you were told by your doctor.  Reclast:  This drug works best when used with calcium/vitamin D and weight-bearing workouts like walking or PT (physical therapy).  Follow the diet and workout plan that your doctor told you about.  Use birth control that you can trust to prevent pregnancy while taking this drug. What are some side effects that I need to call my doctor about right away?  WARNING/CAUTION: Even though it may be rare, some people may have very bad and sometimes deadly side effects when taking a drug. Tell your doctor or get medical help  right away if you have any of the following signs or symptoms that may be related to a very bad side effect:  Signs of an allergic reaction, like rash; hives; itching; red, swollen, blistered, or peeling skin with or without fever; wheezing; tightness in the chest or throat; trouble breathing or talking; unusual hoarseness; or swelling of the mouth, face, lips, tongue, or throat.  Signs of low calcium levels like muscle cramps or spasms, numbness and tingling, or seizures.  Signs of kidney problems like unable to pass urine, change in the amount of urine passed, blood in the urine, or a big weight gain.  Very bad bone, joint, or muscle pain.  Any new or strange groin, hip, or thigh pain.  Chest pain.  A  heartbeat that does not feel normal.  Slow heartbeat.  Change in eyesight.  Eye pain.  Mouth sores.  Trouble swallowing.  Very bad pain when swallowing.  Any bruising or bleeding.  Pain where the shot was given.  Redness or swelling where the shot is given.  This drug may cause jawbone problems. The chance may be higher the longer you take this drug. The chance may be higher if you have cancer, dental problems, dentures that do not fit well, anemia, blood clotting problems, or an infection. The chance may also be higher if you are having dental work or if you are getting chemo, some steroid drugs, or radiation. Call your doctor right away if you have jaw swelling or pain. What are some other side effects of this drug?  All drugs may cause side effects. However, many people have no side effects or only have minor side effects. Call your doctor or get medical help if any of these side effects or any other side effects bother you or do not go away:  All products:  Dizziness.  Upset stomach or throwing up.  Irritation where the shot is given.  Feeling tired or weak.  Belly pain.  Headache.  Flu-like signs.  Loose stools (diarrhea).  Muscle or joint pain.  Back pain.  Zometa:  Not able to sleep.  Not hungry.  Hard stools (constipation).  Weight loss.  Cough.  These are not all of the side effects that may occur. If you have questions about side effects, call your doctor. Call your doctor for medical advice about side effects.  You may report side effects to your national health agency. How is this drug best taken?  Use this drug as ordered by your doctor. Read and follow the dosing on the label closely.  All products:  It is given as a shot into a vein over a period of time.  Drink lots of noncaffeine liquids unless told to drink less liquid by your doctor.  Reclast:  Acetaminophen may be given to lower fever and chills.  Drink at least 2 glasses of liquids a few hours before you  get this drug. What do I do if I miss a dose?  Call the doctor to find out what to do. How do I store and/or throw out this drug?  This drug will be given to you in a hospital or doctor's office. You will not store it at home.  Keep all drugs out of the reach of children and pets.  Check with your pharmacist about how to throw out unused drugs.  General drug facts  If your symptoms or health problems do not get better or if they become worse, call your doctor.  Do not  share your drugs with others and do not take anyone else's drugs.  Keep a list of all your drugs (prescription, natural products, vitamins, OTC) with you. Give this list to your doctor.  Talk with the doctor before starting any new drug, including prescription or OTC, natural products, or vitamins.  Some drugs may have another patient information leaflet. If you have any questions about this drug, please talk with your doctor, pharmacist, or other health care provider.  If you think there has been an overdose, call your poison control center or get medical care right away. Be ready to tell or show what was taken, how much, and when it happened.

## 2023-03-12 ENCOUNTER — Telehealth: Payer: Self-pay

## 2023-03-12 NOTE — Telephone Encounter (Signed)
Pt called to advise she does not want to start Reclast.

## 2023-03-13 NOTE — Telephone Encounter (Signed)
Continue to try an oral medication, for example Fosamax by mouth (in the same class as Reclast) 70 mg once a week?  I am not sure of the co-pay but we can try to send it to the pharmacy if she agrees.

## 2023-03-15 DIAGNOSIS — T679XXA Effect of heat and light, unspecified, initial encounter: Secondary | ICD-10-CM | POA: Diagnosis not present

## 2023-03-15 DIAGNOSIS — R531 Weakness: Secondary | ICD-10-CM | POA: Diagnosis not present

## 2023-03-29 ENCOUNTER — Observation Stay (HOSPITAL_COMMUNITY)
Admission: EM | Admit: 2023-03-29 | Discharge: 2023-03-31 | Disposition: A | Discharge status: Home / Self Care | Payer: Medicare HMO | Attending: Internal Medicine | Admitting: Internal Medicine

## 2023-03-29 ENCOUNTER — Emergency Department (HOSPITAL_COMMUNITY): Payer: Medicare HMO

## 2023-03-29 ENCOUNTER — Other Ambulatory Visit: Payer: Self-pay

## 2023-03-29 ENCOUNTER — Encounter (HOSPITAL_COMMUNITY): Payer: Self-pay | Admitting: Emergency Medicine

## 2023-03-29 DIAGNOSIS — I959 Hypotension, unspecified: Secondary | ICD-10-CM | POA: Diagnosis not present

## 2023-03-29 DIAGNOSIS — I480 Paroxysmal atrial fibrillation: Secondary | ICD-10-CM | POA: Diagnosis not present

## 2023-03-29 DIAGNOSIS — J9611 Chronic respiratory failure with hypoxia: Secondary | ICD-10-CM | POA: Diagnosis not present

## 2023-03-29 DIAGNOSIS — I5032 Chronic diastolic (congestive) heart failure: Secondary | ICD-10-CM | POA: Insufficient documentation

## 2023-03-29 DIAGNOSIS — Z79899 Other long term (current) drug therapy: Secondary | ICD-10-CM | POA: Insufficient documentation

## 2023-03-29 DIAGNOSIS — R42 Dizziness and giddiness: Secondary | ICD-10-CM | POA: Diagnosis not present

## 2023-03-29 DIAGNOSIS — E876 Hypokalemia: Secondary | ICD-10-CM | POA: Diagnosis not present

## 2023-03-29 DIAGNOSIS — K529 Noninfective gastroenteritis and colitis, unspecified: Secondary | ICD-10-CM | POA: Diagnosis not present

## 2023-03-29 DIAGNOSIS — Z86718 Personal history of other venous thrombosis and embolism: Secondary | ICD-10-CM | POA: Diagnosis not present

## 2023-03-29 DIAGNOSIS — Z7901 Long term (current) use of anticoagulants: Secondary | ICD-10-CM | POA: Diagnosis not present

## 2023-03-29 DIAGNOSIS — J432 Centrilobular emphysema: Secondary | ICD-10-CM

## 2023-03-29 DIAGNOSIS — Z87891 Personal history of nicotine dependence: Secondary | ICD-10-CM | POA: Insufficient documentation

## 2023-03-29 DIAGNOSIS — M6281 Muscle weakness (generalized): Secondary | ICD-10-CM | POA: Insufficient documentation

## 2023-03-29 DIAGNOSIS — R55 Syncope and collapse: Secondary | ICD-10-CM | POA: Diagnosis not present

## 2023-03-29 DIAGNOSIS — R109 Unspecified abdominal pain: Secondary | ICD-10-CM | POA: Diagnosis not present

## 2023-03-29 DIAGNOSIS — R2689 Other abnormalities of gait and mobility: Secondary | ICD-10-CM | POA: Diagnosis not present

## 2023-03-29 DIAGNOSIS — I4891 Unspecified atrial fibrillation: Secondary | ICD-10-CM | POA: Diagnosis not present

## 2023-03-29 DIAGNOSIS — R110 Nausea: Secondary | ICD-10-CM | POA: Diagnosis not present

## 2023-03-29 DIAGNOSIS — I700 Atherosclerosis of aorta: Secondary | ICD-10-CM | POA: Diagnosis not present

## 2023-03-29 DIAGNOSIS — R1084 Generalized abdominal pain: Secondary | ICD-10-CM | POA: Diagnosis not present

## 2023-03-29 LAB — URINALYSIS, ROUTINE W REFLEX MICROSCOPIC
Bilirubin Urine: NEGATIVE
Glucose, UA: NEGATIVE mg/dL
Hgb urine dipstick: NEGATIVE
Ketones, ur: NEGATIVE mg/dL
Leukocytes,Ua: NEGATIVE
Nitrite: NEGATIVE
Protein, ur: NEGATIVE mg/dL
Specific Gravity, Urine: 1.012 (ref 1.005–1.030)
pH: 6 (ref 5.0–8.0)

## 2023-03-29 LAB — HEPATIC FUNCTION PANEL
ALT: 11 U/L (ref 0–44)
AST: 23 U/L (ref 15–41)
Albumin: 3.1 g/dL — ABNORMAL LOW (ref 3.5–5.0)
Alkaline Phosphatase: 79 U/L (ref 38–126)
Bilirubin, Direct: 0.3 mg/dL — ABNORMAL HIGH (ref 0.0–0.2)
Indirect Bilirubin: 0.3 mg/dL (ref 0.3–0.9)
Total Bilirubin: 0.6 mg/dL (ref 0.3–1.2)
Total Protein: 4.8 g/dL — ABNORMAL LOW (ref 6.5–8.1)

## 2023-03-29 LAB — CBC
HCT: 38.4 % (ref 36.0–46.0)
Hemoglobin: 12.3 g/dL (ref 12.0–15.0)
MCH: 30.8 pg (ref 26.0–34.0)
MCHC: 32 g/dL (ref 30.0–36.0)
MCV: 96.2 fL (ref 80.0–100.0)
Platelets: 186 10*3/uL (ref 150–400)
RBC: 3.99 MIL/uL (ref 3.87–5.11)
RDW: 13.2 % (ref 11.5–15.5)
WBC: 4.9 10*3/uL (ref 4.0–10.5)
nRBC: 0 % (ref 0.0–0.2)

## 2023-03-29 LAB — BASIC METABOLIC PANEL
Anion gap: 9 (ref 5–15)
BUN: 18 mg/dL (ref 8–23)
CO2: 21 mmol/L — ABNORMAL LOW (ref 22–32)
Calcium: 7.7 mg/dL — ABNORMAL LOW (ref 8.9–10.3)
Chloride: 105 mmol/L (ref 98–111)
Creatinine, Ser: 0.64 mg/dL (ref 0.44–1.00)
GFR, Estimated: >60 mL/min (ref >60)
Glucose, Bld: 105 mg/dL — ABNORMAL HIGH (ref 70–99)
Potassium: 3.2 mmol/L — ABNORMAL LOW (ref 3.5–5.1)
Sodium: 135 mmol/L (ref 135–145)

## 2023-03-29 LAB — TROPONIN I (HIGH SENSITIVITY)
Troponin I (High Sensitivity): 10 ng/L (ref <18)
Troponin I (High Sensitivity): 9 ng/L (ref <18)

## 2023-03-29 LAB — LIPASE, BLOOD: Lipase: 25 U/L (ref 11–51)

## 2023-03-29 MED ORDER — ONDANSETRON HCL 4 MG/2ML IJ SOLN
4.0000 mg | Freq: Once | INTRAMUSCULAR | Status: AC
  Administered 2023-03-29: 4 mg via INTRAVENOUS
  Filled 2023-03-29: qty 2

## 2023-03-29 MED ORDER — SODIUM CHLORIDE 0.9 % IV SOLN
1.0000 g | Freq: Once | INTRAVENOUS | Status: AC
  Administered 2023-03-29: 1 g via INTRAVENOUS
  Filled 2023-03-29: qty 10

## 2023-03-29 MED ORDER — IOHEXOL 350 MG/ML SOLN
75.0000 mL | Freq: Once | INTRAVENOUS | Status: AC | PRN
  Administered 2023-03-29: 75 mL via INTRAVENOUS

## 2023-03-29 MED ORDER — METRONIDAZOLE 500 MG/100ML IV SOLN
500.0000 mg | Freq: Once | INTRAVENOUS | Status: AC
  Administered 2023-03-29: 500 mg via INTRAVENOUS
  Filled 2023-03-29: qty 100

## 2023-03-29 MED ORDER — POTASSIUM CHLORIDE CRYS ER 20 MEQ PO TBCR
40.0000 meq | EXTENDED_RELEASE_TABLET | Freq: Once | ORAL | Status: AC
  Administered 2023-03-29: 40 meq via ORAL
  Filled 2023-03-29: qty 2

## 2023-03-29 MED ORDER — SODIUM CHLORIDE 0.9 % IV SOLN
1000.0000 mL | INTRAVENOUS | Status: DC
  Administered 2023-03-29: 1000 mL via INTRAVENOUS

## 2023-03-29 MED ORDER — SODIUM CHLORIDE 0.9 % IV BOLUS (SEPSIS)
500.0000 mL | Freq: Once | INTRAVENOUS | Status: AC
  Administered 2023-03-29: 500 mL via INTRAVENOUS

## 2023-03-29 NOTE — H&P (Incomplete)
History and Physical  Annette [name] ZOX:[id number] DOB: 07-11-1930 DOA: 03/29/2023  Referring physician: Dr. Lynelle Doctor, EDP  PCP: [place], [name], MD  Outpatient Specialists: Cardiology. Patient coming from: Home, lives with her son   Chief Complaint: Possible loss of consciousness  HPI: Annette [name] is a 89 y.o. female with medical history significant for prior episode of syncope, chronic hypoxemic respiratory failure on 2 L nasal cannula continuously, HFpEF, paroxysmal A-fib on Eliquis, chronic pulmonary embolism, hypertension, GERD, history of pulmonary nodules, primary hyperparathyroidism, who presented to [facility] ED via EMS from home due to possible loss of consciousness while sitting, the patient is unsure.  Denies falls.  Felt nauseated today after eating hot dogs.  Associated with diffuse abdominal discomfort.  Her blood pressure was low.  EMS was activated.  She was brought into the ED for further evaluation.  In the ED, the patient was normotensive.  Complaints of abdomen pain for which she had a CT abdomen and pelvis done.  It revealed concern for colitis.  The patient received Rocephin and IV Flagyl, IV fluid hydration.  EDP requesting admission for further evaluation of syncope and of colitis.  ED Course: Temperature 97.9.  BP 180/96, pulse 85, respiratory 17, saturation 99% on room air.  Lab studies remarkable for serum potassium 3.2, serum bicarb 21, glucose 105.  CBC essentially unremarkable.  Review of Systems: Review of systems as noted in the HPI. All other systems reviewed and are negative.   Past Medical History:  Diagnosis Date   DVT (deep venous thrombosis) (HCC)    GERD 10/03/2007   HYPERTENSION 10/03/2007   OSTEOPENIA 02/03/2008   Primary hyperparathyroidism (HCC) 10/03/2007   Past Surgical History:  Procedure Laterality Date   ABDOMINAL HYSTERECTOMY      Social History:  reports that she quit smoking about 44 years ago. Her smoking use included cigarettes.  She has never used smokeless tobacco. She reports that she does not drink alcohol and does not use drugs.   Allergies  Allergen Reactions   Codeine Nausea And Vomiting    Family History  Problem Relation Age of Onset   Hypercalcemia Neg Hx       Prior to Admission medications   Medication Sig Start Date End Date Taking? Authorizing Provider  acetaminophen (TYLENOL) 500 MG tablet Take 500 mg by mouth 3 (three) times daily.    [provider]  albuterol (VENTOLIN HFA) 108 (90 Base) MCG/ACT inhaler Inhale 2 puffs every 4-6 hours as needed for cough 11/30/22   Karie Georges, MD  apixaban (ELIQUIS) 5 MG TABS tablet Take 1 tablet (5 mg total) by mouth 2 (two) times daily. 03/24/21   Little Ishikawa, MD  cinacalcet (SENSIPAR) 30 MG tablet Take 1 tablet (30 mg total) by mouth daily. 11/16/22   Carlus Pavlov, MD  furosemide (LASIX) 40 MG tablet TAKE 1 TABLET EVERY DAY 02/11/23   Little Ishikawa, MD  latanoprost (XALATAN) 0.005 % ophthalmic solution INSTILL 1 DROP INTO BOTH EYES AT BEDTIME Patient taking differently: Place 1 drop into both eyes at bedtime. 10/29/17   Gordy Savers, MD  magnesium oxide (MAG-OX) 400 (240 Mg) MG tablet TAKE 1 TABLET EVERY DAY 10/29/22   Little Ishikawa, MD  omeprazole (PRILOSEC) 40 MG capsule TAKE 1 CAPSULE EVERY DAY 10/15/22   [place], [name], MD  OXYGEN Inhale into the lungs.    [provider]  pravastatin (PRAVACHOL) 20 MG tablet Take 1 tablet (20 mg total) by mouth  daily. 08/28/22   [place], [name], MD  vitamin B-12 (CYANOCOBALAMIN) 1000 MCG tablet Take 1,000 mcg by mouth 2 (two) times a week. Tuesday and thursday    [provider]  VITAMIN D PO Take 1,000 Units by mouth at bedtime.    [provider]    Physical Exam: BP 112/65   Pulse 83   Temp (!) 97.5 F (36.4 C) (Oral)   Resp 15   SpO2 100%   General: 89 y.o. year-old female well developed well nourished in no acute distress.   Alert and oriented x3. Cardiovascular: Regular rate and rhythm with no rubs or gallops.  No thyromegaly or JVD noted.  No lower extremity edema. 2/4 pulses in all 4 extremities. Respiratory: Clear to auscultation with no wheezes or rales. Good inspiratory effort. Abdomen: Soft diffuse tenderness with palpation.  Nondistended with normal bowel sounds x4 quadrants. Muskuloskeletal: No cyanosis, clubbing or edema noted bilaterally Neuro: CN II-XII intact, strength, sensation, reflexes Skin: No ulcerative lesions noted or rashes Psychiatry: Judgement and insight appear normal. Mood is appropriate for condition and setting          Labs on Admission:  Basic Metabolic Panel: Recent Labs  Lab 03/29/23 1655  NA 135  K 3.2*  CL 105  CO2 21*  GLUCOSE 105*  BUN 18  CREATININE 0.64  CALCIUM 7.7*   Liver Function Tests: Recent Labs  Lab 03/29/23 1619  AST 23  ALT 11  ALKPHOS 79  BILITOT 0.6  PROT 4.8*  ALBUMIN 3.1*   Recent Labs  Lab 03/29/23 1619  LIPASE 25   No results for input(s): "AMMONIA" in the last 168 hours. CBC: Recent Labs  Lab 03/29/23 1551  WBC 4.9  HGB 12.3  HCT 38.4  MCV 96.2  PLT 186   Cardiac Enzymes: No results for input(s): "CKTOTAL", "CKMB", "CKMBINDEX", "TROPONINI" in the last 168 hours.  BNP (last 3 results) No results for input(s): "BNP" in the last 8760 hours.  ProBNP (last 3 results) No results for input(s): "PROBNP" in the last 8760 hours.  CBG: No results for input(s): "GLUCAP" in the last 168 hours.  Radiological Exams on Admission: CT ABDOMEN PELVIS W CONTRAST  Result Date: 03/29/2023 CLINICAL DATA:  Syncope, nausea, dizziness, abdominal pain EXAM: CT ABDOMEN AND PELVIS WITH CONTRAST TECHNIQUE: Multidetector CT imaging of the abdomen and pelvis was performed using the standard protocol following bolus administration of intravenous contrast. RADIATION DOSE REDUCTION: This exam was performed according to the departmental  dose-optimization program which includes automated exposure control, adjustment of the mA and/or kV according to patient size and/or use of iterative reconstruction technique. CONTRAST:  75mL OMNIPAQUE IOHEXOL 350 MG/ML SOLN COMPARISON:  Abdominal radiographs 03/29/2023 FINDINGS: Lower chest: No acute abnormality. Hepatobiliary: No focal hepatic lesion. Mild periportal edema. Hyperdensity within the gallbladder may be due to sludge. No biliary dilation. Pancreas: No acute abnormality. Spleen: Unremarkable. Adrenals/Urinary Tract: Normal adrenal glands. No urinary calculi or hydronephrosis. Unremarkable bladder. Stomach/Bowel: Normal caliber large and small bowel. Extensive sigmoid diverticulosis. Mild wall thickening and mucosal hyperenhancement about the cecum and ascending colon. Moderate stool in the cecum. Fecalization of the terminal ileum. Mild wall thickening versus underdistention of the gastric antrum. Vascular/Lymphatic: Aortic atherosclerosis. No enlarged abdominal or pelvic lymph nodes. Reproductive: Hysterectomy. Other: Mesenteric and body wall edema. Musculoskeletal: Thoracolumbar spondylosis.  No acute abnormality. IMPRESSION: 1. Mild wall thickening and mucosal hyperenhancement about the cecum and ascending colon compatible with colitis. 2. Mild gastritis versus underdistention of the gastric  antrum. 3. Large amount stool in the cecum with terminal fecalization of the terminal ileum may be due to constipation/slow transit. 4. Extensive sigmoid diverticulosis. 5. Periportal, mesenteric, and body wall edema. Correlate with fluid status. Aortic Atherosclerosis (ICD10-I70.0). Electronically Signed   By: Minerva Fester M.D.   On: 03/29/2023 20:07   DG Abdomen Acute W/Chest  Result Date: 03/29/2023 CLINICAL DATA:  Syncope nausea EXAM: DG ABDOMEN ACUTE WITH 1 VIEW CHEST COMPARISON:  12/19/2021 FINDINGS: Single view chest demonstrates cardiomegaly with aortic atherosclerosis. No acute airspace disease  or pleural effusion. No pneumothorax. Supine and upright views of the abdomen demonstrate no free air beneath the diaphragm. Nonobstructed gas pattern with average stool burden. No radiopaque calculi IMPRESSION: Negative abdominal radiographs. No acute cardiopulmonary disease. Cardiomegaly. Electronically Signed   By: Jasmine Pang M.D.   On: 03/29/2023 17:22    EKG: I independently viewed the EKG done and my findings are as followed: Sinus rhythm rate of 62.  Nonspecific ST-T changes.  QTc 412.  Assessment/Plan Present on Admission:  Syncope  Principal Problem:   Syncope  Syncope Reportedly she passed out however, unsure of complete loss of consciousness Obtain orthostatic vital signs Obtain 2D echo to look for any new structural cardiac abnormality PT OT assessment Fall precautions.  Colitis, seen on CT scan Currently afebrile with no leukocytosis Received Rocephin and IV Flagyl in the ED Hold off on antibiotics for now Pain control in place as needed  Paroxysmal A-fib on Eliquis Resume home Eliquis Currently rate controlled Monitor on telemetry  GERD Resume home Protonix  Hyperlipidemia Resume home Pravachol  Chronic hypoxic respiratory failure On 2 L nasal cannula, at baseline.  Ambulatory dysfunction Uses a walker at baseline Fall precautions     DVT prophylaxis: Home Eliquis  Code Status: Full code, stated by the patient herself, she is aware of her previous DNR status.  Family Communication: None at bedside.  Disposition Plan: Admitted to telemetry medical unit  Consults called: None.  Admission status: Observation status.   Status is: Observation    Darlin Drop MD [facility] Pager [phone]  If 7PM-7AM, please contact night-coverage [email or web address] Password [email or web address]  03/29/2023, 10:32 PM

## 2023-03-29 NOTE — ED Notes (Signed)
ED TO INPATIENT HANDOFF REPORT  ED Nurse Name and Phone #: [phone]  S Name/Age/Gender Annette [name] 89 y.o. female Room/Bed: 009C/009C  Code Status   Code Status: Full Code  Home/SNF/Other Home Patient oriented to: self, place, time, and situation Is this baseline? Yes   Triage Complete: Triage complete  Chief Complaint Syncope [R55]  Triage Note Pt from home via [facility] with reports of syncope while sitting. Pt also reports nausea and dizziness. Denies pain.    Allergies Allergies  Allergen Reactions   Codeine Nausea And Vomiting    Level of Care/Admitting Diagnosis ED Disposition     ED Disposition  Admit   Condition  --   Comment  Hospital Area: [facility] [[id number]]  Level of Care: Telemetry Medical [104]  May place patient in observation at [facility] or [facility] if equivalent level of care is available:: No  Covid Evaluation: Asymptomatic - no recent exposure (last 10 days) testing not required  Diagnosis: Syncope [206001]  Admitting Physician: Darlin Drop [[id number]]  Attending Physician: Darlin Drop [[id number]]          B Medical/Surgery History Past Medical History:  Diagnosis Date   DVT (deep venous thrombosis) (HCC)    GERD 10/03/2007   HYPERTENSION 10/03/2007   OSTEOPENIA 02/03/2008   Primary hyperparathyroidism (HCC) 10/03/2007   Past Surgical History:  Procedure Laterality Date   ABDOMINAL HYSTERECTOMY       A IV Location/Drains/Wounds Patient Lines/Drains/Airways Status     Active Line/Drains/Airways     Name Placement date Placement time Site Days   Peripheral IV 03/29/23 20 G Anterior;Left Wrist 03/29/23  1624  Wrist  less than 1            Intake/Output Last 24 hours No intake or output data in the 24 hours ending 03/29/23 2351  Labs/Imaging Results for orders placed or performed during the hospital encounter of 03/29/23 (from the past 48 hour(s))  CBC     Status: None   Collection Time:  03/29/23  3:51 PM  Result Value Ref Range   WBC 4.9 4.0 - 10.5 K/uL   RBC 3.99 3.87 - 5.11 MIL/uL   Hemoglobin 12.3 12.0 - 15.0 g/dL   HCT 78.2 95.6 - 21.3 %   MCV 96.2 80.0 - 100.0 fL   MCH 30.8 26.0 - 34.0 pg   MCHC 32.0 30.0 - 36.0 g/dL   RDW 08.6 57.8 - 46.9 %   Platelets 186 150 - 400 K/uL   nRBC 0.0 0.0 - 0.2 %    Comment: Performed at [facility], [address] [place], [place] [address]  Troponin I (High Sensitivity)     Status: None   Collection Time: 03/29/23  3:51 PM  Result Value Ref Range   Troponin I (High Sensitivity) 10 <18 ng/L    Comment: (NOTE) Elevated high sensitivity troponin I (hsTnI) values and significant  changes across serial measurements may suggest ACS but many other  chronic and acute conditions are known to elevate hsTnI results.  Refer to the "Links" section for chest pain algorithms and additional  guidance. Performed at [facility], [address] [place], [place] [address]   Hepatic function panel     Status: Abnormal   Collection Time: 03/29/23  4:19 PM  Result Value Ref Range   Total Protein 4.8 (L) 6.5 - 8.1 g/dL   Albumin 3.1 (L) 3.5 - 5.0 g/dL   AST 23 15 -  41 U/L    Comment: HEMOLYSIS AT THIS LEVEL MAY AFFECT RESULT   ALT 11 0 - 44 U/L    Comment: HEMOLYSIS AT THIS LEVEL MAY AFFECT RESULT   Alkaline Phosphatase 79 38 - 126 U/L   Total Bilirubin 0.6 0.3 - 1.2 mg/dL    Comment: HEMOLYSIS AT THIS LEVEL MAY AFFECT RESULT   Bilirubin, Direct 0.3 (H) 0.0 - 0.2 mg/dL   Indirect Bilirubin 0.3 0.3 - 0.9 mg/dL    Comment: Performed at [facility], [address], [place], [place] [address]  Lipase, blood     Status: None   Collection Time: 03/29/23  4:19 PM  Result Value Ref Range   Lipase 25 11 - 51 U/L    Comment: Performed at [facility], [address] [place], [place] [address]  Basic metabolic panel     Status: Abnormal   Collection Time: 03/29/23  4:55 PM  Result Value Ref Range   Sodium 135 135 -  145 mmol/L   Potassium 3.2 (L) 3.5 - 5.1 mmol/L   Chloride 105 98 - 111 mmol/L   CO2 21 (L) 22 - 32 mmol/L   Glucose, Bld 105 (H) 70 - 99 mg/dL    Comment: Glucose reference range applies only to samples taken after fasting for at least 8 hours.   BUN 18 8 - 23 mg/dL   Creatinine, Ser 0.98 0.44 - 1.00 mg/dL   Calcium 7.7 (L) 8.9 - 10.3 mg/dL   GFR, Estimated >11 >91 mL/min    Comment: (NOTE) Calculated using the CKD-EPI Creatinine Equation (2021)    Anion gap 9 5 - 15    Comment: Performed at [facility], [address], [place], [place] [address]  Troponin I (High Sensitivity)     Status: None   Collection Time: 03/29/23  4:55 PM  Result Value Ref Range   Troponin I (High Sensitivity) 9 <18 ng/L    Comment: (NOTE) Elevated high sensitivity troponin I (hsTnI) values and significant  changes across serial measurements may suggest ACS but many other  chronic and acute conditions are known to elevate hsTnI results.  Refer to the "Links" section for chest pain algorithms and additional  guidance. Performed at [facility], [address] [place], [place] [address]   Urinalysis, Routine w reflex microscopic -Urine, Clean Catch     Status: Abnormal   Collection Time: 03/29/23  5:59 PM  Result Value Ref Range   Color, Urine YELLOW YELLOW   APPearance HAZY (A) CLEAR   Specific Gravity, Urine 1.012 1.005 - 1.030   pH 6.0 5.0 - 8.0   Glucose, UA NEGATIVE NEGATIVE mg/dL   Hgb urine dipstick NEGATIVE NEGATIVE   Bilirubin Urine NEGATIVE NEGATIVE   Ketones, ur NEGATIVE NEGATIVE mg/dL   Protein, ur NEGATIVE NEGATIVE mg/dL   Nitrite NEGATIVE NEGATIVE   Leukocytes,Ua NEGATIVE NEGATIVE    Comment: Performed at [facility], [address], [place], [place] [address]   CT ABDOMEN PELVIS W CONTRAST  Result Date: 03/29/2023 CLINICAL DATA:  Syncope, nausea, dizziness, abdominal pain EXAM: CT ABDOMEN AND PELVIS WITH CONTRAST TECHNIQUE: Multidetector CT imaging of the  abdomen and pelvis was performed using the standard protocol following bolus administration of intravenous contrast. RADIATION DOSE REDUCTION: This exam was performed according to the departmental dose-optimization program which includes automated exposure control, adjustment of the mA and/or kV according to patient size and/or use of iterative reconstruction technique. CONTRAST:  75mL OMNIPAQUE IOHEXOL 350 MG/ML SOLN COMPARISON:  Abdominal radiographs 03/29/2023 FINDINGS: Lower chest: No acute abnormality. Hepatobiliary: No focal hepatic lesion. Mild periportal edema. Hyperdensity within the gallbladder may be due to sludge. No biliary dilation. Pancreas: No acute abnormality. Spleen: Unremarkable. Adrenals/Urinary Tract: Normal adrenal glands. No urinary calculi or hydronephrosis. Unremarkable bladder. Stomach/Bowel: Normal caliber large and small bowel. Extensive sigmoid diverticulosis. Mild wall thickening and mucosal hyperenhancement about the cecum and ascending colon. Moderate stool in the cecum. Fecalization of the terminal ileum. Mild wall thickening versus underdistention of the gastric antrum. Vascular/Lymphatic: Aortic atherosclerosis. No enlarged abdominal or pelvic lymph nodes. Reproductive: Hysterectomy. Other: Mesenteric and body wall edema. Musculoskeletal: Thoracolumbar spondylosis.  No acute abnormality. IMPRESSION: 1. Mild wall thickening and mucosal hyperenhancement about the cecum and ascending colon compatible with colitis. 2. Mild gastritis versus underdistention of the gastric antrum. 3. Large amount stool in the cecum with terminal fecalization of the terminal ileum may be due to constipation/slow transit. 4. Extensive sigmoid diverticulosis. 5. Periportal, mesenteric, and body wall edema. Correlate with fluid status. Aortic Atherosclerosis (ICD10-I70.0). Electronically Signed   By: Minerva Fester M.D.   On: 03/29/2023 20:07   DG Abdomen Acute W/Chest  Result Date: 03/29/2023 CLINICAL  DATA:  Syncope nausea EXAM: DG ABDOMEN ACUTE WITH 1 VIEW CHEST COMPARISON:  12/19/2021 FINDINGS: Single view chest demonstrates cardiomegaly with aortic atherosclerosis. No acute airspace disease or pleural effusion. No pneumothorax. Supine and upright views of the abdomen demonstrate no free air beneath the diaphragm. Nonobstructed gas pattern with average stool burden. No radiopaque calculi IMPRESSION: Negative abdominal radiographs. No acute cardiopulmonary disease. Cardiomegaly. Electronically Signed   By: Jasmine Pang M.D.   On: 03/29/2023 17:22    Pending Labs Unresulted Labs (From admission, onward)    None       Vitals/Pain Today's Vitals   03/29/23 1727 03/29/23 1800 03/29/23 2130 03/29/23 2307  BP: 99/61 112/65    Pulse: 74 81 83   Resp: 20 19 15    Temp:    97.8 F (36.6 C)  TempSrc:    Oral  SpO2: 100% 99% 100%   PainSc:        Isolation Precautions No active isolations  Medications Medications  sodium chloride 0.9 % bolus 500 mL (0 mLs Intravenous Stopped 03/29/23 1725)    Followed by  0.9 %  sodium chloride infusion (1,000 mLs Intravenous Bolus 03/29/23 1734)  cefTRIAXone (ROCEPHIN) 1 g in sodium chloride 0.9 % 100 mL IVPB (0 g Intravenous Stopped 03/29/23 2230)    And  metroNIDAZOLE (FLAGYL) IVPB 500 mg (500 mg Intravenous New Bag/Given 03/29/23 2307)  ondansetron (ZOFRAN) injection 4 mg (4 mg Intravenous Given 03/29/23 1625)  ondansetron (ZOFRAN) injection 4 mg (4 mg Intravenous Given 03/29/23 1832)  iohexol (OMNIPAQUE) 350 MG/ML injection 75 mL (75 mLs Intravenous Contrast Given 03/29/23 1953)  potassium chloride SA (KLOR-CON M) CR tablet 40 mEq (40 mEq Oral Given 03/29/23 2150)    Mobility walks with person assist     Focused Assessments     R Recommendations: See Admitting Provider Note  Report given to:   Additional Notes:

## 2023-03-29 NOTE — ED Provider Notes (Signed)
[place] EMERGENCY DEPARTMENT AT [facility] Provider Note   CSN: [id number] Arrival date & time: 03/29/23  1541     History  Chief Complaint  Patient presents with   Loss of Consciousness    Annette [name] is a 89 y.o. female.   Loss of Consciousness Associated symptoms: no fever, no headaches and no seizures      Pt states she had a syncopal episode today.  Pt has been nauseated today.  She thinks it could be the hard dogs she ate today.  She ate breakfast this am and felt fine.  She denies any chest pain.  No abd pain.  No diarrhea.    She started to feel lightheaded and took her blood pressure.  Pt states it was low below 100.  She called her son and they called 911.   She still is having waves of  nausea.  She is not feeling lightheaded.  Home Medications Prior to Admission medications   Medication Sig Start Date End Date Taking? Authorizing Provider  acetaminophen (TYLENOL) 500 MG tablet Take 500 mg by mouth 3 (three) times daily.    [provider]  albuterol (VENTOLIN HFA) 108 (90 Base) MCG/ACT inhaler Inhale 2 puffs every 4-6 hours as needed for cough 11/30/22   Karie Georges, MD  apixaban (ELIQUIS) 5 MG TABS tablet Take 1 tablet (5 mg total) by mouth 2 (two) times daily. 03/24/21   Little Ishikawa, MD  cinacalcet (SENSIPAR) 30 MG tablet Take 1 tablet (30 mg total) by mouth daily. 11/16/22   Carlus Pavlov, MD  furosemide (LASIX) 40 MG tablet TAKE 1 TABLET EVERY DAY 02/11/23   Little Ishikawa, MD  latanoprost (XALATAN) 0.005 % ophthalmic solution INSTILL 1 DROP INTO BOTH EYES AT BEDTIME Patient taking differently: Place 1 drop into both eyes at bedtime. 10/29/17   Gordy Savers, MD  magnesium oxide (MAG-OX) 400 (240 Mg) MG tablet TAKE 1 TABLET EVERY DAY 10/29/22   Little Ishikawa, MD  omeprazole (PRILOSEC) 40 MG capsule TAKE 1 CAPSULE EVERY DAY 10/15/22   [place], [name], MD  OXYGEN Inhale into the lungs.    [provider]  pravastatin (PRAVACHOL) 20 MG tablet Take 1 tablet (20 mg total) by mouth daily. 08/28/22   [place], [name], MD  vitamin B-12 (CYANOCOBALAMIN) 1000 MCG tablet Take 1,000 mcg by mouth 2 (two) times a week. Tuesday and thursday    [provider]  VITAMIN D PO Take 1,000 Units by mouth at bedtime.    [provider]      Allergies    Codeine    Review of Systems   Review of Systems  Constitutional:  Negative for fever.  Cardiovascular:  Positive for syncope.  Neurological:  Negative for seizures and headaches.    Physical Exam Updated Vital Signs BP 112/65   Pulse 81   Temp (!) 97.5 F (36.4 C) (Oral)   Resp 19   SpO2 99%  Physical Exam Vitals and nursing note reviewed.  Constitutional:      Appearance: She is well-developed. She is not diaphoretic.     Comments: Elderly frail   HENT:     Head: Normocephalic and atraumatic.     Right Ear: External ear normal.     Left Ear: External ear normal.  Eyes:     General: No scleral icterus.       Right eye: No discharge.  Left eye: No discharge.     Conjunctiva/sclera: Conjunctivae normal.  Neck:     Trachea: No tracheal deviation.  Cardiovascular:     Rate and Rhythm: Normal rate and regular rhythm.  Pulmonary:     Effort: Pulmonary effort is normal. No respiratory distress.     Breath sounds: Normal breath sounds. No stridor. No wheezing or rales.  Abdominal:     General: Bowel sounds are normal. There is no distension.     Palpations: Abdomen is soft.     Tenderness: There is no abdominal tenderness. There is no guarding or rebound.  Musculoskeletal:        General: No tenderness or deformity.     Cervical back: Neck supple.     Right lower leg: Edema present.     Left lower leg: Edema present.  Skin:    General: Skin is warm and dry.     Findings: No rash.  Neurological:     General: No focal deficit present.     Mental Status: She is alert.     Cranial Nerves: No cranial  nerve deficit, dysarthria or facial asymmetry.     Sensory: No sensory deficit.     Motor: No abnormal muscle tone or seizure activity.     Coordination: Coordination normal.     Comments: Equal grip strength, equal lower extrem strength, no facial droop  Psychiatric:        Mood and Affect: Mood normal.     ED Results / Procedures / Treatments   Labs (all labs ordered are listed, but only abnormal results are displayed) Labs Reviewed  URINALYSIS, ROUTINE W REFLEX MICROSCOPIC - Abnormal; Notable for the following components:      Result Value   APPearance HAZY (*)    All other components within normal limits  HEPATIC FUNCTION PANEL - Abnormal; Notable for the following components:   Total Protein 4.8 (*)    Albumin 3.1 (*)    Bilirubin, Direct 0.3 (*)    All other components within normal limits  BASIC METABOLIC PANEL - Abnormal; Notable for the following components:   Potassium 3.2 (*)    CO2 21 (*)    Glucose, Bld 105 (*)    Calcium 7.7 (*)    All other components within normal limits  CBC  LIPASE, BLOOD  TROPONIN I (HIGH SENSITIVITY)  TROPONIN I (HIGH SENSITIVITY)    EKG EKG Interpretation  Date/Time:  Friday Mar 29 2023 15:43:57 EDT Ventricular Rate:  62 PR Interval:    QRS Duration: 68 QT Interval:  406 QTC Calculation: 412 R Axis:   -32 Text Interpretation: a flutter vs atrial fibrillation Left axis deviation Anteroseptal infarct , age undetermined Abnormal ECG When compared with ECG of 04-Mar-2022 13:58,  No significant change since last tracing Confirmed by Linwood Dibbles [phone]) on 03/29/2023 4:20:10 PM  Radiology CT ABDOMEN PELVIS W CONTRAST  Result Date: 03/29/2023 CLINICAL DATA:  Syncope, nausea, dizziness, abdominal pain EXAM: CT ABDOMEN AND PELVIS WITH CONTRAST TECHNIQUE: Multidetector CT imaging of the abdomen and pelvis was performed using the standard protocol following bolus administration of intravenous contrast. RADIATION DOSE REDUCTION: This exam  was performed according to the departmental dose-optimization program which includes automated exposure control, adjustment of the mA and/or kV according to patient size and/or use of iterative reconstruction technique. CONTRAST:  75mL OMNIPAQUE IOHEXOL 350 MG/ML SOLN COMPARISON:  Abdominal radiographs 03/29/2023 FINDINGS: Lower chest: No acute abnormality. Hepatobiliary: No focal hepatic lesion. Mild periportal edema. Hyperdensity within  the gallbladder may be due to sludge. No biliary dilation. Pancreas: No acute abnormality. Spleen: Unremarkable. Adrenals/Urinary Tract: Normal adrenal glands. No urinary calculi or hydronephrosis. Unremarkable bladder. Stomach/Bowel: Normal caliber large and small bowel. Extensive sigmoid diverticulosis. Mild wall thickening and mucosal hyperenhancement about the cecum and ascending colon. Moderate stool in the cecum. Fecalization of the terminal ileum. Mild wall thickening versus underdistention of the gastric antrum. Vascular/Lymphatic: Aortic atherosclerosis. No enlarged abdominal or pelvic lymph nodes. Reproductive: Hysterectomy. Other: Mesenteric and body wall edema. Musculoskeletal: Thoracolumbar spondylosis.  No acute abnormality. IMPRESSION: 1. Mild wall thickening and mucosal hyperenhancement about the cecum and ascending colon compatible with colitis. 2. Mild gastritis versus underdistention of the gastric antrum. 3. Large amount stool in the cecum with terminal fecalization of the terminal ileum may be due to constipation/slow transit. 4. Extensive sigmoid diverticulosis. 5. Periportal, mesenteric, and body wall edema. Correlate with fluid status. Aortic Atherosclerosis (ICD10-I70.0). Electronically Signed   By: Minerva Fester M.D.   On: 03/29/2023 20:07   DG Abdomen Acute W/Chest  Result Date: 03/29/2023 CLINICAL DATA:  Syncope nausea EXAM: DG ABDOMEN ACUTE WITH 1 VIEW CHEST COMPARISON:  12/19/2021 FINDINGS: Single view chest demonstrates cardiomegaly with aortic  atherosclerosis. No acute airspace disease or pleural effusion. No pneumothorax. Supine and upright views of the abdomen demonstrate no free air beneath the diaphragm. Nonobstructed gas pattern with average stool burden. No radiopaque calculi IMPRESSION: Negative abdominal radiographs. No acute cardiopulmonary disease. Cardiomegaly. Electronically Signed   By: Jasmine Pang M.D.   On: 03/29/2023 17:22    Procedures Procedures    Medications Ordered in ED Medications  sodium chloride 0.9 % bolus 500 mL (0 mLs Intravenous Stopped 03/29/23 1725)    Followed by  0.9 %  sodium chloride infusion (1,000 mLs Intravenous Bolus 03/29/23 1734)  cefTRIAXone (ROCEPHIN) 1 g in sodium chloride 0.9 % 100 mL IVPB (has no administration in time range)    And  metroNIDAZOLE (FLAGYL) IVPB 500 mg (has no administration in time range)  ondansetron (ZOFRAN) injection 4 mg (4 mg Intravenous Given 03/29/23 1625)  ondansetron (ZOFRAN) injection 4 mg (4 mg Intravenous Given 03/29/23 1832)  iohexol (OMNIPAQUE) 350 MG/ML injection 75 mL (75 mLs Intravenous Contrast Given 03/29/23 1953)    ED Course/ Medical Decision Making/ A&P Clinical Course as of 03/29/23 2231  Fri Mar 29, 2023  1740 CBC Normal [JK]  1740 Troponin I (High Sensitivity) Normal  [JK]  1802 Acute abdominal series without acute findings [JK]  1825 CBC CBC [JK]  1825 Basic metabolic panel(!) Metabolic panel normal [JK]  1826 Urinalysis, Routine w reflex microscopic -Urine, Clean Catch(!) Urinalysis without signs of infection [JK]  1826 Patient still complaining of nausea.  Will proceed with CT scan to evaluate for any acute abdominal pathology causing her persistent nausea and a syncopal episode [JK]  2045 LFTs unremarkable.  Urinalysis negative. [JK]  2045 CT scan shows mild wall thickening and mucosal hyperenhancement about the cecum and ascending colon compatible with colitis.  Patient also may have mild gastritis.  Patient does have a large  amount of stool in the cecum [JK]  2231 Discussed case with Dr. Margo Aye regarding admission [JK]    Clinical Course User Index [JK] Linwood Dibbles, MD                             Medical Decision Making Problems Addressed: Colitis: acute illness or injury that poses a threat to life or  bodily functions Syncope, unspecified syncope type: acute illness or injury that poses a threat to life or bodily functions  Amount and/or Complexity of Data Reviewed Labs: ordered. Decision-making details documented in ED Course. Radiology: ordered.  Risk Prescription drug management. Decision regarding hospitalization.   Patient presented to the ED for evaluation of a syncopal episode.  Patient states she has been having some trouble with nausea today.  She believes it was related to some recent food she ate, hot dogs.  Patient's vital signs were stable in the ED.  No signs of severe dehydration or anemia.  Potassium slightly decreased but I doubt related to acute symptoms.  Cardiac enzymes reassuring.  No signs of cardiac ischemia.  CT scan was performed because of her persistent nausea.  It does show findings suggestive of possible colitis.  No evidence of ruptured aneurysm, abscess or other surgical abnormality.  Considering her age and her syncopal episode I think it is reasonable to bring her in the hospital for monitoring.  I have ordered IV antibiotics.  I will consult the medical service for admission.        Final Clinical Impression(s) / ED Diagnoses Final diagnoses:  Colitis  Syncope, unspecified syncope type    Rx / DC Orders ED Discharge Orders     None         Linwood Dibbles, MD 03/29/23 2231

## 2023-03-29 NOTE — ED Notes (Signed)
Recollect light green sent

## 2023-03-29 NOTE — ED Triage Notes (Signed)
Pt from home via [facility] with reports of syncope while sitting. Pt also reports nausea and dizziness. Denies pain.

## 2023-03-30 ENCOUNTER — Observation Stay (HOSPITAL_COMMUNITY): Payer: Medicare HMO

## 2023-03-30 ENCOUNTER — Observation Stay (HOSPITAL_BASED_OUTPATIENT_CLINIC_OR_DEPARTMENT_OTHER): Payer: Medicare HMO

## 2023-03-30 DIAGNOSIS — I5032 Chronic diastolic (congestive) heart failure: Secondary | ICD-10-CM | POA: Diagnosis not present

## 2023-03-30 DIAGNOSIS — R569 Unspecified convulsions: Secondary | ICD-10-CM

## 2023-03-30 DIAGNOSIS — K529 Noninfective gastroenteritis and colitis, unspecified: Secondary | ICD-10-CM | POA: Diagnosis not present

## 2023-03-30 DIAGNOSIS — R55 Syncope and collapse: Secondary | ICD-10-CM

## 2023-03-30 DIAGNOSIS — J432 Centrilobular emphysema: Secondary | ICD-10-CM | POA: Diagnosis not present

## 2023-03-30 LAB — BASIC METABOLIC PANEL
Anion gap: 7 (ref 5–15)
BUN: 12 mg/dL (ref 8–23)
CO2: 27 mmol/L (ref 22–32)
Calcium: 9.4 mg/dL (ref 8.9–10.3)
Chloride: 102 mmol/L (ref 98–111)
Creatinine, Ser: 0.71 mg/dL (ref 0.44–1.00)
GFR, Estimated: >60 mL/min (ref >60)
Glucose, Bld: 98 mg/dL (ref 70–99)
Potassium: 4.3 mmol/L (ref 3.5–5.1)
Sodium: 136 mmol/L (ref 135–145)

## 2023-03-30 LAB — MAGNESIUM: Magnesium: 1.8 mg/dL (ref 1.7–2.4)

## 2023-03-30 LAB — ECHOCARDIOGRAM COMPLETE: S' Lateral: 2.8 cm

## 2023-03-30 LAB — CBC
HCT: 38.4 % (ref 36.0–46.0)
Hemoglobin: 12.9 g/dL (ref 12.0–15.0)
MCH: 30.7 pg (ref 26.0–34.0)
MCHC: 33.6 g/dL (ref 30.0–36.0)
MCV: 91.4 fL (ref 80.0–100.0)
Platelets: 165 10*3/uL (ref 150–400)
RBC: 4.2 MIL/uL (ref 3.87–5.11)
RDW: 13.1 % (ref 11.5–15.5)
WBC: 6.9 10*3/uL (ref 4.0–10.5)
nRBC: 0 % (ref 0.0–0.2)

## 2023-03-30 LAB — GLUCOSE, CAPILLARY
Glucose-Capillary: 93 mg/dL (ref 70–99)
Glucose-Capillary: 82 mg/dL (ref 70–99)
Glucose-Capillary: 102 mg/dL — ABNORMAL HIGH (ref 70–99)

## 2023-03-30 LAB — PHOSPHORUS: Phosphorus: 2.8 mg/dL (ref 2.5–4.6)

## 2023-03-30 MED ORDER — PRAVASTATIN SODIUM 40 MG PO TABS
20.0000 mg | ORAL_TABLET | Freq: Every day | ORAL | Status: DC
  Administered 2023-03-30 – 2023-03-31 (×2): 20 mg via ORAL
  Filled 2023-03-30 (×2): qty 1

## 2023-03-30 MED ORDER — POTASSIUM CHLORIDE CRYS ER 20 MEQ PO TBCR
40.0000 meq | EXTENDED_RELEASE_TABLET | Freq: Once | ORAL | Status: AC
  Administered 2023-03-30: 40 meq via ORAL
  Filled 2023-03-30: qty 2

## 2023-03-30 MED ORDER — OXYCODONE HCL 5 MG PO TABS: 5.0000 mg | ORAL_TABLET | Freq: Four times a day (QID) | ORAL | Status: DC | PRN

## 2023-03-30 MED ORDER — VITAMIN B-12 1000 MCG PO TABS: 1000.0000 ug | ORAL_TABLET | ORAL | Status: DC

## 2023-03-30 MED ORDER — PANTOPRAZOLE SODIUM 40 MG PO TBEC
40.0000 mg | DELAYED_RELEASE_TABLET | Freq: Every day | ORAL | Status: DC
  Administered 2023-03-30 – 2023-03-31 (×2): 40 mg via ORAL
  Filled 2023-03-30 (×2): qty 1

## 2023-03-30 MED ORDER — PROCHLORPERAZINE EDISYLATE 10 MG/2ML IJ SOLN: 5.0000 mg | Freq: Four times a day (QID) | INTRAMUSCULAR | Status: DC | PRN

## 2023-03-30 MED ORDER — SODIUM CHLORIDE 0.9 % IV SOLN
1000.0000 mL | INTRAVENOUS | Status: AC
  Administered 2023-03-30: 1000 mL via INTRAVENOUS

## 2023-03-30 MED ORDER — HYDROMORPHONE HCL 1 MG/ML IJ SOLN: 0.5000 mg | INTRAMUSCULAR | Status: DC | PRN

## 2023-03-30 MED ORDER — SODIUM CHLORIDE 0.9 % IV SOLN: INTRAVENOUS | Status: DC

## 2023-03-30 MED ORDER — LATANOPROST 0.005 % OP SOLN
1.0000 [drp] | Freq: Every day | OPHTHALMIC | Status: DC
  Administered 2023-03-30: 1 [drp] via OPHTHALMIC
  Filled 2023-03-30: qty 2.5

## 2023-03-30 MED ORDER — SODIUM CHLORIDE 0.9 % IV SOLN: 1.0000 g | INTRAVENOUS | Status: DC

## 2023-03-30 MED ORDER — METRONIDAZOLE 500 MG/100ML IV SOLN
500.0000 mg | Freq: Two times a day (BID) | INTRAVENOUS | Status: DC
  Administered 2023-03-30 – 2023-03-31 (×2): 500 mg via INTRAVENOUS
  Filled 2023-03-30 (×2): qty 100

## 2023-03-30 MED ORDER — SODIUM CHLORIDE 0.9 % IV SOLN
2.0000 g | INTRAVENOUS | Status: DC
  Administered 2023-03-30: 2 g via INTRAVENOUS
  Filled 2023-03-30: qty 20

## 2023-03-30 MED ORDER — ACETAMINOPHEN 325 MG PO TABS
650.0000 mg | ORAL_TABLET | Freq: Four times a day (QID) | ORAL | Status: DC | PRN
  Administered 2023-03-30 – 2023-03-31 (×2): 650 mg via ORAL
  Filled 2023-03-30 (×2): qty 2

## 2023-03-30 MED ORDER — APIXABAN 5 MG PO TABS
5.0000 mg | ORAL_TABLET | Freq: Two times a day (BID) | ORAL | Status: DC
  Administered 2023-03-30 – 2023-03-31 (×3): 5 mg via ORAL
  Filled 2023-03-30 (×3): qty 1

## 2023-03-30 MED ORDER — VITAMIN D 25 MCG (1000 UNIT) PO TABS
1000.0000 [IU] | ORAL_TABLET | Freq: Every day | ORAL | Status: DC
  Administered 2023-03-30: 1000 [IU] via ORAL
  Filled 2023-03-30: qty 1

## 2023-03-30 NOTE — Care Management Obs Status (Signed)
MEDICARE OBSERVATION STATUS NOTIFICATION   Patient Details  Name: FRANSHESKA LEDBETTER MRN: [id number] Date of Birth: February 19, 1930   Medicare Observation Status Notification Given:  Yes    Lawerance Sabal, RN 03/30/2023, 4:11 PM

## 2023-03-30 NOTE — Procedures (Signed)
Patient Name: Annette [name]  MRN: [id number]  Epilepsy Attending: Charlsie Quest  Referring Physician/Provider: Alwyn Ren, MD  Date: 03/30/2023 Duration: 29.30 mins  Patient history: 89 y.o. female with medical history significant for prior episode of syncope who presented to [facility] ED via EMS from home due to possible loss of consciousness while sitting, the patient is unsure. EEG to evaluate for seizure.   Level of alertness: Awake, asleep  AEDs during EEG study: None  Technical aspects: This EEG study was done with scalp electrodes positioned according to the 10-20 International system of electrode placement. Electrical activity was reviewed with band pass filter of 1-70Hz , sensitivity of 7 uV/mm, display speed of 46mm/sec with a 60Hz  notched filter applied as appropriate. EEG data were recorded continuously and digitally stored.  Video monitoring was available and reviewed as appropriate.  Description: The posterior dominant rhythm consists of 8 Hz activity of moderate voltage (25-35 uV) seen predominantly in posterior head regions, symmetric and reactive to eye opening and eye closing. Sleep was characterized by vertex waves, sleep spindles (12 to 14 Hz), maximal frontocentral region. Hyperventilation and photic stimulation were not performed.     IMPRESSION: This study is within normal limits. No seizures or epileptiform discharges were seen throughout the recording.  A normal interictal EEG does not exclude the diagnosis of epilepsy.  Annette [name]

## 2023-03-30 NOTE — Progress Notes (Signed)
  Echocardiogram 2D Echocardiogram has been performed.  Delcie Roch 03/30/2023, 3:11 PM

## 2023-03-30 NOTE — Evaluation (Signed)
Occupational Therapy Evaluation Patient Details Name: Annette [name] MRN: [id number] DOB: 17-Sep-1930 Today's Date: 03/30/2023   History of Present Illness Annette [name] is a 89 y.o. female who presents with reports of syncope while sitting, nausea and dizziness. CT abdomen and pelvis revealed concern for colitis. PMHx: prior episode of syncope, chronic hypoxemic respiratory failure on 2 L nasal cannula continuously, HFpEF, paroxysmal A-fib on Eliquis, chronic pulmonary embolism, hypertension, GERD, history of pulmonary nodules and primary hyperparathyroidism.   Clinical Impression   Annette [name] was evaluated s/p above admission list. Pt reports mod I for ADLs and functional mobility with use of a rollator at baseline. During session, pt limited secondary to anxiety, decreased activity tolerance, generalized weakness, and decreased balance. Pt currently requires setup A for seated UB ADLs and mod A for LB ADLs. Pt currently requires min guard A for functional transfers and mobility with use of RW. Pt would continue to benefit from acute OT services to maximize safety and functional independence. Pt does not require follow up OT services upon discharge as pt is expected to make good progress acutely.       Recommendations for follow up therapy are one component of a multi-disciplinary discharge planning process, led by the attending physician.  Recommendations may be updated based on patient status, additional functional criteria and insurance authorization.   Assistance Recommended at Discharge Set up Supervision/Assistance  Patient can return home with the following Assist for transportation;Assistance with cooking/housework;A little help with bathing/dressing/bathroom    Functional Status Assessment  Patient has had a recent decline in their functional status and demonstrates the ability to make significant improvements in function in a reasonable and predictable amount of time.  Equipment  Recommendations  None recommended by OT    Recommendations for Other Services       Precautions / Restrictions Precautions Precautions: Fall Precaution Comments: fall Restrictions Weight Bearing Restrictions: No      Mobility Bed Mobility Overal bed mobility: Needs Assistance Bed Mobility: Supine to Sit     Supine to sit: Supervision, HOB elevated     General bed mobility comments: Supervision A for safety    Transfers Overall transfer level: Needs assistance Equipment used: Rolling walker (2 wheels) Transfers: Sit to/from Stand, Bed to chair/wheelchair/BSC Sit to Stand: Min guard     Step pivot transfers: Min guard     General transfer comment: min guard provided for safety      Balance Overall balance assessment: Needs assistance Sitting-balance support: Feet supported, No upper extremity supported Sitting balance-Leahy Scale: Good     Standing balance support: Bilateral upper extremity supported Standing balance-Leahy Scale: Poor                             ADL either performed or assessed with clinical judgement   ADL Overall ADL's : Needs assistance/impaired Eating/Feeding: Set up Eating/Feeding Details (indicate cue type and reason): setup A provided for container management Grooming: Set up   Upper Body Bathing: Set up;Sitting   Lower Body Bathing: Moderate assistance;Sit to/from stand   Upper Body Dressing : Set up;Sitting   Lower Body Dressing: Moderate assistance;Sit to/from stand   Toilet Transfer: Hydrographic surveyor Details (indicate cue type and reason): simulated Toileting- Clothing Manipulation and Hygiene: Moderate assistance;Sit to/from stand       Functional mobility during ADLs: Min guard General ADL Comments: Assist levels given as pt required BUE support on RW for stability in  standing; pt self-distracted and needing constant redirection.     Vision Baseline Vision/History: 3 Glaucoma Ability to See in  Adequate Light: 0 Adequate Patient Visual Report:  (Pt does not wear glasses but reports using magnifying glass to read small print at baseline) Vision Assessment?: Vision impaired- to be further tested in functional context Additional Comments: WFL for tasks assessed     Perception Perception Perception Tested?: No   Praxis Praxis Praxis tested?: Not tested    Pertinent Vitals/Pain Pain Assessment Pain Assessment: Faces Faces Pain Scale: No hurt Pain Intervention(s): Monitored during session     Hand Dominance Left   Extremity/Trunk Assessment Upper Extremity Assessment Upper Extremity Assessment: Defer to OT evaluation   Lower Extremity Assessment Lower Extremity Assessment: Generalized weakness   Cervical / Trunk Assessment Cervical / Trunk Assessment: Kyphotic   Communication Communication Communication: No difficulties   Cognition Arousal/Alertness: Awake/alert Behavior During Therapy: Anxious Overall Cognitive Status: Within Functional Limits for tasks assessed                                 General Comments: Pt anxious and perseverating on oxygen and BP throughout session, required frequent redirection to stay on task.     General Comments  SBP 170s-180s    Exercises     Shoulder Instructions      Home Living Family/patient expects to be discharged to:: Private residence Living Arrangements: Children Available Help at Discharge: Available 24 hours/day (son works during the day, DIL available 24/7) Type of Home: House Home Access: Ramped entrance     Home Layout: One level     Bathroom Shower/Tub: Chief Strategy Officer:  (pt uses BSC at baseline) Bathroom Accessibility: Yes How Accessible: Accessible via walker Home Equipment: Rollator (4 wheels);Shower seat;BSC/3in1;Grab bars - tub/shower   Additional Comments: Pt reports she has not left her house for 4 years except for doctors appointments      Prior  Functioning/Environment Prior Level of Function : Independent/Modified Independent             Mobility Comments: uses rollator at baseline ADLs Comments: Pt reports modified independence with ADLs and functional mobility with use of rollator.        OT Problem List: Decreased activity tolerance;Impaired balance (sitting and/or standing);Decreased safety awareness      OT Treatment/Interventions: Self-care/ADL training;DME and/or AE instruction;Therapeutic activities;Patient/family education;Balance training    OT Goals(Current goals can be found in the care plan section) Acute Rehab OT Goals Patient Stated Goal: to eat breakfast OT Goal Formulation: With patient Time For Goal Achievement: 04/13/23 Potential to Achieve Goals: Good ADL Goals Pt Will Perform Grooming: with modified independence;standing Pt Will Perform Lower Body Dressing: with modified independence;sit to/from stand Pt Will Transfer to Toilet: with modified independence;ambulating;bedside commode Pt Will Perform Toileting - Clothing Manipulation and hygiene: with modified independence;sit to/from stand  OT Frequency: Min 2X/week    Co-evaluation              AM-PAC OT "6 Clicks" Daily Activity     Outcome Measure Help from another person eating meals?: A Little Help from another person taking care of personal grooming?: A Little Help from another person toileting, which includes using toliet, bedpan, or urinal?: A Lot Help from another person bathing (including washing, rinsing, drying)?: A Lot Help from another person to put on and taking off regular upper body clothing?: A Little Help from another  person to put on and taking off regular lower body clothing?: A Lot 6 Click Score: 15   End of Session Equipment Utilized During Treatment: Rolling walker (2 wheels);Oxygen Nurse Communication: Mobility status  Activity Tolerance: Other (comment) (limited by anxiety) Patient left: in chair;with call  bell/phone within reach  OT Visit Diagnosis: History of falling (Z91.81);Muscle weakness (generalized) (M62.81);Dizziness and giddiness (R42)                Time: 1610-9604 OT Time Calculation (min): 32 min Charges:  OT General Charges $OT Visit: 1 Visit OT Evaluation $OT Eval Moderate Complexity: 1 Mod OT Treatments $Therapeutic Activity: 8-22 mins  Sherley Bounds, OTS Acute Rehabilitation Services Office [phone] Secure Chat Communication Preferred   Sherley Bounds 03/30/2023, 10:55 AM

## 2023-03-30 NOTE — Plan of Care (Signed)
Patient AOX4, VSS throughout shift.  All meds given on time as ordered.  Denied pain and SOB.  Diminished lungs, IS taught and encouraged.  Pt standby assist to [facility].  Skin assessment and admissions documentation done.  POC maintained, will continue to monitor.  Problem: Education: Goal: Knowledge of General Education information will improve Description: Including pain rating scale, medication(s)/side effects and non-pharmacologic comfort measures Outcome: Progressing   Problem: Health Behavior/Discharge Planning: Goal: Ability to manage health-related needs will improve Outcome: Progressing   Problem: Clinical Measurements: Goal: Ability to maintain clinical measurements within normal limits will improve Outcome: Progressing Goal: Will remain free from infection Outcome: Progressing Goal: Diagnostic test results will improve Outcome: Progressing Goal: Respiratory complications will improve Outcome: Progressing Goal: Cardiovascular complication will be avoided Outcome: Progressing   Problem: Activity: Goal: Risk for activity intolerance will decrease Outcome: Progressing   Problem: Nutrition: Goal: Adequate nutrition will be maintained Outcome: Progressing   Problem: Coping: Goal: Level of anxiety will decrease Outcome: Progressing   Problem: Elimination: Goal: Will not experience complications related to bowel motility Outcome: Progressing Goal: Will not experience complications related to urinary retention Outcome: Progressing   Problem: Pain Managment: Goal: General experience of comfort will improve Outcome: Progressing   Problem: Safety: Goal: Ability to remain free from injury will improve Outcome: Progressing   Problem: Skin Integrity: Goal: Risk for impaired skin integrity will decrease Outcome: Progressing

## 2023-03-30 NOTE — Progress Notes (Signed)
EEG complete - results pending 

## 2023-03-30 NOTE — Progress Notes (Signed)
PROGRESS NOTE    Annette [name]  ZOX:[id number] DOB: Jun 19, 1930 DOA: 03/29/2023 PCP: [place], [name], MD   Brief Narrative: Annette [name] is a 89 y.o. female with medical history significant for prior episode of syncope, chronic hypoxemic respiratory failure on 2 L nasal cannula continuously, HFpEF, paroxysmal A-fib on Eliquis, chronic pulmonary embolism, hypertension, GERD, history of pulmonary nodules, primary hyperparathyroidism, who presented to [facility] ED via EMS from home due to possible loss of consciousness while sitting, the patient is unsure.  Denies falls.  Felt nauseated today after eating hot dogs.  Associated with diffuse abdominal discomfort.  Her blood pressure was low.  EMS was activated.  She was brought into the ED for further evaluation.   In the ED, the patient was normotensive.  Complaints of abdomen pain for which she had a CT abdomen and pelvis done.  It revealed concern for colitis.  The patient received Rocephin and IV Flagyl, IV fluid hydration.  EDP requesting admission for further evaluation of syncope and of colitis.   ED Course: Temperature 97.9.  BP 180/96, pulse 85, respiratory 17, saturation 99% on room air.  Lab studies remarkable for serum potassium 3.2, serum bicarb 21, glucose 105.  CBC essentially unremarkable  Assessment & Plan:   Principal Problem:   Syncope    #1 syncope likely multifactorial.  Per patient's family patient complained of nausea headache abdominal pain shortness of breath and cough.  She was dizzy on standing up.  And she knew she was about to pass out and she did pass out.  Unsure how long she was passed out for.  Echo shows pulmonary artery hypertension normal ejection fraction.  CT of the abdomen and pelvis shows colitis and gastritis and large stool burden. Will continue Rocephin and Flagyl.  Full liquid diet.  Slow IV fluids.  Check orthostatics.  Check head CT.  Check CT angiogram of the head and neck.  #2 colitis patient complains  of abdominal pain and nausea.  Continue IV antibiotics and IV fluids.  #3 paroxysmal A-fib on Eliquis continue.  Not on any rate controlling agents.  4.  Hyperlipidemia continue Pravachol.  5.  Chronic hypoxic respiratory failure she is on 2 L of oxygen 24/7 continue.  Estimated body mass index is 24.73 kg/m as calculated from the following:   Height as of 03/08/23: 5' 2.5" (1.588 m).   Weight as of 03/08/23: 62.3 kg.  DVT prophylaxis: Eliquis  code Status: Full code  family Communication: Discussed with patient's daughter-in-law Disposition Plan:  Status is: Observation The patient will require care spanning > 2 midnights and should be moved to inpatient because: Syncope workup in progress   Consultants:  None  Procedures: None  Antimicrobials: none  Subjective: Working with pt  Objective: Vitals:   03/30/23 0039 03/30/23 0754 03/30/23 1209 03/30/23 1517  BP: (!) 180/96 (!) 164/98 (!) 159/81 (!) 155/82  Pulse: 85 77 81 83  Resp: 17 14 16 16   Temp: 97.9 F (36.6 C) 98.6 F (37 C) 98 F (36.7 C) 98.9 F (37.2 C)  TempSrc: Oral     SpO2: 99% 100% 100% 100%    Intake/Output Summary (Last 24 hours) at 03/30/2023 1602 Last data filed at 03/30/2023 0056 Gross per 24 hour  Intake 190.54 ml  Output --  Net 190.54 ml   There were no vitals filed for this visit.  Examination:  General exam: Appears calm and comfortable  Respiratory system: Clear to auscultation. Respiratory effort normal. Cardiovascular system:  S1 & S2 heard, RRR. No JVD, murmurs, rubs, gallops or clicks. No pedal edema. Gastrointestinal system: Abdomen is nondistended, soft and tender. No organomegaly or masses felt. Normal bowel sounds heard. Central nervous system: Alert and oriented. No focal neurological deficits. Extremities: Symmetric 5 x 5 power.    Data Reviewed: I have personally reviewed following labs and imaging studies  CBC: Recent Labs  Lab 03/29/23 1551 03/30/23 0437  WBC 4.9  6.9  HGB 12.3 12.9  HCT 38.4 38.4  MCV 96.2 91.4  PLT 186 165   Basic Metabolic Panel: Recent Labs  Lab 03/29/23 1655 03/30/23 0437  NA 135 136  K 3.2* 4.3  CL 105 102  CO2 21* 27  GLUCOSE 105* 98  BUN 18 12  CREATININE 0.64 0.71  CALCIUM 7.7* 9.4  MG  --  1.8  PHOS  --  2.8   GFR: CrCl cannot be calculated (Unknown ideal weight.). Liver Function Tests: Recent Labs  Lab 03/29/23 1619  AST 23  ALT 11  ALKPHOS 79  BILITOT 0.6  PROT 4.8*  ALBUMIN 3.1*   Recent Labs  Lab 03/29/23 1619  LIPASE 25   No results for input(s): "AMMONIA" in the last 168 hours. Coagulation Profile: No results for input(s): "INR", "PROTIME" in the last 168 hours. Cardiac Enzymes: No results for input(s): "CKTOTAL", "CKMB", "CKMBINDEX", "TROPONINI" in the last 168 hours. BNP (last 3 results) No results for input(s): "PROBNP" in the last 8760 hours. HbA1C: No results for input(s): "HGBA1C" in the last 72 hours. CBG: Recent Labs  Lab 03/30/23 0758 03/30/23 1211 03/30/23 1512  GLUCAP 93 82 102*   Lipid Profile: No results for input(s): "CHOL", "HDL", "LDLCALC", "TRIG", "CHOLHDL", "LDLDIRECT" in the last 72 hours. Thyroid Function Tests: No results for input(s): "TSH", "T4TOTAL", "FREET4", "T3FREE", "THYROIDAB" in the last 72 hours. Anemia Panel: No results for input(s): "VITAMINB12", "FOLATE", "FERRITIN", "TIBC", "IRON", "RETICCTPCT" in the last 72 hours. Sepsis Labs: No results for input(s): "PROCALCITON", "LATICACIDVEN" in the last 168 hours.  No results found for this or any previous visit (from the past 240 hour(s)).       Radiology Studies: ECHOCARDIOGRAM COMPLETE  Result Date: 03/30/2023    ECHOCARDIOGRAM REPORT   Patient Name:   Annette [name] Date of Exam: 03/30/2023 Medical Rec #:  [id number]       Height:       62.5 in Accession #:    [id number]      Weight:       137.4 lb Date of Birth:  16-Apr-1930      BSA:          1.640 m Patient Age:    89 years        BP:            159/81 mmHg Patient Gender: F               HR:           82 bpm. Exam Location:  Inpatient Procedure: 2D Echo, Cardiac Doppler and Color Doppler Indications:    syncope  History:        Patient has prior history of Echocardiogram examinations, most                 recent 04/20/2021. Risk Factors:Hypertension and Dyslipidemia.  Sonographer:    Delcie Roch RDCS Referring Phys: [id number] CAROLE N HALL IMPRESSIONS  1. Left ventricular ejection fraction, by estimation, is 60 to 65%. The left ventricle has normal  function. The left ventricle has no regional wall motion abnormalities. There is moderate asymmetric left ventricular hypertrophy of the basal-septal segment. Left ventricular diastolic parameters are indeterminate.  2. Right ventricular systolic function is normal. The right ventricular size is normal. There is severely elevated pulmonary artery systolic pressure. The estimated right ventricular systolic pressure is 61.0 mmHg.  3. Left atrial size was severely dilated.  4. Right atrial size was mildly dilated.  5. The mitral valve is normal in structure. Trivial mitral valve regurgitation.  6. Tricuspid valve regurgitation is mild to moderate.  7. The aortic valve is tricuspid. Aortic valve regurgitation is not visualized. Aortic valve sclerosis/calcification is present, without any evidence of aortic stenosis.  8. The inferior vena cava is dilated in size with >50% respiratory variability, suggesting right atrial pressure of 8 mmHg. FINDINGS  Left Ventricle: Left ventricular ejection fraction, by estimation, is 60 to 65%. The left ventricle has normal function. The left ventricle has no regional wall motion abnormalities. The left ventricular internal cavity size was normal in size. There is  moderate asymmetric left ventricular hypertrophy of the basal-septal segment. Left ventricular diastolic parameters are indeterminate. Right Ventricle: The right ventricular size is normal. No increase in right  ventricular wall thickness. Right ventricular systolic function is normal. There is severely elevated pulmonary artery systolic pressure. The tricuspid regurgitant velocity is 3.64 m/s, and with an assumed right atrial pressure of 8 mmHg, the estimated right ventricular systolic pressure is 61.0 mmHg. Left Atrium: Left atrial size was severely dilated. Right Atrium: Right atrial size was mildly dilated. Pericardium: There is no evidence of pericardial effusion. Mitral Valve: The mitral valve is normal in structure. Trivial mitral valve regurgitation. Tricuspid Valve: The tricuspid valve is normal in structure. Tricuspid valve regurgitation is mild to moderate. Aortic Valve: The aortic valve is tricuspid. Aortic valve regurgitation is not visualized. Aortic valve sclerosis/calcification is present, without any evidence of aortic stenosis. Pulmonic Valve: The pulmonic valve was not well visualized. Pulmonic valve regurgitation is trivial. Aorta: The aortic root and ascending aorta are structurally normal, with no evidence of dilitation. Venous: The inferior vena cava is dilated in size with greater than 50% respiratory variability, suggesting right atrial pressure of 8 mmHg. IAS/Shunts: The interatrial septum was not well visualized.  LEFT VENTRICLE PLAX 2D LVIDd:         4.20 cm LVIDs:         2.80 cm LV PW:         1.20 cm LV IVS:        1.10 cm LVOT diam:     2.10 cm LVOT Area:     3.46 cm  RIGHT VENTRICLE             IVC RV Basal diam:  2.90 cm     IVC diam: 2.70 cm RV S prime:     15.20 cm/s TAPSE (M-mode): 1.2 cm LEFT ATRIUM             Index        RIGHT ATRIUM           Index LA diam:        4.40 cm 2.68 cm/m   RA Area:     21.90 cm LA Vol (A2C):   70.7 ml 43.12 ml/m  RA Volume:   62.70 ml  38.24 ml/m LA Vol (A4C):   90.6 ml 55.26 ml/m LA Biplane Vol: 86.8 ml 52.94 ml/m   AORTA Ao Root diam: 2.70 cm  Ao Asc diam:  3.50 cm TRICUSPID VALVE TR Peak grad:   53.0 mmHg TR Vmax:        364.00 cm/s  SHUNTS  Systemic Diam: 2.10 cm Epifanio Lesches MD Electronically signed by Epifanio Lesches MD Signature Date/Time: 03/30/2023/3:29:38 PM    Final    CT ABDOMEN PELVIS W CONTRAST  Result Date: 03/29/2023 CLINICAL DATA:  Syncope, nausea, dizziness, abdominal pain EXAM: CT ABDOMEN AND PELVIS WITH CONTRAST TECHNIQUE: Multidetector CT imaging of the abdomen and pelvis was performed using the standard protocol following bolus administration of intravenous contrast. RADIATION DOSE REDUCTION: This exam was performed according to the departmental dose-optimization program which includes automated exposure control, adjustment of the mA and/or kV according to patient size and/or use of iterative reconstruction technique. CONTRAST:  75mL OMNIPAQUE IOHEXOL 350 MG/ML SOLN COMPARISON:  Abdominal radiographs 03/29/2023 FINDINGS: Lower chest: No acute abnormality. Hepatobiliary: No focal hepatic lesion. Mild periportal edema. Hyperdensity within the gallbladder may be due to sludge. No biliary dilation. Pancreas: No acute abnormality. Spleen: Unremarkable. Adrenals/Urinary Tract: Normal adrenal glands. No urinary calculi or hydronephrosis. Unremarkable bladder. Stomach/Bowel: Normal caliber large and small bowel. Extensive sigmoid diverticulosis. Mild wall thickening and mucosal hyperenhancement about the cecum and ascending colon. Moderate stool in the cecum. Fecalization of the terminal ileum. Mild wall thickening versus underdistention of the gastric antrum. Vascular/Lymphatic: Aortic atherosclerosis. No enlarged abdominal or pelvic lymph nodes. Reproductive: Hysterectomy. Other: Mesenteric and body wall edema. Musculoskeletal: Thoracolumbar spondylosis.  No acute abnormality. IMPRESSION: 1. Mild wall thickening and mucosal hyperenhancement about the cecum and ascending colon compatible with colitis. 2. Mild gastritis versus underdistention of the gastric antrum. 3. Large amount stool in the cecum with terminal fecalization  of the terminal ileum may be due to constipation/slow transit. 4. Extensive sigmoid diverticulosis. 5. Periportal, mesenteric, and body wall edema. Correlate with fluid status. Aortic Atherosclerosis (ICD10-I70.0). Electronically Signed   By: Minerva Fester M.D.   On: 03/29/2023 20:07   DG Abdomen Acute W/Chest  Result Date: 03/29/2023 CLINICAL DATA:  Syncope nausea EXAM: DG ABDOMEN ACUTE WITH 1 VIEW CHEST COMPARISON:  12/19/2021 FINDINGS: Single view chest demonstrates cardiomegaly with aortic atherosclerosis. No acute airspace disease or pleural effusion. No pneumothorax. Supine and upright views of the abdomen demonstrate no free air beneath the diaphragm. Nonobstructed gas pattern with average stool burden. No radiopaque calculi IMPRESSION: Negative abdominal radiographs. No acute cardiopulmonary disease. Cardiomegaly. Electronically Signed   By: Jasmine Pang M.D.   On: 03/29/2023 17:22     Scheduled Meds:  apixaban  5 mg Oral BID   cholecalciferol  1,000 Units Oral QHS   [START ON 04/02/2023] cyanocobalamin  1,000 mcg Oral Once per day on Tue Thu   latanoprost  1 drop Both Eyes QHS   pantoprazole  40 mg Oral Daily   pravastatin  20 mg Oral Daily   Continuous Infusions:   LOS: 0 days    Time spent: 39 min  Alwyn Ren, MD  03/30/2023, 4:02 PM

## 2023-03-30 NOTE — Evaluation (Signed)
Physical Therapy Evaluation Patient Details Name: Annette [name] MRN: [id number] DOB: [birth year] Today's Date: 03/30/2023  History of Present Illness  Annette [name] is a 89 y.o. female who presents with reports of syncope while sitting, nausea and dizziness. CT abdomen and pelvis revealed concern for colitis. PMHx: prior episode of syncope, chronic hypoxemic respiratory failure on 2 L nasal cannula continuously, HFpEF, paroxysmal A-fib on Eliquis, chronic pulmonary embolism, hypertension, GERD, history of pulmonary nodules and primary hyperparathyroidism.  Clinical Impression   Pt presents with generalized weakness, anxiety with mobility, impaired activity tolerance, and impaired sitting balance (anticipate impaired standing balance, pt declines OOB given BP). Pt to benefit from acute PT to address deficits. Pt requiring supervision for safety for bed mobility only at this time. Anticipate good progress. PT to progress mobility as tolerated, and will continue to follow acutely.         Recommendations for follow up therapy are one component of a multi-disciplinary discharge planning process, led by the attending physician.  Recommendations may be updated based on patient status, additional functional criteria and insurance authorization.  Follow Up Recommendations       Assistance Recommended at Discharge Intermittent Supervision/Assistance  Patient can return home with the following  A little help with walking and/or transfers;A little help with bathing/dressing/bathroom    Equipment Recommendations None recommended by PT  Recommendations for Other Services       Functional Status Assessment Patient has had a recent decline in their functional status and demonstrates the ability to make significant improvements in function in a reasonable and predictable amount of time.     Precautions / Restrictions Precautions Precautions: Fall Precaution Comments: fall Restrictions Weight  Bearing Restrictions: No      Mobility  Bed Mobility Overal bed mobility: Needs Assistance Bed Mobility: Supine to Sit, Sit to Supine     Supine to sit: Supervision, HOB elevated Sit to supine: Supervision, HOB elevated   General bed mobility comments: HOB elevated, use of bedrails    Transfers                   General transfer comment: pt declines given elevated BP (SBP 170s-180s)    Ambulation/Gait                  Stairs            Wheelchair Mobility    Modified Rankin (Stroke Patients Only)       Balance Overall balance assessment: Needs assistance Sitting-balance support: Feet supported, No upper extremity supported Sitting balance-Leahy Scale: Good     Standing balance support: Bilateral upper extremity supported, Reliant on assistive device for balance Standing balance-Leahy Scale: Poor                               Pertinent Vitals/Pain Pain Assessment Pain Assessment: Faces Faces Pain Scale: No hurt Pain Intervention(s): Monitored during session    Home Living Family/patient expects to be discharged to:: Private residence Living Arrangements: Children Available Help at Discharge: Available 24 hours/day (son works during the day, DIL available 24/7) Type of Home: House Home Access: Ramped entrance       Home Layout: One level Home Equipment: Rollator (4 wheels);Shower seat;BSC/3in1;Grab bars - tub/shower Additional Comments: Pt reports she has not left her house for 4 years except for doctors appointments    Prior Function Prior Level of Function : Independent/Modified Independent  Mobility Comments: uses rollator at baseline ADLs Comments: Pt reports modified independence with ADLs and functional mobility with use of rollator.     Hand Dominance   Dominant Hand: Left    Extremity/Trunk Assessment   Upper Extremity Assessment Upper Extremity Assessment: Defer to OT evaluation     Lower Extremity Assessment Lower Extremity Assessment: Generalized weakness    Cervical / Trunk Assessment Cervical / Trunk Assessment: Kyphotic  Communication   Communication: No difficulties  Cognition Arousal/Alertness: Awake/alert Behavior During Therapy: Anxious Overall Cognitive Status: Within Functional Limits for tasks assessed                                 General Comments: pt anxious at baseline, increasingly anxious today about O2 and high BP readings        General Comments General comments (skin integrity, edema, etc.): SBP 170s-180s    Exercises     Assessment/Plan    PT Assessment Patient needs continued PT services  PT Problem List Decreased strength;Decreased mobility;Decreased activity tolerance;Decreased balance;Decreased knowledge of use of DME;Pain       PT Treatment Interventions DME instruction;Therapeutic activities;Gait training;Therapeutic exercise;Patient/family education;Balance training;Stair training;Functional mobility training;Neuromuscular re-education    PT Goals (Current goals can be found in the Care Plan section)  Acute Rehab PT Goals PT Goal Formulation: With patient Time For Goal Achievement: 04/13/23 Potential to Achieve Goals: Good    Frequency Min 3X/week     Co-evaluation               AM-PAC PT "6 Clicks" Mobility  Outcome Measure Help needed turning from your back to your side while in a flat bed without using bedrails?: A Little Help needed moving from lying on your back to sitting on the side of a flat bed without using bedrails?: A Little Help needed moving to and from a bed to a chair (including a wheelchair)?: A Little Help needed standing up from a chair using your arms (e.g., wheelchair or bedside chair)?: A Little Help needed to walk in hospital room?: A Little Help needed climbing 3-5 steps with a railing? : A Little 6 Click Score: 18    End of Session Equipment Utilized During  Treatment: Oxygen Activity Tolerance: Patient tolerated treatment well;Other (comment) (limited by anxiety) Patient left: in bed;with bed alarm set;with call bell/phone within reach Nurse Communication: Mobility status PT Visit Diagnosis: Other abnormalities of gait and mobility (R26.89);Muscle weakness (generalized) (M62.81)    Time: 1610-9604 PT Time Calculation (min) (ACUTE ONLY): 26 min   Charges:   PT Evaluation $PT Eval Low Complexity: 1 Low          Marijean Montanye S, PT DPT Acute Rehabilitation Services Secure Chat Preferred  Office [phone]   Malcomb Gangemi Sheliah Plane 03/30/2023, 10:44 AM

## 2023-03-31 ENCOUNTER — Observation Stay (HOSPITAL_COMMUNITY): Payer: Medicare HMO

## 2023-03-31 DIAGNOSIS — J432 Centrilobular emphysema: Secondary | ICD-10-CM | POA: Diagnosis not present

## 2023-03-31 DIAGNOSIS — I5032 Chronic diastolic (congestive) heart failure: Secondary | ICD-10-CM | POA: Diagnosis not present

## 2023-03-31 DIAGNOSIS — K529 Noninfective gastroenteritis and colitis, unspecified: Secondary | ICD-10-CM | POA: Diagnosis not present

## 2023-03-31 DIAGNOSIS — R42 Dizziness and giddiness: Secondary | ICD-10-CM | POA: Diagnosis not present

## 2023-03-31 MED ORDER — FUROSEMIDE 10 MG/ML IJ SOLN
60.0000 mg | Freq: Once | INTRAMUSCULAR | Status: AC
  Administered 2023-03-31: 60 mg via INTRAVENOUS
  Filled 2023-03-31: qty 6

## 2023-03-31 MED ORDER — IOHEXOL 350 MG/ML SOLN
75.0000 mL | Freq: Once | INTRAVENOUS | Status: AC | PRN
  Administered 2023-03-31: 75 mL via INTRAVENOUS

## 2023-03-31 MED ORDER — AMOXICILLIN-POT CLAVULANATE 500-125 MG PO TABS: 1.0000 | ORAL_TABLET | Freq: Three times a day (TID) | ORAL | 0 refills | Status: DC

## 2023-03-31 NOTE — Progress Notes (Signed)
Patient was hooked up to 3L [place] on portable tank.   Annette [name]

## 2023-03-31 NOTE — Progress Notes (Signed)
Discharge instructions given with stated understanding 

## 2023-03-31 NOTE — Progress Notes (Signed)
Patient was hooked back to 3L [place] from the wall source.  Annette [name]

## 2023-03-31 NOTE — Discharge Summary (Signed)
Physician Discharge Summary  Annette [name]:[id number] DOB: [birth year] DOA: 03/29/2023  PCP: [place], [name], MD  Admit date: 03/29/2023 Discharge date: 03/31/2023  Admitted From: Home Disposition: Home  Recommendations for Outpatient Follow-up:  Follow up with PCP in 1-2 weeks Please obtain BMP/CBC in one week Referral to pulmonologist for pulmonary hypertension  Home Health: None Equipment/Devices: None Discharge Condition: Stable CODE STATUS: Full code Diet recommendation: Cardiac Brief/Interim Summary:  Annette [name] is a 89 y.o. female with medical history significant for prior episode of syncope, chronic hypoxemic respiratory failure on 2 L nasal cannula continuously, HFpEF, paroxysmal A-fib on Eliquis, chronic pulmonary embolism, hypertension, GERD, history of pulmonary nodules, primary hyperparathyroidism, who presented to [facility] ED via EMS from home due to possible loss of consciousness while sitting, the patient is unsure.  Denies falls.  Felt nauseated today after eating hot dogs.  Associated with diffuse abdominal discomfort.  Her blood pressure was low.  EMS was activated.  She was brought into the ED for further evaluation.   In the ED, the patient was normotensive.  Complaints of abdomen pain for which she had a CT abdomen and pelvis done.  It revealed concern for colitis.  The patient received Rocephin and IV Flagyl, IV fluid hydration.  EDP requesting admission for further evaluation of syncope and of colitis.   ED Course: Temperature 97.9.  BP 180/96, pulse 85, respiratory 17, saturation 99% on room air.  Lab studies remarkable for serum potassium 3.2, serum bicarb 21, glucose 105.  CBC essentially unremarkable Discharge Diagnoses:  Principal Problem:   Syncope Active Problems:   Colitis    #1 syncope likely multifactorial.  Per patient's family patient complained of nausea headache abdominal pain shortness of breath and cough.  She was dizzy on standing up.  And  she knew she was about to pass out and she did pass out.  Unsure how long she was passed out for.  Echo shows pulmonary artery hypertension normal ejection fraction.  CT of the abdomen and pelvis shows colitis and gastritis and large stool burden. She was treated with slow IV fluids Rocephin and Flagyl. CT angiogram of the head and neck and head CT showed no acute findings other than atrophy.  She was orthostatic. Diuretics were on hold.  #2 colitis she was treated with Rocephin and Flagyl.  She tolerated a diet without nausea vomiting or abdominal pain on the day of discharge.  I will discharge her on Augmentin for 5 more days.    #3 paroxysmal A-fib on Eliquis continue.  Not on any rate controlling agents.   4.  Hyperlipidemia continue Pravachol.   5.  Chronic hypoxic respiratory failure she is on 2 L of oxygen 24/7 continue.  Continue diuretics as she was taking prior to admission  Estimated body mass index is 24.73 kg/m as calculated from the following:   Height as of 03/08/23: 5' 2.5" (1.588 m).   Weight as of 03/08/23: 62.3 kg.  Discharge Instructions  Discharge Instructions     Diet - low sodium heart healthy   Complete by: As directed    Increase activity slowly   Complete by: As directed       Allergies as of 03/31/2023       Reactions   Codeine Nausea And Vomiting        Medication List     STOP taking these medications    albuterol 108 (90 Base) MCG/ACT inhaler Commonly known as: VENTOLIN HFA  TAKE these medications    acetaminophen 500 MG tablet Commonly known as: TYLENOL Take 500 mg by mouth 3 (three) times daily.   amoxicillin-clavulanate 500-125 MG tablet Commonly known as: Augmentin Take 1 tablet by mouth 3 (three) times daily.   cinacalcet 30 MG tablet Commonly known as: Sensipar Take 1 tablet (30 mg total) by mouth daily.   cyanocobalamin 1000 MCG tablet Commonly known as: VITAMIN B12 Take 1,000 mcg by mouth 2 (two) times a week.  Tuesday and thursday   Eliquis 5 MG Tabs tablet Generic drug: apixaban Take 1 tablet (5 mg total) by mouth 2 (two) times daily.   furosemide 40 MG tablet Commonly known as: LASIX TAKE 1 TABLET EVERY DAY   latanoprost 0.005 % ophthalmic solution Commonly known as: XALATAN INSTILL 1 DROP INTO BOTH EYES AT BEDTIME   magnesium oxide 400 (240 Mg) MG tablet Commonly known as: MAG-OX TAKE 1 TABLET EVERY DAY   omeprazole 40 MG capsule Commonly known as: PRILOSEC TAKE 1 CAPSULE EVERY DAY   OXYGEN Inhale 2 mLs into the lungs daily.   pravastatin 20 MG tablet Commonly known as: PRAVACHOL Take 1 tablet (20 mg total) by mouth daily.   VITAMIN D PO Take 1,000 Units by mouth at bedtime.        Allergies  Allergen Reactions   Codeine Nausea And Vomiting    Consultations: None   Procedures/Studies: CT ANGIO HEAD NECK W WO CM  Result Date: 03/31/2023 CLINICAL DATA:  Initial evaluation for vertigo. EXAM: CT ANGIOGRAPHY HEAD AND NECK WITH AND WITHOUT CONTRAST TECHNIQUE: Multidetector CT imaging of the head and neck was performed using the standard protocol during bolus administration of intravenous contrast. Multiplanar CT image reconstructions and MIPs were obtained to evaluate the vascular anatomy. Carotid stenosis measurements (when applicable) are obtained utilizing NASCET criteria, using the distal internal carotid diameter as the denominator. RADIATION DOSE REDUCTION: This exam was performed according to the departmental dose-optimization program which includes automated exposure control, adjustment of the mA and/or kV according to patient size and/or use of iterative reconstruction technique. CONTRAST:  75mL OMNIPAQUE IOHEXOL 350 MG/ML SOLN COMPARISON:  Prior study from 11/11/2016. FINDINGS: CT HEAD FINDINGS Brain: Age-related cerebral atrophy with mild chronic small vessel ischemic disease. No acute intracranial hemorrhage. No convincing large vessel territory infarct. Apparent  asymmetric hyperdensity involving the anterior right temporal lobe noted, favored to be artifactual due to patient visualized staining. No mass lesion, mass effect or midline shift. No hydrocephalus or extra-axial fluid collection. Vascular: No abnormal hyperdense vessel. Scattered vascular calcifications noted within the carotid siphons. Skull: Scalp soft tissues demonstrate no acute finding. Calvarium intact. Sinuses/Orbits: Ocular senescent calcifications noted. Paranasal sinuses are clear. No mastoid effusion. Other: None. Review of the MIP images confirms the above findings CTA NECK FINDINGS Aortic arch: A visualized aortic arch normal in caliber. Moderate atheromatous change about the arch itself. Bovine branching pattern noted. No significant stenosis about the origin the great vessels. Right carotid system: Right common and internal carotid arteries are patent without stenosis or dissection. Left carotid system: Left common and internal carotid arteries are patent without stenosis or dissection. Vertebral arteries: Both vertebral arteries arise from subclavian arteries. No proximal subclavian artery stenosis. Vertebral arteries are patent without stenosis or dissection. Skeleton: No discrete or worrisome osseous lesions. Moderate to advanced osteoarthritic changes present about the right atlantooccipital articulation. Moderate spondylosis present at C4-5. Degenerative changes noted about the left TMJ. Other neck: No other acute abnormality within the neck. Upper chest:  Mild scattered interlobular septal thickening within the visualized lungs, suggesting mild pulmonary interstitial congestion/edema. Review of the MIP images confirms the above findings CTA HEAD FINDINGS Anterior circulation: Intracranial circulation is somewhat dolichoectatic in appearance. Both internal carotid arteries are patent without stenosis. A1 segments, anterior communicating complex common anterior cerebral arteries patent without  stenosis. No M1 stenosis or occlusion. Normal proximal MCA branch occlusion or high-grade stenosis. Distal MCA branches perfused and symmetric. Posterior circulation: Both V4 segments patent without stenosis. Left vertebral artery dominant. Both PICA patent. Basilar widely patent without stenosis. Superior cerebral arteries patent bilaterally. Right PCA supplied via the basilar. Fetal type origin of the left PCA. Both PCAs patent without stenosis. Venous sinuses: Patent allowing for timing the contrast bolus. Anatomic variants: As above.  No aneurysm. Review of the MIP images confirms the above findings IMPRESSION: CT HEAD IMPRESSION: 1. No acute intracranial abnormality. 2. Age-related cerebral atrophy with mild chronic small vessel ischemic disease. CTA HEAD AND NECK IMPRESSION: 1. Negative CTA of the head and neck. No large vessel occlusion or other emergent finding. No hemodynamically significant or correctable stenosis. 2. Mild scattered interlobular septal thickening within the visualized lungs, suggesting mild pulmonary interstitial congestion/edema. Aortic Atherosclerosis (ICD10-I70.0). Electronically Signed   By: Rise Mu M.D.   On: 03/31/2023 01:52   EEG adult  Result Date: 03/30/2023 Charlsie Quest, MD     03/30/2023  9:10 PM Patient Name: Annette [name] MRN: [id number] Epilepsy Attending: Charlsie Quest Referring Physician/Provider: Alwyn Ren, MD Date: 03/30/2023 Duration: 29.30 mins Patient history: 89 y.o. female with medical history significant for prior episode of syncope who presented to [facility] ED via EMS from home due to possible loss of consciousness while sitting, the patient is unsure. EEG to evaluate for seizure. Level of alertness: Awake, asleep AEDs during EEG study: None Technical aspects: This EEG study was done with scalp electrodes positioned according to the 10-20 International system of electrode placement. Electrical activity was reviewed with band pass  filter of 1-70Hz , sensitivity of 7 uV/mm, display speed of 63mm/sec with a 60Hz  notched filter applied as appropriate. EEG data were recorded continuously and digitally stored.  Video monitoring was available and reviewed as appropriate. Description: The posterior dominant rhythm consists of 8 Hz activity of moderate voltage (25-35 uV) seen predominantly in posterior head regions, symmetric and reactive to eye opening and eye closing. Sleep was characterized by vertex waves, sleep spindles (12 to 14 Hz), maximal frontocentral region. Hyperventilation and photic stimulation were not performed.   IMPRESSION: This study is within normal limits. No seizures or epileptiform discharges were seen throughout the recording. A normal interictal EEG does not exclude the diagnosis of epilepsy. Charlsie Quest   DG Chest 1 View  Result Date: 03/30/2023 CLINICAL DATA:  Syncope EXAM: CHEST  1 VIEW COMPARISON:  03/29/2023 prior studies FINDINGS: Cardiomegaly again noted. There is no evidence of focal airspace disease, pulmonary edema, suspicious pulmonary nodule/mass, pleural effusion, or pneumothorax. No acute bony abnormalities are identified. IMPRESSION: Cardiomegaly without acute cardiopulmonary disease. Electronically Signed   By: Harmon Pier M.D.   On: 03/30/2023 16:54   ECHOCARDIOGRAM COMPLETE  Result Date: 03/30/2023    ECHOCARDIOGRAM REPORT   Patient Name:   ANAVEY BRULL Date of Exam: 03/30/2023 Medical Rec #:  [id number]       Height:       62.5 in Accession #:    [id number]      Weight:       137.4  lb Date of Birth:  03/31/1930      BSA:          1.640 m Patient Age:    89 years        BP:           159/81 mmHg Patient Gender: F               HR:           82 bpm. Exam Location:  Inpatient Procedure: 2D Echo, Cardiac Doppler and Color Doppler Indications:    syncope  History:        Patient has prior history of Echocardiogram examinations, most                 recent 04/20/2021. Risk Factors:Hypertension and  Dyslipidemia.  Sonographer:    Delcie Roch RDCS Referring Phys: [id number] CAROLE N HALL IMPRESSIONS  1. Left ventricular ejection fraction, by estimation, is 60 to 65%. The left ventricle has normal function. The left ventricle has no regional wall motion abnormalities. There is moderate asymmetric left ventricular hypertrophy of the basal-septal segment. Left ventricular diastolic parameters are indeterminate.  2. Right ventricular systolic function is normal. The right ventricular size is normal. There is severely elevated pulmonary artery systolic pressure. The estimated right ventricular systolic pressure is 61.0 mmHg.  3. Left atrial size was severely dilated.  4. Right atrial size was mildly dilated.  5. The mitral valve is normal in structure. Trivial mitral valve regurgitation.  6. Tricuspid valve regurgitation is mild to moderate.  7. The aortic valve is tricuspid. Aortic valve regurgitation is not visualized. Aortic valve sclerosis/calcification is present, without any evidence of aortic stenosis.  8. The inferior vena cava is dilated in size with >50% respiratory variability, suggesting right atrial pressure of 8 mmHg. FINDINGS  Left Ventricle: Left ventricular ejection fraction, by estimation, is 60 to 65%. The left ventricle has normal function. The left ventricle has no regional wall motion abnormalities. The left ventricular internal cavity size was normal in size. There is  moderate asymmetric left ventricular hypertrophy of the basal-septal segment. Left ventricular diastolic parameters are indeterminate. Right Ventricle: The right ventricular size is normal. No increase in right ventricular wall thickness. Right ventricular systolic function is normal. There is severely elevated pulmonary artery systolic pressure. The tricuspid regurgitant velocity is 3.64 m/s, and with an assumed right atrial pressure of 8 mmHg, the estimated right ventricular systolic pressure is 61.0 mmHg. Left Atrium: Left  atrial size was severely dilated. Right Atrium: Right atrial size was mildly dilated. Pericardium: There is no evidence of pericardial effusion. Mitral Valve: The mitral valve is normal in structure. Trivial mitral valve regurgitation. Tricuspid Valve: The tricuspid valve is normal in structure. Tricuspid valve regurgitation is mild to moderate. Aortic Valve: The aortic valve is tricuspid. Aortic valve regurgitation is not visualized. Aortic valve sclerosis/calcification is present, without any evidence of aortic stenosis. Pulmonic Valve: The pulmonic valve was not well visualized. Pulmonic valve regurgitation is trivial. Aorta: The aortic root and ascending aorta are structurally normal, with no evidence of dilitation. Venous: The inferior vena cava is dilated in size with greater than 50% respiratory variability, suggesting right atrial pressure of 8 mmHg. IAS/Shunts: The interatrial septum was not well visualized.  LEFT VENTRICLE PLAX 2D LVIDd:         4.20 cm LVIDs:         2.80 cm LV PW:         1.20 cm LV IVS:  1.10 cm LVOT diam:     2.10 cm LVOT Area:     3.46 cm  RIGHT VENTRICLE             IVC RV Basal diam:  2.90 cm     IVC diam: 2.70 cm RV S prime:     15.20 cm/s TAPSE (M-mode): 1.2 cm LEFT ATRIUM             Index        RIGHT ATRIUM           Index LA diam:        4.40 cm 2.68 cm/m   RA Area:     21.90 cm LA Vol (A2C):   70.7 ml 43.12 ml/m  RA Volume:   62.70 ml  38.24 ml/m LA Vol (A4C):   90.6 ml 55.26 ml/m LA Biplane Vol: 86.8 ml 52.94 ml/m   AORTA Ao Root diam: 2.70 cm Ao Asc diam:  3.50 cm TRICUSPID VALVE TR Peak grad:   53.0 mmHg TR Vmax:        364.00 cm/s  SHUNTS Systemic Diam: 2.10 cm Epifanio Lesches MD Electronically signed by Epifanio Lesches MD Signature Date/Time: 03/30/2023/3:29:38 PM    Final    CT ABDOMEN PELVIS W CONTRAST  Result Date: 03/29/2023 CLINICAL DATA:  Syncope, nausea, dizziness, abdominal pain EXAM: CT ABDOMEN AND PELVIS WITH CONTRAST TECHNIQUE:  Multidetector CT imaging of the abdomen and pelvis was performed using the standard protocol following bolus administration of intravenous contrast. RADIATION DOSE REDUCTION: This exam was performed according to the departmental dose-optimization program which includes automated exposure control, adjustment of the mA and/or kV according to patient size and/or use of iterative reconstruction technique. CONTRAST:  75mL OMNIPAQUE IOHEXOL 350 MG/ML SOLN COMPARISON:  Abdominal radiographs 03/29/2023 FINDINGS: Lower chest: No acute abnormality. Hepatobiliary: No focal hepatic lesion. Mild periportal edema. Hyperdensity within the gallbladder may be due to sludge. No biliary dilation. Pancreas: No acute abnormality. Spleen: Unremarkable. Adrenals/Urinary Tract: Normal adrenal glands. No urinary calculi or hydronephrosis. Unremarkable bladder. Stomach/Bowel: Normal caliber large and small bowel. Extensive sigmoid diverticulosis. Mild wall thickening and mucosal hyperenhancement about the cecum and ascending colon. Moderate stool in the cecum. Fecalization of the terminal ileum. Mild wall thickening versus underdistention of the gastric antrum. Vascular/Lymphatic: Aortic atherosclerosis. No enlarged abdominal or pelvic lymph nodes. Reproductive: Hysterectomy. Other: Mesenteric and body wall edema. Musculoskeletal: Thoracolumbar spondylosis.  No acute abnormality. IMPRESSION: 1. Mild wall thickening and mucosal hyperenhancement about the cecum and ascending colon compatible with colitis. 2. Mild gastritis versus underdistention of the gastric antrum. 3. Large amount stool in the cecum with terminal fecalization of the terminal ileum may be due to constipation/slow transit. 4. Extensive sigmoid diverticulosis. 5. Periportal, mesenteric, and body wall edema. Correlate with fluid status. Aortic Atherosclerosis (ICD10-I70.0). Electronically Signed   By: Minerva Fester M.D.   On: 03/29/2023 20:07   DG Abdomen Acute  W/Chest  Result Date: 03/29/2023 CLINICAL DATA:  Syncope nausea EXAM: DG ABDOMEN ACUTE WITH 1 VIEW CHEST COMPARISON:  12/19/2021 FINDINGS: Single view chest demonstrates cardiomegaly with aortic atherosclerosis. No acute airspace disease or pleural effusion. No pneumothorax. Supine and upright views of the abdomen demonstrate no free air beneath the diaphragm. Nonobstructed gas pattern with average stool burden. No radiopaque calculi IMPRESSION: Negative abdominal radiographs. No acute cardiopulmonary disease. Cardiomegaly. Electronically Signed   By: Jasmine Pang M.D.   On: 03/29/2023 17:22   (Echo, Carotid, EGD, Colonoscopy, ERCP)    Subjective: Anxious to  go home no other complaints no dizziness nausea vomiting abdominal pain tolerated food today.  Discharge Exam: Vitals:   03/31/23 0403 03/31/23 0816  BP: (!) 156/89 (!) 182/97  Pulse: 87 87  Resp: 16 18  Temp: 98.1 F (36.7 C) 98.1 F (36.7 C)  SpO2: 94% 100%   Vitals:   03/30/23 1517 03/30/23 2108 03/31/23 0403 03/31/23 0816  BP: (!) 155/82 (!) 141/97 (!) 156/89 (!) 182/97  Pulse: 83 (!) 59 87 87  Resp: 16 15 16 18   Temp: 98.9 F (37.2 C) 98 F (36.7 C) 98.1 F (36.7 C) 98.1 F (36.7 C)  TempSrc:  Oral Oral   SpO2: 100% 98% 94% 100%    General: Pt is alert, awake, not in acute distress Cardiovascular: RRR, S1/S2 +, no rubs, no gallops Respiratory: CTA bilaterally, no wheezing, no rhonchi Abdominal: Soft, NT, ND, bowel sounds + Extremities: no edema, no cyanosis    The results of significant diagnostics from this hospitalization (including imaging, microbiology, ancillary and laboratory) are listed below for reference.     Microbiology: No results found for this or any previous visit (from the past 240 hour(s)).   Labs: BNP (last 3 results) No results for input(s): "BNP" in the last 8760 hours. Basic Metabolic Panel: Recent Labs  Lab 03/29/23 1655 03/30/23 0437  NA 135 136  K 3.2* 4.3  CL 105 102  CO2  21* 27  GLUCOSE 105* 98  BUN 18 12  CREATININE 0.64 0.71  CALCIUM 7.7* 9.4  MG  --  1.8  PHOS  --  2.8   Liver Function Tests: Recent Labs  Lab 03/29/23 1619  AST 23  ALT 11  ALKPHOS 79  BILITOT 0.6  PROT 4.8*  ALBUMIN 3.1*   Recent Labs  Lab 03/29/23 1619  LIPASE 25   No results for input(s): "AMMONIA" in the last 168 hours. CBC: Recent Labs  Lab 03/29/23 1551 03/30/23 0437  WBC 4.9 6.9  HGB 12.3 12.9  HCT 38.4 38.4  MCV 96.2 91.4  PLT 186 165   Cardiac Enzymes: No results for input(s): "CKTOTAL", "CKMB", "CKMBINDEX", "TROPONINI" in the last 168 hours. BNP: Invalid input(s): "POCBNP" CBG: Recent Labs  Lab 03/30/23 0758 03/30/23 1211 03/30/23 1512  GLUCAP 93 82 102*   D-Dimer No results for input(s): "DDIMER" in the last 72 hours. Hgb A1c No results for input(s): "HGBA1C" in the last 72 hours. Lipid Profile No results for input(s): "CHOL", "HDL", "LDLCALC", "TRIG", "CHOLHDL", "LDLDIRECT" in the last 72 hours. Thyroid function studies No results for input(s): "TSH", "T4TOTAL", "T3FREE", "THYROIDAB" in the last 72 hours.  Invalid input(s): "FREET3" Anemia work up No results for input(s): "VITAMINB12", "FOLATE", "FERRITIN", "TIBC", "IRON", "RETICCTPCT" in the last 72 hours. Urinalysis    Component Value Date/Time   COLORURINE YELLOW 03/29/2023 1759   APPEARANCEUR HAZY (A) 03/29/2023 1759   LABSPEC 1.012 03/29/2023 1759   PHURINE 6.0 03/29/2023 1759   GLUCOSEU NEGATIVE 03/29/2023 1759   HGBUR NEGATIVE 03/29/2023 1759   BILIRUBINUR NEGATIVE 03/29/2023 1759   KETONESUR NEGATIVE 03/29/2023 1759   PROTEINUR NEGATIVE 03/29/2023 1759   NITRITE NEGATIVE 03/29/2023 1759   LEUKOCYTESUR NEGATIVE 03/29/2023 1759   Sepsis Labs Recent Labs  Lab 03/29/23 1551 03/30/23 0437  WBC 4.9 6.9   Microbiology No results found for this or any previous visit (from the past 240 hour(s)).   Time coordinating discharge: 38 minutes  SIGNED:  Alwyn Ren, MD  [facility] 03/31/2023, 1:42 PM

## 2023-03-31 NOTE — Plan of Care (Signed)

## 2023-03-31 NOTE — Plan of Care (Signed)

## 2023-04-01 ENCOUNTER — Telehealth: Payer: Self-pay

## 2023-04-01 DIAGNOSIS — I4819 Other persistent atrial fibrillation: Secondary | ICD-10-CM

## 2023-04-01 DIAGNOSIS — I10 Essential (primary) hypertension: Secondary | ICD-10-CM

## 2023-04-01 NOTE — Telephone Encounter (Signed)
-----   Message from Sherrill Raring, [facility] sent at 04/01/2023  4:43 PM EDT ----- Regarding: 2300 Pharmacy Referral Request Hello,  Patient is currently flagged as high risk for re-hospitalization. Would like to meet with patient to review medications and her health concerns/goals.  Could a 2300-Pharmacy referral be placed for patient so she can be contacted for scheduling?  Thank you, Sherrill Raring Clinical Pharmacist [phone]

## 2023-04-04 ENCOUNTER — Telehealth: Payer: Self-pay

## 2023-04-04 NOTE — Progress Notes (Signed)
Care Management & Coordination Services Pharmacy Team  Reason for Encounter: Appointment Reminder  Contacted patient to confirm telephone appointment with Delano Metz, PharmD on 04/10/2023 at 11:00. Spoke with patient on 04/04/2023    Have you seen any other providers since your last visit? ** Patient denies  Any changes in your medications or health? Patient denies  Any side effects from any medications? Patient denies  Do you have an symptoms or problems not managed by your medications? Patient denies  Any concerns about your health right now? Patient denies  Has your provider asked that you check blood pressure, blood sugar, or follow special diet at home? Patient is checking weight and blood pressure daily. She is following a salt free diet  Do you get any type of exercise on a regular basis? Patient states she stays active everyday doing laundry and cleaning.   Can you think of a goal you would like to reach for your health? Continue to stay as healthy as she is.   Do you have any problems getting your medications? Patient denies  Is there anything that you would like to discuss during the appointment? Patient denies  Patient advised to have medications and supplements ready for her appointment    Chart review:  Recent office visits:  12/11/2022 Betty Swaziland MD - Patient was seen for essential hypertension and additional concerns. Discontinue Benzonatate.   08/22/2023 Elisha Ponder LPN - Encounter for Medicare annual wellness exam  Recent consult visits:  03/08/2023 Carlus Pavlov MD (endo) -Patient was seen for Primary hyperparathyroidism and additional concerns. No medication changes.   01/21/2023 Chi Everardo All MD (pulmonary) - Patient was seen for Chronic hypoxemic respiratory failure and an additional concern. No medication changes.   11/26/2022 Waynetta Pean PA-C (urgent care) - Patient was seen for Covid and additional concerns. Started molnupiravir 200  mg.  10/23/2023 Epifanio Lesches MD (cardiology) - Patient was seen for permanent atrial fibrillation and additional concerns. Discontinued Keflex, Sensipar and Spiriva Respimat.  Hospital visits:  Admitted to [facility] on 03/29/2023 due to syncope and colitis. Discharge date was 03/31/2023.   New?Medications Started at [facility] Discharge:?? Augmentin Medication Changes at Hospital Discharge: None Medications Discontinued at Hospital Discharge: Ventolin HFA Medications that remain the same after Hospital Discharge:??  -All other medications will remain the same.     Care Gaps: AWV - completed 08/21/2022 Last BP - 160/98 on 03/08/2023 Shingrix - never done Covid - overdue  Star Rating Drugs:  Pravastatin 20 mg - last filled 02/08/2023 90 DS at [facility]  Clinical Pharmacist Assistant [phone]

## 2023-04-05 NOTE — Progress Notes (Unsigned)
Care Management & Coordination Services Pharmacy Note  04/10/2023 Name:  Annette [name] MRN:  [id number] DOB:  1929-12-05  Summary: BP at goal <140/90 LDL slightly above goal <100  Recommendations/Changes made from today's visit: -Counseled to continue to check BP/HR daily and maintain adequate hydration -Counseled on cholesterol-lowering diet -ORDER updated lipid panel at next PCP visit, with PCP approval  Follow up plan: BP call in 3 months Pharmacist in 6 months   Subjective: Annette [name] is an 89 y.o. year old female who is a primary patient of [place], Timoteo Expose, MD.  The care coordination team was consulted for assistance with disease management and care coordination needs.    Engaged with patient by telephone for initial visit.  Recent office visits: 04/09/23 Betty [place], MD - For colitis, change lasix to 20mg  qod  12/11/2022 Betty [place] MD - Patient was seen for essential hypertension and additional concerns. Discontinue Benzonatate.    08/22/2023 Elisha Ponder LPN - Encounter for Medicare annual wellness exam  Recent consult visits: 03/08/2023 Carlus Pavlov MD (endo) -Patient was seen for Primary hyperparathyroidism and additional concerns. No medication changes.    01/21/2023 Chi Everardo All MD (pulmonary) - Patient was seen for Chronic hypoxemic respiratory failure and an additional concern. No medication changes.    11/26/2022 Waynetta Pean PA-C (urgent care) - Patient was seen for Covid and additional concerns. Started molnupiravir 200 mg.   10/23/2023 Epifanio Lesches MD (cardiology) - Patient was seen for permanent atrial fibrillation and additional concerns. Discontinued Keflex, Sensipar and Spiriva Respimat.  Hospital visits: 03/29/23 [facility] - For syncope and colitis, LOS 2 days. Start augmenitn, stop ventolin   Objective:  Lab Results  Component Value Date   CREATININE 0.81 04/09/2023   BUN 20 04/09/2023   GFR 63.89 04/09/2023    EGFR 68 10/22/2022   GFRNONAA >60 03/30/2023   GFRAA 59 (L) 07/01/2020   NA 134 (L) 04/09/2023   K 4.4 04/09/2023   CALCIUM 10.2 04/09/2023   CO2 32 04/09/2023   GLUCOSE 112 (H) 04/09/2023    Lab Results  Component Value Date/Time   HGBA1C 5.2 03/06/2022 09:42 AM   GFR 63.89 04/09/2023 08:05 AM   GFR 63.00 03/08/2023 10:01 AM    Last diabetic Eye exam: No results found for: "HMDIABEYEEXA"  Last diabetic Foot exam: No results found for: "HMDIABFOOTEX"   Lab Results  Component Value Date   CHOL 184 05/19/2021   HDL 63.10 05/19/2021   LDLCALC 105 (H) 05/19/2021   LDLDIRECT 121.2 06/17/2012   TRIG 81.0 05/19/2021   CHOLHDL 3 05/19/2021       Latest Ref Rng & Units 03/29/2023    4:19 PM 12/11/2022   10:11 AM 03/04/2022    2:04 PM  Hepatic Function  Total Protein 6.5 - 8.1 g/dL 4.8  6.8  6.1   Albumin 3.5 - 5.0 g/dL 3.1  4.3  3.7   AST 15 - 41 U/L 23  23  20    ALT 0 - 44 U/L 11  14  13    Alk Phosphatase 38 - 126 U/L 79  90  76   Total Bilirubin 0.3 - 1.2 mg/dL 0.6  0.8  1.2   Bilirubin, Direct 0.0 - 0.2 mg/dL 0.3   0.3     Lab Results  Component Value Date/Time   TSH 1.30 12/28/2020 11:50 AM   TSH 1.27 08/31/2020 12:08 PM       Latest Ref Rng & Units 04/09/2023    8:05  AM 03/30/2023    4:37 AM 03/29/2023    3:51 PM  CBC  WBC 4.0 - 10.5 K/uL 3.9  6.9  4.9   Hemoglobin 12.0 - 15.0 g/dL 86.5  78.4  69.6   Hematocrit 36.0 - 46.0 % 42.7  38.4  38.4   Platelets 150.0 - 400.0 K/uL 191.0  165  186     Lab Results  Component Value Date/Time   VD25OH 49.71 03/08/2023 10:01 AM   VD25OH 58.42 08/09/2022 09:35 AM   VITAMINB12 379 07/01/2020 11:04 AM    Clinical ASCVD: No  The ASCVD Risk score (Arnett DK, et al., 2019) failed to calculate for the following reasons:   The 2019 ASCVD risk score is only valid for ages 54 to 63       04/09/2023    7:29 AM 12/11/2022    9:35 AM 08/21/2022    2:40 PM  Depression screen PHQ 2/9  Decreased Interest 0 0 0  Down, Depressed,  Hopeless 0 0 0  PHQ - 2 Score 0 0 0     Social History   Tobacco Use  Smoking Status Former   Types: Cigarettes   Quit date: 11/12/1978   Years since quitting: 44.4  Smokeless Tobacco Never   BP Readings from Last 3 Encounters:  04/09/23 126/80  03/31/23 (!) 182/97  03/08/23 (!) 160/98   Pulse Readings from Last 3 Encounters:  04/09/23 90  03/31/23 87  03/08/23 100   Wt Readings from Last 3 Encounters:  04/09/23 137 lb 6 oz (62.3 kg)  03/08/23 137 lb 6.4 oz (62.3 kg)  01/21/23 136 lb (61.7 kg)   BMI Readings from Last 3 Encounters:  04/09/23 24.73 kg/m  03/08/23 24.73 kg/m  01/21/23 24.48 kg/m    Allergies  Allergen Reactions   Codeine Nausea And Vomiting    Medications Reviewed Today     Reviewed by Sherrill Raring, RPH (Pharmacist) on 04/10/23 at 1112  Med List Status: <None>   Medication Order Taking? Sig Documenting Provider Last Dose Status Informant  acetaminophen (TYLENOL) 500 MG tablet [id number] Yes Take 500 mg by mouth 3 (three) times daily. [provider] Taking Active Self, Pharmacy Records  apixaban (ELIQUIS) 5 MG TABS tablet [id number] Yes Take 1 tablet (5 mg total) by mouth 2 (two) times daily. Little Ishikawa, MD Taking Active Self, Pharmacy Records  cinacalcet [facility]) 30 MG tablet [id number] Yes Take 1 tablet (30 mg total) by mouth daily. Carlus Pavlov, MD Taking Active Self, Pharmacy Records  furosemide (LASIX) 40 MG tablet [id number] Yes Take 0.5 tablets (20 mg total) by mouth every other day. [place], [name], MD Taking Active            Med Note Sherrill Raring   Wed Apr 10, 2023 11:11 AM) Taking 1/2 tab qod  latanoprost (XALATAN) 0.005 % ophthalmic solution [id number] Yes INSTILL 1 DROP INTO BOTH EYES AT BEDTIME  Patient taking differently: Place 1 drop into both eyes at bedtime.   Gordy Savers, MD Taking Active Self, Pharmacy Records  magnesium oxide (MAG-OX) 400 (240 Mg) MG tablet [id number] Yes TAKE 1 TABLET  EVERY DAY Little Ishikawa, MD Taking Active Self, Pharmacy Records  omeprazole [facility]) 40 MG capsule [id number] Yes TAKE 1 CAPSULE EVERY DAY [place], [name], MD Taking Active Self, Pharmacy Records  OXYGEN [id number] Yes Inhale 2 mLs into the lungs daily. [provider] Taking Active Self, Pharmacy Records  pravastatin (PRAVACHOL) 20 MG tablet [id number] Yes  Take 1 tablet (20 mg total) by mouth daily. [place], [name], MD Taking Active Self, Pharmacy Records  vitamin B-12 (CYANOCOBALAMIN) 1000 MCG tablet [id number] Yes Take 1,000 mcg by mouth 2 (two) times a week. Tuesday and thursday [provider] Taking Active Self, Pharmacy Records  VITAMIN D PO [id number] Yes Take 1,000 Units by mouth at bedtime. [provider] Taking Active Self, Pharmacy Records            SDOH:  (Social Determinants of Health) assessments and interventions performed: Yes SDOH Interventions    Flowsheet Row Care Coordination from 04/10/2023 in [facility] Clinical Support from 08/21/2022 in [facility] [place]  SDOH Interventions    Food Insecurity Interventions Intervention Not Indicated Intervention Not Indicated  Housing Interventions Intervention Not Indicated --  Transportation Interventions -- Intervention Not Indicated  Financial Strain Interventions -- Intervention Not Indicated  Physical Activity Interventions -- Patient Refused, Other (Comments)  Stress Interventions -- Intervention Not Indicated       Medication Assistance: None required.  Patient affirms current coverage meets needs.  Medication Access: Name and location of current pharmacy:  [facility] - [place], [place] - [address] [place] [address] Phone: [phone] Fax: [phone]  [facility] [address] [place] [address] Phone: [phone] Fax:  [phone]  [id number] [place], [place] - [address] AT [facility] OF [address] [place] [address] Phone: [phone] Fax: [phone]  [facility] #[id number] [place], [place] - [address] AT [place] [facility] OF [facility] [address] & [facility] [address] [place] [address] Phone: [phone] Fax: [phone]  Within the past 30 days, how often has patient missed a dose of medication? None Is a pillbox or other method used to improve adherence? Yes  Factors that may affect medication adherence? no barriers identified Are meds synced by current pharmacy? No  Are meds delivered by current pharmacy? Yes  Does patient experience delays in picking up medications due to transportation concerns? No   Compliance/Adherence/Medication fill history: Care Gaps: AWV - completed 08/21/2022 Last BP - 160/98 on 03/08/2023 Shingrix - never done Covid - overdue  Star-Rating Drugs: Pravastatin 20 mg - last filled 02/08/2023 90 DS at [facility]    Assessment/Plan Hyperlipidemia: (LDL goal < 100) -Uncontrolled -Current treatment: Pravastatin 20mg  1 qd Appropriate, Query Effective -Medications previously tried: None  -Current dietary patterns: avoids fried foods -Current exercise habits: not discussed -Educated on Cholesterol goals;  Benefits of statin for ASCVD risk reduction; Importance of limiting foods high in cholesterol; -Recommended to continue current medication  Atrial Fibrillation (Goal: prevent stroke and major bleeding) -Controlled -CHADSVASC: 6 -Current treatment: Eliquis 5mg  BID Appropriate, Effective, Safe, Accessible -Medications previously tried: Metoprolol -Home BP and HR readings: see below  -Counseled on increased risk of stroke due to Afib and benefits of anticoagulation for stroke prevention; importance of adherence to anticoagulant exactly as prescribed; bleeding risk associated with Eliquis and  importance of self-monitoring for signs/symptoms of bleeding; avoidance of NSAIDs due to increased bleeding risk with anticoagulants; importance of regular laboratory monitoring; -Recommended to continue current medication  Heart Failure (Goal: manage symptoms and prevent exacerbations) -Controlled -Last ejection fraction: 60-65% (Date: 03/30/23) -HF type: HFpEF (EF > 50%) -NYHA Class: III (marked limitation of activity) -AHA HF Stage: C (Heart disease and symptoms present) -Current treatment: Lasix 40mg  1/2 qod (switched  to qod by PCP 04/09/23) Appropriate, Effective, Safe, Accessible -Medications previously tried: Metoprolol  -Current home BP/HR readings: 135/80 HR 76 this morning -Current home daily weights: 137.5 lbs -Current dietary habits: cup of coffee in the morning, 4 12 oz of morning -Current exercise habits: not discussed -Educated on Benefits of medications for managing symptoms and prolonging life Proper diuretic administration and potassium supplementation -Recommended to continue current medication  GERD (Goal: Prevent ulcers and gastric pain) -Not assessed today -Current treatment  Omeprazole 40mg  1 qam Appropriate, Effective, Safe, Accessible   Primary hyperparathyroidism  -Not assessed today -Current treatment  Sensipar 30mg  1 qd Appropriate, Effective, Safe, Accessible  OTC  -Current treatment  Acetaminophen 500mg  1 TID Appropriate, Effective, Safe, Accessible Mag-Ox 400mg  1 qd Appropriate, Effective, Safe, Accessible Vit B12 on Tues and Thurs Appropriate, Effective, Safe, Accessible Vit D 1000 units at bedtime Appropriate, Effective, Safe, Accessible   Sherrill Raring Clinical Pharmacist [phone]

## 2023-04-05 NOTE — Progress Notes (Unsigned)
HPI:  [name] is a 89 y.o. female, who is here today to follow on recent hospital visit.  Review of Systems See other pertinent positives and negatives in HPI.  Current Outpatient Medications on File Prior to Visit  Medication Sig Dispense Refill   acetaminophen (TYLENOL) 500 MG tablet Take 500 mg by mouth 3 (three) times daily.     amoxicillin-clavulanate (AUGMENTIN) 500-125 MG tablet Take 1 tablet by mouth 3 (three) times daily. 15 tablet 0   apixaban (ELIQUIS) 5 MG TABS tablet Take 1 tablet (5 mg total) by mouth 2 (two) times daily. 60 tablet 0   cinacalcet (SENSIPAR) 30 MG tablet Take 1 tablet (30 mg total) by mouth daily. 90 tablet 1   furosemide (LASIX) 40 MG tablet TAKE 1 TABLET EVERY DAY 90 tablet 2   latanoprost (XALATAN) 0.005 % ophthalmic solution INSTILL 1 DROP INTO BOTH EYES AT BEDTIME (Patient taking differently: Place 1 drop into both eyes at bedtime.) 7.5 mL 3   magnesium oxide (MAG-OX) 400 (240 Mg) MG tablet TAKE 1 TABLET EVERY DAY 90 tablet 3   omeprazole (PRILOSEC) 40 MG capsule TAKE 1 CAPSULE EVERY DAY 90 capsule 3   OXYGEN Inhale 2 mLs into the lungs daily.     pravastatin (PRAVACHOL) 20 MG tablet Take 1 tablet (20 mg total) by mouth daily. 90 tablet 2   vitamin B-12 (CYANOCOBALAMIN) 1000 MCG tablet Take 1,000 mcg by mouth 2 (two) times a week. Tuesday and thursday     VITAMIN D PO Take 1,000 Units by mouth at bedtime.     No current facility-administered medications on file prior to visit.    Past Medical History:  Diagnosis Date   DVT (deep venous thrombosis) (HCC)    GERD 10/03/2007   HYPERTENSION 10/03/2007   OSTEOPENIA 02/03/2008   Primary hyperparathyroidism (HCC) 10/03/2007   Allergies  Allergen Reactions   Codeine Nausea And Vomiting    Social History   Socioeconomic History   Marital status: Widowed    Spouse name: Not on file   Number of children: Not on file   Years of education: Not on file   Highest education level: Not on  file  Occupational History   Occupation: retired  Tobacco Use   Smoking status: Former    Types: Cigarettes    Quit date: 11/12/1978    Years since quitting: 44.4   Smokeless tobacco: Never  Vaping Use   Vaping Use: Never used  Substance and Sexual Activity   Alcohol use: No   Drug use: No   Sexual activity: Not Currently  Other Topics Concern   Not on file  Social History Narrative   Pt states her son and daughter in law live with her   Social Determinants of Health   Financial Resource Strain: Low Risk  (08/21/2022)   Overall Financial Resource Strain (CARDIA)    Difficulty of Paying Living Expenses: Not hard at all  Food Insecurity: No Food Insecurity (03/30/2023)   Hunger Vital Sign    Worried About Running Out of Food in the Last Year: Never true    Ran Out of Food in the Last Year: Never true  Transportation Needs: No Transportation Needs (03/30/2023)   PRAPARE - Administrator, Civil Service (Medical): No    Lack of Transportation (Non-Medical): No  Physical Activity: Inactive (08/21/2022)   Exercise Vital Sign    Days of Exercise per Week: 0 days    Minutes of Exercise per  Session: 0 min  Stress: No Stress Concern Present (08/21/2022)   [employer] of Occupational Health - Occupational Stress Questionnaire    Feeling of Stress : Not at all  Social Connections: Moderately Isolated (07/12/2021)   Social Connection and Isolation Panel [NHANES]    Frequency of Communication with Friends and Family: More than three times a week    Frequency of Social Gatherings with Friends and Family: Never    Attends Religious Services: More than 4 times per year    Active Member of [employer] or Organizations: No    Attends Banker Meetings: Never    Marital Status: Widowed    There were no vitals filed for this visit. There is no height or weight on file to calculate BMI.  Physical Exam  ASSESSMENT AND PLAN:  There are no diagnoses linked to this  encounter.  No orders of the defined types were placed in this encounter.   No problem-specific Assessment & Plan notes found for this encounter.   No follow-ups on file.  Aryan Sparks G. [place], MD  [facility]. [place] office.

## 2023-04-09 ENCOUNTER — Encounter: Payer: Self-pay | Admitting: Family Medicine

## 2023-04-09 ENCOUNTER — Ambulatory Visit (INDEPENDENT_AMBULATORY_CARE_PROVIDER_SITE_OTHER): Payer: Medicare HMO | Admitting: Family Medicine

## 2023-04-09 VITALS — BP 126/80 | HR 90 | Temp 97.9°F | Resp 16 | Ht 62.5 in | Wt 137.4 lb

## 2023-04-09 DIAGNOSIS — K529 Noninfective gastroenteritis and colitis, unspecified: Secondary | ICD-10-CM | POA: Diagnosis not present

## 2023-04-09 DIAGNOSIS — I5032 Chronic diastolic (congestive) heart failure: Secondary | ICD-10-CM | POA: Diagnosis not present

## 2023-04-09 DIAGNOSIS — I4819 Other persistent atrial fibrillation: Secondary | ICD-10-CM | POA: Diagnosis not present

## 2023-04-09 DIAGNOSIS — J9611 Chronic respiratory failure with hypoxia: Secondary | ICD-10-CM | POA: Diagnosis not present

## 2023-04-09 DIAGNOSIS — R55 Syncope and collapse: Secondary | ICD-10-CM | POA: Diagnosis not present

## 2023-04-09 LAB — BASIC METABOLIC PANEL
BUN: 20 mg/dL (ref 6–23)
CO2: 32 mEq/L (ref 19–32)
Calcium: 10.2 mg/dL (ref 8.4–10.5)
Chloride: 94 mEq/L — ABNORMAL LOW (ref 96–112)
Creatinine, Ser: 0.81 mg/dL (ref 0.40–1.20)
GFR: 63.89 mL/min (ref >60.00)
Glucose, Bld: 112 mg/dL — ABNORMAL HIGH (ref 70–99)
Potassium: 4.4 mEq/L (ref 3.5–5.1)
Sodium: 134 mEq/L — ABNORMAL LOW (ref 135–145)

## 2023-04-09 LAB — CBC
HCT: 42.7 % (ref 36.0–46.0)
Hemoglobin: 14.2 g/dL (ref 12.0–15.0)
MCHC: 33.2 g/dL (ref 30.0–36.0)
MCV: 93.6 fl (ref 78.0–100.0)
Platelets: 191 10*3/uL (ref 150.0–400.0)
RBC: 4.57 Mil/uL (ref 3.87–5.11)
RDW: 13.3 % (ref 11.5–15.5)
WBC: 3.9 10*3/uL — ABNORMAL LOW (ref 4.0–10.5)

## 2023-04-09 MED ORDER — FUROSEMIDE 40 MG PO TABS
20.0000 mg | ORAL_TABLET | ORAL | 2 refills | Status: DC
Start: 2023-04-09 — End: 2023-04-15

## 2023-04-09 NOTE — Assessment & Plan Note (Signed)
Some of her chronic health problem could have been contributing factors as well as hypotension and dehydration. Head and neck CTA  negative CTA of the head and neck. No large vessel occlusion or other emergent finding. No hemodynamically significant or correctable stenosis. Furosemide frequency decreased today. Continue adequate hydration. Instructed about warning signs.

## 2023-04-09 NOTE — Assessment & Plan Note (Signed)
She is on 3 LPM of supplemental O2, recommend decreasing to 2 LPM. Goal O2 sat > 88%. Following with pulmonologist.

## 2023-04-09 NOTE — Assessment & Plan Note (Signed)
Euvolemic. Recommend trying decreasing Furosemide 20 mg from daily to every other day.

## 2023-04-09 NOTE — Assessment & Plan Note (Addendum)
Completed abx treatment. Symptoms have resolved. No further work-up is necessary at this time.

## 2023-04-09 NOTE — Patient Instructions (Addendum)
A few things to remember from today's visit:  Colitis - Plan: Basic metabolic panel, CBC  Syncope, unspecified syncope type - Plan: Basic metabolic panel, CBC  Chronic diastolic (congestive) heart failure (HCC)  Chronic respiratory failure with hypoxia (HCC)  Furosemide 20 mg every other day. Rest of medications no changes. Goal for oxygen above 88%. Continue oxygen 2 liters per minute.  If you need refills for medications you take chronically, please call your pharmacy. Do not use My Chart to request refills or for acute issues that need immediate attention. If you send a my chart message, it may take a few days to be addressed, specially if I am not in the office.  Please be sure medication list is accurate. If a new problem present, please set up appointment sooner than planned today.

## 2023-04-09 NOTE — Assessment & Plan Note (Signed)
Currently on Eliquis 5 mg bid. Following with cardiologist.

## 2023-04-10 ENCOUNTER — Ambulatory Visit: Payer: Medicare HMO

## 2023-04-11 ENCOUNTER — Other Ambulatory Visit: Payer: Self-pay | Admitting: Internal Medicine

## 2023-04-11 DIAGNOSIS — M8580 Other specified disorders of bone density and structure, unspecified site: Secondary | ICD-10-CM

## 2023-04-15 ENCOUNTER — Telehealth: Payer: Self-pay

## 2023-04-15 MED ORDER — FUROSEMIDE 20 MG PO TABS: 20.0000 mg | ORAL_TABLET | ORAL | 2 refills | Status: DC

## 2023-04-15 NOTE — Telephone Encounter (Signed)
-----   Message from Sherrill Raring, [facility] sent at 04/11/2023  2:51 PM EDT ----- Regarding: Updated Rx for Lasix Pt is requesting a new prescription be sent in for her furosemide with updated dose and instructions:  Furosemide 20mg  1 tab every other day (is having a hard time splitting the 40mg  tabs)  Pharmacy Info: [facility] - [place], [place] - [address]  Phone: [phone] Fax: [phone]   Thank you, Sherrill Raring Clinical Pharmacist [phone]

## 2023-04-17 DIAGNOSIS — H40013 Open angle with borderline findings, low risk, bilateral: Secondary | ICD-10-CM | POA: Diagnosis not present

## 2023-04-30 ENCOUNTER — Encounter: Payer: Self-pay | Admitting: Family Medicine

## 2023-04-30 NOTE — Progress Notes (Unsigned)
ACUTE VISIT No chief complaint on file.  HPI: Annette [name] is a 89 y.o. female, who is here today complaining of *** HPI  Review of Systems See other pertinent positives and negatives in HPI.  Current Outpatient Medications on File Prior to Visit  Medication Sig Dispense Refill   acetaminophen (TYLENOL) 500 MG tablet Take 500 mg by mouth 3 (three) times daily.     apixaban (ELIQUIS) 5 MG TABS tablet Take 1 tablet (5 mg total) by mouth 2 (two) times daily. 60 tablet 0   cinacalcet (SENSIPAR) 30 MG tablet TAKE 1 TABLET EVERY DAY 90 tablet 1   furosemide (LASIX) 20 MG tablet Take 1 tablet (20 mg total) by mouth every other day. 45 tablet 2   latanoprost (XALATAN) 0.005 % ophthalmic solution INSTILL 1 DROP INTO BOTH EYES AT BEDTIME (Patient taking differently: Place 1 drop into both eyes at bedtime.) 7.5 mL 3   magnesium oxide (MAG-OX) 400 (240 Mg) MG tablet TAKE 1 TABLET EVERY DAY 90 tablet 3   omeprazole (PRILOSEC) 40 MG capsule TAKE 1 CAPSULE EVERY DAY 90 capsule 3   OXYGEN Inhale 2 mLs into the lungs daily.     pravastatin (PRAVACHOL) 20 MG tablet Take 1 tablet (20 mg total) by mouth daily. 90 tablet 2   vitamin B-12 (CYANOCOBALAMIN) 1000 MCG tablet Take 1,000 mcg by mouth 2 (two) times a week. Tuesday and thursday     VITAMIN D PO Take 1,000 Units by mouth at bedtime.     No current facility-administered medications on file prior to visit.    Past Medical History:  Diagnosis Date   DVT (deep venous thrombosis) (HCC)    GERD 10/03/2007   HYPERTENSION 10/03/2007   OSTEOPENIA 02/03/2008   Primary hyperparathyroidism (HCC) 10/03/2007   Allergies  Allergen Reactions   Codeine Nausea And Vomiting    Social History   Socioeconomic History   Marital status: Widowed    Spouse name: Not on file   Number of children: Not on file   Years of education: Not on file   Highest education level: Not on file  Occupational History   Occupation: retired  Tobacco Use   Smoking  status: Former    Types: Cigarettes    Quit date: 11/12/1978    Years since quitting: 44.4   Smokeless tobacco: Never  Vaping Use   Vaping Use: Never used  Substance and Sexual Activity   Alcohol use: No   Drug use: No   Sexual activity: Not Currently  Other Topics Concern   Not on file  Social History Narrative   Pt states her son and daughter in law live with her   Social Determinants of Health   Financial Resource Strain: Low Risk  (08/21/2022)   Overall Financial Resource Strain (CARDIA)    Difficulty of Paying Living Expenses: Not hard at all  Food Insecurity: No Food Insecurity (04/10/2023)   Hunger Vital Sign    Worried About Running Out of Food in the Last Year: Never true    Ran Out of Food in the Last Year: Never true  Transportation Needs: No Transportation Needs (03/30/2023)   PRAPARE - Administrator, Civil Service (Medical): No    Lack of Transportation (Non-Medical): No  Physical Activity: Inactive (08/21/2022)   Exercise Vital Sign    Days of Exercise per Week: 0 days    Minutes of Exercise per Session: 0 min  Stress: No Stress Concern Present (08/21/2022)  [employer] of Occupational Health - Occupational Stress Questionnaire    Feeling of Stress : Not at all  Social Connections: Moderately Isolated (07/12/2021)   Social Connection and Isolation Panel [NHANES]    Frequency of Communication with Friends and Family: More than three times a week    Frequency of Social Gatherings with Friends and Family: Never    Attends Religious Services: More than 4 times per year    Active Member of [employer] or Organizations: No    Attends Banker Meetings: Never    Marital Status: Widowed    There were no vitals filed for this visit. There is no height or weight on file to calculate BMI.  Physical Exam  ASSESSMENT AND PLAN: There are no diagnoses linked to this encounter.  No follow-ups on file.  Haylen Shelnutt G. [place], MD  [facility]. [place] [facility].  Discharge Instructions   None

## 2023-05-01 ENCOUNTER — Ambulatory Visit (INDEPENDENT_AMBULATORY_CARE_PROVIDER_SITE_OTHER): Payer: Medicare HMO | Admitting: Family Medicine

## 2023-05-01 ENCOUNTER — Encounter: Payer: Self-pay | Admitting: Family Medicine

## 2023-05-01 VITALS — BP 130/80 | HR 94 | Temp 97.8°F | Resp 16 | Ht 62.5 in | Wt 134.0 lb

## 2023-05-01 DIAGNOSIS — I10 Essential (primary) hypertension: Secondary | ICD-10-CM

## 2023-05-01 DIAGNOSIS — I4819 Other persistent atrial fibrillation: Secondary | ICD-10-CM | POA: Diagnosis not present

## 2023-05-01 MED ORDER — METOPROLOL TARTRATE 25 MG PO TABS
12.5000 mg | ORAL_TABLET | Freq: Two times a day (BID) | ORAL | 0 refills | Status: DC
Start: 2023-05-01 — End: 2023-05-14

## 2023-05-01 NOTE — Assessment & Plan Note (Addendum)
No rate or rhythm controlled. Metoprolol Tartrate 25 mg 1/2 tab bid added today. On Eliquis 5 mg bid. Follows with cardiologist.

## 2023-05-01 NOTE — Patient Instructions (Signed)
A few things to remember from today's visit:  Essential hypertension - Plan: metoprolol tartrate (LOPRESSOR) 25 MG tablet  Persistent atrial fibrillation with rapid ventricular response (HCC)  Today we are resuming Metoprolol tartrate 25 mg to take 1/2 tab 2 times daily. Continue monitoring blood pressure and pulse. I will see you back in July, you have an appt scheduled.  If you need refills for medications you take chronically, please call your pharmacy. Do not use My Chart to request refills or for acute issues that need immediate attention. If you send a my chart message, it may take a few days to be addressed, specially if I am not in the office.  Please be sure medication list is accurate. If a new problem present, please set up appointment sooner than planned today.

## 2023-05-01 NOTE — Assessment & Plan Note (Signed)
Today BP adequately controlled, elevated BP's at home. She agrees with resuming Metoprolol 25 mg 1/2 tab bid. Continue low salt diet. Continue monitoring BP regularly. Instructed about warning signs. Keep appt in 05/2023.

## 2023-05-14 ENCOUNTER — Telehealth: Payer: Self-pay | Admitting: Family Medicine

## 2023-05-14 ENCOUNTER — Other Ambulatory Visit: Payer: Self-pay | Admitting: Family Medicine

## 2023-05-14 DIAGNOSIS — I10 Essential (primary) hypertension: Secondary | ICD-10-CM

## 2023-05-14 MED ORDER — METOPROLOL TARTRATE 25 MG PO TABS
25.0000 mg | ORAL_TABLET | Freq: Two times a day (BID) | ORAL | 0 refills | Status: DC
Start: 2023-05-14 — End: 2023-05-15

## 2023-05-14 NOTE — Telephone Encounter (Signed)
I spoke with patient. She is aware of message below & will let us know how her BP & heart rate is. Med list updated.

## 2023-05-14 NOTE — Telephone Encounter (Signed)
She can increase Metoprolol Tartrate from 12.5 mg bid to 25 mg bid. Continue monitoring BP and HR. Thanks, BJ

## 2023-05-14 NOTE — Telephone Encounter (Signed)
I called and spoke with patient. Her blood pressures have been the following: 6/30: 173/97, 164/83 7/1: 123/70 7/2: 163/99, 169/88  Patient would like to know what to do?

## 2023-05-14 NOTE — Telephone Encounter (Signed)
Pt states her blood pressure is still elevated despite her taking her metoprolol tartrate (LOPRESSOR) 25 MG tablet as directed. Pls advise

## 2023-05-15 ENCOUNTER — Other Ambulatory Visit: Payer: Self-pay | Admitting: Family Medicine

## 2023-05-15 DIAGNOSIS — I10 Essential (primary) hypertension: Secondary | ICD-10-CM

## 2023-05-15 MED ORDER — VITAMIN B-12 1000 MCG PO TABS: 1000.0000 ug | ORAL_TABLET | ORAL | 3 refills | Status: DC

## 2023-05-20 ENCOUNTER — Telehealth: Payer: Self-pay | Admitting: Family Medicine

## 2023-05-20 NOTE — Telephone Encounter (Signed)
BP has improved, goal < 150/90. Noted some HR 40's and 50's. Losartan 25 mg daily can be added. Decrease Metoprolol tartrate from 25 mg bid to 12.5 mg bid. Continue monitoring BP and HR. BMP in 7-20 days. Thanks, BJ

## 2023-05-20 NOTE — Telephone Encounter (Signed)
Pt called to say that since she cut back on the Lasix, she has been putting on weight.  Please call Pt back to discuss.  Pt already has an OV scheduled for 06/03/23.

## 2023-05-20 NOTE — Telephone Encounter (Signed)
I called and spoke with patient. Her BP has been running as follows: 6/30 173/154 HR:55 7/1 163/97 HR: 48 7/5 146/85 HR: 64 7/6 141/81 HR: 44 7/7 132/81 HR: 51 7/8 132/77 HR: 55  Her weight has also been going up since she is only taking the Lasix every other day, but she is able to sleep through the night. 7/1 133 lbs 7/6 139 lbs

## 2023-05-21 MED ORDER — LOSARTAN POTASSIUM 25 MG PO TABS: 25.0000 mg | ORAL_TABLET | Freq: Every day | ORAL | 0 refills | Status: DC

## 2023-05-21 NOTE — Telephone Encounter (Signed)
I spoke with patient on the phone, we went over Dr. Elvis Coil recommendations. Patient verbalized understanding and has a f/u appt set for 7/22 already. Rx sent in.

## 2023-05-31 NOTE — Progress Notes (Unsigned)
HPI: Annette [name] is a 89 y.o. female with PMHx significant for chronic hypoxia on supplemental oxygen, HFpEF, atrial fibrillation on chronic anticoagulation, hypertension, GERD, hyperparathyroidism, and anxiety here today for chronic disease management.  Last seen on 05/01/2023, when BP was elevated, Metoprolol Tartrate was started. Treatment has been adjusted since then due to reports of persistent elevated BP and mild bradycardia. Currently she is on Losartan 25 mg daily, Metoprolol 12.5 mg bid,  Reports that BP's have improved. Most around 110's-130's/60-80 , occasional SBP 140's.  HR mid 50's-60's.  Negative for unusual or severe headache, visual changes, exertional chest pain, worsening dyspnea,  focal weakness, or edema. Palpitations when she has to get ready to go to appts, which she attributes to anxiety and alleviated when she does "calm down." Atrial fib on Eliquis 5 mg bid.  Lab Results  Component Value Date   CREATININE 0.81 04/09/2023   BUN 20 04/09/2023   NA 134 (L) 04/09/2023   K 4.4 04/09/2023   CL 94 (L) 04/09/2023   CO2 32 04/09/2023   Lab Results  Component Value Date   ALT 11 03/29/2023   AST 23 03/29/2023   ALKPHOS 79 03/29/2023   BILITOT 0.6 03/29/2023   Hyperlipidemia and aortic atherosclerosis: She would like to have her cholesterol checked today. Currently she is on pravastatin 20 mg daily.  Lab Results  Component Value Date   CHOL 184 05/19/2021   HDL 63.10 05/19/2021   LDLCALC 105 (H) 05/19/2021   LDLDIRECT 121.2 06/17/2012   TRIG 81.0 05/19/2021   CHOLHDL 3 05/19/2021   Review of Systems  Constitutional:  Negative for chills and fever.  HENT:  Negative for nosebleeds and sore throat.   Respiratory:  Negative for cough and wheezing.   Gastrointestinal:  Negative for abdominal pain, nausea and vomiting.  Genitourinary:  Negative for decreased urine volume, dysuria and hematuria.  Neurological:  Negative for syncope and facial  asymmetry.  Psychiatric/Behavioral:  Negative for confusion and hallucinations. The patient is nervous/anxious.   See other pertinent positives and negatives in HPI.  Current Outpatient Medications on File Prior to Visit  Medication Sig Dispense Refill   acetaminophen (TYLENOL) 500 MG tablet Take 500 mg by mouth 3 (three) times daily.     apixaban (ELIQUIS) 5 MG TABS tablet Take 1 tablet (5 mg total) by mouth 2 (two) times daily. 60 tablet 0   cinacalcet (SENSIPAR) 30 MG tablet TAKE 1 TABLET EVERY DAY 90 tablet 1   cyanocobalamin (VITAMIN B12) 1000 MCG tablet Take 1 tablet (1,000 mcg total) by mouth 2 (two) times a week. Tuesday and thursday 90 tablet 3   furosemide (LASIX) 20 MG tablet Take 1 tablet (20 mg total) by mouth every other day. 45 tablet 2   latanoprost (XALATAN) 0.005 % ophthalmic solution INSTILL 1 DROP INTO BOTH EYES AT BEDTIME (Patient taking differently: Place 1 drop into both eyes at bedtime.) 7.5 mL 3   losartan (COZAAR) 25 MG tablet Take 1 tablet (25 mg total) by mouth daily. 90 tablet 0   magnesium oxide (MAG-OX) 400 (240 Mg) MG tablet TAKE 1 TABLET EVERY DAY 90 tablet 3   metoprolol tartrate (LOPRESSOR) 25 MG tablet Take 12.5 mg by mouth 2 (two) times daily.     omeprazole (PRILOSEC) 40 MG capsule TAKE 1 CAPSULE EVERY DAY 90 capsule 3   OXYGEN Inhale 2 mLs into the lungs daily.     pravastatin (PRAVACHOL) 20 MG tablet Take 1 tablet (20 mg total)  by mouth daily. 90 tablet 2   VITAMIN D PO Take 1,000 Units by mouth at bedtime.     No current facility-administered medications on file prior to visit.   Past Medical History:  Diagnosis Date   DVT (deep venous thrombosis) (HCC)    GERD 10/03/2007   HYPERTENSION 10/03/2007   OSTEOPENIA 02/03/2008   Primary hyperparathyroidism (HCC) 10/03/2007   Allergies  Allergen Reactions   Codeine Nausea And Vomiting    Social History   Socioeconomic History   Marital status: Widowed    Spouse name: Not on file   Number of  children: Not on file   Years of education: Not on file   Highest education level: Not on file  Occupational History   Occupation: retired  Tobacco Use   Smoking status: Former    Current packs/day: 0.00    Types: Cigarettes    Quit date: 11/12/1978    Years since quitting: 44.5   Smokeless tobacco: Never  Vaping Use   Vaping status: Never Used  Substance and Sexual Activity   Alcohol use: No   Drug use: No   Sexual activity: Not Currently  Other Topics Concern   Not on file  Social History Narrative   Pt states her son and daughter in law live with her   Social Determinants of Health   Financial Resource Strain: Low Risk  (08/21/2022)   Overall Financial Resource Strain (CARDIA)    Difficulty of Paying Living Expenses: Not hard at all  Food Insecurity: No Food Insecurity (04/10/2023)   Hunger Vital Sign    Worried About Running Out of Food in the Last Year: Never true    Ran Out of Food in the Last Year: Never true  Transportation Needs: No Transportation Needs (03/30/2023)   PRAPARE - Administrator, Civil Service (Medical): No    Lack of Transportation (Non-Medical): No  Physical Activity: Inactive (08/21/2022)   Exercise Vital Sign    Days of Exercise per Week: 0 days    Minutes of Exercise per Session: 0 min  Stress: No Stress Concern Present (08/21/2022)   [employer] of Occupational Health - Occupational Stress Questionnaire    Feeling of Stress : Not at all  Social Connections: Moderately Isolated (07/12/2021)   Social Connection and Isolation Panel [NHANES]    Frequency of Communication with Friends and Family: More than three times a week    Frequency of Social Gatherings with Friends and Family: Never    Attends Religious Services: More than 4 times per year    Active Member of [employer] or Organizations: No    Attends Banker Meetings: Never    Marital Status: Widowed    Vitals:   06/03/23 0911  BP: 122/80  Pulse: 67  Resp: 16   Temp: 97.8 F (36.6 C)  SpO2: 97%   Body mass index is 24.55 kg/m.  Physical Exam Vitals and nursing note reviewed.  Constitutional:      General: She is not in acute distress.    Appearance: She is well-developed.  HENT:     Head: Normocephalic and atraumatic.     Mouth/Throat:     Mouth: Mucous membranes are moist.  Eyes:     Conjunctiva/sclera: Conjunctivae normal.  Cardiovascular:     Rate and Rhythm: Normal rate. Rhythm irregular.     Heart sounds: No murmur heard.    Comments: DP pulses palpable. Pulmonary:     Effort: Pulmonary effort is normal. No  respiratory distress.     Breath sounds: Normal breath sounds.     Comments: On continues O2 supplementation per [place] 2 LPM. Abdominal:     Palpations: Abdomen is soft.     Tenderness: There is no abdominal tenderness.  Musculoskeletal:     Right lower leg: No edema.     Left lower leg: No edema.  Skin:    General: Skin is warm.     Findings: No erythema or rash.  Neurological:     Mental Status: She is alert and oriented to person, place, and time.     Comments: Unstable gait assisted with a walker.  Psychiatric:        Mood and Affect: Affect normal. Mood is anxious.   ASSESSMENT AND PLAN:  Ms. Kaelee "Hazelynn Mckenny" was seen today for medical management of chronic issues.  Diagnoses and all orders for this visit: Lab Results  Component Value Date   NA 134 (L) 06/03/2023   CL 94 (L) 06/03/2023   K 3.9 06/03/2023   CO2 31 06/03/2023   BUN 18 06/03/2023   CREATININE 0.73 06/03/2023   GFR 72.31 06/03/2023   CALCIUM 10.3 06/03/2023   PHOS 2.8 03/30/2023   ALBUMIN 3.1 (L) 03/29/2023   GLUCOSE 99 06/03/2023   Lab Results  Component Value Date   CHOL 128 06/03/2023   HDL 68.00 06/03/2023   LDLCALC 52 06/03/2023   LDLDIRECT 121.2 06/17/2012   TRIG 41.0 06/03/2023   CHOLHDL 2 06/03/2023    Hyperlipidemia, unspecified hyperlipidemia type Assessment & Plan: Last LDL 105 in 05/2021. Continue pravastatin  20 mg daily.  Orders: -     Lipid panel; Future  Essential hypertension Assessment & Plan: BP seems to be better controlled, has had some mildly elevated SBP at home, 140s to 150s, but in general BPs are at goal. Continue metoprolol titrate 25 mg 1/2 tablet twice daily and losartan 25 mg daily. We discussed some side effects of medication. Continue monitoring BP regularly. She has an appointment with cardiologist in 10/2023. Follow-up in 6 months, before if needed.  Orders: -     Basic metabolic panel; Future  Atherosclerosis of aorta [facility]) Assessment & Plan: Problem was seen on chest CT in 10/2019. Continue pravastatin 20 mg daily. She is on chronic anticoagulation.  Orders: -     Lipid panel; Future  Longstanding persistent atrial fibrillation [facility]) Assessment & Plan: Rate controlled, has had some HR's in the 50's at home but most > 60/min. Continue metoprolol titrate 25 mg 1/2 tablet twice daily and Eliquis 5 mg twice daily. We discussed some side effects of medication. Continue monitoring HR at home. Keep appointment with cardiologist in 10/2023.   Return in about 6 months (around 12/04/2023) for chronic problems.  Jahna Liebert G. [place], MD  [facility]. [place] office.

## 2023-05-31 NOTE — Patient Instructions (Incomplete)
A few things to remember from today's visit:  Essential hypertension  If you need refills for medications you take chronically, please call your pharmacy. Do not use My Chart to request refills or for acute issues that need immediate attention. If you send a my chart message, it may take a few days to be addressed, specially if I am not in the office.  Please be sure medication list is accurate. If a new problem present, please set up appointment sooner than planned today.

## 2023-06-03 ENCOUNTER — Encounter: Payer: Self-pay | Admitting: Family Medicine

## 2023-06-03 ENCOUNTER — Ambulatory Visit (INDEPENDENT_AMBULATORY_CARE_PROVIDER_SITE_OTHER): Payer: Medicare HMO | Admitting: Family Medicine

## 2023-06-03 VITALS — BP 122/80 | HR 67 | Temp 97.8°F | Resp 16 | Ht 62.5 in | Wt 136.4 lb

## 2023-06-03 DIAGNOSIS — I10 Essential (primary) hypertension: Secondary | ICD-10-CM | POA: Diagnosis not present

## 2023-06-03 DIAGNOSIS — I700 Atherosclerosis of aorta: Secondary | ICD-10-CM

## 2023-06-03 DIAGNOSIS — I4811 Longstanding persistent atrial fibrillation: Secondary | ICD-10-CM | POA: Diagnosis not present

## 2023-06-03 DIAGNOSIS — E785 Hyperlipidemia, unspecified: Secondary | ICD-10-CM | POA: Diagnosis not present

## 2023-06-03 LAB — BASIC METABOLIC PANEL
BUN: 18 mg/dL (ref 6–23)
CO2: 31 mEq/L (ref 19–32)
Calcium: 10.3 mg/dL (ref 8.4–10.5)
Chloride: 94 mEq/L — ABNORMAL LOW (ref 96–112)
Creatinine, Ser: 0.73 mg/dL (ref 0.40–1.20)
GFR: 72.31 mL/min (ref >60.00)
Glucose, Bld: 99 mg/dL (ref 70–99)
Potassium: 3.9 mEq/L (ref 3.5–5.1)
Sodium: 134 mEq/L — ABNORMAL LOW (ref 135–145)

## 2023-06-03 LAB — LIPID PANEL
Cholesterol: 128 mg/dL (ref 0–200)
HDL: 68 mg/dL (ref >39.00)
LDL Cholesterol: 52 mg/dL (ref 0–99)
NonHDL: 60.38
Total CHOL/HDL Ratio: 2
Triglycerides: 41 mg/dL (ref 0.0–149.0)
VLDL: 8.2 mg/dL (ref 0.0–40.0)

## 2023-06-03 NOTE — Assessment & Plan Note (Signed)
Rate controlled, has had some HR's in the 50's at home but most > 60/min. Continue metoprolol titrate 25 mg 1/2 tablet twice daily and Eliquis 5 mg twice daily. We discussed some side effects of medication. Continue monitoring HR at home. Keep appointment with cardiologist in 10/2023.

## 2023-06-03 NOTE — Assessment & Plan Note (Signed)
Problem was seen on chest CT in 10/2019. Continue pravastatin 20 mg daily. She is on chronic anticoagulation.

## 2023-06-03 NOTE — Assessment & Plan Note (Signed)
BP seems to be better controlled, has had some mildly elevated SBP at home, 140s to 150s, but in general BPs are at goal. Continue metoprolol titrate 25 mg 1/2 tablet twice daily and losartan 25 mg daily. We discussed some side effects of medication. Continue monitoring BP regularly. She has an appointment with cardiologist in 10/2023. Follow-up in 6 months, before if needed.

## 2023-06-03 NOTE — Assessment & Plan Note (Signed)
Last LDL 105 in 05/2021. Continue pravastatin 20 mg daily.

## 2023-07-02 ENCOUNTER — Other Ambulatory Visit: Payer: Self-pay | Admitting: Family Medicine

## 2023-07-16 ENCOUNTER — Other Ambulatory Visit: Payer: Self-pay | Admitting: Family Medicine

## 2023-08-08 ENCOUNTER — Ambulatory Visit: Payer: Medicare HMO | Admitting: Family Medicine

## 2023-08-08 VITALS — BP 130/73 | HR 65 | Wt 134.0 lb

## 2023-08-08 DIAGNOSIS — Z0000 Encounter for general adult medical examination without abnormal findings: Secondary | ICD-10-CM

## 2023-08-08 NOTE — Patient Instructions (Signed)
I really enjoyed getting to talk with you today! I am available on Tuesdays and Thursdays for virtual visits if you have any questions or concerns, or if I can be of any further assistance.   CHECKLIST FROM ANNUAL WELLNESS VISIT:  -Follow up (please call to schedule if not scheduled after visit):   -yearly for annual wellness visit with primary care office  Here is a list of your preventive care/health maintenance measures and the plan for each if any are due:  PLAN For any measures below that may be due:  -can get your flu and covid shots at the pharmacy if wish  Health Maintenance  Topic Date Due   COVID-19 Vaccine (4 - 2023-24 season) 08/24/2023 (Originally 07/14/2023)   Zoster Vaccines- Shingrix (1 of 2) 11/15/2023 (Originally [birth year]24/1982)   INFLUENZA VACCINE  02/10/2024 (Originally 06/13/2023)   Medicare Annual Wellness (AWV)  08/07/2024   DTaP/Tdap/Td (2 - Td or Tdap) 08/31/2030   Pneumonia Vaccine 36+ Years old  Completed   HPV VACCINES  Aged Out   DEXA SCAN  Discontinued    -See a dentist at least yearly  -Get your eyes checked and then per your eye specialist's recommendations  -Other issues addressed today:   -I have included below further information regarding a healthy whole foods based diet, physical activity guidelines for adults, stress management and opportunities for social connections. I hope you find this information useful.   -----------------------------------------------------------------------------------------------------------------------------------------------------------------------------------------------------------------------------------------------------------  NUTRITION: -eat real food: lots of colorful vegetables (half the plate) and fruits -5-7 servings of vegetables and fruits per day (fresh or steamed is best), exp. 2 servings of vegetables with lunch and dinner and 2 servings of fruit per day. Berries and greens such as kale and collards are  great choices.  -consume on a regular basis: whole grains (make sure first ingredient on label contains the word "whole"), fresh fruits, fish, nuts, seeds, healthy oils (such as olive oil, avocado oil, grape seed oil) -may eat small amounts of dairy and lean meat on occasion, but avoid processed meats such as ham, bacon, lunch meat, etc. -drink water -try to avoid fast food and pre-packaged foods, processed meat -most experts advise limiting sodium to < 2300mg  per day, should limit further is any chronic conditions such as high blood pressure, heart disease, diabetes, etc. The American Heart Association advised that < 1500mg  is is ideal -try to avoid foods that contain any ingredients with names you do not recognize  -try to avoid sugar/sweets (except for the natural sugar that occurs in fresh fruit) -try to avoid sweet drinks -try to avoid white rice, white bread, pasta (unless whole grain), white or yellow potatoes  EXERCISE GUIDELINES FOR ADULTS: -if you wish to increase your physical activity, do so gradually and with the approval of your doctor -STOP and seek medical care immediately if you have any chest pain, chest discomfort or trouble breathing when starting or increasing exercise  -move and stretch your body, legs, feet and arms when sitting for long periods -Physical activity guidelines for optimal health in adults: -least 150 minutes per week of aerobic exercise (can talk, but not sing) once approved by your doctor, 20-30 minutes of sustained activity or two 10 minute episodes of sustained activity every day.  -resistance training at least 2 days per week if approved by your doctor -balance exercises 3+ days per week:   Stand somewhere where you have something sturdy to hold onto if you lose balance.    1) lift up on  toes, start with 5x per day and work up to 20x   2) stand and lift on leg straight out to the side so that foot is a few inches of the floor, start with 5x each side and  work up to 20x each side   3) stand on one foot, start with 5 seconds each side and work up to 20 seconds on each side  If you need ideas or help with getting more active:  -Silver sneakers https://tools.silversneakers.com  -Walk with a Doc: http://www.duncan-williams.com/  -try to include resistance (weight lifting/strength building) and balance exercises twice per week: or the following link for ideas: http://castillo-powell.com/  BuyDucts.dk  STRESS MANAGEMENT: -can try meditating, or just sitting quietly with deep breathing while intentionally relaxing all parts of your body for 5 minutes daily -if you need further help with stress, anxiety or depression please follow up with your primary doctor or contact the wonderful folks at [facility]: [phone]  SOCIAL CONNECTIONS: -options in [place] if you wish to engage in more social and exercise related activities:  -Silver sneakers https://tools.silversneakers.com  -Walk with a Doc: http://www.duncan-williams.com/  -Check out the [facility] Active Adults 50+ section on the [place] [employer] (hiking clubs, book clubs, cards and games, chess, exercise classes, aquatic classes and much more) - see the website for details: https://www.[place].gov/departments/parks-recreation/active-adults50  -YouTube has lots of exercise videos for different ages and abilities as well  -[facility] (a variety of indoor and outdoor inperson activities for adults). [phone]. [address]  -Virtual Online Classes (a variety of topics): see seniorplanet.org or call ([phone])366-7484  -consider volunteering at a school, hospice center, church, senior center or elsewhere

## 2023-08-08 NOTE — Progress Notes (Signed)
PATIENT CHECK-IN and HEALTH RISK ASSESSMENT QUESTIONNAIRE:  -completed by phone/video for upcoming Medicare Preventive Visit  Pre-Visit Check-in: 1)Vitals (height, wt, BP, etc) - record in vitals section for visit on day of visit Request home vitals (wt, BP, etc.) and enter into vitals, THEN update Vital Signs SmartPhrase below at the top of the HPI. See below.  2)Review and Update Medications, Allergies PMH, Surgeries, Social history in Epic 3)Hospitalizations in the last year with date/reason? In may, now recovered, colitis  4)Review and Update Care Team (patient's specialists) in Epic 5) Complete PHQ9 in Epic  6) Complete Fall Screening in Epic 7)Review all Health Maintenance Due and order under PCP if not done.  8)Medicare Wellness Questionnaire: Answer theses question about your habits: Do you drink alcohol? no Have you ever smoked? no Do you use an illicit drugs?no Do you exercises? Uses walker, she does chair exercises and does a chart of exercises on and off.  Son gets the groceries, trying to avoid salt in diet, she does the cooking, she eats chicken, burgers, veggies (some fresh from the garden)  Beverages: water She stays busy doing all of the cooking in the house  Answer theses question about you: Can you perform most household chores? yes Do you find it hard to follow a conversation in a noisy room?no Do you often ask people to speak up or repeat themselves?no Do you feel that you have a problem with memory?some, son and daughter in law live with her and take care of her Do you feel safe at home? yes Last dentist visit? Goes on a regular basis Do you need assistance with any of the following: Please note if so -  Driving? Son does the driving, lives with son and daughter in law  Feeding yourself?  Getting from bed to chair?  Getting to the toilet?  Bathing or showering?  Dressing yourself?  Managing money?  Climbing a flight of stairs  Preparing meals?  Do you  have Advanced Directives in place (Living Will, Healthcare Power or Attorney)?  yes   Last eye Exam and location? Goes once a year   Do you currently use prescribed or non-prescribed narcotic or opioid pain medications? no  Do you have a history or close family history of breast, ovarian, tubal or peritoneal cancer or a family member with BRCA (breast cancer susceptibility 1 and 2) gene mutations?  Request home vitals (wt, BP, etc.) and enter into vitals, THEN update Vital Signs SmartPhrase below at the top of the HPI. See below.     ----------------------------------------------------------------------------------------------------------------------------------------------------------------------------------------------------------------------  Because this visit was a virtual/telehealth visit, some criteria may be missing or patient reported. Any vitals not documented were not able to be obtained and vitals that have been documented are patient reported.    MEDICARE ANNUAL PREVENTIVE VISIT WITH PROVIDER: (Welcome to Medicare, initial annual wellness or annual wellness exam)  Virtual Visit via Phone Note  I connected with [name] on 08/08/23 by phone  and verified that I am speaking with the correct person using two identifiers.  Location patient: home Location provider:work or home office Persons participating in the virtual visit: patient, provider  Concerns and/or follow up today: reports is doing good on the blood pressure medication changes. No concerns today.    See HM section in Epic for other details of completed HM.    ROS: negative for report of fevers, unintentional weight loss, vision changes, vision loss, hearing loss or change, chest pain, sob, hemoptysis, melena, hematochezia,  hematuria, falls, bleeding or bruising, thoughts of suicide or self harm, memory loss  Patient-completed extensive health risk assessment - reviewed and discussed with the  patient: See Health Risk Assessment completed with patient prior to the visit either above or in recent phone note. This was reviewed in detailed with the patient today and appropriate recommendations, orders and referrals were placed as needed per Summary below and patient instructions.   Review of Medical History: -PMH, PSH, Family History and current specialty and care providers reviewed and updated and listed below   Patient Care Team: [place], [name], MD as PCP - General (Family Medicine) Little Ishikawa, MD as PCP - Cardiology (Cardiology) Verner Chol, [facility] (Inactive) as Pharmacist (Pharmacist) Sherrill Raring, [facility] (Pharmacist)   Past Medical History:  Diagnosis Date   DVT (deep venous thrombosis) (HCC)    GERD 10/03/2007   HYPERTENSION 10/03/2007   OSTEOPENIA 02/03/2008   Primary hyperparathyroidism (HCC) 10/03/2007    Past Surgical History:  Procedure Laterality Date   ABDOMINAL HYSTERECTOMY      Social History   Socioeconomic History   Marital status: Widowed    Spouse name: Not on file   Number of children: Not on file   Years of education: Not on file   Highest education level: Not on file  Occupational History   Occupation: retired  Tobacco Use   Smoking status: Former    Current packs/day: 0.00    Types: Cigarettes    Quit date: 11/12/1978    Years since quitting: 44.7   Smokeless tobacco: Never  Vaping Use   Vaping status: Never Used  Substance and Sexual Activity   Alcohol use: No   Drug use: No   Sexual activity: Not Currently  Other Topics Concern   Not on file  Social History Narrative   Pt states her son and daughter in law live with her   Social Determinants of Health   Financial Resource Strain: Low Risk  (08/21/2022)   Overall Financial Resource Strain (CARDIA)    Difficulty of Paying Living Expenses: Not hard at all  Food Insecurity: No Food Insecurity (04/10/2023)   Hunger Vital Sign    Worried About Running Out of Food  in the Last Year: Never true    Ran Out of Food in the Last Year: Never true  Transportation Needs: No Transportation Needs (03/30/2023)   PRAPARE - Administrator, Civil Service (Medical): No    Lack of Transportation (Non-Medical): No  Physical Activity: Inactive (08/21/2022)   Exercise Vital Sign    Days of Exercise per Week: 0 days    Minutes of Exercise per Session: 0 min  Stress: No Stress Concern Present (08/21/2022)   [employer] of Occupational Health - Occupational Stress Questionnaire    Feeling of Stress : Not at all  Social Connections: Moderately Isolated (07/12/2021)   Social Connection and Isolation Panel [NHANES]    Frequency of Communication with Friends and Family: More than three times a week    Frequency of Social Gatherings with Friends and Family: Never    Attends Religious Services: More than 4 times per year    Active Member of [employer] or Organizations: No    Attends Banker Meetings: Never    Marital Status: Widowed  Intimate Partner Violence: Not At Risk (03/30/2023)   Humiliation, Afraid, Rape, and Kick questionnaire    Fear of Current or Ex-Partner: No    Emotionally Abused: No    Physically  Abused: No    Sexually Abused: No    Family History  Problem Relation Age of Onset   Hypercalcemia Neg Hx     Current Outpatient Medications on File Prior to Visit  Medication Sig Dispense Refill   acetaminophen (TYLENOL) 500 MG tablet Take 500 mg by mouth 3 (three) times daily.     apixaban (ELIQUIS) 5 MG TABS tablet Take 1 tablet (5 mg total) by mouth 2 (two) times daily. 60 tablet 0   cinacalcet (SENSIPAR) 30 MG tablet TAKE 1 TABLET EVERY DAY 90 tablet 1   cyanocobalamin (VITAMIN B12) 1000 MCG tablet Take 1 tablet (1,000 mcg total) by mouth 2 (two) times a week. Tuesday and thursday 90 tablet 3   furosemide (LASIX) 20 MG tablet Take 1 tablet (20 mg total) by mouth every other day. 45 tablet 2   latanoprost (XALATAN) 0.005 %  ophthalmic solution INSTILL 1 DROP INTO BOTH EYES AT BEDTIME (Patient taking differently: Place 1 drop into both eyes at bedtime.) 7.5 mL 3   losartan (COZAAR) 25 MG tablet TAKE 1 TABLET (25 MG TOTAL) BY MOUTH DAILY. 90 tablet 3   magnesium oxide (MAG-OX) 400 (240 Mg) MG tablet TAKE 1 TABLET EVERY DAY 90 tablet 3   metoprolol tartrate (LOPRESSOR) 25 MG tablet Take 12.5 mg by mouth 2 (two) times daily.     omeprazole (PRILOSEC) 40 MG capsule TAKE 1 CAPSULE EVERY DAY 90 capsule 3   OXYGEN Inhale 2 mLs into the lungs daily.     pravastatin (PRAVACHOL) 20 MG tablet TAKE 1 TABLET EVERY DAY 90 tablet 3   VITAMIN D PO Take 1,000 Units by mouth at bedtime.     No current facility-administered medications on file prior to visit.    Allergies  Allergen Reactions   Codeine Nausea And Vomiting       Physical Exam Vitals requested from patient and listed below if patient had equipment and was able to obtain at home for this virtual visit: Vitals:   08/08/23 1302  BP: 130/73  Pulse: 65  SpO2: 99%   Estimated body mass index is 24.12 kg/m as calculated from the following:   Height as of 06/03/23: 5' 2.5" (1.588 m).   Weight as of this encounter: 134 lb (60.8 kg).  EKG (optional): deferred due to virtual visit  GENERAL: alert, oriented, no acute distress detected, full vision exam deferred due to pandemic and/or virtual encounter  PSYCH/NEURO: pleasant and cooperative, no obvious depression or anxiety, speech and thought processing grossly intact, Cognitive function grossly intact  Flowsheet Row Office Visit from 05/01/2023 in [facility]  PHQ-9 Total Score 5           08/08/2023    1:06 PM 05/01/2023    7:31 AM 04/09/2023    7:29 AM 12/11/2022    9:35 AM 08/21/2022    2:40 PM  Depression screen PHQ 2/9  Decreased Interest 0 0 0 0 0  Down, Depressed, Hopeless 0 0 0 0 0  PHQ - 2 Score 0 0 0 0 0  Altered sleeping  3     Tired, decreased energy  1      Change in appetite  0     Feeling bad or failure about yourself   0     Trouble concentrating  1     Moving slowly or fidgety/restless  0     Suicidal thoughts  0     PHQ-9 Score  5  Difficult doing work/chores  Somewhat difficult          06/05/2022    9:23 AM 06/11/2022   12:23 PM 08/21/2022    2:39 PM 12/11/2022    9:35 AM 08/08/2023    1:06 PM  Fall Risk  Falls in the past year? 0  0 0   Was there an injury with Fall? 0  0 0 0  Fall Risk Category Calculator 0  0 0   Fall Risk Category (Retired) Low  Low    (RETIRED) Patient Fall Risk Level High fall risk High fall risk Moderate fall risk    Patient at Risk for Falls Due to History of fall(s)  Impaired balance/gait;Impaired mobility;Medication side effect Other (Comment);Impaired balance/gait;Impaired mobility   Fall risk Follow up Falls evaluation completed  Falls prevention discussed;Falls evaluation completed;Education provided Falls evaluation completed      SUMMARY AND PLAN:  Encounter for Medicare annual wellness exam  Discussed applicable health maintenance/preventive health measures and advised and referred or ordered per patient preferences: -declined covid and flu shots, sometimes get flu shots but does not go out much anymore Health Maintenance  Topic Date Due   COVID-19 Vaccine (4 - 2023-24 season) 08/24/2023 (Originally 07/14/2023)   Zoster Vaccines- Shingrix (1 of 2) 11/15/2023 (Originally [birth year]24/1982)   INFLUENZA VACCINE  02/10/2024 (Originally 06/13/2023)   Medicare Annual Wellness (AWV)  08/07/2024   DTaP/Tdap/Td (2 - Td or Tdap) 08/31/2030   Pneumonia Vaccine 52+ Years old  Completed   HPV VACCINES  Aged Out   DEXA SCAN  Discontinued     Education and counseling on the following was provided based on the above review of health and a plan/checklist for the patient, along with additional information discussed, was provided for the patient in the patient instructions :  -Provided  safe balance exercises  that can be done at home to improve balance and discussed exercise guidelines for adults with include balance exercises at least 3 days per week.  -Advised and counseled on a healthy lifestyle  -Reviewed patient's current diet. Advised and counseled on a whole foods based healthy diet. A summary of a healthy diet was provided in the Patient Instructions.  -reviewed patient's current physical activity level and discussed exercise guidelines for adults. Discussed ideas for safe exercise at home to assist in meeting exercise guideline recommendations in a safe and healthy way. Encouraged to remain active.  -Advise yearly dental visits at minimum and regular eye exams  Follow up: see patient instructions     Patient Instructions  I really enjoyed getting to talk with you today! I am available on Tuesdays and Thursdays for virtual visits if you have any questions or concerns, or if I can be of any further assistance.   CHECKLIST FROM ANNUAL WELLNESS VISIT:  -Follow up (please call to schedule if not scheduled after visit):   -yearly for annual wellness visit with primary care office  Here is a list of your preventive care/health maintenance measures and the plan for each if any are due:  PLAN For any measures below that may be due:  -can get your flu and covid shots at the pharmacy if wish  Health Maintenance  Topic Date Due   COVID-19 Vaccine (4 - 2023-24 season) 08/24/2023 (Originally 07/14/2023)   Zoster Vaccines- Shingrix (1 of 2) 11/15/2023 (Originally 10/24/1980)   INFLUENZA VACCINE  02/10/2024 (Originally 06/13/2023)   Medicare Annual Wellness (AWV)  08/07/2024   DTaP/Tdap/Td (2 - Td or Tdap) 08/31/2030  Pneumonia Vaccine 34+ Years old  Completed   HPV VACCINES  Aged Out   DEXA SCAN  Discontinued    -See a dentist at least yearly  -Get your eyes checked and then per your eye specialist's recommendations  -Other issues addressed today:   -I have included below further  information regarding a healthy whole foods based diet, physical activity guidelines for adults, stress management and opportunities for social connections. I hope you find this information useful.   -----------------------------------------------------------------------------------------------------------------------------------------------------------------------------------------------------------------------------------------------------------  NUTRITION: -eat real food: lots of colorful vegetables (half the plate) and fruits -5-7 servings of vegetables and fruits per day (fresh or steamed is best), exp. 2 servings of vegetables with lunch and dinner and 2 servings of fruit per day. Berries and greens such as kale and collards are great choices.  -consume on a regular basis: whole grains (make sure first ingredient on label contains the word "whole"), fresh fruits, fish, nuts, seeds, healthy oils (such as olive oil, avocado oil, grape seed oil) -may eat small amounts of dairy and lean meat on occasion, but avoid processed meats such as ham, bacon, lunch meat, etc. -drink water -try to avoid fast food and pre-packaged foods, processed meat -most experts advise limiting sodium to < 2300mg  per day, should limit further is any chronic conditions such as high blood pressure, heart disease, diabetes, etc. The American Heart Association advised that < 1500mg  is is ideal -try to avoid foods that contain any ingredients with names you do not recognize  -try to avoid sugar/sweets (except for the natural sugar that occurs in fresh fruit) -try to avoid sweet drinks -try to avoid white rice, white bread, pasta (unless whole grain), white or yellow potatoes  EXERCISE GUIDELINES FOR ADULTS: -if you wish to increase your physical activity, do so gradually and with the approval of your doctor -STOP and seek medical care immediately if you have any chest pain, chest discomfort or trouble breathing when starting  or increasing exercise  -move and stretch your body, legs, feet and arms when sitting for long periods -Physical activity guidelines for optimal health in adults: -least 150 minutes per week of aerobic exercise (can talk, but not sing) once approved by your doctor, 20-30 minutes of sustained activity or two 10 minute episodes of sustained activity every day.  -resistance training at least 2 days per week if approved by your doctor -balance exercises 3+ days per week:   Stand somewhere where you have something sturdy to hold onto if you lose balance.    1) lift up on toes, start with 5x per day and work up to 20x   2) stand and lift on leg straight out to the side so that foot is a few inches of the floor, start with 5x each side and work up to 20x each side   3) stand on one foot, start with 5 seconds each side and work up to 20 seconds on each side  If you need ideas or help with getting more active:  -Silver sneakers https://tools.silversneakers.com  -Walk with a Doc: http://www.duncan-williams.com/  -try to include resistance (weight lifting/strength building) and balance exercises twice per week: or the following link for ideas: http://castillo-powell.com/  BuyDucts.dk  STRESS MANAGEMENT: -can try meditating, or just sitting quietly with deep breathing while intentionally relaxing all parts of your body for 5 minutes daily -if you need further help with stress, anxiety or depression please follow up with your primary doctor or contact the wonderful folks at [facility]:  [phone]  SOCIAL CONNECTIONS: -options in [place] if you wish to engage in more social and exercise related activities:  -Silver sneakers https://tools.silversneakers.com  -Walk with a Doc: http://www.duncan-williams.com/  -Check out the [facility] Active Adults 50+ section on the [place] [employer] (hiking  clubs, book clubs, cards and games, chess, exercise classes, aquatic classes and much more) - see the website for details: https://www.[place].gov/departments/parks-recreation/active-adults50  -YouTube has lots of exercise videos for different ages and abilities as well  -[facility] (a variety of indoor and outdoor inperson activities for adults). 5418128858. [address]  -Virtual Online Classes (a variety of topics): see seniorplanet.org or call [phone]  -consider volunteering at a school, hospice center, church, senior center or elsewhere           Terressa Koyanagi, DO

## 2023-08-08 NOTE — Progress Notes (Signed)
 Patient unable to obtain vital signs due to telehealth visit

## 2023-09-06 ENCOUNTER — Other Ambulatory Visit: Payer: Self-pay | Admitting: Cardiology

## 2023-09-06 DIAGNOSIS — R790 Abnormal level of blood mineral: Secondary | ICD-10-CM

## 2023-09-23 ENCOUNTER — Telehealth: Payer: Self-pay | Admitting: Cardiology

## 2023-09-23 NOTE — Telephone Encounter (Signed)
  Patient is calling asking if Dr Campbell Lerner nurse will put the application for the [employer] patient assistance for medications in an envelope up front for her son to pick up on his way to work. She is hoping it can be tomorrow morning if possible

## 2023-09-23 NOTE — Telephone Encounter (Signed)
Patient identification verified by 2 forms. [name], RN   Called and spoke to patient  Informed patient Annette [name] form placed in front for pick up  Patient verbalized understanding, no questions at this time

## 2023-10-14 ENCOUNTER — Telehealth: Payer: Self-pay | Admitting: Cardiology

## 2023-10-14 NOTE — Telephone Encounter (Signed)
Called and spoke to patient. She asked that a patient assistance application be place in the mail for her. Application place in mail for patient.

## 2023-10-14 NOTE — Telephone Encounter (Signed)
Patient would like the forms for patient assistance for eliquis application to be sent to her.

## 2023-10-15 ENCOUNTER — Telehealth: Payer: Self-pay | Admitting: Cardiology

## 2023-10-15 NOTE — Telephone Encounter (Signed)
Patient calling to see if she needs to have any labs done prior to her appt. Please advise

## 2023-10-15 NOTE — Telephone Encounter (Signed)
Returned call to pt informed pt and she does not have to take with food. She will take about 6am and then 12 hours later. She will be here early for her upcoming appt on 10-31-23

## 2023-10-30 NOTE — Progress Notes (Unsigned)
Cardiology Office Note:    Date:  10/31/2023   ID:  JERELENE SWAGGERTY, DOB 03-28-1930, MRN [id number]  PCP:  [place], [name], MD  Cardiologist:  Little Ishikawa, MD  Electrophysiologist:  None   Referring MD: [place], [name], MD   Chief Complaint  Patient presents with   Atrial Fibrillation    History of Present Illness:    Annette [name] is a 89 y.o. female with a hx of hypertension, PE, primary hyperparathyroidism who presents for follow-up.  She was referred by Dr. [place] for evaluation of atrial fibrillation, initially seen on 03/24/2021.  From review of prior EKGs, was in sinus rhythm on EKG 08/18/2018 but has been A. fib since EKG on 11/12/2019.  She was admitted from 11/12/2019- 11/16/2019 with submassive PE.  She was started on heparin drip and transition to Eliquis.  Echocardiogram on 11/2019 showed normal biventricular function, no significant valvular disease.  She took Eliquis x 6 months then stopped.  Zio patch was prescribed for 3 days on 04/05/2021 but only wore for 9 hours, showed 100% A. fib burden with average rate 71 bpm.  Echocardiogram on 04/20/2021 showed normal biventricular function RVSP 35, mild to moderate TR, mild MR, PFO.  Zio patch x3 days 06/12/2021 showed 100% A-fib burden with average rate 75 bpm.  She was admitted 2/7 through 12/21/2021 with acute hypoxic respiratory failure.  SPO2 84% on presentation and in A-fib with RVR to 110s.  CT chest showed small chronic PE and right lower lobe, new right pleural effusion, emphysematous changes throughout the lungs.  She was diuresed with IV Lasix for suspected acute on chronic diastolic heart failure.  She was resumed on home Lasix 40 mg 3 times weekly on discharge.  Since last clinic visit, she reports she has been doing well.  Home BP log shows BP 110s to 120s over 70s.  Weight has been stable.  Denies any chest pain, dyspnea, lightheadedness, syncope, lower extremity edema, or palpitations.  Taking eliquis, denies  any bleeding.   Wt Readings from Last 3 Encounters:  10/31/23 135 lb (61.2 kg)  08/08/23 134 lb (60.8 kg)  06/03/23 136 lb 6 oz (61.9 kg)      Past Medical History:  Diagnosis Date   DVT (deep venous thrombosis) (HCC)    GERD 10/03/2007   HYPERTENSION 10/03/2007   OSTEOPENIA 02/03/2008   Primary hyperparathyroidism (HCC) 10/03/2007    Past Surgical History:  Procedure Laterality Date   ABDOMINAL HYSTERECTOMY      Current Medications: Current Meds  Medication Sig   acetaminophen (TYLENOL) 500 MG tablet Take 500 mg by mouth 3 (three) times daily.   apixaban (ELIQUIS) 5 MG TABS tablet Take 1 tablet (5 mg total) by mouth 2 (two) times daily.   cinacalcet (SENSIPAR) 30 MG tablet TAKE 1 TABLET EVERY DAY   cyanocobalamin (VITAMIN B12) 1000 MCG tablet Take 1 tablet (1,000 mcg total) by mouth 2 (two) times a week. Tuesday and thursday   furosemide (LASIX) 20 MG tablet Take 1 tablet (20 mg total) by mouth every other day.   latanoprost (XALATAN) 0.005 % ophthalmic solution INSTILL 1 DROP INTO BOTH EYES AT BEDTIME (Patient taking differently: Place 1 drop into both eyes at bedtime.)   losartan (COZAAR) 25 MG tablet TAKE 1 TABLET (25 MG TOTAL) BY MOUTH DAILY.   magnesium oxide (MAG-OX) 400 (240 Mg) MG tablet TAKE 1 TABLET EVERY DAY   metoprolol tartrate (LOPRESSOR) 25 MG tablet Take 12.5 mg by mouth 2 (  two) times daily.   omeprazole (PRILOSEC) 40 MG capsule TAKE 1 CAPSULE EVERY DAY   OXYGEN Inhale 2 mLs into the lungs daily.   pravastatin (PRAVACHOL) 20 MG tablet TAKE 1 TABLET EVERY DAY   VITAMIN D PO Take 1,000 Units by mouth at bedtime.     Allergies:   Codeine   Social History   Socioeconomic History   Marital status: Widowed    Spouse name: Not on file   Number of children: Not on file   Years of education: Not on file   Highest education level: Not on file  Occupational History   Occupation: retired  Tobacco Use   Smoking status: Former    Current packs/day: 0.00     Types: Cigarettes    Quit date: 11/12/1978    Years since quitting: 44.9   Smokeless tobacco: Never  Vaping Use   Vaping status: Never Used  Substance and Sexual Activity   Alcohol use: No   Drug use: No   Sexual activity: Not Currently  Other Topics Concern   Not on file  Social History Narrative   Pt states her son and daughter in law live with her   Social Drivers of Health   Financial Resource Strain: Low Risk  (08/21/2022)   Overall Financial Resource Strain (CARDIA)    Difficulty of Paying Living Expenses: Not hard at all  Food Insecurity: No Food Insecurity (04/10/2023)   Hunger Vital Sign    Worried About Running Out of Food in the Last Year: Never true    Ran Out of Food in the Last Year: Never true  Transportation Needs: No Transportation Needs (03/30/2023)   PRAPARE - Administrator, Civil Service (Medical): No    Lack of Transportation (Non-Medical): No  Physical Activity: Inactive (08/21/2022)   Exercise Vital Sign    Days of Exercise per Week: 0 days    Minutes of Exercise per Session: 0 min  Stress: No Stress Concern Present (08/21/2022)   [employer] of Occupational Health - Occupational Stress Questionnaire    Feeling of Stress : Not at all  Social Connections: Moderately Isolated (07/12/2021)   Social Connection and Isolation Panel [NHANES]    Frequency of Communication with Friends and Family: More than three times a week    Frequency of Social Gatherings with Friends and Family: Never    Attends Religious Services: More than 4 times per year    Active Member of [employer] or Organizations: No    Attends Banker Meetings: Never    Marital Status: Widowed     Family History: The patient's family history is negative for Hypercalcemia.  ROS:   Please see the history of present illness.      All other systems reviewed and are negative.  EKGs/Labs/Other Studies Reviewed:    The following studies were reviewed today:  ECHO  04/20/2021  IMPRESSIONS    1. Left ventricular ejection fraction, by estimation, is 55 to 60%. The  left ventricle has normal function. The left ventricle has no regional  wall motion abnormalities. There is mild left ventricular hypertrophy.  Left ventricular diastolic parameters  are indeterminate.   2. Right ventricular systolic function is low normal. The right  ventricular size is normal. There is mildly elevated pulmonary artery  systolic pressure. The estimated right ventricular systolic pressure is  35.3 mmHg.   3. Right atrial size was mildly dilated.   4. The mitral valve is normal in structure. Mild mitral  valve  regurgitation. No evidence of mitral stenosis.   5. Tricuspid valve regurgitation is mild to moderate.   6. The aortic valve is tricuspid. Aortic valve regurgitation is not  visualized. Mild aortic valve sclerosis is present, with no evidence of  aortic valve stenosis.   7. The inferior vena cava is normal in size with greater than 50%  respiratory variability, suggesting right atrial pressure of 3 mmHg.   8. Evidence of atrial level shunting detected by color flow Doppler.  There is a patent foramen ovale with predominantly left to right shunting  across the atrial septum.   LONG TERM MONITOR 04/05/2021  Study Highlights   Only wore monitor for 9 hours 100% A. fib burden, with average heart rate 71 bpm     Patch Wear Time:  0 days and 9 hours (2022-05-17T02:22:30-0400 to 2022-05-17T12:00:43-0400)   Atrial Fibrillation occurred continuously (100% burden), ranging from 56-105 bpm (avg of 71 bpm). Isolated VEs were rare (<1.0%), VE Couplets were rare (<1.0%), and no VE Triplets were present.   Echo 11/13/2019:   IMPRESSIONS    1. Left ventricular ejection fraction, by visual estimation, is 60 to  65%. The left ventricle has normal function. There is borderline left  ventricular hypertrophy.   2. Left ventricular diastolic parameters are indeterminate.    3. The left ventricle has no regional wall motion abnormalities.   4. Global right ventricle has normal systolic function.The right  ventricular size is mildly enlarged. No increase in right ventricular wall  thickness.   5. Left atrial size was normal.   6. Right atrial size was normal.   7. The mitral valve is grossly normal. Mild mitral valve regurgitation.   8. The tricuspid valve is grossly normal.   9. The aortic valve is tricuspid. Aortic valve regurgitation is not  visualized.  10. The pulmonic valve was grossly normal. Pulmonic valve regurgitation is  trivial.  11. Mildly elevated pulmonary artery systolic pressure.  12. The tricuspid regurgitant velocity is 2.80 m/s, and with an assumed  right atrial pressure of 8 mmHg, the estimated right ventricular systolic  pressure is mildly elevated at 39.4 mmHg.  13. The inferior vena cava is dilated in size with >50% respiratory  variability, suggesting right atrial pressure of 8 mmHg.  14. Evidence of atrial level shunting detected by color flow Doppler.  EKG:   10/31/23: Afib, rate 60, LAD 10/22/2022: Atrial fibrillation, rate 89, poor R wave progression, Q waves in V1/2. 05/31/2021: atrial fibrillation, rate 105, PVCs 03/24/21: atrial fibrillation, rate 97, left axis deviation, poor R wave progression  Recent Labs: 03/29/2023: ALT 11 03/30/2023: Magnesium 1.8 04/09/2023: Hemoglobin 14.2; Platelets 191.0 06/03/2023: BUN 18; Creatinine, Ser 0.73; Potassium 3.9; Sodium 134  Recent Lipid Panel    Component Value Date/Time   CHOL 128 06/03/2023 0952   TRIG 41.0 06/03/2023 0952   HDL 68.00 06/03/2023 0952   CHOLHDL 2 06/03/2023 0952   VLDL 8.2 06/03/2023 0952   LDLCALC 52 06/03/2023 0952   LDLDIRECT 121.2 06/17/2012 0931    Physical Exam:    VS:  BP (!) 122/90   Pulse 60   Ht 5' 2.5" (1.588 m)   Wt 135 lb (61.2 kg)   SpO2 98%   BMI 24.30 kg/m     Wt Readings from Last 3 Encounters:  10/31/23 135 lb (61.2 kg)  08/08/23  134 lb (60.8 kg)  06/03/23 136 lb 6 oz (61.9 kg)     GEN:  in no acute distress  HEENT: Normal NECK: No JVD CARDIAC: Irregular, normal rate, no murmurs, rubs, gallops RESPIRATORY:  Clear to auscultation without rales, wheezing or rhonchi  ABDOMEN: Soft, non-tender, non-distended MUSCULOSKELETAL: 1+edema SKIN: Warm and dry NEUROLOGIC:  Alert and oriented x 3 PSYCHIATRIC:  Normal affect   ASSESSMENT:    1. Permanent atrial fibrillation (HCC)   2. Chronic diastolic heart failure (HCC)   3. Essential hypertension   4. Chronic hypoxic respiratory failure (HCC)   5. Hyperlipidemia, unspecified hyperlipidemia type       PLAN:    Atrial fibrillation: Likely chronic A-fib.  From review of prior EKGs, appears to have been in A. fib since her PE in January 2021.  CHA2DS2-VASc 5 (hypertension, age x2, PE, female).  Zio patch was prescribed for 3 days on 04/05/2021 but only wore for 9 hours, showed 100% A. fib burden with average rate 71 bpm.  Echocardiogram on 04/20/2021 showed normal biventricular function RVSP 35, mild to moderate TR, mild MR, PFO.  Zio patch x3 days 06/12/2021 showed 100% A-fib burden with average rate 75 bpm. -Continue Eliquis 5 mg twice daily.  Check CBC -On metoprolol 12.5 mg twice daily, appears rate controlled  Chronic diastolic heart failure: Echocardiogram on 04/20/2021 showed normal biventricular function RVSP 35, mild to moderate TR, mild MR, PFO.  On Lasix 20 mg daily.  Admitted 12/2021 with acute on chronic diastolic heart failure, responded well to IV Lasix. -Appears euvolemic, continue Lasix 20 mg every other day. Check BMET, magnesium  Chronic hypoxic respiratory failure: Requiring home O2 since hospitalization 12/2021.  Likely multifactorial with diastolic heart failure contributing, but also with small chronic PE and emphysematous changes to the lungs on CT chest.  Follows with pulmonology  Hypertension: On losartan 25 mg daily and metoprolol 12.5 mg.  BP mildly  elevated in clinic today but appears well-controlled on home log  Hyperlipidemia: On pravastatin 20 mg daily.  LDL 52 on 05/2023  RTC in 1 year  Medication Adjustments/Labs and Tests Ordered: Current medicines are reviewed at length with the patient today.  Concerns regarding medicines are outlined above.  Orders Placed This Encounter  Procedures   Basic metabolic panel   Magnesium   CBC w/Diff/Platelet   EKG 12-Lead    No orders of the defined types were placed in this encounter.    Patient Instructions  Medication Instructions:  Continue same medications *If you need a refill on your cardiac medications before your next appointment, please call your pharmacy*   Lab Work: Bmet,magnesium,cbc today   Testing/Procedures: None ordered   Follow-Up: At [facility], you and your health needs are our priority.  As part of our continuing mission to provide you with exceptional heart care, we have created designated Provider Care Teams.  These Care Teams include your primary Cardiologist (physician) and Advanced Practice Providers (APPs -  Physician Assistants and Nurse Practitioners) who all work together to provide you with the care you need, when you need it.  We recommend signing up for the patient portal called "MyChart".  Sign up information is provided on this After Visit Summary.  MyChart is used to connect with patients for Virtual Visits (Telemedicine).  Patients are able to view lab/test results, encounter notes, upcoming appointments, etc.  Non-urgent messages can be sent to your provider as well.   To learn more about what you can do with MyChart, go to [email or web address].    Your next appointment:  1 year   Call in August to schedule Dec appointment  Provider:  Dr.Ebony Rickel             Signed, Little Ishikawa, MD  10/31/2023 9:01 AM    [facility]

## 2023-10-31 ENCOUNTER — Ambulatory Visit: Payer: Medicare HMO | Attending: Cardiology | Admitting: Cardiology

## 2023-10-31 ENCOUNTER — Encounter: Payer: Self-pay | Admitting: Cardiology

## 2023-10-31 VITALS — BP 122/90 | HR 60 | Ht 62.5 in | Wt 135.0 lb

## 2023-10-31 DIAGNOSIS — E785 Hyperlipidemia, unspecified: Secondary | ICD-10-CM

## 2023-10-31 DIAGNOSIS — I4821 Permanent atrial fibrillation: Secondary | ICD-10-CM | POA: Diagnosis not present

## 2023-10-31 DIAGNOSIS — J9611 Chronic respiratory failure with hypoxia: Secondary | ICD-10-CM

## 2023-10-31 DIAGNOSIS — I10 Essential (primary) hypertension: Secondary | ICD-10-CM | POA: Diagnosis not present

## 2023-10-31 DIAGNOSIS — I5032 Chronic diastolic (congestive) heart failure: Secondary | ICD-10-CM

## 2023-10-31 NOTE — Patient Instructions (Signed)
Medication Instructions:  Continue same medications *If you need a refill on your cardiac medications before your next appointment, please call your pharmacy*   Lab Work: Bmet,magnesium,cbc today   Testing/Procedures: None ordered   Follow-Up: At [facility], you and your health needs are our priority.  As part of our continuing mission to provide you with exceptional heart care, we have created designated Provider Care Teams.  These Care Teams include your primary Cardiologist (physician) and Advanced Practice Providers (APPs -  Physician Assistants and Nurse Practitioners) who all work together to provide you with the care you need, when you need it.  We recommend signing up for the patient portal called "MyChart".  Sign up information is provided on this After Visit Summary.  MyChart is used to connect with patients for Virtual Visits (Telemedicine).  Patients are able to view lab/test results, encounter notes, upcoming appointments, etc.  Non-urgent messages can be sent to your provider as well.   To learn more about what you can do with MyChart, go to [email or web address].    Your next appointment:  1 year   Call in August to schedule Dec appointment     Provider:  Dr.Schumann

## 2023-11-01 LAB — CBC WITH DIFFERENTIAL/PLATELET
Basophils Absolute: 0 10*3/uL (ref 0.0–0.2)
Basos: 0 %
EOS (ABSOLUTE): 0 10*3/uL (ref 0.0–0.4)
Eos: 1 %
Hematocrit: 41.8 % (ref 34.0–46.6)
Hemoglobin: 13.7 g/dL (ref 11.1–15.9)
Immature Grans (Abs): 0 10*3/uL (ref 0.0–0.1)
Immature Granulocytes: 0 %
Lymphocytes Absolute: 0.8 10*3/uL (ref 0.7–3.1)
Lymphs: 13 %
MCH: 30.6 pg (ref 26.6–33.0)
MCHC: 32.8 g/dL (ref 31.5–35.7)
MCV: 94 fL (ref 79–97)
Monocytes Absolute: 0.8 10*3/uL (ref 0.1–0.9)
Monocytes: 13 %
Neutrophils Absolute: 4.3 10*3/uL (ref 1.4–7.0)
Neutrophils: 73 %
Platelets: 171 10*3/uL (ref 150–450)
RBC: 4.47 x10E6/uL (ref 3.77–5.28)
RDW: 11.8 % (ref 11.7–15.4)
WBC: 5.9 10*3/uL (ref 3.4–10.8)

## 2023-11-01 LAB — BASIC METABOLIC PANEL
BUN/Creatinine Ratio: 26 (ref 12–28)
BUN: 20 mg/dL (ref 10–36)
CO2: 27 mmol/L (ref 20–29)
Calcium: 10.5 mg/dL — ABNORMAL HIGH (ref 8.7–10.3)
Chloride: 91 mmol/L — ABNORMAL LOW (ref 96–106)
Creatinine, Ser: 0.76 mg/dL (ref 0.57–1.00)
Glucose: 70 mg/dL (ref 70–99)
Potassium: 4.5 mmol/L (ref 3.5–5.2)
Sodium: 134 mmol/L (ref 134–144)
eGFR: 74 mL/min/{1.73_m2} (ref >59)

## 2023-11-01 LAB — MAGNESIUM: Magnesium: 1.8 mg/dL (ref 1.6–2.3)

## 2023-11-02 ENCOUNTER — Encounter: Payer: Self-pay | Admitting: Cardiology

## 2023-11-14 ENCOUNTER — Telehealth: Payer: Self-pay | Admitting: Cardiology

## 2023-11-14 NOTE — Telephone Encounter (Signed)
 Jonette is calling from [facility] stating the patient has the collaborating MD missing from her form that was sent in. She is requesting this be faxed to [phone].   Please advise.

## 2023-11-15 NOTE — Telephone Encounter (Signed)
 I'm not sure what form she is referring to- can we touch base with her on this?

## 2023-11-15 NOTE — Telephone Encounter (Signed)
 Called and left patient a message to call office about paperwork F/U.

## 2023-11-20 ENCOUNTER — Other Ambulatory Visit: Payer: Self-pay | Admitting: Family Medicine

## 2023-11-20 NOTE — Telephone Encounter (Signed)
 Pt calling back to f/u on [employer] paperwork. Pt states that they have been waiting on paperwork since last month.Pt says that there is some missing info that needs to be filled out. Please advise

## 2023-11-21 NOTE — Telephone Encounter (Signed)
 Called and spoke to patient and additional information needed on [facility] filled out and faxed to [phone] and [phone]. Patient made aware and verbalizes understanding.

## 2023-11-29 ENCOUNTER — Telehealth: Payer: Self-pay | Admitting: Pharmacy Technician

## 2023-11-29 NOTE — Telephone Encounter (Signed)
Notified staff about:

## 2023-11-29 NOTE — Telephone Encounter (Addendum)
 Marland Kitchen

## 2023-12-04 ENCOUNTER — Ambulatory Visit: Payer: Medicare HMO | Admitting: Family Medicine

## 2023-12-06 ENCOUNTER — Ambulatory Visit: Payer: Medicare HMO | Admitting: Family Medicine

## 2023-12-10 ENCOUNTER — Encounter: Payer: Medicare HMO | Admitting: Family Medicine

## 2023-12-10 DIAGNOSIS — I4811 Longstanding persistent atrial fibrillation: Secondary | ICD-10-CM

## 2023-12-12 NOTE — Telephone Encounter (Unsigned)
Copied from CRM [id number]. Topic: Clinical - Prescription Issue >> Dec 12, 2023  2:36 PM Annette [name] wrote: Reason for CRM: FYI: Patient [phone] justed wanted to let Dr. Betty [name] was denied for the [facility] grind program for Eliquis 2025. Patient will no longer get the medication for free, she states cannot afford the medication and will leave it in God's hands.

## 2023-12-19 NOTE — Telephone Encounter (Signed)
 Yes we can refer to Coumadin clinic.  If she agreeable to this?

## 2023-12-20 NOTE — Telephone Encounter (Signed)
 Called patient and made the patient aware that Dr. Alda Amas recommended referral to Coumadin clinic. Patient verbalized an understanding.

## 2023-12-20 NOTE — Telephone Encounter (Addendum)
 Received anticoagulation enrollment notification to transition pt from Eliquis  to Warfarin due to cost. I called and spoke with pt. I educated her on the Warfarin as an alternative, cheaper options. I did make her aware Warfarin will require frequent INR monitoring and consistency with Vit K foods. After education, pt states she does not wish to take this medication. Pt expressed she is turning 89 this year, is on oxygen, uses a walker, and her son is her only transportation option who works 5 days a week. Pt states at her age, this is something she is not willing to do. Will forward message to Dr Kate to make him aware.

## 2024-01-03 NOTE — Progress Notes (Signed)
 HPI: [name] is a 89 y.o. female with a PMHx significant for chronic hypoxia on supplemental oxygen, HFpEF, atrial fibrillation on chronic anticoagulation, HTN, GERD, hyperparathyroidism, and anxiety, who is here today for her routine physical.  Last CPE: more than one year ago She also follows with cardiology, endocrinology, and pulmonology. She sees each of them once per year.   Exercise: Patient says she is active around the house doing her chores, states that she is no longer leaving the house, except for appts.  Diet: She is eating healthy in general and avoiding salt. She says her weight is stable. She eats chicken, [place], and beef for protein.  Sleep: She reports she is sleeping more lately.  Alcohol Use:  Smoking:Former, quit in 1980. Vision: UTD on routine vision care.  Dental: She no longer goes to a dentist.   Immunization History  Administered Date(s) Administered   Fluad Quad(high Dose 65+) 07/15/2019, 08/31/2020   Influenza Split 08/22/2011   Influenza Whole 08/10/2010   Influenza, High Dose Seasonal PF 08/04/2014, 08/05/2016, 08/04/2017, 07/24/2018   Influenza,inj,Quad PF,6+ Mos 08/10/2013   Influenza-Unspecified 07/27/2017, 09/21/2021, 08/30/2022   PFIZER(Purple Top)SARS-COV-2 Vaccination 01/10/2020, 02/03/2020, 02/22/2021   Pneumococcal Conjugate-13 08/10/2014   Pneumococcal Polysaccharide-23 08/10/2013   Tdap 08/31/2020   Health Maintenance  Topic Date Due   Zoster Vaccines- Shingrix (1 of 2) Never done   COVID-19 Vaccine (4 - 2024-25 season) 07/14/2023   INFLUENZA VACCINE  02/10/2024 (Originally 06/13/2023)   Medicare Annual Wellness (AWV)  08/07/2024   DTaP/Tdap/Td (2 - Td or Tdap) 08/31/2030   Pneumonia Vaccine 77+ Years old  Completed   HPV VACCINES  Aged Out   DEXA SCAN  Discontinued   Chronic medical problems:   Chronic respiratory failure with hypoxia:  She is on 2 L per minute of oxygen continuously , which she believes is helping with  SOB.  Home SpO2 is consistently 98-99.  Next appointment with pulmonology is 3/12.  COPD: She is not on pharmacologic treatment.  Atrial fibrillation:  She is no longer on Eliquis 5 mg because it is no longer covered by her insurance.  Not taking aspirin 81 mg.   Hypertension:  Medications: Currently on losartan 25 mg daily and metoprolol tartrate 12.5 mg twice daily Also on furosemide 20 mg every other day for lower extremity edema.  Side effects: none She has been having some palpitations over the last few days. Negative for exertional chest pain, focal weakness, or worsening edema. HFpEF, she has not had PND or orthopnea. Takes Furosemide 20 mg every other day. Lab Results  Component Value Date   CREATININE 0.76 10/31/2023   BUN 20 10/31/2023   NA 134 10/31/2023   K 4.5 10/31/2023   CL 91 (L) 10/31/2023   CO2 27 10/31/2023   Hyperlipidemia: Currently on pravastatin 20 mg daily.  Side effects from medication: none Lab Results  Component Value Date   CHOL 128 06/03/2023   HDL 68.00 06/03/2023   LDLCALC 52 06/03/2023   LDLDIRECT 121.2 06/17/2012   TRIG 41.0 06/03/2023   CHOLHDL 2 06/03/2023   Hyperparathyroidism:  Currently on Cinacalcet 30 mg daily.  Next appointment with endocrinology is 5/8.   GERD:  Currently on omeprazole 40 mg daily.  She has not had heartburn or dysphagia.  Generalized OA She has decreased her tylenol 500 mg from three times daily to twice daily.   Denies any falls since her last visit.   Review of Systems  Constitutional:  Positive for  fatigue. Negative for appetite change and fever.  HENT:  Negative for mouth sores and sore throat.   Eyes:  Negative for redness and visual disturbance.  Respiratory:  Negative for cough and wheezing.   Cardiovascular:  Negative for chest pain.  Gastrointestinal:  Negative for abdominal pain, nausea and vomiting.  Endocrine: Negative for cold intolerance, heat intolerance, polydipsia, polyphagia and  polyuria.  Genitourinary:  Negative for decreased urine volume, dysuria and hematuria.  Musculoskeletal:  Positive for arthralgias and gait problem.  Skin:  Negative for color change and rash.  Allergic/Immunologic: Positive for environmental allergies.  Neurological:  Negative for syncope, weakness and headaches.  Psychiatric/Behavioral:  Negative for confusion. The patient is nervous/anxious.   All other systems reviewed and are negative.  Current Outpatient Medications on File Prior to Visit  Medication Sig Dispense Refill   acetaminophen (TYLENOL) 500 MG tablet Take 500 mg by mouth 3 (three) times daily.     apixaban (ELIQUIS) 5 MG TABS tablet Take 1 tablet (5 mg total) by mouth 2 (two) times daily. 60 tablet 0   cinacalcet (SENSIPAR) 30 MG tablet TAKE 1 TABLET EVERY DAY 90 tablet 1   cyanocobalamin (VITAMIN B12) 1000 MCG tablet Take 1 tablet (1,000 mcg total) by mouth 2 (two) times a week. Tuesday and thursday 90 tablet 3   furosemide (LASIX) 20 MG tablet Take 1 tablet (20 mg total) by mouth every other day. 45 tablet 2   latanoprost (XALATAN) 0.005 % ophthalmic solution INSTILL 1 DROP INTO BOTH EYES AT BEDTIME (Patient taking differently: Place 1 drop into both eyes at bedtime.) 7.5 mL 3   losartan (COZAAR) 25 MG tablet TAKE 1 TABLET (25 MG TOTAL) BY MOUTH DAILY. 90 tablet 3   magnesium oxide (MAG-OX) 400 (240 Mg) MG tablet TAKE 1 TABLET EVERY DAY 90 tablet 0   metoprolol tartrate (LOPRESSOR) 25 MG tablet Take 12.5 mg by mouth 2 (two) times daily.     omeprazole (PRILOSEC) 40 MG capsule TAKE 1 CAPSULE EVERY DAY 90 capsule 3   OXYGEN Inhale 2 mLs into the lungs daily.     pravastatin (PRAVACHOL) 20 MG tablet TAKE 1 TABLET EVERY DAY 90 tablet 3   VITAMIN D PO Take 1,000 Units by mouth at bedtime.     No current facility-administered medications on file prior to visit.   Past Medical History:  Diagnosis Date   DVT (deep venous thrombosis) (HCC)    GERD 10/03/2007   HYPERTENSION  10/03/2007   OSTEOPENIA 02/03/2008   Primary hyperparathyroidism (HCC) 10/03/2007   Past Surgical History:  Procedure Laterality Date   ABDOMINAL HYSTERECTOMY     Allergies  Allergen Reactions   Codeine Nausea And Vomiting   Family History  Problem Relation Age of Onset   Hypercalcemia Neg Hx    Social History   Socioeconomic History   Marital status: Widowed    Spouse name: Not on file   Number of children: Not on file   Years of education: Not on file   Highest education level: Not on file  Occupational History   Occupation: retired  Tobacco Use   Smoking status: Former    Current packs/day: 0.00    Types: Cigarettes    Quit date: 11/12/1978    Years since quitting: 45.1   Smokeless tobacco: Never  Vaping Use   Vaping status: Never Used  Substance and Sexual Activity   Alcohol use: No   Drug use: No   Sexual activity: Not Currently  Other Topics  Concern   Not on file  Social History Narrative   Pt states her son and daughter in law live with her   Social Drivers of Health   Financial Resource Strain: Low Risk  (08/21/2022)   Overall Financial Resource Strain (CARDIA)    Difficulty of Paying Living Expenses: Not hard at all  Food Insecurity: No Food Insecurity (04/10/2023)   Hunger Vital Sign    Worried About Running Out of Food in the Last Year: Never true    Ran Out of Food in the Last Year: Never true  Transportation Needs: No Transportation Needs (03/30/2023)   PRAPARE - Administrator, Civil Service (Medical): No    Lack of Transportation (Non-Medical): No  Physical Activity: Inactive (08/21/2022)   Exercise Vital Sign    Days of Exercise per Week: 0 days    Minutes of Exercise per Session: 0 min  Stress: No Stress Concern Present (08/21/2022)   [employer] of Occupational Health - Occupational Stress Questionnaire    Feeling of Stress : Not at all  Social Connections: Moderately Isolated (07/12/2021)   Social Connection and Isolation  Panel [NHANES]    Frequency of Communication with Friends and Family: More than three times a week    Frequency of Social Gatherings with Friends and Family: Never    Attends Religious Services: More than 4 times per year    Active Member of [employer] or Organizations: No    Attends Banker Meetings: Never    Marital Status: Widowed   Today's Vitals   01/06/24 0815  BP: 120/80  Pulse: 82  Resp: 20  SpO2: 94%  Weight: 132 lb (59.9 kg)  Height: 5' 2.5" (1.588 m)   Body mass index is 23.76 kg/m.  Wt Readings from Last 3 Encounters:  01/06/24 132 lb (59.9 kg)  10/31/23 135 lb (61.2 kg)  08/08/23 134 lb (60.8 kg)   Physical Exam Vitals and nursing note reviewed.  Constitutional:      General: She is not in acute distress.    Appearance: She is well-developed.  HENT:     Head: Normocephalic and atraumatic.     Right Ear: External ear normal.     Left Ear: External ear normal.     Mouth/Throat:     Mouth: Mucous membranes are moist.     Pharynx: Oropharynx is clear. Uvula midline.  Eyes:     Conjunctiva/sclera: Conjunctivae normal.     Pupils: Pupils are equal, round, and reactive to light.  Cardiovascular:     Rate and Rhythm: Normal rate. Rhythm irregular.     Pulses:          Dorsalis pedis pulses are 2+ on the right side and 2+ on the left side.     Heart sounds: No murmur heard.    Comments: Trace pitting LE edema, bilateral. Pulmonary:     Effort: Pulmonary effort is normal. No respiratory distress.     Breath sounds: Normal breath sounds.     Comments: On continues O2 supplementation per Abbott 2 LPM. Abdominal:     Palpations: Abdomen is soft. There is no mass.     Tenderness: There is no abdominal tenderness.  Musculoskeletal:     Comments: No signs of synovitis appreciated.  Lymphadenopathy:     Cervical: No cervical adenopathy.  Skin:    General: Skin is warm.     Findings: No erythema or rash.  Neurological:     General: No focal deficit  present.      Mental Status: She is alert and oriented to person, place, and time.     Comments: Unstable gait assisted with a walker.  Psychiatric:        Mood and Affect: Affect normal. Mood is anxious.   ASSESSMENT AND PLAN:  Ms. MERLEAN PIZZINI was here today for her annual physical examination.  Lab Results  Component Value Date   NA 133 (L) 01/06/2024   CL 92 (L) 01/06/2024   K 4.6 01/06/2024   CO2 33 (H) 01/06/2024   BUN 22 01/06/2024   CREATININE 0.73 01/06/2024   GFR 72.01 01/06/2024   CALCIUM 10.2 01/06/2024   PHOS 2.8 03/30/2023   ALBUMIN 4.5 01/06/2024   GLUCOSE 93 01/06/2024   Lab Results  Component Value Date   ALT 18 01/06/2024   AST 26 01/06/2024   ALKPHOS 66 01/06/2024   BILITOT 1.2 01/06/2024   Routine general medical examination at a health care facility We discussed the importance of regular physical activity as tolerated and healthy diet for prevention of disease complications. Preventive guidelines reviewed. Vaccination up to date. Next CPE in a year.  Essential hypertension BP adequately controlled. Continue Losartan 25 mg daily and Metoprolol Tartrate 25 mg 1/2 tab bid as well as low salt diet. She is monitoring BP regularly.  -     Comprehensive metabolic panel; Future  Chronic hypoxic respiratory failure (HCC) On continuous O2 supplementation, 2 LPM. Follows with pulmonologist.  Chronic diastolic heart failure (HCC) Appears to be euvolemic. Continue Metoprolol Tartrate 25 mg 1/2 tab bid and Furosemide 20 mg every other day.  -     Comprehensive metabolic panel; Future  Centrilobular emphysema (HCC) She is not on inhalers, she was not able to afford some prescribed in the past. Following with pulmonologist.  Permanent atrial fibrillation (HCC) She is not longer on Eliquis, cannot afford it. She is not interested in trying a different medication,states that she cannot afford any at this time. Currently on Metoprolol Tartrate 25 mg 1/2  bid. Following with cardiologist.  -     Magnesium; Future  Return in 6 months (on 07/05/2024) for chronic problems.   I, Rolla Etienne Wierda, acting as a scribe for Shadara Lopez [place], MD., have documented all relevant documentation on the behalf of Satish Hammers [place], MD, as directed by  Darrien Belter [place], MD while in the presence of Gerianne Simonet [place], MD.   I, Neil Errickson [place], MD, have reviewed all documentation for this visit. The documentation on 01/03/24 for the exam, diagnosis, procedures, and orders are all accurate and complete.  Doral Digangi G. [place], MD  [facility]. [place] office.

## 2024-01-06 ENCOUNTER — Ambulatory Visit (INDEPENDENT_AMBULATORY_CARE_PROVIDER_SITE_OTHER): Payer: Medicare HMO | Admitting: Family Medicine

## 2024-01-06 ENCOUNTER — Encounter: Payer: Self-pay | Admitting: Family Medicine

## 2024-01-06 VITALS — BP 120/80 | HR 82 | Resp 20 | Ht 62.5 in | Wt 132.0 lb

## 2024-01-06 DIAGNOSIS — Z0000 Encounter for general adult medical examination without abnormal findings: Secondary | ICD-10-CM | POA: Diagnosis not present

## 2024-01-06 DIAGNOSIS — I10 Essential (primary) hypertension: Secondary | ICD-10-CM | POA: Diagnosis not present

## 2024-01-06 DIAGNOSIS — J9611 Chronic respiratory failure with hypoxia: Secondary | ICD-10-CM | POA: Diagnosis not present

## 2024-01-06 DIAGNOSIS — I5032 Chronic diastolic (congestive) heart failure: Secondary | ICD-10-CM | POA: Diagnosis not present

## 2024-01-06 DIAGNOSIS — J432 Centrilobular emphysema: Secondary | ICD-10-CM

## 2024-01-06 DIAGNOSIS — I4821 Permanent atrial fibrillation: Secondary | ICD-10-CM | POA: Diagnosis not present

## 2024-01-06 LAB — COMPREHENSIVE METABOLIC PANEL
ALT: 18 U/L (ref 0–35)
AST: 26 U/L (ref 0–37)
Albumin: 4.5 g/dL (ref 3.5–5.2)
Alkaline Phosphatase: 66 U/L (ref 39–117)
BUN: 22 mg/dL (ref 6–23)
CO2: 33 meq/L — ABNORMAL HIGH (ref 19–32)
Calcium: 10.2 mg/dL (ref 8.4–10.5)
Chloride: 92 meq/L — ABNORMAL LOW (ref 96–112)
Creatinine, Ser: 0.73 mg/dL (ref 0.40–1.20)
GFR: 72.01 mL/min (ref >60.00)
Glucose, Bld: 93 mg/dL (ref 70–99)
Potassium: 4.6 meq/L (ref 3.5–5.1)
Sodium: 133 meq/L — ABNORMAL LOW (ref 135–145)
Total Bilirubin: 1.2 mg/dL (ref 0.2–1.2)
Total Protein: 6.9 g/dL (ref 6.0–8.3)

## 2024-01-06 LAB — MAGNESIUM: Magnesium: 1.8 mg/dL (ref 1.5–2.5)

## 2024-01-06 NOTE — Patient Instructions (Addendum)
 A few things to remember from today's visit:  Routine general medical examination at a health care facility  Essential hypertension - Plan: Comprehensive metabolic panel  Chronic hypoxic respiratory failure (HCC), Chronic  Chronic diastolic heart failure (HCC), Chronic - Plan: Comprehensive metabolic panel  Centrilobular emphysema (HCC), Chronic  Permanent atrial fibrillation (HCC), Chronic - Plan: Magnesium  No changes today.  If you need refills for medications you take chronically, please call your pharmacy. Do not use My Chart to request refills or for acute issues that need immediate attention. If you send a my chart message, it may take a few days to be addressed, specially if I am not in the office.  Please be sure medication list is accurate. If a new problem present, please set up appointment sooner than planned today.

## 2024-01-09 ENCOUNTER — Encounter: Payer: Self-pay | Admitting: Family Medicine

## 2024-01-16 ENCOUNTER — Telehealth: Payer: Self-pay | Admitting: Cardiology

## 2024-01-16 DIAGNOSIS — I4821 Permanent atrial fibrillation: Secondary | ICD-10-CM

## 2024-01-16 NOTE — Telephone Encounter (Signed)
 Patient identification verified by 2 forms. Shade Flood, RN     Called and spoke to patient  Patient states:  - Denied patient assistance due to not meeting OOP cost first.  - she was only on eliquis for a blood clot and does not thinks she needs to continue the medication.  - Wants to know if she needs to continue the eliquis? If so she would like alternatives.              Interventions/Plan: - encounter forwarded to primary cardiologist for recommendations.   Patient agrees with plan, no questions at this time

## 2024-01-16 NOTE — Telephone Encounter (Signed)
 Pt c/o medication issue:  1. Name of Medication: Eliquis   2. How are you currently taking this medication (dosage and times per day)?   3. Are you having a reaction (difficulty breathing--STAT)?   4. What is your medication issue? Patient states that she was denied patient assistance and would like call back to discuss this further. States she has been without medication and would like to know what her other options for this medication are. Please advise.

## 2024-01-21 NOTE — Telephone Encounter (Signed)
 I would recommend continuing anticoagulation for atrial fibrillation.  Can we schedule her in pharmacy clinic to explore alternatives?

## 2024-01-22 ENCOUNTER — Encounter (HOSPITAL_BASED_OUTPATIENT_CLINIC_OR_DEPARTMENT_OTHER): Payer: Self-pay | Admitting: Pulmonary Disease

## 2024-01-22 ENCOUNTER — Ambulatory Visit (HOSPITAL_BASED_OUTPATIENT_CLINIC_OR_DEPARTMENT_OTHER): Payer: Medicare HMO | Admitting: Pulmonary Disease

## 2024-01-22 VITALS — BP 118/74 | HR 67 | Ht 62.5 in | Wt 134.4 lb

## 2024-01-22 DIAGNOSIS — J432 Centrilobular emphysema: Secondary | ICD-10-CM

## 2024-01-22 DIAGNOSIS — Z9981 Dependence on supplemental oxygen: Secondary | ICD-10-CM | POA: Diagnosis not present

## 2024-01-22 DIAGNOSIS — Z87891 Personal history of nicotine dependence: Secondary | ICD-10-CM

## 2024-01-22 DIAGNOSIS — J9611 Chronic respiratory failure with hypoxia: Secondary | ICD-10-CM

## 2024-01-22 NOTE — Patient Instructions (Addendum)
 Emphysema --Patient currently not bronchodilators. Has declined --Ok to hold off as her symptoms do not seem consistent with worsening COPD. Please contact us if you wish to start treatment for shortness of breath, cough or wheezing.  Chronic hypoxemic respiratory failure Exertional hypoxemia --Ambulatory O2 in-clinic. Increased O2 requirement to 3L with activity and sleep --CONTINUE supplemental oxygen for goal SpO2 >88% --CONTINUE 3L with activity and sleep --Consider a repeat ambulatory O2 at next visit to ensure stability of O2

## 2024-01-22 NOTE — Progress Notes (Signed)
 Subjective:   PATIENT ID: Annette [name] GENDER: female DOB: April 12, 1930, MRN: [id number]   HPI  Chief Complaint  Patient presents with   Follow-up    Chronic hypoxemic respiratory failure    Reason for Visit: Follow-up emphysema  Ms. Annette [name] is a 89 year old female with chronic hypoxemic respiratory failure, chronic diastolic heart failure, atrial fibrillation, chronic pulmonary embolism dx 11/12/19, hypertension, GERD, history of pulmonary nodules and primary hyperparathyroidism who presents for follow-up.   Initial consult Referred by Dr. Bjorn Pippin. Her son Loraine Leriche is present with her and provides additional history. She was seen by cardiology on 01/05/2022 with Dr. Bjorn Pippin.  Has been diuresed for acute on chronic diastolic heart failure and was noted to be hypervolemic on exam.  Of note she was recently hospitalized in February for this issue and had CT chest completed.  Since her hospitalization she has continued to require home oxygen.  With her recent CT demonstrating emphysema and small chronic PE, she was referred to pulmonary for further evaluation. She is active and able to care for herself. Describes herself as sedentary but able to ambulate independently with walker. She has shortness of breath and wheezing related to her heart issues. This has improved but will still feel short of breath with minimal activity. Has been compliant with her oxygen at rest and with activity, 2L via [place]. She is anxious about taking off her oxygen.  05/08/2022 She reports is compliant with her oxygen. Denies shortness of breath, cough or wheezing. She is able to cook and care for herself. She does not have an exercise routine. She is homebound but is content with that. Denies recent respiratory infections in the last 12 months.  01/21/23 Her son is her caregiver and driver. Since our last visit she reports overall doing well. Ambulates with walker and sedentary at baseline. Denies shortness of  breath, cough or wheezing. She lives at home with her son's family. Wears oxygen during the day and nightly. Documented her BP with average SBP in 110s. She is limiting her salt intake. Denies any recent respiratory infections.  01/22/24 Since our last visit she was hospitalized in May 2024 for syncope thought to be multifactoris. Echo was concerning for pulmonary HTN. She was treated for colitis at the time and had unchanged chronic O2 requirement of 2L. She has been shortness of breath with activity. Denies coughing or wheezing. Denies any respiratory infections or exacerbations. She limits her community contact. Pollen will trigger her so she tries to avoid and stays indoors.  Social History: Quit smoking 50 years ago however describes herself as social smoker. She is unclear why she has COPD Second hand exposure Her son Loraine Leriche is involved in her medical care Husband was a Medical laboratory scientific officer. Passed in 2014  Past Medical History:  Diagnosis Date   DVT (deep venous thrombosis) (HCC)    GERD 10/03/2007   HYPERTENSION 10/03/2007   OSTEOPENIA 02/03/2008   Primary hyperparathyroidism (HCC) 10/03/2007     Family History  Problem Relation Age of Onset   Hypercalcemia Neg Hx      Social History   Occupational History   Occupation: retired  Tobacco Use   Smoking status: Former    Current packs/day: 0.00    Types: Cigarettes    Quit date: 11/12/1978    Years since quitting: 45.2   Smokeless tobacco: Never  Vaping Use   Vaping status: Never Used  Substance and Sexual Activity   Alcohol use: No   Drug use:  No   Sexual activity: Not Currently    Allergies  Allergen Reactions   Codeine Nausea And Vomiting     Outpatient Medications Prior to Visit  Medication Sig Dispense Refill   acetaminophen (TYLENOL) 500 MG tablet Take 500 mg by mouth in the morning and at bedtime.     cinacalcet (SENSIPAR) 30 MG tablet TAKE 1 TABLET EVERY DAY 90 tablet 1   cyanocobalamin (VITAMIN B12) 1000 MCG  tablet Take 1 tablet (1,000 mcg total) by mouth 2 (two) times a week. Tuesday and thursday 90 tablet 3   furosemide (LASIX) 20 MG tablet Take 1 tablet (20 mg total) by mouth every other day. 45 tablet 2   latanoprost (XALATAN) 0.005 % ophthalmic solution INSTILL 1 DROP INTO BOTH EYES AT BEDTIME (Patient taking differently: Place 1 drop into both eyes at bedtime.) 7.5 mL 3   losartan (COZAAR) 25 MG tablet TAKE 1 TABLET (25 MG TOTAL) BY MOUTH DAILY. 90 tablet 3   magnesium oxide (MAG-OX) 400 (240 Mg) MG tablet TAKE 1 TABLET EVERY DAY 90 tablet 0   metoprolol tartrate (LOPRESSOR) 25 MG tablet Take 12.5 mg by mouth 2 (two) times daily.     omeprazole (PRILOSEC) 40 MG capsule TAKE 1 CAPSULE EVERY DAY 90 capsule 3   OXYGEN Inhale 2 mLs into the lungs daily.     pravastatin (PRAVACHOL) 20 MG tablet TAKE 1 TABLET EVERY DAY 90 tablet 3   VITAMIN D PO Take 1,000 Units by mouth at bedtime.     No facility-administered medications prior to visit.    Review of Systems  Constitutional:  Negative for chills, diaphoresis, fever, malaise/fatigue and weight loss.  HENT:  Negative for congestion.   Respiratory:  Positive for shortness of breath. Negative for cough, hemoptysis, sputum production and wheezing.   Cardiovascular:  Negative for chest pain, palpitations and leg swelling.     Objective:   Vitals:   01/22/24 0848  BP: 118/74  Pulse: 67  SpO2: 97%  Weight: 134 lb 6.4 oz (61 kg)  Height: 5' 2.5" (1.588 m)   SpO2: 97 %  Physical Exam: General: Frail-appearing, no acute distress HENT: [place], AT Eyes: EOMI, no scleral icterus Respiratory: Clear to auscultation bilaterally.  No crackles, wheezing or rales Cardiovascular: RRR, -M/R/G, no JVD Extremities:-Edema,-tenderness Neuro: AAO x4, CNII-XII grossly intact Psych: Normal mood, normal affect  Data Reviewed:  Imaging: CTA 11/12/2019-acute PE in the right lung distal main pulmonary artery extending to subsegmental branches.  Subsegmental  PE in the left upper and lower lobes.  2 pulmonary nodules in the right lung measuring 5 mm.  Linear scarring in the superior segment of the left lower lobe CT chest 12/19/2021-small chronic pulmonary emboli lower lobe corresponding to PE and 11/12/2019.  Emphysema.  Small right pleural effusion with atelectasis.  Right lower lobe nodule measuring 5 mm, unchanged. Similar left lung scarring seen compared to 2020. CXR 03/30/23 - Cardiomegaly without acute cardiopulmonary disease  PFT: None on file  Cardiac: Echo 04/20/2021-EF 55 to 60%.  No WMA.  Mild LVH.  Indeterminate diastolic parameters.  RV systolic function low normal.  Mildly elevated PASP.  RVSP 35.3. Mild dilation RA.  Mild MR.  Mild/moderate TR.  PFO with left-to-right shunting Echo 03/30/23 - EF 60-65%. No WMA. Elevated PASP. Normal RV and fucntion. Mild-mod TR. Mild dilation RA  Labs: CBC    Component Value Date/Time   WBC 5.9 10/31/2023 0931   WBC 3.9 (L) 04/09/2023 0805   RBC 4.47 10/31/2023  0931   RBC 4.57 04/09/2023 0805   HGB 13.7 10/31/2023 0931   HCT 41.8 10/31/2023 0931   PLT 171 10/31/2023 0931   MCV 94 10/31/2023 0931   MCH 30.6 10/31/2023 0931   MCH 30.7 03/30/2023 0437   MCHC 32.8 10/31/2023 0931   MCHC 33.2 04/09/2023 0805   RDW 11.8 10/31/2023 0931   LYMPHSABS 0.8 10/31/2023 0931   MONOABS 0.9 06/11/2022 1345   EOSABS 0.0 10/31/2023 0931   BASOSABS 0.0 10/31/2023 0931   Absolute eos 12/19/21 - 100     Latest Ref Rng & Units 01/06/2024    9:04 AM 10/31/2023    9:31 AM 06/03/2023    9:52 AM  BMP  Glucose 70 - 99 mg/dL 93  70  99   BUN 6 - 23 mg/dL 22  20  18    Creatinine 0.40 - 1.20 mg/dL 9.14  7.82  9.56   BUN/Creat Ratio 12 - 28  26    Sodium 135 - 145 mEq/L 133  134  134   Potassium 3.5 - 5.1 mEq/L 4.6  4.5  3.9   Chloride 96 - 112 mEq/L 92  91  94   CO2 19 - 32 mEq/L 33  27  31   Calcium 8.4 - 10.5 mg/dL 21.3  08.6  57.8    Williams, Jamie L, CMA Wed Jan 22, 2024 11:54 AM   DME: [employer] // 3L o2    SATURATION QUALIFICATIONS: (This note is used to comply with regulatory documentation for home oxygen)   Patient Saturations on Room Air at Rest = 95%   Patient Saturations on [employer] while Ambulating = 85%   Patient Saturations on 3 Liters of oxygen while Ambulating = 98%   Please briefly explain why patient needs home oxygen: to maintain oxygen saturation above 90%    Assessment & Plan:   Discussion: 89 year old female with emphysema, chronic hypoxemic respiratory failure, chronic diastolic heart failure, atrial fibrillation, chronic pulmonary embolism diagnosed 01/12/19, HTN, GERD, hx pulmonary nodules who presents for follow-up. Worsening shortness of breath likely driven by secondary pulmonary hypertension with increased O2 during ambulation  Emphysema --Patient currently not bronchodilators. Has declined --Ok to hold off as her symptoms do not seem consistent with worsening COPD. Please contact us if you wish to start treatment for shortness of breath, cough or wheezing.  Chronic hypoxemic respiratory failure Exertional hypoxemia --Ambulatory O2 in-clinic. Increased O2 requirement to 3L with activity and sleep --CONTINUE supplemental oxygen for goal SpO2 >88% --CONTINUE 3L with activity and sleep --Consider a repeat ambulatory O2 at next visit to ensure stability of O2  Goals of Care She states if she were to have a life-threatening illness, she would not want CPR or life support. Her husband was in hospice and she believes that would a positive experience for her when her time comes. She would still want treatment including medication or fluids for any treatable conditions. --Please discuss with your family to update them on your goals  Health Maintenance Immunization History  Administered Date(s) Administered   Fluad Quad(high Dose 65+) 07/15/2019, 08/31/2020   Influenza Split 08/22/2011   Influenza Whole 08/10/2010   Influenza, High Dose Seasonal PF 08/04/2014,  08/05/2016, 08/04/2017, 07/24/2018   Influenza,inj,Quad PF,6+ Mos 08/10/2013   Influenza-Unspecified 07/27/2017, 09/21/2021, 08/30/2022   PFIZER(Purple Top)SARS-COV-2 Vaccination 01/10/2020, 02/03/2020, 02/22/2021   Pneumococcal Conjugate-13 08/10/2014   Pneumococcal Polysaccharide-23 08/10/2013   Tdap 08/31/2020   CT Lung Screen- not qualified due to age  Orders Placed This Encounter  Procedures   Ambulatory Referral for DME    Referral Priority:   Routine    Referral Type:   Durable Medical Equipment Purchase    Number of Visits Requested:   1   No orders of the defined types were placed in this encounter.   Return in about 6 months (around 07/24/2024).  I have spent a total time of 35-minutes on the day of the appointment including chart review, data review, collecting history, coordinating care and discussing medical diagnosis and plan with the patient/family. Past medical history, allergies, medications were reviewed. Pertinent imaging, labs and tests included in this note have been reviewed and interpreted independently by me.  Karon Cotterill Mechele Collin, MD [facility] 01/22/2024 9:20 AM

## 2024-01-22 NOTE — Addendum Note (Signed)
 Addended by: Amada Kingfisher on: 01/22/2024 11:57 AM   Modules accepted: Orders

## 2024-01-23 ENCOUNTER — Telehealth: Payer: Self-pay | Admitting: *Deleted

## 2024-01-23 NOTE — Telephone Encounter (Signed)
 Copied from CRM [id number]. Topic: General - Other >> Jan 23, 2024  9:13 AM Annette [name] wrote: Reason for CRM:  Patient wants to inform to Dr. [place] that her pulmonologist has increased or oxygen to 3 liters and will be having that re checked in 6 months to see if patient can get back down to 2 liters or stay at 3.

## 2024-01-27 NOTE — Telephone Encounter (Signed)
 fyi

## 2024-01-29 NOTE — Telephone Encounter (Signed)
 Left message for pt to call.

## 2024-02-03 ENCOUNTER — Telehealth: Payer: Self-pay

## 2024-02-03 ENCOUNTER — Encounter (HOSPITAL_COMMUNITY)
Admission: EM | Disposition: A | Discharge status: Home / Self Care | Payer: Self-pay | Source: Home / Self Care | Attending: Cardiology

## 2024-02-03 ENCOUNTER — Ambulatory Visit (HOSPITAL_COMMUNITY): Admit: 2024-02-03 | Admitting: Cardiology

## 2024-02-03 ENCOUNTER — Inpatient Hospital Stay (HOSPITAL_COMMUNITY)
Admission: EM | Admit: 2024-02-03 | Discharge: 2024-02-05 | DRG: 287 | Disposition: A | Discharge status: Home / Self Care | Attending: Cardiology | Admitting: Cardiology

## 2024-02-03 ENCOUNTER — Other Ambulatory Visit: Payer: Self-pay

## 2024-02-03 DIAGNOSIS — Z79899 Other long term (current) drug therapy: Secondary | ICD-10-CM | POA: Diagnosis not present

## 2024-02-03 DIAGNOSIS — I4821 Permanent atrial fibrillation: Secondary | ICD-10-CM | POA: Diagnosis not present

## 2024-02-03 DIAGNOSIS — Q2112 Patent foramen ovale: Secondary | ICD-10-CM

## 2024-02-03 DIAGNOSIS — E785 Hyperlipidemia, unspecified: Secondary | ICD-10-CM | POA: Diagnosis present

## 2024-02-03 DIAGNOSIS — K219 Gastro-esophageal reflux disease without esophagitis: Secondary | ICD-10-CM | POA: Diagnosis present

## 2024-02-03 DIAGNOSIS — Z90710 Acquired absence of both cervix and uterus: Secondary | ICD-10-CM

## 2024-02-03 DIAGNOSIS — J9611 Chronic respiratory failure with hypoxia: Secondary | ICD-10-CM | POA: Diagnosis not present

## 2024-02-03 DIAGNOSIS — I2119 ST elevation (STEMI) myocardial infarction involving other coronary artery of inferior wall: Secondary | ICD-10-CM | POA: Insufficient documentation

## 2024-02-03 DIAGNOSIS — I5181 Takotsubo syndrome: Secondary | ICD-10-CM | POA: Diagnosis not present

## 2024-02-03 DIAGNOSIS — I2109 ST elevation (STEMI) myocardial infarction involving other coronary artery of anterior wall: Secondary | ICD-10-CM

## 2024-02-03 DIAGNOSIS — Z7901 Long term (current) use of anticoagulants: Secondary | ICD-10-CM | POA: Diagnosis not present

## 2024-02-03 DIAGNOSIS — Z87891 Personal history of nicotine dependence: Secondary | ICD-10-CM

## 2024-02-03 DIAGNOSIS — E210 Primary hyperparathyroidism: Secondary | ICD-10-CM | POA: Diagnosis present

## 2024-02-03 DIAGNOSIS — J449 Chronic obstructive pulmonary disease, unspecified: Secondary | ICD-10-CM | POA: Diagnosis present

## 2024-02-03 DIAGNOSIS — R001 Bradycardia, unspecified: Secondary | ICD-10-CM | POA: Diagnosis not present

## 2024-02-03 DIAGNOSIS — I2782 Chronic pulmonary embolism: Secondary | ICD-10-CM | POA: Diagnosis not present

## 2024-02-03 DIAGNOSIS — Z9981 Dependence on supplemental oxygen: Secondary | ICD-10-CM

## 2024-02-03 DIAGNOSIS — E871 Hypo-osmolality and hyponatremia: Secondary | ICD-10-CM | POA: Diagnosis present

## 2024-02-03 DIAGNOSIS — R110 Nausea: Secondary | ICD-10-CM | POA: Diagnosis not present

## 2024-02-03 DIAGNOSIS — Z885 Allergy status to narcotic agent status: Secondary | ICD-10-CM | POA: Diagnosis not present

## 2024-02-03 DIAGNOSIS — J90 Pleural effusion, not elsewhere classified: Secondary | ICD-10-CM | POA: Diagnosis not present

## 2024-02-03 DIAGNOSIS — I10 Essential (primary) hypertension: Secondary | ICD-10-CM | POA: Diagnosis present

## 2024-02-03 DIAGNOSIS — I4819 Other persistent atrial fibrillation: Secondary | ICD-10-CM | POA: Diagnosis present

## 2024-02-03 DIAGNOSIS — R079 Chest pain, unspecified: Secondary | ICD-10-CM | POA: Diagnosis not present

## 2024-02-03 DIAGNOSIS — R0902 Hypoxemia: Secondary | ICD-10-CM | POA: Diagnosis not present

## 2024-02-03 DIAGNOSIS — I959 Hypotension, unspecified: Secondary | ICD-10-CM | POA: Diagnosis not present

## 2024-02-03 HISTORY — PX: LEFT HEART CATH AND CORONARY ANGIOGRAPHY: CATH118249

## 2024-02-03 LAB — PROTIME-INR
INR: 1.1 (ref 0.8–1.2)
Prothrombin Time: 14.1 s (ref 11.4–15.2)

## 2024-02-03 LAB — COMPREHENSIVE METABOLIC PANEL
ALT: 19 U/L (ref 0–44)
AST: 23 U/L (ref 15–41)
Albumin: 3.7 g/dL (ref 3.5–5.0)
Alkaline Phosphatase: 56 U/L (ref 38–126)
Anion gap: 9 (ref 5–15)
BUN: 19 mg/dL (ref 8–23)
CO2: 29 mmol/L (ref 22–32)
Calcium: 9.2 mg/dL (ref 8.9–10.3)
Chloride: 89 mmol/L — ABNORMAL LOW (ref 98–111)
Creatinine, Ser: 0.79 mg/dL (ref 0.44–1.00)
GFR, Estimated: >60 mL/min (ref >60)
Glucose, Bld: 138 mg/dL — ABNORMAL HIGH (ref 70–99)
Potassium: 3.6 mmol/L (ref 3.5–5.1)
Sodium: 127 mmol/L — ABNORMAL LOW (ref 135–145)
Total Bilirubin: 1.1 mg/dL (ref 0.0–1.2)
Total Protein: 5.8 g/dL — ABNORMAL LOW (ref 6.5–8.1)

## 2024-02-03 LAB — CBC WITH DIFFERENTIAL/PLATELET
Abs Immature Granulocytes: 0.01 10*3/uL (ref 0.00–0.07)
Basophils Absolute: 0 10*3/uL (ref 0.0–0.1)
Basophils Relative: 0 %
Eosinophils Absolute: 0 10*3/uL (ref 0.0–0.5)
Eosinophils Relative: 0 %
HCT: 38.2 % (ref 36.0–46.0)
Hemoglobin: 12.8 g/dL (ref 12.0–15.0)
Immature Granulocytes: 0 %
Lymphocytes Relative: 15 %
Lymphs Abs: 0.8 10*3/uL (ref 0.7–4.0)
MCH: 31 pg (ref 26.0–34.0)
MCHC: 33.5 g/dL (ref 30.0–36.0)
MCV: 92.5 fL (ref 80.0–100.0)
Monocytes Absolute: 0.7 10*3/uL (ref 0.1–1.0)
Monocytes Relative: 13 %
Neutro Abs: 3.9 10*3/uL (ref 1.7–7.7)
Neutrophils Relative %: 72 %
Platelets: 152 10*3/uL (ref 150–400)
RBC: 4.13 MIL/uL (ref 3.87–5.11)
RDW: 12.5 % (ref 11.5–15.5)
WBC: 5.4 10*3/uL (ref 4.0–10.5)
nRBC: 0 % (ref 0.0–0.2)

## 2024-02-03 LAB — LIPID PANEL
Cholesterol: 112 mg/dL (ref 0–200)
HDL: 59 mg/dL (ref >40)
LDL Cholesterol: 42 mg/dL (ref 0–99)
Total CHOL/HDL Ratio: 1.9 ratio
Triglycerides: 53 mg/dL (ref <150)
VLDL: 11 mg/dL (ref 0–40)

## 2024-02-03 LAB — TROPONIN I (HIGH SENSITIVITY)
Troponin I (High Sensitivity): 20 ng/L — ABNORMAL HIGH (ref <18)
Troponin I (High Sensitivity): 250 ng/L (ref <18)

## 2024-02-03 LAB — POCT I-STAT, CHEM 8
BUN: 20 mg/dL (ref 8–23)
Calcium, Ion: 1.21 mmol/L (ref 1.15–1.40)
Chloride: 87 mmol/L — ABNORMAL LOW (ref 98–111)
Creatinine, Ser: 0.9 mg/dL (ref 0.44–1.00)
Glucose, Bld: 142 mg/dL — ABNORMAL HIGH (ref 70–99)
HCT: 41 % (ref 36.0–46.0)
Hemoglobin: 13.9 g/dL (ref 12.0–15.0)
Potassium: 3.7 mmol/L (ref 3.5–5.1)
Sodium: 128 mmol/L — ABNORMAL LOW (ref 135–145)
TCO2: 30 mmol/L (ref 22–32)

## 2024-02-03 LAB — CG4 I-STAT (LACTIC ACID): Lactic Acid, Venous: 0.7 mmol/L (ref 0.5–1.9)

## 2024-02-03 LAB — APTT: aPTT: 29 s (ref 24–36)

## 2024-02-03 LAB — HEMOGLOBIN A1C
Hgb A1c MFr Bld: 4.9 % (ref 4.8–5.6)
Mean Plasma Glucose: 93.93 mg/dL

## 2024-02-03 LAB — MRSA NEXT GEN BY PCR, NASAL: MRSA by PCR Next Gen: NOT DETECTED

## 2024-02-03 SURGERY — LEFT HEART CATH AND CORONARY ANGIOGRAPHY
Anesthesia: LOCAL

## 2024-02-03 MED ORDER — FENTANYL CITRATE (PF) 100 MCG/2ML IJ SOLN
INTRAMUSCULAR | Status: AC
  Filled 2024-02-03: qty 2

## 2024-02-03 MED ORDER — MIDAZOLAM HCL 2 MG/2ML IJ SOLN
INTRAMUSCULAR | Status: DC | PRN
  Administered 2024-02-03: 1 mg via INTRAVENOUS

## 2024-02-03 MED ORDER — SODIUM CHLORIDE 0.9 % WEIGHT BASED INFUSION
1.0000 mL/kg/h | INTRAVENOUS | Status: AC
Start: 2024-02-03 — End: 2024-02-03
  Administered 2024-02-03: 1 mL/kg/h via INTRAVENOUS

## 2024-02-03 MED ORDER — VERAPAMIL HCL 2.5 MG/ML IV SOLN
INTRAVENOUS | Status: DC | PRN
  Administered 2024-02-03: 6 mL via INTRA_ARTERIAL

## 2024-02-03 MED ORDER — HEPARIN SODIUM (PORCINE) 1000 UNIT/ML IJ SOLN
INTRAMUSCULAR | Status: DC | PRN
  Administered 2024-02-03: 2000 [IU] via INTRAVENOUS

## 2024-02-03 MED ORDER — FENTANYL CITRATE (PF) 100 MCG/2ML IJ SOLN
INTRAMUSCULAR | Status: DC | PRN
  Administered 2024-02-03: 25 ug via INTRAVENOUS

## 2024-02-03 MED ORDER — MIDAZOLAM HCL 2 MG/2ML IJ SOLN
INTRAMUSCULAR | Status: AC
  Filled 2024-02-03: qty 2

## 2024-02-03 MED ORDER — LATANOPROST 0.005 % OP SOLN
1.0000 [drp] | Freq: Every day | OPHTHALMIC | Status: DC
  Administered 2024-02-03 – 2024-02-04 (×2): 1 [drp] via OPHTHALMIC
  Filled 2024-02-03: qty 2.5

## 2024-02-03 MED ORDER — ACETAMINOPHEN 325 MG PO TABS
650.0000 mg | ORAL_TABLET | ORAL | Status: DC | PRN
  Administered 2024-02-03 – 2024-02-04 (×4): 650 mg via ORAL
  Filled 2024-02-03 (×4): qty 2

## 2024-02-03 MED ORDER — HEPARIN SODIUM (PORCINE) 1000 UNIT/ML IJ SOLN
INTRAMUSCULAR | Status: AC
  Filled 2024-02-03: qty 10

## 2024-02-03 MED ORDER — MAGNESIUM OXIDE -MG SUPPLEMENT 400 (240 MG) MG PO TABS
400.0000 mg | ORAL_TABLET | Freq: Every day | ORAL | Status: DC
  Administered 2024-02-03 – 2024-02-05 (×3): 400 mg via ORAL
  Filled 2024-02-03 (×3): qty 1

## 2024-02-03 MED ORDER — PRAVASTATIN SODIUM 10 MG PO TABS
20.0000 mg | ORAL_TABLET | Freq: Every day | ORAL | Status: DC
  Administered 2024-02-03 – 2024-02-05 (×3): 20 mg via ORAL
  Filled 2024-02-03 (×3): qty 2

## 2024-02-03 MED ORDER — SODIUM CHLORIDE 0.9 % IV SOLN: 250.0000 mL | INTRAVENOUS | Status: AC | PRN

## 2024-02-03 MED ORDER — CINACALCET HCL 30 MG PO TABS
30.0000 mg | ORAL_TABLET | Freq: Every day | ORAL | Status: DC
  Administered 2024-02-03 – 2024-02-05 (×3): 30 mg via ORAL
  Filled 2024-02-03 (×3): qty 1

## 2024-02-03 MED ORDER — LIDOCAINE HCL (PF) 1 % IJ SOLN
INTRAMUSCULAR | Status: AC
  Filled 2024-02-03: qty 30

## 2024-02-03 MED ORDER — CHLORHEXIDINE GLUCONATE CLOTH 2 % EX PADS
6.0000 | MEDICATED_PAD | Freq: Every day | CUTANEOUS | Status: DC
  Administered 2024-02-03 – 2024-02-04 (×2): 6 via TOPICAL

## 2024-02-03 MED ORDER — METOPROLOL TARTRATE 12.5 MG HALF TABLET
12.5000 mg | ORAL_TABLET | Freq: Two times a day (BID) | ORAL | Status: DC
  Administered 2024-02-03 – 2024-02-05 (×4): 12.5 mg via ORAL
  Filled 2024-02-03 (×4): qty 1

## 2024-02-03 MED ORDER — SODIUM CHLORIDE 0.9% FLUSH: 3.0000 mL | INTRAVENOUS | Status: DC | PRN

## 2024-02-03 MED ORDER — LIDOCAINE HCL (PF) 1 % IJ SOLN
INTRAMUSCULAR | Status: DC | PRN
  Administered 2024-02-03: 2 mL
  Administered 2024-02-03: 15 mL

## 2024-02-03 MED ORDER — ONDANSETRON HCL 4 MG/2ML IJ SOLN: 4.0000 mg | Freq: Four times a day (QID) | INTRAMUSCULAR | Status: DC | PRN

## 2024-02-03 MED ORDER — HEPARIN (PORCINE) IN NACL 2000-0.9 UNIT/L-% IV SOLN
INTRAVENOUS | Status: DC | PRN
  Administered 2024-02-03: 1000 mL

## 2024-02-03 MED ORDER — PANTOPRAZOLE SODIUM 40 MG PO TBEC
40.0000 mg | DELAYED_RELEASE_TABLET | Freq: Every day | ORAL | Status: DC
  Administered 2024-02-03 – 2024-02-05 (×3): 40 mg via ORAL
  Filled 2024-02-03 (×3): qty 1

## 2024-02-03 MED ORDER — SODIUM CHLORIDE 0.9% FLUSH
3.0000 mL | Freq: Two times a day (BID) | INTRAVENOUS | Status: DC
  Administered 2024-02-04 – 2024-02-05 (×4): 3 mL via INTRAVENOUS

## 2024-02-03 MED ORDER — APIXABAN 5 MG PO TABS
5.0000 mg | ORAL_TABLET | Freq: Two times a day (BID) | ORAL | Status: DC
Start: 2024-02-04 — End: 2024-02-05
  Administered 2024-02-04 – 2024-02-05 (×3): 5 mg via ORAL
  Filled 2024-02-03 (×3): qty 1

## 2024-02-03 MED ORDER — VERAPAMIL HCL 2.5 MG/ML IV SOLN
INTRAVENOUS | Status: AC
  Filled 2024-02-03: qty 2

## 2024-02-03 MED ORDER — FUROSEMIDE 20 MG PO TABS
20.0000 mg | ORAL_TABLET | ORAL | Status: DC
  Administered 2024-02-04: 20 mg via ORAL
  Filled 2024-02-03 (×2): qty 1

## 2024-02-03 MED ORDER — LOSARTAN POTASSIUM 25 MG PO TABS
25.0000 mg | ORAL_TABLET | Freq: Every day | ORAL | Status: DC
  Administered 2024-02-03 – 2024-02-05 (×3): 25 mg via ORAL
  Filled 2024-02-03 (×3): qty 1

## 2024-02-03 MED ORDER — IOHEXOL 350 MG/ML SOLN
INTRAVENOUS | Status: DC | PRN
  Administered 2024-02-03: 40 mL

## 2024-02-03 SURGICAL SUPPLY — 17 items
CATH INFINITI 5FR MULTPACK ANG (CATHETERS) IMPLANT
CATH INFINITI AMBI 5FR TG (CATHETERS) IMPLANT
CLOSURE MYNX CONTROL 6F/7F (Vascular Products) IMPLANT
COVER PRB 48X5XTLSCP FOLD TPE (BAG) IMPLANT
DEVICE RAD COMP TR BAND LRG (VASCULAR PRODUCTS) IMPLANT
DEVICE RAD TR BAND REGULAR (VASCULAR PRODUCTS) IMPLANT
ELECT DEFIB PAD ADLT CADENCE (PAD) IMPLANT
GLIDESHEATH SLEND A-KIT 6F 22G (SHEATH) IMPLANT
GUIDEWIRE ANGLED .035X150CM (WIRE) IMPLANT
GUIDEWIRE INQWIRE 1.5J.035X260 (WIRE) IMPLANT
INQWIRE 1.5J .035X260CM (WIRE) ×1 IMPLANT
KIT ENCORE 26 ADVANTAGE (KITS) IMPLANT
KIT HEMO VALVE WATCHDOG (MISCELLANEOUS) IMPLANT
PACK CARDIAC CATHETERIZATION (CUSTOM PROCEDURE TRAY) ×1 IMPLANT
SET ATX-X65L (MISCELLANEOUS) IMPLANT
SHEATH PINNACLE 6F 10CM (SHEATH) IMPLANT
WIRE MICRO SET SILHO 5FR 7 (SHEATH) IMPLANT

## 2024-02-03 NOTE — Telephone Encounter (Signed)
 I called and spoke with pt's daughter in law (on [place]) and let her know that pt does have a DNR on file since 11/2021.

## 2024-02-03 NOTE — Progress Notes (Signed)
 PM Round: Patient comfortable in bed, on 3 L O2 per nasal cannula (home oxygen).  She is saturating well with an oxygen saturation of 96%.  She complains of some chest and back discomfort but overall appears comfortable.  I reviewed Dr. Verl Dicker H&P and her cardiac catheterization findings that suggest Takotsubo syndrome.  The patient is hemodynamically stable with well-controlled atrial fibrillation and blood pressure 146/64.  Lungs have good air movement but diffuse rales are noted, heart is irregular with no murmur or gallop, radial cath site is clear, there is no lower extremity edema.  Discussed anticoagulation with the patient: She has been off of apixaban for at least 2 to 3 months.  Will place a pharmacy consult and see if we can get her in a patient assistance program.  She prefers to be treated with Tylenol for her chest pain.  Will start her back on metoprolol 12.5 mg twice daily for treatment of her acute Takotsubo syndrome and rate control of her atrial fibrillation.  Tonny Bollman 02/03/2024 5:19 PM

## 2024-02-03 NOTE — Progress Notes (Signed)
 PHARMACY - ANTICOAGULATION CONSULT NOTE  Pharmacy Consult for apixaban Indication:  afib  Allergies  Allergen Reactions   Codeine Nausea And Vomiting    Patient Measurements:     Vital Signs: BP: 133/76 (03/24 1535) Pulse Rate: 66 (03/24 1535)  Labs: Recent Labs    02/03/24 1445  HGB 12.8  HCT 38.2  PLT 152  APTT 29  LABPROT 14.1  INR 1.1  CREATININE 0.79  TROPONINIHS 20*    Estimated Creatinine Clearance: 37.1 mL/min (by C-G formula based on SCr of 0.79 mg/dL).   Medical History: Past Medical History:  Diagnosis Date   DVT (deep venous thrombosis) (HCC)    GERD 10/03/2007   HYPERTENSION 10/03/2007   OSTEOPENIA 02/03/2008   Primary hyperparathyroidism (HCC) 10/03/2007    Medications:  Medications Prior to Admission  Medication Sig Dispense Refill Last Dose/Taking   acetaminophen (TYLENOL) 500 MG tablet Take 500 mg by mouth in the morning and at bedtime.      cinacalcet (SENSIPAR) 30 MG tablet TAKE 1 TABLET EVERY DAY 90 tablet 1    cyanocobalamin (VITAMIN B12) 1000 MCG tablet Take 1 tablet (1,000 mcg total) by mouth 2 (two) times a week. Tuesday and thursday 90 tablet 3    furosemide (LASIX) 20 MG tablet Take 1 tablet (20 mg total) by mouth every other day. 45 tablet 2    latanoprost (XALATAN) 0.005 % ophthalmic solution INSTILL 1 DROP INTO BOTH EYES AT BEDTIME (Patient taking differently: Place 1 drop into both eyes at bedtime.) 7.5 mL 3    losartan (COZAAR) 25 MG tablet TAKE 1 TABLET (25 MG TOTAL) BY MOUTH DAILY. 90 tablet 3    magnesium oxide (MAG-OX) 400 (240 Mg) MG tablet TAKE 1 TABLET EVERY DAY 90 tablet 0    metoprolol tartrate (LOPRESSOR) 25 MG tablet Take 12.5 mg by mouth 2 (two) times daily.      omeprazole (PRILOSEC) 40 MG capsule TAKE 1 CAPSULE EVERY DAY 90 capsule 3    OXYGEN Inhale 2 mLs into the lungs daily.      pravastatin (PRAVACHOL) 20 MG tablet TAKE 1 TABLET EVERY DAY 90 tablet 3    VITAMIN D PO Take 1,000 Units by mouth at bedtime.        Assessment: 89 y.o. F presents with CP/STEMI. No evidence of CAD seen on cath 3/24. Pharmacy consulted to start apixaban for PAF 3/25 a.m. Of note, pt was on apixaban in the past (Dec 2020 for 6 months) for PE. CBC stable on admission.   Goal of Therapy:  Prevention of CVA Monitor platelets by anticoagulation protocol: Yes   Plan:  Apixaban 5mg  po BID Will need pt education and copay check tomorrow  Christoper Fabian, PharmD, BCPS Please see amion for complete clinical pharmacist phone list 02/03/2024,4:16 PM

## 2024-02-03 NOTE — Telephone Encounter (Signed)
 Copied from CRM [id number]. Topic: General - Other >> Feb 03, 2024  3:28 PM Florestine Avers wrote: Reason for CRM: Patients daughter in law called wanting to know if the patient had a DNR on file. Patients daughter in law is requesting a call back with that information and can be reached at: [phone].

## 2024-02-03 NOTE — H&P (Signed)
 Cardiology Admission History and Physical   Patient ID: Annette [name] MRN: [id number]; DOB: Mar 23, 1930   Admission date: 02/03/2024  PCP:  [place], [name], MD   [facility] Providers Cardiologist:  Little Ishikawa, MD     Chief Complaint: STEMI  Patient Profile:   Annette [name] is a 89 y.o. female with hypertension, GERD, PE, hyperparathyroidism, COPD with chronic hypoxemic respiratory failure on 24-hour 7 days 3 L oxygen by nasal cannula, permanent atrial fibrillation who is being seen 02/03/2024 for the evaluation of chest pain/STEMI.  History of Present Illness:   Annette [name] is a 89 year old female with past medical history noted above.  She has been followed by Dr. Bjorn Pippin as an outpatient.  Admitted 10/2019 with a submassive PE and placed on IV heparin with transition to Eliquis.  Echocardiogram at that time showed normal biventricular function with no significant valvular disease.  She was treated with Eliquis for 6 months then stopped. Wore a Zio patch for 3 days 03/2021 which showed 100% atrial fibrillation burden with average heart rate of 70.  Echocardiogram at that time showed normal biventricular function, mild to moderate TR, mild MR, PFO.  Admitted 12/2021 with acute hypoxic respiratory failure, on presentation was in A-fib RVR of 110s.  CT chest showed small chronic PE with right pleural effusion, emphysematous changes throughout.  She was diuresed with IV Lasix and resumed on home Lasix 40 mg 3 times a week.   Last seen in the clinic 10/2023 reported doing well.  She was continued on metoprolol 12.5 mg twice daily, Eliquis 5 mg twice daily, Lasix 20 mg daily, losartan 25 mg daily.  She was doing well until this morning, around 10 AM started having chest pain with radiation to the back.  She waited for some time and around noon she felt chest discomfort was even worse and eventually EMS was activated.  Upon presentation to the emergency room, still having  nausea associated with chest pain with radiation to the back.  I called and spoke to her son Annette [name] over the telephone, advised him that her EKG shows inferolateral STEMI and she needs to emergently go to the cardiac catheterization lab to evaluate coronary anatomy.  He agreed to proceed with procedure to make a comfortable however stated that she is DNR and she did not have any resuscitative measures done in case of complications.  Patient also agreed upon this.  She will be taken emergently to the cardiac catheterization lab.  Past Medical History:  Diagnosis Date   DVT (deep venous thrombosis) (HCC)    GERD 10/03/2007   HYPERTENSION 10/03/2007   OSTEOPENIA 02/03/2008   Primary hyperparathyroidism (HCC) 10/03/2007    Past Surgical History:  Procedure Laterality Date   ABDOMINAL HYSTERECTOMY       Medications Prior to Admission: Prior to Admission medications   Medication Sig Start Date End Date Taking? Authorizing Provider  acetaminophen (TYLENOL) 500 MG tablet Take 500 mg by mouth in the morning and at bedtime.    [provider]  cinacalcet (SENSIPAR) 30 MG tablet TAKE 1 TABLET EVERY DAY 04/11/23   Carlus Pavlov, MD  cyanocobalamin (VITAMIN B12) 1000 MCG tablet Take 1 tablet (1,000 mcg total) by mouth 2 (two) times a week. Tuesday and thursday 05/16/23   [place], [name], MD  furosemide (LASIX) 20 MG tablet Take 1 tablet (20 mg total) by mouth every other day. 04/15/23   [place], [name], MD  latanoprost (XALATAN) 0.005 % ophthalmic  solution INSTILL 1 DROP INTO BOTH EYES AT BEDTIME Patient taking differently: Place 1 drop into both eyes at bedtime. 10/29/17   Gordy Savers, MD  losartan (COZAAR) 25 MG tablet TAKE 1 TABLET (25 MG TOTAL) BY MOUTH DAILY. 07/16/23   [place], [name], MD  magnesium oxide (MAG-OX) 400 (240 Mg) MG tablet TAKE 1 TABLET EVERY DAY 09/06/23   Little Ishikawa, MD  metoprolol tartrate (LOPRESSOR) 25 MG tablet Take 12.5 mg by mouth 2 (two) times  daily.    [provider]  omeprazole (PRILOSEC) 40 MG capsule TAKE 1 CAPSULE EVERY DAY 11/20/23   [place], [name], MD  OXYGEN Inhale 2 mLs into the lungs daily.    [provider]  pravastatin (PRAVACHOL) 20 MG tablet TAKE 1 TABLET EVERY DAY 07/02/23   [place], [name], MD  VITAMIN D PO Take 1,000 Units by mouth at bedtime.    [provider]     Allergies:    Allergies  Allergen Reactions   Codeine Nausea And Vomiting   Social History   Tobacco Use   Smoking status: Former    Current packs/day: 0.00    Types: Cigarettes    Quit date: 11/12/1978    Years since quitting: 45.2   Smokeless tobacco: Never  Substance Use Topics   Alcohol use: No    Widowed   There is no height or weight on file to calculate BMI.  Review of Systems  Cardiovascular:  Positive for chest pain and dyspnea on exertion. Negative for leg swelling.  Gastrointestinal:  Positive for nausea.   Physical Exam Neck:     Vascular: No carotid bruit or JVD.  Cardiovascular:     Rate and Rhythm: Normal rate. Rhythm irregular.     Pulses: Normal pulses and intact distal pulses.     Heart sounds: No murmur heard. Pulmonary:     Effort: Pulmonary effort is normal.     Breath sounds: Rhonchi (scattered diffuse ronchi bilateral) and rales (bilateral) present.  Abdominal:     General: Bowel sounds are normal.     Palpations: Abdomen is soft.  Musculoskeletal:     Right lower leg: No edema.     Left lower leg: No edema.  Skin:    Capillary Refill: Capillary refill takes less than 2 seconds.     EKG: EKG by EMS at 14: 44 hrs. on 02/03/2024: Underlying atrial fibrillation with controlled ventricular response at the rate of 45 bpm, inferior and anterolateral STEMI. Relevant CV Studies:  Echo: 03/2023  1. Left ventricular ejection fraction, by estimation, is 60 to 65%. The left ventricle has normal function. The left ventricle has no regional wall motion abnormalities. There is moderate  asymmetric left ventricular hypertrophy of the basal-septal  segment. Left ventricular diastolic parameters are indeterminate.  2. Right ventricular systolic function is normal. The right ventricular size is normal. There is severely elevated pulmonary artery systolic pressure. The estimated right ventricular systolic pressure is 61.0 mmHg.  3. Left atrial size was severely dilated.  4. Right atrial size was mildly dilated.  5. The mitral valve is normal in structure. Trivial mitral valve regurgitation.  6. Tricuspid valve regurgitation is mild to moderate.  7. The aortic valve is tricuspid. Aortic valve regurgitation is not visualized. Aortic valve sclerosis/calcification is present, without any evidence of aortic stenosis.  8. The inferior vena cava is dilated in size with >50% respiratory variability, suggesting right atrial pressure of 8 mmHg.  Laboratory Data: PendingRadiology/Studies:  No results found.   Assessment and Plan:   Annette [name] is a 89 y.o. female with hypertension, GERD, PE, hyperparathyroidism, COPD with chronic hypoxemic respiratory failure on 24-hour 7 days 3 L oxygen by nasal cannula, permanent atrial fibrillation who is being seen 02/03/2024 for the evaluation of chest pain/STEMI.  1.  Acute inferior and anterolateral STEMI 2.  Chronic hypoxemic respiratory failure on 3 L oxygen 3.  Permanent atrial fibrillation 4.  Primary hypertension  Patient with ongoing chest discomfort with EKG clearly showing inferolateral and anterior STEMI, will proceed directly to the cardiac catheter lab.  Although 89 years of age, she is relatively independent lives with her son and takes care of herself and is able to ambulate with help of walker at home.  I discussed with patient's son Annette [name] about cardiac catheterization, states that that we could proceed with cardiac catheterization but she is DNR.  To keep her comfort, in view of very large anterolateral and inferior STEMI, will  proceed with emergent cardiac catheterization.  With regard to chronic hypoxemic respiratory failure and diffuse lung rhonchi that is heard, this appears to be chronic and patient does not appear to be in acute pulmonary edema.  Patient has permanent atrial fibrillation, appears that she has had issues with getting Eliquis and this needs to be addressed.  With regard to hypertension, blood pressure is well-controlled on present medical regimen, will address this again postcardiac catheterization.  For questions or updates, please contact [facility] Please consult [email or web address] for contact info under     Yates Decamp, MD, [facility] 02/03/2024, 3:39 PM [facility] [address] [place], [place] [address] Phone: [phone]. Fax:  [phone]

## 2024-02-04 ENCOUNTER — Telehealth: Payer: Self-pay

## 2024-02-04 ENCOUNTER — Telehealth (HOSPITAL_COMMUNITY): Payer: Self-pay | Admitting: Pharmacy Technician

## 2024-02-04 ENCOUNTER — Other Ambulatory Visit (HOSPITAL_COMMUNITY): Payer: Self-pay

## 2024-02-04 ENCOUNTER — Encounter (HOSPITAL_COMMUNITY): Payer: Self-pay | Admitting: Cardiology

## 2024-02-04 ENCOUNTER — Inpatient Hospital Stay (HOSPITAL_COMMUNITY)

## 2024-02-04 DIAGNOSIS — I5181 Takotsubo syndrome: Secondary | ICD-10-CM | POA: Diagnosis not present

## 2024-02-04 LAB — BASIC METABOLIC PANEL
Anion gap: 11 (ref 5–15)
BUN: 18 mg/dL (ref 8–23)
CO2: 28 mmol/L (ref 22–32)
Calcium: 9.6 mg/dL (ref 8.9–10.3)
Chloride: 87 mmol/L — ABNORMAL LOW (ref 98–111)
Creatinine, Ser: 0.86 mg/dL (ref 0.44–1.00)
GFR, Estimated: >60 mL/min (ref >60)
Glucose, Bld: 126 mg/dL — ABNORMAL HIGH (ref 70–99)
Potassium: 4.6 mmol/L (ref 3.5–5.1)
Sodium: 126 mmol/L — ABNORMAL LOW (ref 135–145)

## 2024-02-04 LAB — ECHOCARDIOGRAM COMPLETE
AR max vel: 2.27 cm2
AV Peak grad: 3.6 mmHg
Ao pk vel: 0.95 m/s
Area-P 1/2: 5.02 cm2
Height: 62.5 in
MV M vel: 5.09 m/s
MV Peak grad: 103.4 mmHg
P 1/2 time: 858 ms
S' Lateral: 2.3 cm
Weight: 2289.26 [oz_av]

## 2024-02-04 LAB — CBC
HCT: 39.9 % (ref 36.0–46.0)
Hemoglobin: 13.5 g/dL (ref 12.0–15.0)
MCH: 31.5 pg (ref 26.0–34.0)
MCHC: 33.8 g/dL (ref 30.0–36.0)
MCV: 93.2 fL (ref 80.0–100.0)
Platelets: 130 10*3/uL — ABNORMAL LOW (ref 150–400)
RBC: 4.28 MIL/uL (ref 3.87–5.11)
RDW: 12.4 % (ref 11.5–15.5)
WBC: 9.1 10*3/uL (ref 4.0–10.5)
nRBC: 0 % (ref 0.0–0.2)

## 2024-02-04 LAB — MAGNESIUM: Magnesium: 1.8 mg/dL (ref 1.7–2.4)

## 2024-02-04 LAB — GLUCOSE, CAPILLARY: Glucose-Capillary: 102 mg/dL — ABNORMAL HIGH (ref 70–99)

## 2024-02-04 MED ORDER — MAGNESIUM SULFATE 2 GM/50ML IV SOLN
2.0000 g | Freq: Once | INTRAVENOUS | Status: AC
  Administered 2024-02-04: 2 g via INTRAVENOUS
  Filled 2024-02-04: qty 50

## 2024-02-04 MED ORDER — POTASSIUM CHLORIDE CRYS ER 10 MEQ PO TBCR
10.0000 meq | EXTENDED_RELEASE_TABLET | Freq: Every day | ORAL | Status: DC
  Administered 2024-02-04 – 2024-02-05 (×2): 10 meq via ORAL
  Filled 2024-02-04 (×2): qty 1

## 2024-02-04 NOTE — Progress Notes (Signed)
 Echocardiogram 2D Echocardiogram has been performed.  Lucendia Herrlich 02/04/2024, 11:20 AM

## 2024-02-04 NOTE — Telephone Encounter (Signed)
 Copied from CRM [id number]. Topic: Clinical - Medical Advice >> Feb 04, 2024  8:16 AM Elizebeth Brooking wrote: Reason for CRM: Patient called in stating she will like Dr.Jordan to know that she is in the hospital, had a heart attack, and all the information is in [place] to review

## 2024-02-04 NOTE — Telephone Encounter (Signed)
 Patient Product/process development scientist completed.    The patient is insured through [employer]. Patient has Medicare and is not eligible for a copay card, but may be able to apply for patient assistance or Medicare RX Payment Plan (Patient Must reach out to their plan, if eligible for payment plan), if available.    Ran test claim for Eliquis 5 mg and the current 30 day co-pay is $251.14 due to a $250.00 deductible.  Will be $47.00 once deductible is met.  Ran test claim for Xarelto 20 mg and the current 30 day co-pay is $251.14 due to a $250.00 deductible.  Will be $47.00 once deductible is met.  This test claim was processed through [facility]- copay amounts may vary at other pharmacies due to pharmacy/plan contracts, or as the patient moves through the different stages of their insurance plan.     Roland Earl, CPHT Pharmacy Technician III Certified Patient Advocate [facility] Patient Advocate Team Direct Number: [phone]  Fax: [phone]

## 2024-02-04 NOTE — Progress Notes (Signed)
   Patient Name: Annette [name] Date of Encounter: 02/04/2024 [facility] Cardiologist: Little Ishikawa, MD   Interval Summary  .    Patient feeling much better this morning.  No chest pain or shortness of breath.  Vital Signs .    Vitals:   02/04/24 0500 02/04/24 0600 02/04/24 0700 02/04/24 0828  BP:  (!) 147/82    Pulse: (!) 57 (!) 52 65   Resp: 18 15 19    Temp:    97.7 F (36.5 C)  TempSrc:   Oral Oral  SpO2: 98% 100% 96%   Weight:      Height:        Intake/Output Summary (Last 24 hours) at 02/04/2024 0848 Last data filed at 02/04/2024 0600 Gross per 24 hour  Intake 461.82 ml  Output 350 ml  Net 111.82 ml      02/03/2024    4:00 PM 01/22/2024    8:48 AM 01/06/2024    8:15 AM  Last 3 Weights  Weight (lbs) 143 lb 1.3 oz 134 lb 6.4 oz 132 lb  Weight (kg) 64.9 kg 60.963 kg 59.875 kg      Telemetry/ECG    Atrial fibrillation with controlled ventricular rate- Personally Reviewed  Physical Exam .   GEN: No acute distress.   Neck: No JVD Cardiac: Irregularly irregular, no murmurs, rubs, or gallops.  Respiratory: Clear to auscultation bilaterally. GI: Soft, nontender, non-distended  MS: No edema  Assessment & Plan .     1.  Acute Takotsubo syndrome: The patient on low-dose aspirin, losartan, and metoprolol.  Mild high-sensitivity troponin elevation from 20 up to 250.  Echocardiogram is pending.  LVEDP 10 mmHg.  Patient on low doses of metoprolol and losartan will continue.  No indication for loop diuretic at this point. 2.  Permanent atrial fibrillation: The patient has been off of anticoagulation for 2 to 3 months because she cannot afford it.  States she is only taking aspirin.  I initiated a conversation about warfarin with her yesterday but she did not seem inclined or interested in taking it despite her stroke risk.  We really looked at her cost and her co-pay will be $250 for Eliquis or Xarelto until her deductible is met then will be $47 monthly.   Heart rate is controlled on metoprolol.  She asked me to discuss cost issues with her son who is coming this afternoon. 3.  Chronic respiratory failure on home O2.  Stable, continue O2.  Disposition: Consider transfer out of 2H to telemetry bed this afternoon.  Anticipate home tomorrow.  For questions or updates, please contact [facility] Please consult [email or web address] for contact info under        Signed, Tonny Bollman, MD

## 2024-02-05 ENCOUNTER — Other Ambulatory Visit (HOSPITAL_COMMUNITY): Payer: Self-pay

## 2024-02-05 DIAGNOSIS — I5181 Takotsubo syndrome: Secondary | ICD-10-CM | POA: Diagnosis not present

## 2024-02-05 LAB — BASIC METABOLIC PANEL
Anion gap: 8 (ref 5–15)
BUN: 19 mg/dL (ref 8–23)
CO2: 29 mmol/L (ref 22–32)
Calcium: 9.1 mg/dL (ref 8.9–10.3)
Chloride: 85 mmol/L — ABNORMAL LOW (ref 98–111)
Creatinine, Ser: 0.84 mg/dL (ref 0.44–1.00)
GFR, Estimated: >60 mL/min (ref >60)
Glucose, Bld: 129 mg/dL — ABNORMAL HIGH (ref 70–99)
Potassium: 3.9 mmol/L (ref 3.5–5.1)
Sodium: 122 mmol/L — ABNORMAL LOW (ref 135–145)

## 2024-02-05 LAB — CBC
HCT: 36.3 % (ref 36.0–46.0)
Hemoglobin: 12.2 g/dL (ref 12.0–15.0)
MCH: 30.7 pg (ref 26.0–34.0)
MCHC: 33.6 g/dL (ref 30.0–36.0)
MCV: 91.4 fL (ref 80.0–100.0)
Platelets: 118 10*3/uL — ABNORMAL LOW (ref 150–400)
RBC: 3.97 MIL/uL (ref 3.87–5.11)
RDW: 12.6 % (ref 11.5–15.5)
WBC: 5.2 10*3/uL (ref 4.0–10.5)
nRBC: 0 % (ref 0.0–0.2)

## 2024-02-05 LAB — MAGNESIUM: Magnesium: 1.8 mg/dL (ref 1.7–2.4)

## 2024-02-05 LAB — LIPOPROTEIN A (LPA): Lipoprotein (a): 13.7 nmol/L (ref <75.0)

## 2024-02-05 MED ORDER — APIXABAN 5 MG PO TABS
5.0000 mg | ORAL_TABLET | Freq: Two times a day (BID) | ORAL | 2 refills | Status: DC
  Filled 2024-02-05: qty 60, 30d supply, fill #0

## 2024-02-05 NOTE — TOC CM/SW Note (Addendum)
 Late Entry: 1421 Case Manager received notification from [facility] regarding cost for Eliquis. Co pay was $251.14 due to 250.00 deductible and the patient declined to pay this amount. Pharmacy offered to place the amount on an account for the patient to receive her medications today; however, patient still did not want to pay. MD and NP aware that patient declined medication.

## 2024-02-05 NOTE — Progress Notes (Signed)
   Patient Name: Annette [name] Date of Encounter: 02/05/2024 [facility] Cardiologist: Little Ishikawa, MD   Interval Summary  .    Doing well this morning and eager to go home.  No chest pain or shortness of breath at rest.  Vital Signs .    Vitals:   02/05/24 0500 02/05/24 0530 02/05/24 0600 02/05/24 0700  BP: 118/72 126/62 118/63 136/86  Pulse: (!) 53 (!) 46 (!) 53 68  Resp: 15 (!) 23 (!) 28 (!) 23  Temp:    97.9 F (36.6 C)  TempSrc:    Oral  SpO2: 98% 92% 100% 95%  Weight:      Height:        Intake/Output Summary (Last 24 hours) at 02/05/2024 0831 Last data filed at 02/05/2024 0600 Gross per 24 hour  Intake --  Output 1750 ml  Net -1750 ml      02/03/2024    4:00 PM 01/22/2024    8:48 AM 01/06/2024    8:15 AM  Last 3 Weights  Weight (lbs) 143 lb 1.3 oz 134 lb 6.4 oz 132 lb  Weight (kg) 64.9 kg 60.963 kg 59.875 kg      Telemetry/ECG    Afib, HR 50's-60's, occasional 40's during sleep - Personally Reviewed  Physical Exam .   GEN: No acute distress.  Elderly woman.  Neck: No JVD Cardiac: irregularly irregular, no murmurs, rubs, or gallops.  Respiratory: Clear to auscultation bilaterally. GI: Soft, nontender, non-distended  MS: No edema  Assessment & Plan .     Acute takotsubo syndrome: chest pain resolved. Stable for home today. Continue low dose beta blocker and ARB.  Permanent atrial fibrillation. Continue low-dose metoprolol. Pt bradycardic but asymptomatic. May need to DC if issues in outpatient setting but she has tolerated well in the past.  Will continue for now.  Will discharge on apixaban.  I am going to talk to her son when he arrives about cost issues.  We can see if we can get them some samples potentially.  The patient is not sure that she wants to continue on blood thinning medicines and I have counseled her on the pros and cons and understand that it is her decision at her advanced age. Chronic respiratory failure on home O2.   Hyponatremia: Sodium is lower today.  I am going to stop her diuretic therapy altogether.  She was only getting furosemide every other day but this will be stopped and we need to repeat sodium level early next week.  On admission her sodium was 127 and today it is 122.  Dispo: medically stable for DC today.   For questions or updates, please contact [facility] Please consult [email or web address] for contact info under        Signed, Tonny Bollman, MD

## 2024-02-05 NOTE — Discharge Summary (Addendum)
 Discharge Summary    Patient ID: Annette [name] MRN: [id number]; DOB: Jun 19, 1930  Admit date: 02/03/2024 Discharge date: 02/05/2024  PCP:  [place], [name], MD   [facility] Providers Cardiologist:  Little Ishikawa, MD     Discharge Diagnoses    Principal Problem:   Takotsubo cardiomyopathy Active Problems:   Persistent atrial fibrillation with rapid ventricular response [facility])   Hyperlipidemia   Diagnostic Studies/Procedures    Left Heart Catheterization 02/03/24: Hemodynamic data: LVEDP 10 mmHg.  There is no pressure gradient across the aortic valve.   Angiographic data: LV: Apical inferior and mid to distal inferior hypokinesis.  No significant MR. Normal coronary arteries, right dominant circulation.  Slow filling noted in all the coronary arteries. LAD: Large D1 which is severely tortuous, large D2, LAD ends before reaching the apex. CX: Large OM1 and moderate to large size OM 2, tortuosity again noted. RCA: Large-caliber vessel nondominant vessel, smooth and normal.      Impression and recommendations: No evidence of coronary artery disease.  Suspect Takotsubo cardiomyopathy.  Echo: 02/04/2024  IMPRESSIONS     1. Left ventricular ejection fraction, by estimation, is 55 to 60%. The  left ventricle has normal function. The left ventricle has no regional  wall motion abnormalities. There is mild concentric left ventricular  hypertrophy. Left ventricular diastolic  parameters are indeterminate.   2. Right ventricular systolic function is mildly reduced. The right  ventricular size is severely enlarged. There is severely elevated  pulmonary artery systolic pressure. The estimated right ventricular  systolic pressure is 65.4 mmHg.   3. Left atrial size was severely dilated.   4. Right atrial size was severely dilated.   5. The mitral valve is normal in structure. Moderate mitral valve  regurgitation. No evidence of mitral stenosis.   6.  Massive, atrial functional, tricuspid regurgitation with vena  contracta width 17 mm and IVC diameter greater than 25 mm. Tricuspid valve  regurgitation is severe.   7. The aortic valve is tricuspid. There is mild calcification of the  aortic valve. Aortic valve regurgitation is trivial. Aortic valve  sclerosis is present, with no evidence of aortic valve stenosis.   8. Multiple jets. Pulmonic valve regurgitation is moderate to severe.   Comparison(s): Prior images reviewed side by side. Both mitral and  tricuspid valve regurgitation are worse.   FINDINGS   Left Ventricle: No ventricular 3D or strain performed. Left ventricular  ejection fraction, by estimation, is 55 to 60%. The left ventricle has  normal function. The left ventricle has no regional wall motion  abnormalities. Strain was performed and the  global longitudinal strain is indeterminate. The left ventricular internal  cavity size was small. There is mild concentric left ventricular  hypertrophy. Left ventricular diastolic parameters are indeterminate.   Right Ventricle: The right ventricular size is severely enlarged. No  increase in right ventricular wall thickness. Right ventricular systolic  function is mildly reduced. There is severely elevated pulmonary artery  systolic pressure. The tricuspid  regurgitant velocity is 3.55 m/s, and with an assumed right atrial  pressure of 15 mmHg, the estimated right ventricular systolic pressure is  65.4 mmHg.   Left Atrium: Left atrial size was severely dilated.   Right Atrium: Right atrial size was severely dilated.   Pericardium: There is no evidence of pericardial effusion.   Mitral Valve: The mitral valve is normal in structure. Moderate mitral  valve regurgitation. No evidence of mitral valve stenosis.   Tricuspid Valve:  Massive, atrial functional, tricuspid regurgitation with  vena contracta width 17 mm and IVC diameter greater than 25 mm. The  tricuspid valve is  normal in structure. Tricuspid valve regurgitation is  severe. No evidence of tricuspid  stenosis. The flow in the hepatic veins is reversed during ventricular  systole.   Aortic Valve: The aortic valve is tricuspid. There is mild calcification  of the aortic valve. Aortic valve regurgitation is trivial. Aortic  regurgitation PHT measures 858 msec. Aortic valve sclerosis is present,  with no evidence of aortic valve stenosis.   Aortic valve peak gradient measures 3.6 mmHg.   Pulmonic Valve: Multiple jets. The pulmonic valve was normal in structure.  Pulmonic valve regurgitation is moderate to severe. No evidence of  pulmonic stenosis.   Aorta: The aortic root and ascending aorta are structurally normal, with  no evidence of dilitation.   IAS/Shunts: No atrial level shunt detected by color flow Doppler.   Additional Comments: 3D was performed not requiring image post processing  on an independent workstation and was indeterminate.   _____________   History of Present Illness     Annette [name] is a 89 y.o. female with hypertension, GERD, PE, hyperparathyroidism, COPD with chronic hypoxemic respiratory failure on 24-hour 7 days 3 L oxygen by nasal cannula, permanent atrial fibrillation who was seen 02/03/2024 for the evaluation of chest pain/STEMI.   She has been followed by Dr. Bjorn Pippin as an outpatient.  Admitted 10/2019 with a submassive PE and placed on IV heparin with transition to Eliquis.  Echocardiogram at that time showed normal biventricular function with no significant valvular disease.  She was treated with Eliquis for 6 months then stopped. Wore a Zio patch for 3 days 03/2021 which showed 100% atrial fibrillation burden with average heart rate of 70.  Echocardiogram at that time showed normal biventricular function, mild to moderate TR, mild MR, PFO.   Admitted 12/2021 with acute hypoxic respiratory failure, on presentation was in A-fib RVR of 110s.  CT chest showed small  chronic PE with right pleural effusion, emphysematous changes throughout.  She was diuresed with IV Lasix and resumed on home Lasix 40 mg 3 times a week.    Last seen in the clinic 10/2023 reported doing well.  She was continued on metoprolol 12.5 mg twice daily, Eliquis 5 mg twice daily, Lasix 20 mg daily, losartan 25 mg daily.   She was doing well until the morning of admission, around 10 AM started having chest pain with radiation to the back.  She waited for some time and around noon she felt chest discomfort was even worse and eventually EMS was activated.  Upon presentation to the emergency room, still having nausea associated with chest pain with radiation to the back.  Dr. Jacinto Halim called and spoke to her son Loraine Leriche over the telephone, advised him that her EKG showed inferolateral STEMI and she needed to be taken emergently go to the cardiac catheterization lab to evaluate coronary anatomy.  He agreed to proceed with procedure to make a comfortable however stated that she was a DNR and she did not have any resuscitative measures done in case of complications.  Patient also agreed upon this.   Hospital Course     Chest pain with abnormal EKG Takotsubo cardiomyopathy Severe tricuspid regurgitation --Initial EKG on admission concerning for acute inferior/anterior lateral STEMI.  She underwent cardiac catheterization with no evidence of coronary disease.  LV gram showed apical inferior and mid to distal inferior hypokinesis with  concern for possible Takotsubo cardiomyopathy. hsTn 20>>250.  -- Echocardiogram showed LVEF of 55 to 60%, severe RV enlargement, severe biatrial enlargement, moderate MR, severe tricuspid regurgitation -- No further episodes of chest pain during admission, continue on low-dose beta-blocker and ARB  Permanent atrial fibrillation --Bradycardic at baseline but asymptomatic.  Continue low-dose metoprolol at discharge but may need to discontinue as an outpatient if she develops  symptoms. -- Prescribed Eliquis 5 mg twice daily but she refused 2/2 to cost (not a good candidate for coumadin) risks of no OAC were discussed with the patient by MD  Chronic respiratory failure on home O2 -- Remains on home regimen of 3 L nasal cannula  Hyponatremia -- Sodium 127>> 128>> 126>> 122 -- Was receiving p.o. Lasix while inpatient, this will be discontinued at the time of discharge -- BMET next week at follow up  Patient was seen by Dr. Excell Seltzer and deemed stable for discharge home.  Follow-up arranged in the office.  Medication sent to [facility] pharmacy.  Educated by [employer].D. prior to discharge.  Did the patient have an acute coronary syndrome (MI, NSTEMI, STEMI, etc) this admission?:  No                               Did the patient have a percutaneous coronary intervention (stent / angioplasty)?:  No.        The patient will be scheduled for a TOC follow up appointment in 10-14 days.  A message has been sent to the [employer] and Scheduling Pool at the office where the patient should be seen for follow up.  _____________  Discharge Vitals Blood pressure 128/86, pulse (!) 58, temperature (!) 97.5 F (36.4 C), temperature source Oral, resp. rate (!) 22, height 5' 2.5" (1.588 m), weight 64.9 kg, SpO2 100%.  Filed Weights   02/03/24 1600  Weight: 64.9 kg    Labs & Radiologic Studies    CBC Recent Labs    02/03/24 1445 02/03/24 1500 02/04/24 0919 02/05/24 0808  WBC 5.4  --  9.1 5.2  NEUTROABS 3.9  --   --   --   HGB 12.8   < > 13.5 12.2  HCT 38.2   < > 39.9 36.3  MCV 92.5  --  93.2 91.4  PLT 152  --  130* 118*   < > = values in this interval not displayed.   Basic Metabolic Panel Recent Labs    08/65/78 0919 02/04/24 0924 02/05/24 0808  NA 126*  --  122*  K 4.6  --  3.9  CL 87*  --  85*  CO2 28  --  29  GLUCOSE 126*  --  129*  BUN 18  --  19  CREATININE 0.86  --  0.84  CALCIUM 9.6  --  9.1  MG  --  1.8 1.8   Liver Function Tests Recent Labs     02/03/24 1445  AST 23  ALT 19  ALKPHOS 56  BILITOT 1.1  PROT 5.8*  ALBUMIN 3.7   No results for input(s): "LIPASE", "AMYLASE" in the last 72 hours. High Sensitivity Troponin:   Recent Labs  Lab 02/03/24 1445 02/03/24 1625  TROPONINIHS 20* 250*    BNP Invalid input(s): "POCBNP" D-Dimer No results for input(s): "DDIMER" in the last 72 hours. Hemoglobin A1C Recent Labs    02/03/24 1445  HGBA1C 4.9   Fasting Lipid Panel Recent Labs  02/03/24 1445  CHOL 112  HDL 59  LDLCALC 42  TRIG 53  CHOLHDL 1.9   Thyroid Function Tests No results for input(s): "TSH", "T4TOTAL", "T3FREE", "THYROIDAB" in the last 72 hours.  Invalid input(s): "FREET3" _____________  ECHOCARDIOGRAM COMPLETE Result Date: 02/04/2024    ECHOCARDIOGRAM REPORT   Patient Name:   Annette [name] Date of Exam: 02/04/2024 Medical Rec #:  [id number]       Height:       62.5 in Accession #:    [id number]      Weight:       143.1 lb Date of Birth:  Mar 19, 1930      BSA:          1.668 m Patient Age:    89 years        BP:           147/82 mmHg Patient Gender: F               HR:           65 bpm. Exam Location:  Inpatient Procedure: 2D Echo, Cardiac Doppler and Color Doppler (Both Spectral and Color            Flow Doppler were utilized during procedure). Indications:    Takotsubo Cardiomyopathy  History:        Patient has prior history of Echocardiogram examinations, most                 recent 03/30/2023. CHF, Acute MI, Arrythmias:Atrial Fibrillation,                 Signs/Symptoms:Syncope; Risk Factors:Hypertension and                 Dyslipidemia.  Sonographer:    Lucendia Herrlich RCS Referring Phys: [id number] IMPRESSIONS  1. Left ventricular ejection fraction, by estimation, is 55 to 60%. The left ventricle has normal function. The left ventricle has no regional wall motion abnormalities. There is mild concentric left ventricular hypertrophy. Left ventricular diastolic parameters are indeterminate.  2. Right  ventricular systolic function is mildly reduced. The right ventricular size is severely enlarged. There is severely elevated pulmonary artery systolic pressure. The estimated right ventricular systolic pressure is 65.4 mmHg.  3. Left atrial size was severely dilated.  4. Right atrial size was severely dilated.  5. The mitral valve is normal in structure. Moderate mitral valve regurgitation. No evidence of mitral stenosis.  6. Massive, atrial functional, tricuspid regurgitation with vena contracta width 17 mm and IVC diameter greater than 25 mm. Tricuspid valve regurgitation is severe.  7. The aortic valve is tricuspid. There is mild calcification of the aortic valve. Aortic valve regurgitation is trivial. Aortic valve sclerosis is present, with no evidence of aortic valve stenosis.  8. Multiple jets. Pulmonic valve regurgitation is moderate to severe. Comparison(s): Prior images reviewed side by side. Both mitral and tricuspid valve regurgitation are worse. FINDINGS  Left Ventricle: No ventricular 3D or strain performed. Left ventricular ejection fraction, by estimation, is 55 to 60%. The left ventricle has normal function. The left ventricle has no regional wall motion abnormalities. Strain was performed and the global longitudinal strain is indeterminate. The left ventricular internal cavity size was small. There is mild concentric left ventricular hypertrophy. Left ventricular diastolic parameters are indeterminate. Right Ventricle: The right ventricular size is severely enlarged. No increase in right ventricular wall thickness. Right ventricular systolic function is mildly reduced. There is severely elevated pulmonary artery systolic  pressure. The tricuspid regurgitant velocity is 3.55 m/s, and with an assumed right atrial pressure of 15 mmHg, the estimated right ventricular systolic pressure is 65.4 mmHg. Left Atrium: Left atrial size was severely dilated. Right Atrium: Right atrial size was severely dilated.  Pericardium: There is no evidence of pericardial effusion. Mitral Valve: The mitral valve is normal in structure. Moderate mitral valve regurgitation. No evidence of mitral valve stenosis. Tricuspid Valve: Massive, atrial functional, tricuspid regurgitation with vena contracta width 17 mm and IVC diameter greater than 25 mm. The tricuspid valve is normal in structure. Tricuspid valve regurgitation is severe. No evidence of tricuspid stenosis. The flow in the hepatic veins is reversed during ventricular systole. Aortic Valve: The aortic valve is tricuspid. There is mild calcification of the aortic valve. Aortic valve regurgitation is trivial. Aortic regurgitation PHT measures 858 msec. Aortic valve sclerosis is present, with no evidence of aortic valve stenosis.  Aortic valve peak gradient measures 3.6 mmHg. Pulmonic Valve: Multiple jets. The pulmonic valve was normal in structure. Pulmonic valve regurgitation is moderate to severe. No evidence of pulmonic stenosis. Aorta: The aortic root and ascending aorta are structurally normal, with no evidence of dilitation. IAS/Shunts: No atrial level shunt detected by color flow Doppler. Additional Comments: 3D was performed not requiring image post processing on an independent workstation and was indeterminate.  LEFT VENTRICLE PLAX 2D LVIDd:         3.60 cm   Diastology LVIDs:         2.30 cm   LV e' medial:    8.84 cm/s LV PW:         1.20 cm   LV E/e' medial:  12.4 LV IVS:        1.10 cm   LV e' lateral:   10.30 cm/s LVOT diam:     2.00 cm   LV E/e' lateral: 10.7 LV SV:         38 LV SV Index:   23 LVOT Area:     3.14 cm  RIGHT VENTRICLE             IVC RV S prime:     14.60 cm/s  IVC diam: 3.00 cm TAPSE (M-mode): 1.9 cm LEFT ATRIUM              Index        RIGHT ATRIUM           Index LA diam:        5.70 cm  3.42 cm/m   RA Area:     34.90 cm LA Vol (A2C):   106.0 ml 63.55 ml/m  RA Volume:   143.00 ml 85.73 ml/m LA Vol (A4C):   119.0 ml 71.34 ml/m LA Biplane Vol:  116.0 ml 69.55 ml/m  AORTIC VALVE AV Area (Vmax): 2.27 cm AV Vmax:        95.25 cm/s AV Peak Grad:   3.6 mmHg LVOT Vmax:      68.90 cm/s LVOT Vmean:     41.233 cm/s LVOT VTI:       0.122 m AI PHT:         858 msec  AORTA Ao Root diam: 2.90 cm Ao Asc diam:  3.70 cm MITRAL VALVE                TRICUSPID VALVE MV Area (PHT): 5.02 cm     TR Peak grad:   50.4 mmHg MV Decel Time: 151 msec     TR  Vmax:        355.00 cm/s MR Peak grad: 103.4 mmHg MR Vmax:      508.50 cm/s   SHUNTS MV E velocity: 110.00 cm/s  Systemic VTI:  0.12 m MV A velocity: 34.40 cm/s   Systemic Diam: 2.00 cm MV E/A ratio:  3.20 Riley Lam MD Electronically signed by Riley Lam MD Signature Date/Time: 02/04/2024/12:41:45 PM    Final    CARDIAC CATHETERIZATION Result Date: 02/03/2024 Images from the original result were not included. Left Heart Catheterization 02/03/24: Hemodynamic data: LVEDP 10 mmHg.  There is no pressure gradient across the aortic valve. Angiographic data: LV: Apical inferior and mid to distal inferior hypokinesis.  No significant MR. Normal coronary arteries, right dominant circulation.  Slow filling noted in all the coronary arteries. LAD: Large D1 which is severely tortuous, large D2, LAD ends before reaching the apex. CX: Large OM1 and moderate to large size OM 2, tortuosity again noted. RCA: Large-caliber vessel nondominant vessel, smooth and normal. Impression and recommendations: No evidence of coronary artery disease.  Suspect Takotsubo cardiomyopathy.   Disposition   Pt is being discharged home today in good condition.  Follow-up Plans & Appointments     Discharge Instructions     Call MD for:  difficulty breathing, headache or visual disturbances   Complete by: As directed    Call MD for:  persistant dizziness or light-headedness   Complete by: As directed    Call MD for:  redness, tenderness, or signs of infection (pain, swelling, redness, odor or green/yellow discharge around  incision site)   Complete by: As directed    Diet - low sodium heart healthy   Complete by: As directed    Discharge instructions   Complete by: As directed    Radial Site Care Refer to this sheet in the next few weeks. These instructions provide you with information on caring for yourself after your procedure. Your caregiver may also give you more specific instructions. Your treatment has been planned according to current medical practices, but problems sometimes occur. Call your caregiver if you have any problems or questions after your procedure. HOME CARE INSTRUCTIONS You may shower the day after the procedure. Remove the bandage (dressing) and gently wash the site with plain soap and water. Gently pat the site dry.  Do not apply powder or lotion to the site.  Do not submerge the affected site in water for 3 to 5 days.  Inspect the site at least twice daily.  Do not flex or bend the affected arm for 24 hours.  No lifting over 5 pounds (2.3 kg) for 5 days after your procedure.  Do not drive home if you are discharged the same day of the procedure. Have someone else drive you.  You may drive 24 hours after the procedure unless otherwise instructed by your caregiver.  What to expect: Any bruising will usually fade within 1 to 2 weeks.  Blood that collects in the tissue (hematoma) may be painful to the touch. It should usually decrease in size and tenderness within 1 to 2 weeks.  SEEK IMMEDIATE MEDICAL CARE IF: You have unusual pain at the radial site.  You have redness, warmth, swelling, or pain at the radial site.  You have drainage (other than a small amount of blood on the dressing).  You have chills.  You have a fever or persistent symptoms for more than 72 hours.  You have a fever and your symptoms suddenly get worse.  Your arm becomes pale, cool, tingly, or numb.  You have heavy bleeding from the site. Hold pressure on the site.   Increase activity slowly   Complete by: As  directed         Discharge Medications   Allergies as of 02/05/2024       Reactions   Codeine Nausea And Vomiting        Medication List     STOP taking these medications    furosemide 20 MG tablet Commonly known as: LASIX       TAKE these medications    acetaminophen 500 MG tablet Commonly known as: TYLENOL Take 500 mg by mouth in the morning and at bedtime.   cinacalcet 30 MG tablet Commonly known as: SENSIPAR TAKE 1 TABLET EVERY DAY   cyanocobalamin 1000 MCG tablet Commonly known as: VITAMIN B12 Take 1 tablet (1,000 mcg total) by mouth 2 (two) times a week. Tuesday and thursday   latanoprost 0.005 % ophthalmic solution Commonly known as: XALATAN INSTILL 1 DROP INTO BOTH EYES AT BEDTIME   losartan 25 MG tablet Commonly known as: COZAAR TAKE 1 TABLET (25 MG TOTAL) BY MOUTH DAILY.   magnesium oxide 400 (240 Mg) MG tablet Commonly known as: MAG-OX TAKE 1 TABLET EVERY DAY   metoprolol tartrate 25 MG tablet Commonly known as: LOPRESSOR Take 12.5 mg by mouth 2 (two) times daily.   omeprazole 40 MG capsule Commonly known as: PRILOSEC TAKE 1 CAPSULE EVERY DAY   OXYGEN Inhale 2 mLs into the lungs daily.   pravastatin 20 MG tablet Commonly known as: PRAVACHOL TAKE 1 TABLET EVERY DAY   VITAMIN D PO Take 1,000 Units by mouth at bedtime.        Outstanding Labs/Studies   BMET at follow up appt   Duration of Discharge Encounter: APP Time: 20 minutes   Signed, Laverda Page, NP 02/05/2024, 1:20 PM  Patient seen, examined. Available data reviewed. Agree with findings, assessment, and plan as outlined by Laverda Page, NP.  The patient is independently interviewed and examined this morning.  Please see my progress note the same date for full details.  I agree with the discharge instructions outlined above including the medication list which I have personally reviewed.  My exam is outlined in my other note the same date, but she is an elderly  woman in no distress with no JVD.  Lungs are clear, heart is irregularly irregular with no murmur gallop, extremities have no edema.  For her Takotsubo syndrome, she has had no recurrent chest pain.  She will continue on a low-dose beta-blocker and ARB.  For her permanent atrial fibrillation we will continue her on low-dose metoprolol.  We discharged her on apixaban but she is not going to fill the prescription.  I had a lengthy discussion with her about her stroke risk.  She states that at her advanced age and with the high cost of the medicine, she will not take it.  She is not a candidate for warfarin due to her age, comorbidities, and inability to manage the INR levels.  She is not interested in anticoagulation.  Please see my other note for details of this discussion.  For her chronic respiratory failure she will remain on home O2 as she is done and there has been no acute exacerbation.  We will arrange follow-up metabolic panel because of hyponatremia.  Her sodium this admission is decreased from 127-122.  Her diuretics are held.  Otherwise, as outlined above.  MD time conducting this discharge is 30 minutes.  This includes review of her labs, review of her medication program and discussion with pharmacy, discussion about anticoagulation pros and cons with the patient, conducting the physical exam, and counseling the patient regarding discharge instructions.  Tonny Bollman, M.D. 02/05/2024 5:12 PM

## 2024-02-06 ENCOUNTER — Telehealth: Payer: Self-pay

## 2024-02-06 NOTE — Transitions of Care (Post Inpatient/ED Visit) (Signed)
   02/06/2024  Name: Annette [name] MRN: [id number] DOB: 1930-02-26  Today's TOC FU Call Status: Today's TOC FU Call Status:: Unsuccessful Call (1st Attempt) Unsuccessful Call (1st Attempt) Date: 02/06/24  Attempted to reach the patient regarding the most recent Inpatient/ED visit. Specialist appointment for hospital follow up scheduled for 02/13/24.   Follow Up Plan: Additional outreach attempts will be made to reach the patient to complete the Transitions of Care (Post Inpatient/ED visit) call.    Gabriel Cirri MSN, RN RN [employer]  [facility] Office Hours Wed/Thur  8:00 am-6:00 pm Direct Dial: [phone] Main Phone [phone]  Fax: [phone] [email or web address]

## 2024-02-07 ENCOUNTER — Telehealth: Payer: Self-pay

## 2024-02-07 NOTE — Transitions of Care (Post Inpatient/ED Visit) (Cosign Needed)
 02/07/2024  Name: Annette [name] MRN: [id number] DOB: Dec 30, 1929  Today's TOC FU Call Status: Today's TOC FU Call Status:: Successful TOC FU Call Completed TOC FU Call Complete Date: 02/07/24 Patient's Name and Date of Birth confirmed.  Transition Care Management Follow-up Telephone Call How have you been since you were released from the hospital?: Better (denies pain. reports sleeping well) Any questions or concerns?: No  Items Reviewed: Did you receive and understand the discharge instructions provided?: Yes Medications obtained,verified, and reconciled?: Yes (Medications Reviewed) Any new allergies since your discharge?: No Dietary orders reviewed?: Yes Type of Diet Ordered:: low sodium Do you have support at home?: Yes People in Home: child(ren), adult Name of Support/Comfort Primary Source: son and daughter in law live with patient  Medications Reviewed Today: Medications Reviewed Today     Reviewed by Earlie Server, RN (Registered Nurse) on 02/07/24 at 1242  Med List Status: <None>   Medication Order Taking? Sig Documenting Provider Last Dose Status Informant  acetaminophen (TYLENOL) 500 MG tablet [id number] Yes Take 500 mg by mouth in the morning and at bedtime. [provider] Taking Active Self           Med Note (SATTERFIELD, DARIUS E   Tue Feb 04, 2024  6:15 PM) Daughter in law verified patient is taking every day   apixaban (ELIQUIS) 5 MG TABS tablet [id number] Yes Take 5 mg by mouth 2 (two) times daily. [provider]  Active            Med Note (ROSE, Lanell Matar   Fri Feb 07, 2024 12:42 PM) Not taking due to cost and does not want any assistance with cost  cinacalcet (SENSIPAR) 30 MG tablet [id number] Yes TAKE 1 TABLET EVERY DAY Carlus Pavlov, MD Taking Active Self  cyanocobalamin (VITAMIN B12) 1000 MCG tablet [id number] Yes Take 1 tablet (1,000 mcg total) by mouth 2 (two) times a week. Tuesday and thursday [place], [name], MD Taking Active Self   latanoprost (XALATAN) 0.005 % ophthalmic solution [id number] Yes INSTILL 1 DROP INTO BOTH EYES AT BEDTIME Gordy Savers, MD Taking Active Self  losartan (COZAAR) 25 MG tablet [id number] Yes TAKE 1 TABLET (25 MG TOTAL) BY MOUTH DAILY. [place], [name], MD Taking Active Self  magnesium oxide (MAG-OX) 400 (240 Mg) MG tablet [id number] Yes TAKE 1 TABLET EVERY DAY Little Ishikawa, MD Taking Active Self  metoprolol tartrate (LOPRESSOR) 25 MG tablet [id number] Yes Take 12.5 mg by mouth 2 (two) times daily. [provider] Taking Active Self  omeprazole (PRILOSEC) 40 MG capsule [id number] Yes TAKE 1 CAPSULE EVERY DAY [place], [name], MD Taking Active Self  OXYGEN [id number] Yes Inhale 2 mLs into the lungs daily. [provider] Taking Active Self  pravastatin (PRAVACHOL) 20 MG tablet [id number] Yes TAKE 1 TABLET EVERY DAY [place], [name], MD Taking Active Self  VITAMIN D PO [id number] Yes Take 1,000 Units by mouth at bedtime. [provider] Taking Active Self            Home Care and Equipment/Supplies: Were Home Health Services Ordered?: No Any new equipment or medical supplies ordered?: No  Functional Questionnaire: Do you need assistance with bathing/showering or dressing?: Yes (family) Do you need assistance with meal preparation?: Yes (family) Do you need assistance with eating?: No Do you have difficulty maintaining continence: No Do you need assistance with getting out of bed/getting out of a chair/moving?: No Do you have difficulty managing or taking your  medications?: No  Follow up appointments reviewed: PCP Follow-up appointment confirmed?: Yes Specialist Hospital Follow-up appointment confirmed?: Yes Date of Specialist follow-up appointment?: 02/26/24 Follow-Up Specialty Provider:: Gherghe Do you need transportation to your follow-up appointment?: No Do you understand care options if your condition(s) worsen?: Yes-patient verbalized  understanding  SDOH Interventions Today    Flowsheet Row Most Recent Value  SDOH Interventions   Food Insecurity Interventions Intervention Not Indicated  Housing Interventions Intervention Not Indicated  Transportation Interventions Intervention Not Indicated  Utilities Interventions Intervention Not Indicated      Interventions Today    Flowsheet Row Most Recent Value  Chronic Disease   Chronic disease during today's visit Other  [cardio myopathy]  General Interventions   General Interventions Discussed/Reviewed General Interventions Discussed, General Interventions Reviewed, Doctor Visits, Labs  Doctor Visits Discussed/Reviewed Doctor Visits Discussed, Doctor Visits Reviewed, PCP, Specialist  PCP/Specialist Visits Compliance with follow-up visit  Nutrition Interventions   Nutrition Discussed/Reviewed Nutrition Discussed  [encouraged healthy diet]  Pharmacy Interventions   Pharmacy Dicussed/Reviewed Medications and their functions, Affording Medications  [discussed in detail about referral to pharmacy for assistance with eliquis. Reviewed risk of not taking Eliquis.]  Safety Interventions   Safety Discussed/Reviewed Fall Risk, Home Safety      TOC Interventions Today    Flowsheet Row Most Recent Value  TOC Interventions   TOC Interventions Discussed/Reviewed TOC Interventions Discussed, TOC Interventions Reviewed       Spoke with patient who reports that she is resting in the bed. Reports she lives with son and daughter in law.  Patient reports she has all her follow up appointment and takes all her medications as prescribed. Reports that she is not taking her eliquis.  Reports that she can not afford this medications. Offered to place a referral to pharmacy and patient declined.   Reviewed and offered 30 day TOC program and patient declined.  Provided my contact information for patient to cal me if needed.  Lonia Chimera, RN, BSN, CEN [employer]- Transition of Care  Team.  [facility] [phone]

## 2024-02-10 ENCOUNTER — Encounter: Payer: Self-pay | Admitting: *Deleted

## 2024-02-10 NOTE — Telephone Encounter (Signed)
 Called patient and left message per Dr. Bjorn Pippin, to continue anticoagulant and referral has been made to Pharm D for Afib to explore alternatives. Left message to call office for any questions.

## 2024-02-13 ENCOUNTER — Ambulatory Visit: Admitting: Nurse Practitioner

## 2024-02-13 ENCOUNTER — Encounter: Payer: Self-pay | Admitting: Nurse Practitioner

## 2024-02-13 ENCOUNTER — Ambulatory Visit: Attending: Nurse Practitioner | Admitting: Nurse Practitioner

## 2024-02-13 VITALS — BP 138/82 | HR 62 | Ht 62.0 in | Wt 140.6 lb

## 2024-02-13 DIAGNOSIS — J9611 Chronic respiratory failure with hypoxia: Secondary | ICD-10-CM

## 2024-02-13 DIAGNOSIS — I10 Essential (primary) hypertension: Secondary | ICD-10-CM | POA: Diagnosis not present

## 2024-02-13 DIAGNOSIS — I4821 Permanent atrial fibrillation: Secondary | ICD-10-CM

## 2024-02-13 DIAGNOSIS — E871 Hypo-osmolality and hyponatremia: Secondary | ICD-10-CM

## 2024-02-13 DIAGNOSIS — Z86711 Personal history of pulmonary embolism: Secondary | ICD-10-CM

## 2024-02-13 DIAGNOSIS — J449 Chronic obstructive pulmonary disease, unspecified: Secondary | ICD-10-CM | POA: Diagnosis not present

## 2024-02-13 DIAGNOSIS — I5181 Takotsubo syndrome: Secondary | ICD-10-CM | POA: Diagnosis not present

## 2024-02-13 DIAGNOSIS — I340 Nonrheumatic mitral (valve) insufficiency: Secondary | ICD-10-CM

## 2024-02-13 DIAGNOSIS — I071 Rheumatic tricuspid insufficiency: Secondary | ICD-10-CM

## 2024-02-13 NOTE — Progress Notes (Signed)
 Office Visit    Patient Name: Annette [name] Date of Encounter: 02/13/2024  Primary Care Provider:  [place], [name], MD Primary Cardiologist:  Little Ishikawa, MD  Chief Complaint    89 year old female with a history of Takotsubo cardiomyopathy, permanent atrial fibrillation, mitral valve regurgitation, tricuspid valve regurgitation, PE, hypertension, hyperparathyroidism, COPD on chronic home O2 who presents for hospital follow-up s/p NSTEMI, Takotsubo event.  Past Medical History    Past Medical History:  Diagnosis Date   DVT (deep venous thrombosis) (HCC)    GERD 10/03/2007   HYPERTENSION 10/03/2007   OSTEOPENIA 02/03/2008   Primary hyperparathyroidism (HCC) 10/03/2007   Past Surgical History:  Procedure Laterality Date   ABDOMINAL HYSTERECTOMY     LEFT HEART CATH AND CORONARY ANGIOGRAPHY N/A 02/03/2024   Procedure: LEFT HEART CATH AND CORONARY ANGIOGRAPHY;  Surgeon: Yates Decamp, MD;  Location: [facility]ASIVE [facility];  Service: Cardiovascular;  Laterality: N/A;    Allergies  Allergies  Allergen Reactions   Codeine Nausea And Vomiting     Labs/Other Studies Reviewed    The following studies were reviewed today:  Cardiac Studies & Procedures   ______________________________________________________________________________________________ CARDIAC CATHETERIZATION  CARDIAC CATHETERIZATION 02/03/2024  Narrative Images from the original result were not included. Left Heart Catheterization 02/03/24: Hemodynamic data: LVEDP 10 mmHg.  There is no pressure gradient across the aortic valve.  Angiographic data: LV: Apical inferior and mid to distal inferior hypokinesis.  No significant MR. Normal coronary arteries, right dominant circulation.  Slow filling noted in all the coronary arteries. LAD: Large D1 which is severely tortuous, large D2, LAD ends before reaching the apex. CX: Large OM1 and moderate to large size OM 2, tortuosity again noted. RCA: Large-caliber  vessel nondominant vessel, smooth and normal.    Impression and recommendations: No evidence of coronary artery disease.  Suspect Takotsubo cardiomyopathy.  Findings Coronary Findings Diagnostic  Dominance: Right  No diagnostic findings have been documented. Intervention  No interventions have been documented.     ECHOCARDIOGRAM  ECHOCARDIOGRAM COMPLETE 02/04/2024  Narrative ECHOCARDIOGRAM REPORT    Patient Name:   Annette [name] Date of Exam: 02/04/2024 Medical Rec #:  [id number]       Height:       62.5 in Accession #:    [id number]      Weight:       143.1 lb Date of Birth:  [birth year]      BSA:          1.668 m Patient Age:    89 years        BP:           147/82 mmHg Patient Gender: F               HR:           65 bpm. Exam Location:  Inpatient  Procedure: 2D Echo, Cardiac Doppler and Color Doppler (Both Spectral and Color Flow Doppler were utilized during procedure).  Indications:    Takotsubo Cardiomyopathy  History:        Patient has prior history of Echocardiogram examinations, most recent 03/30/2023. CHF, Acute MI, Arrythmias:Atrial Fibrillation, Signs/Symptoms:Syncope; Risk Factors:Hypertension and Dyslipidemia.  Sonographer:    Lucendia Herrlich RCS Referring Phys: [id number] Yates Decamp  IMPRESSIONS   1. Left ventricular ejection fraction, by estimation, is 55 to 60%. The left ventricle has normal function. The left ventricle has no regional wall motion abnormalities. There is mild concentric left ventricular hypertrophy. Left ventricular diastolic parameters  are indeterminate. 2. Right ventricular systolic function is mildly reduced. The right ventricular size is severely enlarged. There is severely elevated pulmonary artery systolic pressure. The estimated right ventricular systolic pressure is 65.4 mmHg. 3. Left atrial size was severely dilated. 4. Right atrial size was severely dilated. 5. The mitral valve is normal in structure. Moderate mitral valve  regurgitation. No evidence of mitral stenosis. 6. Massive, atrial functional, tricuspid regurgitation with vena contracta width 17 mm and IVC diameter greater than 25 mm. Tricuspid valve regurgitation is severe. 7. The aortic valve is tricuspid. There is mild calcification of the aortic valve. Aortic valve regurgitation is trivial. Aortic valve sclerosis is present, with no evidence of aortic valve stenosis. 8. Multiple jets. Pulmonic valve regurgitation is moderate to severe.  Comparison(s): Prior images reviewed side by side. Both mitral and tricuspid valve regurgitation are worse.  FINDINGS Left Ventricle: No ventricular 3D or strain performed. Left ventricular ejection fraction, by estimation, is 55 to 60%. The left ventricle has normal function. The left ventricle has no regional wall motion abnormalities. Strain was performed and the global longitudinal strain is indeterminate. The left ventricular internal cavity size was small. There is mild concentric left ventricular hypertrophy. Left ventricular diastolic parameters are indeterminate.  Right Ventricle: The right ventricular size is severely enlarged. No increase in right ventricular wall thickness. Right ventricular systolic function is mildly reduced. There is severely elevated pulmonary artery systolic pressure. The tricuspid regurgitant velocity is 3.55 m/s, and with an assumed right atrial pressure of 15 mmHg, the estimated right ventricular systolic pressure is 65.4 mmHg.  Left Atrium: Left atrial size was severely dilated.  Right Atrium: Right atrial size was severely dilated.  Pericardium: There is no evidence of pericardial effusion.  Mitral Valve: The mitral valve is normal in structure. Moderate mitral valve regurgitation. No evidence of mitral valve stenosis.  Tricuspid Valve: Massive, atrial functional, tricuspid regurgitation with vena contracta width 17 mm and IVC diameter greater than 25 mm. The tricuspid valve is  normal in structure. Tricuspid valve regurgitation is severe. No evidence of tricuspid stenosis. The flow in the hepatic veins is reversed during ventricular systole.  Aortic Valve: The aortic valve is tricuspid. There is mild calcification of the aortic valve. Aortic valve regurgitation is trivial. Aortic regurgitation PHT measures 858 msec. Aortic valve sclerosis is present, with no evidence of aortic valve stenosis. Aortic valve peak gradient measures 3.6 mmHg.  Pulmonic Valve: Multiple jets. The pulmonic valve was normal in structure. Pulmonic valve regurgitation is moderate to severe. No evidence of pulmonic stenosis.  Aorta: The aortic root and ascending aorta are structurally normal, with no evidence of dilitation.  IAS/Shunts: No atrial level shunt detected by color flow Doppler.  Additional Comments: 3D was performed not requiring image post processing on an independent workstation and was indeterminate.   LEFT VENTRICLE PLAX 2D LVIDd:         3.60 cm   Diastology LVIDs:         2.30 cm   LV e' medial:    8.84 cm/s LV PW:         1.20 cm   LV E/e' medial:  12.4 LV IVS:        1.10 cm   LV e' lateral:   10.30 cm/s LVOT diam:     2.00 cm   LV E/e' lateral: 10.7 LV SV:         38 LV SV Index:   23 LVOT Area:  3.14 cm   RIGHT VENTRICLE             IVC RV S prime:     14.60 cm/s  IVC diam: 3.00 cm TAPSE (M-mode): 1.9 cm  LEFT ATRIUM              Index        RIGHT ATRIUM           Index LA diam:        5.70 cm  3.42 cm/m   RA Area:     34.90 cm LA Vol (A2C):   106.0 ml 63.55 ml/m  RA Volume:   143.00 ml 85.73 ml/m LA Vol (A4C):   119.0 ml 71.34 ml/m LA Biplane Vol: 116.0 ml 69.55 ml/m AORTIC VALVE AV Area (Vmax): 2.27 cm AV Vmax:        95.25 cm/s AV Peak Grad:   3.6 mmHg LVOT Vmax:      68.90 cm/s LVOT Vmean:     41.233 cm/s LVOT VTI:       0.122 m AI PHT:         858 msec  AORTA Ao Root diam: 2.90 cm Ao Asc diam:  3.70 cm  MITRAL VALVE                 TRICUSPID VALVE MV Area (PHT): 5.02 cm     TR Peak grad:   50.4 mmHg MV Decel Time: 151 msec     TR Vmax:        355.00 cm/s MR Peak grad: 103.4 mmHg MR Vmax:      508.50 cm/s   SHUNTS MV E velocity: 110.00 cm/s  Systemic VTI:  0.12 m MV A velocity: 34.40 cm/s   Systemic Diam: 2.00 cm MV E/A ratio:  3.20  Riley Lam MD Electronically signed by Riley Lam MD Signature Date/Time: 02/04/2024/12:41:45 PM    Final    MONITORS  LONG TERM MONITOR (3-14 DAYS) 06/12/2021  Narrative  100% Afib burden, average rate 75 bpm   Patch Wear Time:  3 days and 13 hours (2022-07-23T21:24:50-0400 to 2022-07-27T10:30:52-0400)  Atrial Fibrillation occurred continuously (100% burden), ranging from 50-138 bpm (avg of 75 bpm). Isolated VEs were rare (<1.0%), VE Couplets were rare (<1.0%), and no VE Triplets were present.       ______________________________________________________________________________________________     Recent Labs: 02/03/2024: ALT 19 02/05/2024: BUN 19; Creatinine, Ser 0.84; Hemoglobin 12.2; Magnesium 1.8; Platelets 118; Potassium 3.9; Sodium 122  Recent Lipid Panel    Component Value Date/Time   CHOL 112 02/03/2024 1445   TRIG 53 02/03/2024 1445   HDL 59 02/03/2024 1445   CHOLHDL 1.9 02/03/2024 1445   VLDL 11 02/03/2024 1445   LDLCALC 42 02/03/2024 1445   LDLDIRECT 121.2 06/17/2012 0931    History of Present Illness    89 year old female with the above past medical history including Takotsubo cardiomyopathy, permanent atrial fibrillation, mitral valve regurgitation, tricuspid valve regurgitation, PE, hypertension, hyperparathyroidism, COPD on chronic home O2.  She was hospitalized in December 2020 in the setting of submassive PE.  Echocardiogram at the time showed normal biventricular function, no significant valvular disease.  Zio patch in 03/2021 showed 100% A-fib burden, average heart rate 70 bpm.  Echocardiogram at that time showed normal  biventricular function, mild to moderate TR, mild MR, PFO.  She was hospitalized in February 2023 in the setting of acute hypoxic respiratory failure, A-fib with RVR.  CT of the chest showed small chronic PE  with right pleural effusion, emphysematous changes throughout.  She was diuresed with IV Lasix.  She has been maintained on chronic anticoagulation with Eliquis.  She was last seen in the office on 10/31/2023 and was stable from a cardiac standpoint.  She has a history of chronic hypoxic respiratory failure on home O2, follows with pulmonology.  She presented to the ED on 02/03/2024 in the setting of chest pain.  EKG showed inferolateral STEMI.  She underwent emergent cardiac catheterization which revealed no evidence of coronary artery disease.  LV gram showed apical inferior and mid to distal inferior hypokinesis with concern for possible Takotsubo cardiomyopathy.  Echocardiogram showed EF 55 to 60%, severe RV enlargement, severe biatrial enlargement, moderate MR, severe tricuspid valve regurgitation.  She was mildly bradycardic, metoprolol however was continued. She declined anticoagulation therapy due to cost. She was not felt to be a good candidate for warfarin.  She was discharged home in stable condition on 02/05/2024.  She presents today for follow-up accompanied by her son.  Since her hospitalization she has been stable overall from a cardiac standpoint.  She has stable chronic dyspnea, she denies chest pain, worsening dyspnea, palpitations, dizziness, edema, PND, orthopnea, weight gain.  She does note some generalized weakness, otherwise, she has been stable.  Home Medications    Current Outpatient Medications  Medication Sig Dispense Refill   acetaminophen (TYLENOL) 500 MG tablet Take 500 mg by mouth in the morning and at bedtime.     aspirin EC 81 MG tablet Take 81 mg by mouth daily. Swallow whole.     cinacalcet (SENSIPAR) 30 MG tablet TAKE 1 TABLET EVERY DAY 90 tablet 1   cyanocobalamin  (VITAMIN B12) 1000 MCG tablet Take 1 tablet (1,000 mcg total) by mouth 2 (two) times a week. Tuesday and thursday 90 tablet 3   latanoprost (XALATAN) 0.005 % ophthalmic solution INSTILL 1 DROP INTO BOTH EYES AT BEDTIME 7.5 mL 3   losartan (COZAAR) 25 MG tablet TAKE 1 TABLET (25 MG TOTAL) BY MOUTH DAILY. 90 tablet 3   magnesium oxide (MAG-OX) 400 (240 Mg) MG tablet TAKE 1 TABLET EVERY DAY 90 tablet 0   metoprolol tartrate (LOPRESSOR) 25 MG tablet Take 12.5 mg by mouth 2 (two) times daily.     omeprazole (PRILOSEC) 40 MG capsule TAKE 1 CAPSULE EVERY DAY 90 capsule 3   OXYGEN Inhale 3 mLs into the lungs daily.     pravastatin (PRAVACHOL) 20 MG tablet TAKE 1 TABLET EVERY DAY 90 tablet 3   VITAMIN D PO Take 1,000 Units by mouth at bedtime.     apixaban (ELIQUIS) 5 MG TABS tablet Take 5 mg by mouth 2 (two) times daily. (Patient not taking: Reported on 02/13/2024)     No current facility-administered medications for this visit.     Review of Systems    She denies chest pain, palpitations, pnd, orthopnea, n, v, dizziness, syncope, edema, weight gain, or early satiety. All other systems reviewed and are otherwise negative except as noted above.   Physical Exam    VS:  BP 138/82 (BP Location: Left Arm, Patient Position: Sitting, Cuff Size: Normal)   Pulse 62   Ht 5\' 2"  (1.575 m)   Wt 140 lb 9.6 oz (63.8 kg)   BMI 25.72 kg/m   GEN: Well nourished, well developed, in no acute distress. HEENT: normal. Neck: Supple, no JVD, carotid bruits, or masses. Cardiac: RRR, no murmurs, rubs, or gallops. No clubbing, cyanosis, edema.  Radials/DP/PT 2+ and equal bilaterally.  Right radial groin site without bruising, bleeding, bruit or hematoma.   Respiratory:  Respirations regular and unlabored, clear to auscultation bilaterally. GI: Soft, nontender, nondistended, BS + x 4. MS: no deformity or atrophy. Skin: warm and dry, no rash. Neuro:  Strength and sensation are intact. Psych: Normal affect.  Accessory  Clinical Findings    ECG personally reviewed by me today - EKG Interpretation Date/Time:  Thursday February 13 2024 11:15:49 EDT Ventricular Rate:  62 PR Interval:    QRS Duration:  80 QT Interval:  380 QTC Calculation: 385 R Axis:   254  Text Interpretation: Atrial fibrillation Right superior axis deviation Inferior infarct (cited on or before 31-Oct-2023) Anteroseptal infarct (cited on or before 31-Oct-2023) When compared with ECG of 04-Feb-2024 10:06, Questionable change in initial forces of Septal leads Confirmed by Bernadene Person ([id number]) on 02/13/2024 11:19:52 AM  - no acute changes.   Lab Results  Component Value Date   WBC 5.2 02/05/2024   HGB 12.2 02/05/2024   HCT 36.3 02/05/2024   MCV 91.4 02/05/2024   PLT 118 (L) 02/05/2024   Lab Results  Component Value Date   CREATININE 0.84 02/05/2024   BUN 19 02/05/2024   NA 122 (L) 02/05/2024   K 3.9 02/05/2024   CL 85 (L) 02/05/2024   CO2 29 02/05/2024   Lab Results  Component Value Date   ALT 19 02/03/2024   AST 23 02/03/2024   ALKPHOS 56 02/03/2024   BILITOT 1.1 02/03/2024   Lab Results  Component Value Date   CHOL 112 02/03/2024   HDL 59 02/03/2024   LDLCALC 42 02/03/2024   LDLDIRECT 121.2 06/17/2012   TRIG 53 02/03/2024   CHOLHDL 1.9 02/03/2024    Lab Results  Component Value Date   HGBA1C 4.9 02/03/2024    Assessment & Plan   1. Takotsubo cardiomyopathy:  S/p STEMI, emergent cath on 02/03/2024 revealed no evidence of coronary artery disease. LV gram showed apical inferior and mid to distal inferior hypokinesis with concern for possible Takotsubo cardiomyopathy.  Echocardiogram showed EF 55 to 60%, severe RV enlargement, severe biatrial enlargement, moderate MR, severe tricuspid valve regurgitation.  She denies chest pain, denies worsening dyspnea, edema, PND, orthopnea, weight gain.  Euvolemic and well compensated on exam.  Will check BMET today.  We discussed that if sodium levels have stabilized, she may take Lasix  20 mg daily as needed for swelling, weight gain with plans to repeat BMET in 1 month. She does report increased personal stress and anxiety.  Advised her to discuss this with her PCP.   2. Permanent atrial fibrillation: Rate controlled. No longer on anticoagulation per pt request. She understands the risk of stroke in the absence of anticoagulation.  Continue metoprolol.  3. Valvular heart disease: Most recent echo revealed moderate MR, severe tricuspid valve regurgitation.  Generally asymptomatic.  Consider repeat echocardiogram as clinically indicated.  4. History of PE: No longer on anticoagulation (pt has declined).   5. Hypertension: BP well controlled. Continue current antihypertensive regimen.   6. Hyponatremia: Sodium was 122 at hospital discharge.  Repeat BMET pending as above. If sodium remains decreased, consider referral to nephrology.    7. COPD: On home O2. Denies worsening dyspnea. Follows with pulmonology.   7. Disposition: Follow-up in 3 months with Dr. Bjorn Pippin.       Joylene Grapes, NP 02/13/2024, 1:13 PM

## 2024-02-13 NOTE — Patient Instructions (Addendum)
 Medication Instructions:   No changes  *If you need a refill on your cardiac medications before your next appointment, please call your pharmacy*   Lab Work: BMP If you have labs (blood work) drawn today and your tests are completely normal, you will receive your results only by: MyChart Message (if you have MyChart) OR A paper copy in the mail If you have any lab test that is abnormal or we need to change your treatment, we will call you to review the results.   Testing/Procedures: Not needed   Follow-Up: At [facility], you and your health needs are our priority.  As part of our continuing mission to provide you with exceptional heart care, we have created designated Provider Care Teams.  These Care Teams include your primary Cardiologist (physician) and Advanced Practice Providers (APPs -  Physician Assistants and Nurse Practitioners) who all work together to provide you with the care you need, when you need it.     Your next appointment:   3 month(s)  The format for your next appointment:   In Person  Provider:   Little Ishikawa, MD    Other Instructions   s

## 2024-02-14 LAB — BASIC METABOLIC PANEL WITH GFR
BUN/Creatinine Ratio: 28 (ref 12–28)
BUN: 23 mg/dL (ref 10–36)
CO2: 27 mmol/L (ref 20–29)
Calcium: 10.2 mg/dL (ref 8.7–10.3)
Chloride: 86 mmol/L — ABNORMAL LOW (ref 96–106)
Creatinine, Ser: 0.82 mg/dL (ref 0.57–1.00)
Glucose: 82 mg/dL (ref 70–99)
Potassium: 5 mmol/L (ref 3.5–5.2)
Sodium: 129 mmol/L — ABNORMAL LOW (ref 134–144)
eGFR: 67 mL/min/{1.73_m2} (ref >59)

## 2024-02-17 ENCOUNTER — Telehealth: Payer: Self-pay

## 2024-02-17 ENCOUNTER — Inpatient Hospital Stay (HOSPITAL_COMMUNITY)
Admission: EM | Admit: 2024-02-17 | Discharge: 2024-02-22 | DRG: 291 | Disposition: A | Discharge status: Home / Self Care | Attending: Internal Medicine | Admitting: Internal Medicine

## 2024-02-17 ENCOUNTER — Emergency Department (HOSPITAL_COMMUNITY)

## 2024-02-17 ENCOUNTER — Encounter (HOSPITAL_COMMUNITY): Payer: Self-pay

## 2024-02-17 ENCOUNTER — Other Ambulatory Visit: Payer: Self-pay

## 2024-02-17 DIAGNOSIS — I517 Cardiomegaly: Secondary | ICD-10-CM | POA: Diagnosis not present

## 2024-02-17 DIAGNOSIS — Z7982 Long term (current) use of aspirin: Secondary | ICD-10-CM

## 2024-02-17 DIAGNOSIS — Z66 Do not resuscitate: Secondary | ICD-10-CM | POA: Diagnosis present

## 2024-02-17 DIAGNOSIS — Z87891 Personal history of nicotine dependence: Secondary | ICD-10-CM | POA: Diagnosis not present

## 2024-02-17 DIAGNOSIS — K219 Gastro-esophageal reflux disease without esophagitis: Secondary | ICD-10-CM | POA: Diagnosis present

## 2024-02-17 DIAGNOSIS — E871 Hypo-osmolality and hyponatremia: Secondary | ICD-10-CM | POA: Diagnosis present

## 2024-02-17 DIAGNOSIS — E785 Hyperlipidemia, unspecified: Secondary | ICD-10-CM | POA: Diagnosis not present

## 2024-02-17 DIAGNOSIS — I252 Old myocardial infarction: Secondary | ICD-10-CM | POA: Diagnosis not present

## 2024-02-17 DIAGNOSIS — I10 Essential (primary) hypertension: Secondary | ICD-10-CM | POA: Diagnosis present

## 2024-02-17 DIAGNOSIS — I081 Rheumatic disorders of both mitral and tricuspid valves: Secondary | ICD-10-CM | POA: Diagnosis not present

## 2024-02-17 DIAGNOSIS — I272 Pulmonary hypertension, unspecified: Secondary | ICD-10-CM | POA: Diagnosis not present

## 2024-02-17 DIAGNOSIS — Z9981 Dependence on supplemental oxygen: Secondary | ICD-10-CM | POA: Diagnosis not present

## 2024-02-17 DIAGNOSIS — R069 Unspecified abnormalities of breathing: Secondary | ICD-10-CM | POA: Diagnosis not present

## 2024-02-17 DIAGNOSIS — I700 Atherosclerosis of aorta: Secondary | ICD-10-CM | POA: Diagnosis not present

## 2024-02-17 DIAGNOSIS — I2489 Other forms of acute ischemic heart disease: Secondary | ICD-10-CM | POA: Diagnosis present

## 2024-02-17 DIAGNOSIS — R001 Bradycardia, unspecified: Secondary | ICD-10-CM | POA: Diagnosis present

## 2024-02-17 DIAGNOSIS — I4821 Permanent atrial fibrillation: Secondary | ICD-10-CM | POA: Diagnosis not present

## 2024-02-17 DIAGNOSIS — I110 Hypertensive heart disease with heart failure: Principal | ICD-10-CM | POA: Diagnosis present

## 2024-02-17 DIAGNOSIS — I509 Heart failure, unspecified: Secondary | ICD-10-CM | POA: Diagnosis not present

## 2024-02-17 DIAGNOSIS — Z79899 Other long term (current) drug therapy: Secondary | ICD-10-CM

## 2024-02-17 DIAGNOSIS — J439 Emphysema, unspecified: Secondary | ICD-10-CM | POA: Diagnosis present

## 2024-02-17 DIAGNOSIS — Z885 Allergy status to narcotic agent status: Secondary | ICD-10-CM | POA: Diagnosis not present

## 2024-02-17 DIAGNOSIS — I5033 Acute on chronic diastolic (congestive) heart failure: Secondary | ICD-10-CM | POA: Diagnosis present

## 2024-02-17 DIAGNOSIS — R0602 Shortness of breath: Secondary | ICD-10-CM | POA: Diagnosis present

## 2024-02-17 DIAGNOSIS — Z86711 Personal history of pulmonary embolism: Secondary | ICD-10-CM

## 2024-02-17 DIAGNOSIS — M8580 Other specified disorders of bone density and structure, unspecified site: Secondary | ICD-10-CM | POA: Diagnosis present

## 2024-02-17 DIAGNOSIS — Z604 Social exclusion and rejection: Secondary | ICD-10-CM | POA: Diagnosis present

## 2024-02-17 DIAGNOSIS — Z1152 Encounter for screening for COVID-19: Secondary | ICD-10-CM

## 2024-02-17 DIAGNOSIS — Z90710 Acquired absence of both cervix and uterus: Secondary | ICD-10-CM | POA: Diagnosis not present

## 2024-02-17 DIAGNOSIS — J9611 Chronic respiratory failure with hypoxia: Secondary | ICD-10-CM | POA: Diagnosis present

## 2024-02-17 DIAGNOSIS — I5030 Unspecified diastolic (congestive) heart failure: Secondary | ICD-10-CM | POA: Diagnosis present

## 2024-02-17 DIAGNOSIS — R7989 Other specified abnormal findings of blood chemistry: Secondary | ICD-10-CM | POA: Diagnosis present

## 2024-02-17 DIAGNOSIS — Z7901 Long term (current) use of anticoagulants: Secondary | ICD-10-CM

## 2024-02-17 DIAGNOSIS — J90 Pleural effusion, not elsewhere classified: Secondary | ICD-10-CM | POA: Diagnosis not present

## 2024-02-17 DIAGNOSIS — R0902 Hypoxemia: Secondary | ICD-10-CM | POA: Diagnosis not present

## 2024-02-17 DIAGNOSIS — I4891 Unspecified atrial fibrillation: Secondary | ICD-10-CM | POA: Diagnosis not present

## 2024-02-17 LAB — BASIC METABOLIC PANEL WITH GFR
Anion gap: 10 (ref 5–15)
BUN: 23 mg/dL (ref 8–23)
CO2: 30 mmol/L (ref 22–32)
Calcium: 9.5 mg/dL (ref 8.9–10.3)
Chloride: 81 mmol/L — ABNORMAL LOW (ref 98–111)
Creatinine, Ser: 0.89 mg/dL (ref 0.44–1.00)
GFR, Estimated: >60 mL/min (ref >60)
Glucose, Bld: 110 mg/dL — ABNORMAL HIGH (ref 70–99)
Potassium: 4.5 mmol/L (ref 3.5–5.1)
Sodium: 121 mmol/L — ABNORMAL LOW (ref 135–145)

## 2024-02-17 LAB — CBC
HCT: 38.9 % (ref 36.0–46.0)
Hemoglobin: 12.7 g/dL (ref 12.0–15.0)
MCH: 30.9 pg (ref 26.0–34.0)
MCHC: 32.6 g/dL (ref 30.0–36.0)
MCV: 94.6 fL (ref 80.0–100.0)
Platelets: 166 10*3/uL (ref 150–400)
RBC: 4.11 MIL/uL (ref 3.87–5.11)
RDW: 12.7 % (ref 11.5–15.5)
WBC: 6.8 10*3/uL (ref 4.0–10.5)
nRBC: 0 % (ref 0.0–0.2)

## 2024-02-17 LAB — BRAIN NATRIURETIC PEPTIDE: B Natriuretic Peptide: 1290.1 pg/mL — ABNORMAL HIGH (ref 0.0–100.0)

## 2024-02-17 MED ORDER — FUROSEMIDE 10 MG/ML IJ SOLN
40.0000 mg | Freq: Once | INTRAMUSCULAR | Status: AC
  Administered 2024-02-18: 40 mg via INTRAVENOUS
  Filled 2024-02-17: qty 4

## 2024-02-17 NOTE — Telephone Encounter (Signed)
 Lab results sent via mychart and referral placed for nephrology.

## 2024-02-17 NOTE — ED Provider Notes (Signed)
 [place] EMERGENCY DEPARTMENT AT [facility] Provider Note   CSN: [id number] Arrival date & time: 02/17/24  2130     History  Chief Complaint  Patient presents with   Weight Gain    Annette [name] is a 89 y.o. female.  HPI   89 year old female presents emergency department with complaints of shortness of breath, lower extremity swelling, weight gain at home.  Patient states that she was taken off of her Lasix  upon most recent discharge 02/05/2024.  States over the past week or so, has noticed slight increase swelling of her lower extremities, wearing a 5 pound weight gain and increasing shortness of breath.  States that she does have chronic shortness of breath that has been going on for quite some time and she is on 3 L nasal cannula at baseline.  States over the past week, has felt more short of breath than her normal.  States that she messaged her cardiologist who recommended her beginning 10 mg of Lasix  which she took yesterday and today without significant improvement prompting visit to the emergency department.  Denies any fevers, chills, chest pain, cough.  Past medical history significant for chronic respiratory failure with hypoxia on 3 L nasal cannula at baseline, chronic PE, paroxysmal atrial fibrillation on Eliquis , emphysema, STEMI, CHF, hypertension, hyperparathyroidism, GERD  Home Medications Prior to Admission medications   Medication Sig Start Date End Date Taking? Authorizing Provider  acetaminophen  (TYLENOL ) 500 MG tablet Take 500 mg by mouth in the morning and at bedtime.    [provider]  apixaban  (ELIQUIS ) 5 MG TABS tablet Take 5 mg by mouth 2 (two) times daily. Patient not taking: Reported on 02/13/2024    [provider]  aspirin  EC 81 MG tablet Take 81 mg by mouth daily. Swallow whole.    [provider]  cinacalcet  (SENSIPAR ) 30 MG tablet TAKE 1 TABLET EVERY DAY 04/11/23   Emilie Harden, MD  cyanocobalamin (VITAMIN B12)  1000 MCG tablet Take 1 tablet (1,000 mcg total) by mouth 2 (two) times a week. Tuesday and thursday 05/16/23   [place], [name], MD  latanoprost  (XALATAN ) 0.005 % ophthalmic solution INSTILL 1 DROP INTO BOTH EYES AT BEDTIME 10/29/17   Hillery Lown, MD  losartan  (COZAAR ) 25 MG tablet TAKE 1 TABLET (25 MG TOTAL) BY MOUTH DAILY. 07/16/23   [place], [name], MD  magnesium  oxide (MAG-OX) 400 (240 Mg) MG tablet TAKE 1 TABLET EVERY DAY 09/06/23   Wendie Hamburg, MD  metoprolol  tartrate (LOPRESSOR ) 25 MG tablet Take 12.5 mg by mouth 2 (two) times daily.    [provider]  omeprazole  (PRILOSEC) 40 MG capsule TAKE 1 CAPSULE EVERY DAY 11/20/23   [place], [name], MD  OXYGEN Inhale 3 mLs into the lungs daily.    [provider]  pravastatin  (PRAVACHOL ) 20 MG tablet TAKE 1 TABLET EVERY DAY 07/02/23   [place], [name], MD  VITAMIN D  PO Take 1,000 Units by mouth at bedtime.    [provider]      Allergies    Codeine    Review of Systems   Review of Systems  All other systems reviewed and are negative.   Physical Exam Updated Vital Signs BP (!) 158/97 (BP Location: Left Arm)   Pulse (!) 111   Temp 97.6 F (36.4 C) (Oral)   Resp (!) 22   SpO2 94%  Physical Exam Vitals and nursing note reviewed.  Constitutional:      General:  She is not in acute distress.    Appearance: She is well-developed.  HENT:     Head: Normocephalic and atraumatic.  Eyes:     Conjunctiva/sclera: Conjunctivae normal.  Cardiovascular:     Rate and Rhythm: Normal rate and regular rhythm.     Heart sounds: Murmur heard.  Pulmonary:     Effort: Pulmonary effort is normal. No respiratory distress.     Comments: Decreased breath sounds bilateral lower lung. Abdominal:     Palpations: Abdomen is soft.     Tenderness: There is no abdominal tenderness.  Musculoskeletal:        General: No swelling.     Cervical back: Neck supple.     Right lower leg: Edema present.     Left lower leg:  Edema present.     Comments: 1-2+ edema bilateral lower extremities.  Skin:    General: Skin is warm and dry.     Capillary Refill: Capillary refill takes less than 2 seconds.  Neurological:     Mental Status: She is alert.  Psychiatric:        Mood and Affect: Mood normal.     ED Results / Procedures / Treatments   Labs (all labs ordered are listed, but only abnormal results are displayed) Labs Reviewed  BASIC METABOLIC PANEL WITH GFR  CBC  BRAIN NATRIURETIC PEPTIDE  TROPONIN I (HIGH SENSITIVITY)    EKG None  Radiology No results found.  Procedures Procedures    Medications Ordered in ED Medications - No data to display  ED Course/ Medical Decision Making/ A&P                                 Medical Decision Making Risk Prescription drug management. Decision regarding hospitalization.   This patient presents to the ED for concern of shortness of breath, this involves an extensive number of treatment options, and is a complaint that carries with it a high risk of complications and morbidity.  The differential diagnosis includes ACS, PE, CHF, viral URI, pneumonia, anemia, other   Co morbidities that complicate the patient evaluation  See HPI   Additional history obtained:  Additional history obtained from EMR External records from outside source obtained and reviewed including all the records.  Patient had echocardiogram performed on 02/04/2024 which showed LVEF 55 to 60%, severe bilateral atrial enlargement, moderate mitral regurg, severe tricuspid regurgitation,    Lab Tests:  I Ordered, and personally interpreted labs.  The pertinent results include: No leukocytosis.  No evidence of anemia.  Place available range.  BNP elevated at 1290.   Imaging Studies ordered:  I ordered imaging studies including chest x-ray I independently visualized and interpreted imaging which showed right-sided pleural effusion.  Difficult to assess due to malrotation I  agree with the radiologist interpretation   Cardiac Monitoring: / EKG:  The patient was maintained on a cardiac monitor.  I personally viewed and interpreted the cardiac monitored which showed an underlying rhythm of: atrial fibrillation with normal rate. Non specific st changes. Artifact.    Consultations Obtained:  See ED course  Problem List / ED Course / Critical interventions / Medication management  CHF exacerbation, hyponatremia I ordered medication including lasix   Reevaluation of the patient after these medicines showed that the patient improved I have reviewed the patients home medicines and have made adjustments as needed   Social Determinants of Health:  Former cigarette use. Denies  illicit drug use   Test / Admission - Considered:  CHF exacerbation, hyponatremia Vitals signs within normal range and stable throughout visit. Laboratory/imaging studies significant for: See above 89 year old female presents emergency department with complaints of shortness of breath, lower extremity swelling, weight gain at home.  Patient states that she was taken off of her Lasix  upon most recent discharge 02/05/2024.  States over the past week or so, has noticed slight increase swelling of her lower extremities, wearing a 5 pound weight gain and increasing shortness of breath.  States that she does have chronic shortness of breath that has been going on for quite some time and she is on 3 L nasal cannula at baseline.  States over the past week, has felt more short of breath than her normal.  States that she messaged her cardiologist who recommended her beginning 10 mg of Lasix  which she took yesterday and today without significant improvement prompting visit to the emergency department.  Denies any fevers, chills, chest pain, cough. On exam, bilateral lower extremity edema.  Decreased lung sounds bilateral lung fields with some possible Rales.  Laboratory studies concerning for elevated BNP  of 1290 with sodium of 121, normal renal function.  Chest x-ray did show pleural effusion with difficult assessment secondary to potation patient.  Given patient's age, failure of oral diuresis at home, volume overload status with hyponatremia, will admit to hospital for IV diuresis and continued monitoring.   Treatment plan were discussed at length with patient and they knowledge understanding was agreeable to said plan.  Appropriate consultations were made as described in the ED course.  Patient was stable upon admission to the hospital.         Final Clinical Impression(s) / ED Diagnoses Final diagnoses:  None    Rx / DC Orders ED Discharge Orders     None         [place] Butter, [place] 02/18/24 1610    Guadalupe Lee, MD 03/04/24 1134

## 2024-02-17 NOTE — ED Notes (Signed)
 Annette [name] pt daughter in law called and said she would like Dr. Regino Schultze (Cardiology) [phone] to give her a call.

## 2024-02-17 NOTE — ED Triage Notes (Addendum)
 Pt to ED BIB EMS from home with complaint of weight gain (5lb) after stopping lasix 1 week ago. +Exertional SHOB (normal). Mild edema to BLE.   Instructed to come to ED by cardiology.

## 2024-02-18 DIAGNOSIS — I4821 Permanent atrial fibrillation: Secondary | ICD-10-CM | POA: Diagnosis not present

## 2024-02-18 DIAGNOSIS — M8580 Other specified disorders of bone density and structure, unspecified site: Secondary | ICD-10-CM | POA: Diagnosis present

## 2024-02-18 DIAGNOSIS — Z1152 Encounter for screening for COVID-19: Secondary | ICD-10-CM | POA: Diagnosis not present

## 2024-02-18 DIAGNOSIS — I252 Old myocardial infarction: Secondary | ICD-10-CM | POA: Diagnosis not present

## 2024-02-18 DIAGNOSIS — Z9981 Dependence on supplemental oxygen: Secondary | ICD-10-CM | POA: Diagnosis not present

## 2024-02-18 DIAGNOSIS — R7989 Other specified abnormal findings of blood chemistry: Secondary | ICD-10-CM | POA: Diagnosis present

## 2024-02-18 DIAGNOSIS — E785 Hyperlipidemia, unspecified: Secondary | ICD-10-CM

## 2024-02-18 DIAGNOSIS — Z79899 Other long term (current) drug therapy: Secondary | ICD-10-CM | POA: Diagnosis not present

## 2024-02-18 DIAGNOSIS — Z87891 Personal history of nicotine dependence: Secondary | ICD-10-CM | POA: Diagnosis not present

## 2024-02-18 DIAGNOSIS — K219 Gastro-esophageal reflux disease without esophagitis: Secondary | ICD-10-CM | POA: Diagnosis present

## 2024-02-18 DIAGNOSIS — J9611 Chronic respiratory failure with hypoxia: Secondary | ICD-10-CM | POA: Diagnosis present

## 2024-02-18 DIAGNOSIS — Z86711 Personal history of pulmonary embolism: Secondary | ICD-10-CM | POA: Diagnosis not present

## 2024-02-18 DIAGNOSIS — I5030 Unspecified diastolic (congestive) heart failure: Secondary | ICD-10-CM | POA: Diagnosis present

## 2024-02-18 DIAGNOSIS — I2489 Other forms of acute ischemic heart disease: Secondary | ICD-10-CM | POA: Diagnosis present

## 2024-02-18 DIAGNOSIS — R0602 Shortness of breath: Secondary | ICD-10-CM | POA: Diagnosis present

## 2024-02-18 DIAGNOSIS — Z66 Do not resuscitate: Secondary | ICD-10-CM | POA: Diagnosis present

## 2024-02-18 DIAGNOSIS — R001 Bradycardia, unspecified: Secondary | ICD-10-CM | POA: Diagnosis present

## 2024-02-18 DIAGNOSIS — I272 Pulmonary hypertension, unspecified: Secondary | ICD-10-CM | POA: Diagnosis present

## 2024-02-18 DIAGNOSIS — I509 Heart failure, unspecified: Secondary | ICD-10-CM | POA: Diagnosis not present

## 2024-02-18 DIAGNOSIS — J439 Emphysema, unspecified: Secondary | ICD-10-CM | POA: Diagnosis present

## 2024-02-18 DIAGNOSIS — I10 Essential (primary) hypertension: Secondary | ICD-10-CM

## 2024-02-18 DIAGNOSIS — Z604 Social exclusion and rejection: Secondary | ICD-10-CM | POA: Diagnosis present

## 2024-02-18 DIAGNOSIS — I5033 Acute on chronic diastolic (congestive) heart failure: Secondary | ICD-10-CM | POA: Diagnosis present

## 2024-02-18 DIAGNOSIS — Z90710 Acquired absence of both cervix and uterus: Secondary | ICD-10-CM | POA: Diagnosis not present

## 2024-02-18 DIAGNOSIS — I110 Hypertensive heart disease with heart failure: Secondary | ICD-10-CM | POA: Diagnosis present

## 2024-02-18 DIAGNOSIS — E871 Hypo-osmolality and hyponatremia: Secondary | ICD-10-CM | POA: Diagnosis present

## 2024-02-18 DIAGNOSIS — I081 Rheumatic disorders of both mitral and tricuspid valves: Secondary | ICD-10-CM | POA: Diagnosis present

## 2024-02-18 DIAGNOSIS — Z885 Allergy status to narcotic agent status: Secondary | ICD-10-CM | POA: Diagnosis not present

## 2024-02-18 LAB — URINALYSIS, COMPLETE (UACMP) WITH MICROSCOPIC
Bilirubin Urine: NEGATIVE
Glucose, UA: NEGATIVE mg/dL
Hgb urine dipstick: NEGATIVE
Ketones, ur: NEGATIVE mg/dL
Leukocytes,Ua: NEGATIVE
Nitrite: NEGATIVE
Protein, ur: NEGATIVE mg/dL
Specific Gravity, Urine: 1.014 (ref 1.005–1.030)
pH: 7 (ref 5.0–8.0)

## 2024-02-18 LAB — COMPREHENSIVE METABOLIC PANEL WITH GFR
ALT: 37 U/L (ref 0–44)
AST: 37 U/L (ref 15–41)
Albumin: 3.8 g/dL (ref 3.5–5.0)
Alkaline Phosphatase: 64 U/L (ref 38–126)
Anion gap: 11 (ref 5–15)
BUN: 20 mg/dL (ref 8–23)
CO2: 32 mmol/L (ref 22–32)
Calcium: 9.7 mg/dL (ref 8.9–10.3)
Chloride: 85 mmol/L — ABNORMAL LOW (ref 98–111)
Creatinine, Ser: 0.84 mg/dL (ref 0.44–1.00)
GFR, Estimated: >60 mL/min (ref >60)
Glucose, Bld: 91 mg/dL (ref 70–99)
Potassium: 3.8 mmol/L (ref 3.5–5.1)
Sodium: 128 mmol/L — ABNORMAL LOW (ref 135–145)
Total Bilirubin: 1.2 mg/dL (ref 0.0–1.2)
Total Protein: 6.2 g/dL — ABNORMAL LOW (ref 6.5–8.1)

## 2024-02-18 LAB — CBC WITH DIFFERENTIAL/PLATELET
Abs Immature Granulocytes: 0 10*3/uL (ref 0.00–0.07)
Basophils Absolute: 0 10*3/uL (ref 0.0–0.1)
Basophils Relative: 0 %
Eosinophils Absolute: 0 10*3/uL (ref 0.0–0.5)
Eosinophils Relative: 0 %
HCT: 41.5 % (ref 36.0–46.0)
Hemoglobin: 13.9 g/dL (ref 12.0–15.0)
Immature Granulocytes: 0 %
Lymphocytes Relative: 14 %
Lymphs Abs: 0.6 10*3/uL — ABNORMAL LOW (ref 0.7–4.0)
MCH: 31.2 pg (ref 26.0–34.0)
MCHC: 33.5 g/dL (ref 30.0–36.0)
MCV: 93 fL (ref 80.0–100.0)
Monocytes Absolute: 0.7 10*3/uL (ref 0.1–1.0)
Monocytes Relative: 16 %
Neutro Abs: 3.2 10*3/uL (ref 1.7–7.7)
Neutrophils Relative %: 70 %
Platelets: 158 10*3/uL (ref 150–400)
RBC: 4.46 MIL/uL (ref 3.87–5.11)
RDW: 12.7 % (ref 11.5–15.5)
WBC: 4.6 10*3/uL (ref 4.0–10.5)
nRBC: 0 % (ref 0.0–0.2)

## 2024-02-18 LAB — PHOSPHORUS: Phosphorus: 3.3 mg/dL (ref 2.5–4.6)

## 2024-02-18 LAB — RESP PANEL BY RT-PCR (RSV, FLU A&B, COVID)  RVPGX2
Influenza A by PCR: NEGATIVE
Influenza B by PCR: NEGATIVE
Resp Syncytial Virus by PCR: NEGATIVE
SARS Coronavirus 2 by RT PCR: NEGATIVE

## 2024-02-18 LAB — MAGNESIUM: Magnesium: 1.6 mg/dL — ABNORMAL LOW (ref 1.7–2.4)

## 2024-02-18 LAB — CREATININE, URINE, RANDOM: Creatinine, Urine: 82 mg/dL

## 2024-02-18 LAB — TROPONIN I (HIGH SENSITIVITY)
Troponin I (High Sensitivity): 82 ng/L — ABNORMAL HIGH (ref <18)
Troponin I (High Sensitivity): 87 ng/L — ABNORMAL HIGH (ref <18)

## 2024-02-18 LAB — TSH: TSH: 7.669 u[IU]/mL — ABNORMAL HIGH (ref 0.350–4.500)

## 2024-02-18 LAB — URIC ACID: Uric Acid, Serum: 3.9 mg/dL (ref 2.5–7.1)

## 2024-02-18 LAB — OSMOLALITY: Osmolality: 270 mosm/kg — ABNORMAL LOW (ref 275–295)

## 2024-02-18 LAB — OSMOLALITY, URINE: Osmolality, Ur: 449 mosm/kg (ref 300–900)

## 2024-02-18 LAB — SODIUM, URINE, RANDOM: Sodium, Ur: 15 mmol/L

## 2024-02-18 LAB — BRAIN NATRIURETIC PEPTIDE: B Natriuretic Peptide: 1323.7 pg/mL — ABNORMAL HIGH (ref 0.0–100.0)

## 2024-02-18 MED ORDER — ASPIRIN 81 MG PO TBEC
81.0000 mg | DELAYED_RELEASE_TABLET | Freq: Every day | ORAL | Status: DC
  Administered 2024-02-18 – 2024-02-22 (×5): 81 mg via ORAL
  Filled 2024-02-18 (×5): qty 1

## 2024-02-18 MED ORDER — FUROSEMIDE 10 MG/ML IJ SOLN
40.0000 mg | Freq: Two times a day (BID) | INTRAMUSCULAR | Status: DC
  Filled 2024-02-18: qty 4

## 2024-02-18 MED ORDER — POTASSIUM CHLORIDE CRYS ER 20 MEQ PO TBCR
40.0000 meq | EXTENDED_RELEASE_TABLET | ORAL | Status: AC
  Administered 2024-02-18: 40 meq via ORAL
  Filled 2024-02-18: qty 2

## 2024-02-18 MED ORDER — ALBUTEROL SULFATE (2.5 MG/3ML) 0.083% IN NEBU: 2.5000 mg | INHALATION_SOLUTION | Freq: Four times a day (QID) | RESPIRATORY_TRACT | Status: DC | PRN

## 2024-02-18 MED ORDER — MAGNESIUM OXIDE -MG SUPPLEMENT 400 (240 MG) MG PO TABS
400.0000 mg | ORAL_TABLET | Freq: Every day | ORAL | Status: DC
  Administered 2024-02-18 – 2024-02-22 (×5): 400 mg via ORAL
  Filled 2024-02-18 (×5): qty 1

## 2024-02-18 MED ORDER — MELATONIN 3 MG PO TABS
3.0000 mg | ORAL_TABLET | Freq: Every evening | ORAL | Status: DC | PRN
  Administered 2024-02-20: 3 mg via ORAL
  Filled 2024-02-18: qty 1

## 2024-02-18 MED ORDER — PRAVASTATIN SODIUM 10 MG PO TABS
20.0000 mg | ORAL_TABLET | Freq: Every day | ORAL | Status: DC
  Administered 2024-02-18: 20 mg via ORAL
  Filled 2024-02-18: qty 2

## 2024-02-18 MED ORDER — ONDANSETRON HCL 4 MG/2ML IJ SOLN: 4.0000 mg | Freq: Four times a day (QID) | INTRAMUSCULAR | Status: DC | PRN

## 2024-02-18 MED ORDER — METOPROLOL TARTRATE 12.5 MG HALF TABLET
12.5000 mg | ORAL_TABLET | Freq: Two times a day (BID) | ORAL | Status: DC
  Administered 2024-02-18 (×2): 12.5 mg via ORAL
  Filled 2024-02-18 (×2): qty 1

## 2024-02-18 MED ORDER — LATANOPROST 0.005 % OP SOLN
1.0000 [drp] | Freq: Every day | OPHTHALMIC | Status: DC
  Administered 2024-02-18 – 2024-02-21 (×4): 1 [drp] via OPHTHALMIC
  Filled 2024-02-18: qty 2.5

## 2024-02-18 MED ORDER — ACETAMINOPHEN 650 MG RE SUPP: 650.0000 mg | Freq: Four times a day (QID) | RECTAL | Status: DC | PRN

## 2024-02-18 MED ORDER — LOSARTAN POTASSIUM 25 MG PO TABS
25.0000 mg | ORAL_TABLET | Freq: Every day | ORAL | Status: DC
  Administered 2024-02-18 – 2024-02-22 (×5): 25 mg via ORAL
  Filled 2024-02-18 (×5): qty 1

## 2024-02-18 MED ORDER — SODIUM CHLORIDE 0.9% FLUSH
3.0000 mL | Freq: Two times a day (BID) | INTRAVENOUS | Status: DC
  Administered 2024-02-18 – 2024-02-22 (×9): 3 mL via INTRAVENOUS

## 2024-02-18 MED ORDER — FUROSEMIDE 10 MG/ML IJ SOLN
40.0000 mg | Freq: Two times a day (BID) | INTRAMUSCULAR | Status: DC
  Administered 2024-02-18 – 2024-02-19 (×2): 40 mg via INTRAVENOUS
  Filled 2024-02-18 (×2): qty 4

## 2024-02-18 MED ORDER — ENOXAPARIN SODIUM 40 MG/0.4ML IJ SOSY
40.0000 mg | PREFILLED_SYRINGE | INTRAMUSCULAR | Status: DC
  Administered 2024-02-18 – 2024-02-21 (×4): 40 mg via SUBCUTANEOUS
  Filled 2024-02-18 (×4): qty 0.4

## 2024-02-18 MED ORDER — ACETAMINOPHEN 325 MG PO TABS
650.0000 mg | ORAL_TABLET | Freq: Four times a day (QID) | ORAL | Status: DC | PRN
  Administered 2024-02-18 – 2024-02-22 (×5): 650 mg via ORAL
  Filled 2024-02-18 (×5): qty 2

## 2024-02-18 MED ORDER — FUROSEMIDE 10 MG/ML IJ SOLN
20.0000 mg | Freq: Two times a day (BID) | INTRAMUSCULAR | Status: DC
Start: 2024-02-18 — End: 2024-02-18

## 2024-02-18 MED ORDER — CINACALCET HCL 30 MG PO TABS
30.0000 mg | ORAL_TABLET | Freq: Every day | ORAL | Status: DC
Start: 2024-02-19 — End: 2024-02-22
  Administered 2024-02-19 – 2024-02-22 (×4): 30 mg via ORAL
  Filled 2024-02-18 (×4): qty 1

## 2024-02-18 NOTE — ED Notes (Signed)
 Called and placed PT on monitor with CCMD.

## 2024-02-18 NOTE — Consult Note (Addendum)
 Cardiology Consultation   Patient ID: Annette [name] MRN: [id number]; DOB: 30-Nov-1929  Admit date: 02/17/2024 Date of Consult: 02/18/2024  PCP:  [place], [name], MD   [facility] Providers Cardiologist:  Little Ishikawa, MD     Patient Profile:   Annette [name] is a 89 y.o. female with a hx of Takotsubo cardiomyopathy, permanent atria fibrillation, mitral valve regurgitation, history of PE, HTN, hyperparathyroidism, COPD on chronic home oxygen who is being seen 02/18/2024 for the evaluation of CHF at the request of Dr. Katrinka Blazing.  History of Present Illness:   Annette [name] is a 89 year old female with above medical history who is followed by Dr. Bjorn Pippin.   Per chart review, patient was previously admitted in 10/2019 in the setting of submassive PE. Underwent echocardiogram on 11/13/19 that showed EF 60-65%, no regional wall motion abnormalities, mild MR. Later, cardiac monitor in 03/2021 showed 100% afib burden with HR ranging from 56-105 BPM, avg HR 71 BPM. Echocardiogram in 04/2021 showed EF 55-60%, no regional wall motion abnormalities, mild LVH,  low normal RV systolic function, mild MR, mild-moderate TR. There was a patent foramen ovale with predominantly left to right shunting. Cardiac monitor in 06/2021 showed 100% afib burden with average HR 75 BPM. Later admitted in 12/2021 in the setting of acute hypoxic respiratory, found to have a small chronic PE on CT chest. She has been maintained on eliquis   Patient was recently admitted from 3/25-3/26/25. She had presented to the ED on 3/24 complaining of chest pain. EKG concerning for inferolateral STEMI. She underwent emergent cardiac catheterization on 02/03/24 that showed no evidence of coronary artery disease. Suspected she had Takotsubo cardiomyopathy. Echocardiogram on 02/04/24 showed EF 55-60%, no regional wall motion abnormalities, mild LVH, mildly reduced RV systolic function, severely elevated PA systolic pressure, moderate MR,  severe TR, moderate-severe pulmonic regurgitation. On comparison, both mitral and tricuspid valve regurgitation had worsened. During that admission, sodium was low to 122. Her diuretic therapies were discontinued. Previously, she had only been on lasix every other day. Recommended repeating BNP early the next week. Patient was discharged on losartan 25 mg daily, metoprolol tartrate 12.5 mg BID, pravastatin 20 mg daily  Patient was seen in clinic on 4/3. At that time, she denied dyspnea, edema, PND, orthopnea, weight gain. She was euvolemic on exam. She was not on anticoagulation at her own request, understanded her stroke risk. Na had improved to 129, she was told she could take lasix as needed. She was also referred to nephrology.   Patient presented to the ED on 4/7 complaining of shortness of breath, weight gain, swelling. Initial vital signs showed HR 111, BP 158/97, oxygen 94% on nasal cannula. Labs in the ED significant for Na 121, K 4.5, creatinine 0.89, WBC 6.8, hemoglobin 12.7, platelets 166. BNP elevated to 1290. HsTn 87>82. CXR showed trace to small right pleural effusion. EKG showed atrial fibrillation with HR 55 BPM.   Patient was given one dose of IV lasix 40 mg. Cardiology consulted   On interview, patient reports that she came to the hospital due to lower extremity swelling and worsening shortness of breath. Symptoms had been worsening since she was discharged off lasix on 3/26. She denies palpitations, chest pain. Since coming to the hospital and receiving lasix, she has noted some improvement in her symptoms. Continues to have some lower extremity swelling.     Past Medical History:  Diagnosis Date   DVT (deep venous thrombosis) (HCC)  GERD 10/03/2007   HYPERTENSION 10/03/2007   OSTEOPENIA 02/03/2008   Primary hyperparathyroidism (HCC) 10/03/2007    Past Surgical History:  Procedure Laterality Date   ABDOMINAL HYSTERECTOMY     LEFT HEART CATH AND CORONARY ANGIOGRAPHY N/A  02/03/2024   Procedure: LEFT HEART CATH AND CORONARY ANGIOGRAPHY;  Surgeon: Yates Decamp, MD;  Location: [facility] INVASIVE [facility];  Service: Cardiovascular;  Laterality: N/A;     Home Medications:  Prior to Admission medications   Medication Sig Start Date End Date Taking? Authorizing Provider  acetaminophen (TYLENOL) 500 MG tablet Take 500 mg by mouth in the morning and at bedtime.   Yes [provider]  aspirin EC 81 MG tablet Take 81 mg by mouth daily. Swallow whole.   Yes [provider]  cinacalcet (SENSIPAR) 30 MG tablet TAKE 1 TABLET EVERY DAY 04/11/23  Yes Carlus Pavlov, MD  cyanocobalamin (VITAMIN B12) 1000 MCG tablet Take 1 tablet (1,000 mcg total) by mouth 2 (two) times a week. Tuesday and thursday 05/16/23  Yes [place], [name], MD  latanoprost (XALATAN) 0.005 % ophthalmic solution INSTILL 1 DROP INTO BOTH EYES AT BEDTIME 10/29/17  Yes Gordy Savers, MD  losartan (COZAAR) 25 MG tablet TAKE 1 TABLET (25 MG TOTAL) BY MOUTH DAILY. 07/16/23  Yes [place], [name], MD  magnesium oxide (MAG-OX) 400 (240 Mg) MG tablet TAKE 1 TABLET EVERY DAY 09/06/23  Yes Little Ishikawa, MD  metoprolol tartrate (LOPRESSOR) 25 MG tablet Take 12.5 mg by mouth 2 (two) times daily.   Yes [provider]  omeprazole (PRILOSEC) 40 MG capsule TAKE 1 CAPSULE EVERY DAY 11/20/23  Yes [place], [name], MD  pravastatin (PRAVACHOL) 20 MG tablet TAKE 1 TABLET EVERY DAY 07/02/23  Yes [place], [name], MD  VITAMIN D PO Take 1,000 Units by mouth at bedtime.   Yes [provider]  OXYGEN Inhale 3 mLs into the lungs daily.    [provider]    Inpatient Medications: Scheduled Meds:  aspirin EC  81 mg Oral Daily   [START ON 02/19/2024] cinacalcet  30 mg Oral Q breakfast   enoxaparin (LOVENOX) injection  40 mg Subcutaneous Q24H   furosemide  40 mg Intravenous BID   latanoprost  1 drop Both Eyes QHS   losartan  25 mg Oral Daily   magnesium oxide  400 mg Oral Daily   metoprolol  tartrate  12.5 mg Oral BID   sodium chloride flush  3 mL Intravenous Q12H   Continuous Infusions:  PRN Meds: acetaminophen **OR** acetaminophen, albuterol, melatonin, ondansetron (ZOFRAN) IV  Allergies:    Allergies  Allergen Reactions   Codeine Nausea And Vomiting    Social History:   Social History   Socioeconomic History   Marital status: Widowed    Spouse name: Not on file   Number of children: Not on file   Years of education: Not on file   Highest education level: Not on file  Occupational History   Occupation: retired  Tobacco Use   Smoking status: Former    Current packs/day: 0.00    Types: Cigarettes    Quit date: 11/12/1978    Years since quitting: 45.2   Smokeless tobacco: Never  Vaping Use   Vaping status: Never Used  Substance and Sexual Activity   Alcohol use: No   Drug use: No   Sexual activity: Not Currently  Other Topics Concern   Not on file  Social History Narrative   Pt states her son  and daughter in law live with her   Social Drivers of Health   Financial Resource Strain: Low Risk  (08/21/2022)   Overall Financial Resource Strain (CARDIA)    Difficulty of Paying Living Expenses: Not hard at all  Food Insecurity: No Food Insecurity (02/18/2024)   Hunger Vital Sign    Worried About Running Out of Food in the Last Year: Never true    Ran Out of Food in the Last Year: Never true  Transportation Needs: No Transportation Needs (02/18/2024)   PRAPARE - Administrator, Civil Service (Medical): No    Lack of Transportation (Non-Medical): No  Physical Activity: Inactive (08/21/2022)   Exercise Vital Sign    Days of Exercise per Week: 0 days    Minutes of Exercise per Session: 0 min  Stress: No Stress Concern Present (08/21/2022)   [employer] of Occupational Health - Occupational Stress Questionnaire    Feeling of Stress : Not at all  Social Connections: Socially Isolated (02/18/2024)   Social Connection and Isolation Panel [NHANES]     Frequency of Communication with Friends and Family: More than three times a week    Frequency of Social Gatherings with Friends and Family: More than three times a week    Attends Religious Services: Never    Database administrator or Organizations: No    Attends Banker Meetings: Never    Marital Status: Widowed  Intimate Partner Violence: Not At Risk (02/18/2024)   Humiliation, Afraid, Rape, and Kick questionnaire    Fear of Current or Ex-Partner: No    Emotionally Abused: No    Physically Abused: No    Sexually Abused: No    Family History:    Family History  Problem Relation Age of Onset   Hypercalcemia Neg Hx      ROS:  Please see the history of present illness.   All other ROS reviewed and negative.     Physical Exam/Data:   Vitals:   02/18/24 1039 02/18/24 1115 02/18/24 1429 02/18/24 1505  BP: 139/82 136/79 109/72 (!) 150/98  Pulse: 68 (!) 29 63 60  Resp:  17 16 15   Temp:   98.1 F (36.7 C) 98 F (36.7 C)  TempSrc:   Oral Oral  SpO2:  (!) 60% 100% 100%  Weight:    64.3 kg  Height:    5' 2.5" (1.588 m)    Intake/Output Summary (Last 24 hours) at 02/18/2024 1650 Last data filed at 02/18/2024 0900 Gross per 24 hour  Intake --  Output 3450 ml  Net -3450 ml      02/18/2024    3:05 PM 02/18/2024    1:04 AM 02/13/2024   11:05 AM  Last 3 Weights  Weight (lbs) 141 lb 12.1 oz 140 lb 10.5 oz 140 lb 9.6 oz  Weight (kg) 64.3 kg 63.8 kg 63.776 kg     Body mass index is 25.51 kg/m.  General:  Elderly Female, sitting upright in the bed in no acute distress.  HEENT: normal Neck: no JVD Vascular: Radial pulses 2+ bilaterally Cardiac:  normal S1, S2; RRR; faint systolic murmur at L sternal boarder  Lungs:  crackles in bilateral lung bases, expiratory wheezing throughout. Normal WOB on [place]  Abd: soft, nontender, no hepatomegaly  Ext: 1+ edema in BLE  Musculoskeletal:  No deformities Skin: warm and dry  Neuro:  no focal abnormalities noted Psych:  Normal  affect   EKG:  The EKG was personally reviewed  and demonstrates:  Atrial fibrillation with HR 55 BPM  Telemetry:  Telemetry was personally reviewed and demonstrates:  Atrial fibrillation with HR in the 50s   Relevant CV Studies:  Cardiac Studies & Procedures   ______________________________________________________________________________________________ CARDIAC CATHETERIZATION  CARDIAC CATHETERIZATION 02/03/2024  Narrative Images from the original result were not included. Left Heart Catheterization 02/03/24: Hemodynamic data: LVEDP 10 mmHg.  There is no pressure gradient across the aortic valve.  Angiographic data: LV: Apical inferior and mid to distal inferior hypokinesis.  No significant MR. Normal coronary arteries, right dominant circulation.  Slow filling noted in all the coronary arteries. LAD: Large D1 which is severely tortuous, large D2, LAD ends before reaching the apex. CX: Large OM1 and moderate to large size OM 2, tortuosity again noted. RCA: Large-caliber vessel nondominant vessel, smooth and normal.    Impression and recommendations: No evidence of coronary artery disease.  Suspect Takotsubo cardiomyopathy.  Findings Coronary Findings Diagnostic  Dominance: Right  No diagnostic findings have been documented. Intervention  No interventions have been documented.     ECHOCARDIOGRAM  ECHOCARDIOGRAM COMPLETE 02/04/2024  Narrative ECHOCARDIOGRAM REPORT    Patient Name:   CHARLENE DETTER Date of Exam: 02/04/2024 Medical Rec #:  [id number]       Height:       62.5 in Accession #:    [id number]      Weight:       143.1 lb Date of Birth:  [birth year]      BSA:          1.668 m Patient Age:    89 years        BP:           147/82 mmHg Patient Gender: F               HR:           65 bpm. Exam Location:  Inpatient  Procedure: 2D Echo, Cardiac Doppler and Color Doppler (Both Spectral and Color Flow Doppler were utilized during procedure).  Indications:     Takotsubo Cardiomyopathy  History:        Patient has prior history of Echocardiogram examinations, most recent 03/30/2023. CHF, Acute MI, Arrythmias:Atrial Fibrillation, Signs/Symptoms:Syncope; Risk Factors:Hypertension and Dyslipidemia.  Sonographer:    Lucendia Herrlich RCS Referring Phys: [id number] Yates Decamp  IMPRESSIONS   1. Left ventricular ejection fraction, by estimation, is 55 to 60%. The left ventricle has normal function. The left ventricle has no regional wall motion abnormalities. There is mild concentric left ventricular hypertrophy. Left ventricular diastolic parameters are indeterminate. 2. Right ventricular systolic function is mildly reduced. The right ventricular size is severely enlarged. There is severely elevated pulmonary artery systolic pressure. The estimated right ventricular systolic pressure is 65.4 mmHg. 3. Left atrial size was severely dilated. 4. Right atrial size was severely dilated. 5. The mitral valve is normal in structure. Moderate mitral valve regurgitation. No evidence of mitral stenosis. 6. Massive, atrial functional, tricuspid regurgitation with vena contracta width 17 mm and IVC diameter greater than 25 mm. Tricuspid valve regurgitation is severe. 7. The aortic valve is tricuspid. There is mild calcification of the aortic valve. Aortic valve regurgitation is trivial. Aortic valve sclerosis is present, with no evidence of aortic valve stenosis. 8. Multiple jets. Pulmonic valve regurgitation is moderate to severe.  Comparison(s): Prior images reviewed side by side. Both mitral and tricuspid valve regurgitation are worse.  FINDINGS Left Ventricle: No ventricular 3D or strain performed. Left ventricular ejection fraction, by  estimation, is 55 to 60%. The left ventricle has normal function. The left ventricle has no regional wall motion abnormalities. Strain was performed and the global longitudinal strain is indeterminate. The left ventricular internal cavity  size was small. There is mild concentric left ventricular hypertrophy. Left ventricular diastolic parameters are indeterminate.  Right Ventricle: The right ventricular size is severely enlarged. No increase in right ventricular wall thickness. Right ventricular systolic function is mildly reduced. There is severely elevated pulmonary artery systolic pressure. The tricuspid regurgitant velocity is 3.55 m/s, and with an assumed right atrial pressure of 15 mmHg, the estimated right ventricular systolic pressure is 65.4 mmHg.  Left Atrium: Left atrial size was severely dilated.  Right Atrium: Right atrial size was severely dilated.  Pericardium: There is no evidence of pericardial effusion.  Mitral Valve: The mitral valve is normal in structure. Moderate mitral valve regurgitation. No evidence of mitral valve stenosis.  Tricuspid Valve: Massive, atrial functional, tricuspid regurgitation with vena contracta width 17 mm and IVC diameter greater than 25 mm. The tricuspid valve is normal in structure. Tricuspid valve regurgitation is severe. No evidence of tricuspid stenosis. The flow in the hepatic veins is reversed during ventricular systole.  Aortic Valve: The aortic valve is tricuspid. There is mild calcification of the aortic valve. Aortic valve regurgitation is trivial. Aortic regurgitation PHT measures 858 msec. Aortic valve sclerosis is present, with no evidence of aortic valve stenosis. Aortic valve peak gradient measures 3.6 mmHg.  Pulmonic Valve: Multiple jets. The pulmonic valve was normal in structure. Pulmonic valve regurgitation is moderate to severe. No evidence of pulmonic stenosis.  Aorta: The aortic root and ascending aorta are structurally normal, with no evidence of dilitation.  IAS/Shunts: No atrial level shunt detected by color flow Doppler.  Additional Comments: 3D was performed not requiring image post processing on an independent workstation and was  indeterminate.   LEFT VENTRICLE PLAX 2D LVIDd:         3.60 cm   Diastology LVIDs:         2.30 cm   LV e' medial:    8.84 cm/s LV PW:         1.20 cm   LV E/e' medial:  12.4 LV IVS:        1.10 cm   LV e' lateral:   10.30 cm/s LVOT diam:     2.00 cm   LV E/e' lateral: 10.7 LV SV:         38 LV SV Index:   23 LVOT Area:     3.14 cm   RIGHT VENTRICLE             IVC RV S prime:     14.60 cm/s  IVC diam: 3.00 cm TAPSE (M-mode): 1.9 cm  LEFT ATRIUM              Index        RIGHT ATRIUM           Index LA diam:        5.70 cm  3.42 cm/m   RA Area:     34.90 cm LA Vol (A2C):   106.0 ml 63.55 ml/m  RA Volume:   143.00 ml 85.73 ml/m LA Vol (A4C):   119.0 ml 71.34 ml/m LA Biplane Vol: 116.0 ml 69.55 ml/m AORTIC VALVE AV Area (Vmax): 2.27 cm AV Vmax:        95.25 cm/s AV Peak Grad:   3.6 mmHg LVOT Vmax:  68.90 cm/s LVOT Vmean:     41.233 cm/s LVOT VTI:       0.122 m AI PHT:         858 msec  AORTA Ao Root diam: 2.90 cm Ao Asc diam:  3.70 cm  MITRAL VALVE                TRICUSPID VALVE MV Area (PHT): 5.02 cm     TR Peak grad:   50.4 mmHg MV Decel Time: 151 msec     TR Vmax:        355.00 cm/s MR Peak grad: 103.4 mmHg MR Vmax:      508.50 cm/s   SHUNTS MV E velocity: 110.00 cm/s  Systemic VTI:  0.12 m MV A velocity: 34.40 cm/s   Systemic Diam: 2.00 cm MV E/A ratio:  3.20  Riley Lam MD Electronically signed by Riley Lam MD Signature Date/Time: 02/04/2024/12:41:45 PM    Final    MONITORS  LONG TERM MONITOR (3-14 DAYS) 06/12/2021  Narrative  100% Afib burden, average rate 75 bpm   Patch Wear Time:  3 days and 13 hours (2022-07-23T21:24:50-0400 to 2022-07-27T10:30:52-0400)  Atrial Fibrillation occurred continuously (100% burden), ranging from 50-138 bpm (avg of 75 bpm). Isolated VEs were rare (<1.0%), VE Couplets were rare (<1.0%), and no VE Triplets were present.        ______________________________________________________________________________________________       Laboratory Data:  High Sensitivity Troponin:   Recent Labs  Lab 02/03/24 1445 02/03/24 1625 02/17/24 2355 02/17/24 2357  TROPONINIHS 20* 250* 87* 82*     Chemistry Recent Labs  Lab 02/13/24 1203 02/17/24 2211 02/18/24 0710  NA 129* 121* 128*  K 5.0 4.5 3.8  CL 86* 81* 85*  CO2 27 30 32  GLUCOSE 82 110* 91  BUN 23 23 20   CREATININE 0.82 0.89 0.84  CALCIUM 10.2 9.5 9.7  MG  --   --  1.6*  GFRNONAA  --  >60 >60  ANIONGAP  --  10 11    Recent Labs  Lab 02/18/24 0710  PROT 6.2*  ALBUMIN 3.8  AST 37  ALT 37  ALKPHOS 64  BILITOT 1.2   Lipids No results for input(s): "CHOL", "TRIG", "HDL", "LABVLDL", "LDLCALC", "CHOLHDL" in the last 168 hours.  Hematology Recent Labs  Lab 02/17/24 2211 02/18/24 0710  WBC 6.8 4.6  RBC 4.11 4.46  HGB 12.7 13.9  HCT 38.9 41.5  MCV 94.6 93.0  MCH 30.9 31.2  MCHC 32.6 33.5  RDW 12.7 12.7  PLT 166 158   Thyroid  Recent Labs  Lab 02/18/24 0050  TSH 7.669*    BNP Recent Labs  Lab 02/17/24 2212 02/18/24 0710  BNP 1,290.1* 1,323.7*    DDimer No results for input(s): "DDIMER" in the last 168 hours.   Radiology/Studies:  DG Chest 2 View Result Date: 02/18/2024 CLINICAL DATA:  wt gain EXAM: CHEST - 2 VIEW COMPARISON:  Chest x-ray 03/30/2023, CT chest 12/19/2021 FINDINGS: Patient is markedly rotated. The heart and mediastinal contours are difficult to evaluate given patient rotation. Atherosclerotic plaque. Pattern of circular densities overlying the left chest likely external to patient. No focal consolidation. No pulmonary edema. Trace to small right pleural effusion. No left pleural effusion. No pneumothorax. No acute osseous abnormality. IMPRESSION: 1. Trace to small right pleural effusion. 2. Limited evaluation on frontal view due to patient rotation. Consider repeat PA chest x-ray. 3.  Aortic Atherosclerosis  (ICD10-I70.0). Electronically Signed   By: Tish Frederickson  M.D.   On: 02/18/2024 00:36     Assessment and Plan:   Acute on chronic diastolic heart failure  Moderate MR Severe TR  - Echocardiogram on 02/04/24 showed 55-60%, no regional wall motion abnormalities, mildly reduced RV systolic function, mildly reduced RV systolic function, moderate MR, severe TR  - Patient's lasix was recently discontinued due to hyponatremia. Since then, she has had progressive lower extremity edema and shortness of breath  - BNP elevated to 1290. CXR showed trace-small right pleural effusion  - Received IV lasix 40 mg around 1 AM today. Has not received lasix since. Continues to have some lower extremity edea - Ordered additional dose of IV lasix 40 mg tonight  - follow Strict I/Os, daily weights. Repeat BMP in the AM to follow kidney function and electrolytes   Acute on Chronic hyponatremia  - Possibly hypervolemic hyponatremia, however on 3/24 LVEDP on cath was 10 mmHg and Na was 127  - Na was 121 yesterday, improved to 128 today after IV diuresis  - Continue diuresis as above and repeat BMP in the AM  - Low threshold for nephrology consult- she has already been referred to nephrology as an outpatient   Elevated Troponin  - hsTn 87>82 - Recent cath from 01/2024 showed no evidence of coronary artery disease  - Suspect demand ischemia from CHF exacerbation  - No indication for ischemic evaluation at this time   Permanent Atrial fibrillation  - HR well controlled  - continue metoprolol tartrate 12.5 mg daily  - Not on [facility]- previously offered eliquis but she declined due to cost. Was not a candidate for coumadin. Understands the risks of being off blood thinners   HTN  - Continue losartan 25 mg daily, metoprolol tartrate 12.5 mg BID   HLD  - Lipid panel from 01/2024 showed LDL 42, HDL 59, triglycerides 53, total cholesterol 112  - With patient's advanced age, excellent LDL, recent cath without evidence of  CAD, OK to stop statin therapy    Risk Assessment/Risk Scores:    New York Heart Association (NYHA) Functional Class NYHA Class II  CHA2DS2-VASc Score = 5  This indicates a 7.2% annual risk of stroke. The patient's score is based upon:   For questions or updates, please contact [facility] Please consult [email or web address] for contact info under    Signed, Jonita Albee, PA-C  02/18/2024 4:50 PM   Patient seen and examined. Agree with assessment and plan.  Annette [name] is a very pleasant 89 year old female who has a history of longstanding permanent atrial fibrillation, remote PE, documented mitral regurgitation, with history of hypertension, hyperparathyroidism, COPD on chronic home oxygen.  She recently underwent catheterization in March 2025 when ECG showed raise concern for inferolateral STEMI.  There was no significant obstructive disease at catheterization.  Recently she has had issues with profound hyponatremia.  Apparently her Lasix had been held and at her last hospitalization she was discharged on losartan 25 mg, Toprol tartrate 12.5 mg twice a day and pravastatin.  She has not been on anticoagulation with her permanent atrial fibrillation per her request.  Recently, she has noticed progressive lower extremity edema with increasing shortness of breath since her hospital discharge and off Lasix therapy.  She re-presented to the hospital yesterday and was found to have sodium at 121, BNP 1290, with elevation of high-sensitivity troponins in the 80s 6 suggestive of demand ischemia most likely contributed by her volume overload.  Noted to have small right pleural effusion.  She was treated with Lasix IV with brisk diuresis.  Today, sodium has increased to 128.  She is breathing better.  I/O since admission is -3450.  Recently, she has supplemental O2.  She appears mildly short of breath.  There is no JVD.  Lungs reveal decreased breath sounds most prominent distally.   Rhythm is irregular irregular with ventricular rate in the 70s.  Is 1/6 systolic murmur.  Abdomen is nontender.  She has bilateral lower extremity edema.  Recent echo Doppler study has shown EF 55 to 60% without regional wall motion abnormalities.  She was felt to have mildly reduced RV systolic function with moderate MR and severe TR.  Plan to continue to diurese.  Recommend nephrology evaluation.  Will follow.  Lennette Bihari, MD, [facility] 02/18/2024 5:48 PM

## 2024-02-18 NOTE — H&P (Addendum)
 History and Physical    Patient: Annette [name] NFA:[id number] DOB: 1930-09-01 DOA: 02/17/2024 DOS: the patient was seen and examined on 02/18/2024 PCP: [place], [name], MD  Patient coming from: Home via EMS  Chief Complaint:  Chief Complaint  Patient presents with   Weight Gain   HPI: Annette [name] is a 89 y.o. female with medical history significant of hypertension, hyperlipidemia, chronic diastolic CHF, permanent atrial fibrillation, COPD with chronic hypoxic respiratory failure on 3 L, DVT/PE, hyperparathyroidism, and GERD presents with swelling and fluid retention.  She had just been hospitalized on 3/24-3/26 after presenting with complaints of chest pain found to have concern for anterior inferior/anterior lateral STEMI.  Patient underwent cardiac cath, but no coronary artery disease was appreciated.  On the procedure patient's family members were noted to be down and she had been advised to stop Lasix post left heart catheterization due to low sodium level with Dr. Jacinto Halim.  She began experiencing swelling in her legs and ankles.  Reports she has had about 5 pound weight gain.  Associated symptoms include shortness of breath more than her normal, cramping in her hands, and tiredness due to the pressure from the fluid.  Denies having any significant fever, stomach pain, nausea, vomiting, blood in her stools, or diarrhea.   She had notified her cardiologist who recommended her start Lasix 10 mg daily which she took yesterday and today without significant movement.  In the ED patient was noted to be afebrile with pulse 54-111, respirations 16-22, and O2 saturations currently maintained on 3 L nasal cannula oxygen.  Labs from 4/7 significant for sodium 121, chloride 81, BNP 1290.1, high-sensitivity troponins 87-> 82.  Chest x-ray was limited due to patient rotation, but revealed trace to small right pleural effusion.  Patient was given Lasix 40 mg IV x 1 dose.  Review of Systems: As mentioned in  the history of present illness. All other systems reviewed and are negative. Past Medical History:  Diagnosis Date   DVT (deep venous thrombosis) (HCC)    GERD 10/03/2007   HYPERTENSION 10/03/2007   OSTEOPENIA 02/03/2008   Primary hyperparathyroidism (HCC) 10/03/2007   Past Surgical History:  Procedure Laterality Date   ABDOMINAL HYSTERECTOMY     LEFT HEART CATH AND CORONARY ANGIOGRAPHY N/A 02/03/2024   Procedure: LEFT HEART CATH AND CORONARY ANGIOGRAPHY;  Surgeon: Yates Decamp, MD;  Location: [facility];  Service: Cardiovascular;  Laterality: N/A;   Social History:  reports that she quit smoking about 45 years ago. Her smoking use included cigarettes. She has never used smokeless tobacco. She reports that she does not drink alcohol and does not use drugs.  Allergies  Allergen Reactions   Codeine Nausea And Vomiting    Family History  Problem Relation Age of Onset   Hypercalcemia Neg Hx     Prior to Admission medications   Medication Sig Start Date End Date Taking? Authorizing Provider  acetaminophen (TYLENOL) 500 MG tablet Take 500 mg by mouth in the morning and at bedtime.   Yes [provider]  aspirin EC 81 MG tablet Take 81 mg by mouth daily. Swallow whole.   Yes [provider]  cinacalcet (SENSIPAR) 30 MG tablet TAKE 1 TABLET EVERY DAY 04/11/23  Yes Carlus Pavlov, MD  cyanocobalamin (VITAMIN B12) 1000 MCG tablet Take 1 tablet (1,000 mcg total) by mouth 2 (two) times a week. Tuesday and thursday 05/16/23  Yes [place], [name], MD  latanoprost (XALATAN) 0.005 % ophthalmic solution INSTILL  1 DROP INTO BOTH EYES AT BEDTIME 10/29/17  Yes Gordy Savers, MD  losartan (COZAAR) 25 MG tablet TAKE 1 TABLET (25 MG TOTAL) BY MOUTH DAILY. 07/16/23  Yes [place], [name], MD  magnesium oxide (MAG-OX) 400 (240 Mg) MG tablet TAKE 1 TABLET EVERY DAY 09/06/23  Yes Little Ishikawa, MD  metoprolol tartrate (LOPRESSOR) 25 MG tablet Take 12.5 mg by mouth 2 (two)  times daily.   Yes [provider]  omeprazole (PRILOSEC) 40 MG capsule TAKE 1 CAPSULE EVERY DAY 11/20/23  Yes [place], [name], MD  pravastatin (PRAVACHOL) 20 MG tablet TAKE 1 TABLET EVERY DAY 07/02/23  Yes [place], [name], MD  VITAMIN D PO Take 1,000 Units by mouth at bedtime.   Yes [provider]  OXYGEN Inhale 3 mLs into the lungs daily.    [provider]    Physical Exam: Vitals:   02/18/24 0104 02/18/24 0104 02/18/24 0306 02/18/24 0530  BP:   (!) 158/93 (!) 150/76  Pulse:   (!) 54 (!) 58  Resp:   16 16  Temp: 98 F (36.7 C)   98.1 F (36.7 C)  TempSrc: Oral   Oral  SpO2:   100% 100%  Weight:  63.8 kg    Height:  5\' 2"  (1.575 m)      Constitutional: Elderly female who appears to be in no acute distress at this time. Eyes: PERRL, lids and conjunctivae normal ENMT: Mucous membranes are moist. Posterior pharynx clear of any exudate or lesions.   Neck: normal, supple  Respiratory: Decreased overall breath sounds without significant wheezes appreciated at this period in time. Cardiovascular: Irregular irregular.  Positive systolic murmur appreciated.  At least +1 pitting lower extremity edema. Abdomen: no tenderness, no masses palpated.  Bowel sounds positive.  Musculoskeletal: no clubbing / cyanosis. No joint deformity upper and lower extremities. Good ROM, no contractures. Normal muscle tone.  Skin: no rashes, lesions, ulcers. No induration Neurologic: CN 2-12 grossly intact.  Strength 5/5 in all 4.  Psychiatric: Normal judgment and insight. Alert and oriented x 3. Normal mood.   Data Reviewed:  EKG revealed atrial fibrillation at 55 bpm with significant background artifact.   reviewed labs, imaging, and pertinent records as documented.  Assessment and Plan:  Diastolic congestive heart failure exacerbation Acute on chronic.  Patient presents with complaints of lower extremity swelling and 5 pound weight gain after being advised to hold home regimen  of Lasix 20 mg daily when discharged from hospital on 3/26.  BNP noted to be elevated up to 1323.7.  Echo from 02/04/2024 noted EF to be 55 to 60% with severe RV enlargement, severe biatrial enlargement, moderate MR, severe tricuspid regurgitation, and indeterminate diastolic parameters.  Patient had been given 40 mg of Lasix IV and diuresed over 3 L. -Admit to a cardiac telemetry bed -Heart failure order set utilized -Strict I&Os -Lasix 20 mg IV twice daily.  Reassess and adjust diuresis as needed in AM. -Continue metoprolol as tolerated with holding parameters -Cardiology consulted,  will follow-up for any further recommendations  Hyponatremia Acute on chronic.  On admission sodium noted to be as low as 121.  Suspect hypervolemic hyponatremia.  Repeat sodium levels after Lasix noted to be 128. -Continue to monitor sodium  Elevated troponin Acute.  Troponin essentially flat 87->82.  Patient just recently underwent cardiac catheterization on 02/04/2024 which noted no evidence of coronary artery disease since his affected Takotsubo cardiomyopathy. -Continue to monitor  Chronic respiratory failure with  hypoxia Patient is chronically on 3 L of nasal cannula oxygen at baseline.  No significant wheezes appreciated at this time. -Continue 3 L nasal cannula oxygen maintain O2 saturations greater than 90%  Permanent atrial fibrillation Patient bradycardic at baseline and appears to be asymptomatic.  Previously prescribed Eliquis 5 mg twice daily but refused due to cost and was not a good candidate for Coumadin. CHA2DS2-VASc score equal to 7. -Continue metoprolol  Essential hypertension Blood pressure is noted to initially be 150/76, 158/97. -Continue home blood pressure regimen of metoprolol and losartan  Hyperlipidemia -Continue pravastatin  DVT prophylaxis: Lovenox  Advance Care Planning:   Code Status: Limited: Do not attempt resuscitation (DNR) -DNR-LIMITED -Do Not Intubate/DNI     Consults: Cardiology  Family Communication:   Severity of Illness: The appropriate patient status for this patient is INPATIENT. Inpatient status is judged to be reasonable and necessary in order to provide the required intensity of service to ensure the patient's safety. The patient's presenting symptoms, physical exam findings, and initial radiographic and laboratory data in the context of their chronic comorbidities is felt to place them at high risk for further clinical deterioration. Furthermore, it is not anticipated that the patient will be medically stable for discharge from the hospital within 2 midnights of admission.   * I certify that at the point of admission it is my clinical judgment that the patient will require inpatient hospital care spanning beyond 2 midnights from the point of admission due to high intensity of service, high risk for further deterioration and high frequency of surveillance required.*  Author: Clydie Braun, MD 02/18/2024 8:43 AM  For on call review [email or web address].

## 2024-02-18 NOTE — Progress Notes (Signed)
 Transition of Care [facility]) - Inpatient Brief Assessment   Patient Details  Name: Annette [name] MRN: [id number] Date of Birth: 13-Feb-1930  Transition of Care [facility]) CM/SW Contact:    Oletta Cohn, RN Phone Number: 02/18/2024, 1:24 PM   Clinical Narrative: Patient presents with complaints of lower extremity swelling and 5 pound weight gain after being advised to hold home regimen of Lasix 20 mg daily when discharged from hospital on 3/26. Possible home with home health services.   Transition of Care Asessment: Insurance and Status: Insurance coverage has been reviewed Patient has primary care physician: Yes Home environment has been reviewed: (P) from home with son and daughter-in-law Prior level of function:: (P) independent Prior/Current Home Services: (P) No current home services Social Drivers of Health Review: (P) SDOH reviewed no interventions necessary Readmission risk has been reviewed: (P) Yes Transition of care needs: (P) transition of care needs identified, TOC will continue to follow

## 2024-02-18 NOTE — Progress Notes (Signed)
  Carryover admission to the Day Admitter.  I discussed this case with the EDP, Sherian Maroon, PA.  Per these discussions:   This is a 89 year old female with history of chronic diastolic heart failure, recent hospitalization for type II NSTEMI, during which hospitalization she underwent left-sided coronary angiography which showed no evidence of obstructive CAD.  She was subsequently discharged on 02/05/2024 and instructed to hold her outpatient Lasix at that time due to her serum sodium trending down over the course of this most recent prior hospitalization.  She notes that she has been compliant with this instruction to hold her Lasix, but presents to the ED this evening complaining of 1 week of progressive shortness of breath since with worsening edema in the bilateral lower extremities.  BNP is elevated to nearly 1300.  Sodium level is now 121 compared to 122 on the day of discharge from the hospital (3/26).  Potassium is 4.5.  Chest x-ray is reported to show evidence of pulmonary edema.   She received Lasix 40 mg IV x 1 dose in the emergency department this evening.  I have placed an order for observation to PCU for further evaluation and management of acute on chronic diastolic heart failure as well as acute on chronic hyponatremia, felt at this time to possess a hypervolemic element to the latter.  I have placed some additional preliminary admit orders via the adult multi-morbid admission order set. I have also ordered Lasix 40 mg IV twice daily, with next dose to occur in the morning.  For her acute on chronic hyponatremia ordered urinalysis, random urine sodium, random urine creatinine, random urine osmolality, serum osmolality, TSH, and uric acid level.  Plus ordered BNP with morning labs that also include CMP, CBC, magnesium and phosphorus levels.  Also ordered a repeat BMP to occur around 9 AM on the morning of 02/18/2024 for further trending of serum sodium level.  Of note, I have set  the patient's code status as DNR/DNI in the setting of an active DNR form on file as well as based upon the code status documentation from her most recent prior hospitalizations.    Newton Pigg, DO Hospitalist

## 2024-02-19 ENCOUNTER — Telehealth (HOSPITAL_COMMUNITY): Payer: Self-pay | Admitting: Pharmacy Technician

## 2024-02-19 ENCOUNTER — Telehealth: Payer: Self-pay

## 2024-02-19 ENCOUNTER — Other Ambulatory Visit (HOSPITAL_COMMUNITY): Payer: Self-pay

## 2024-02-19 DIAGNOSIS — I509 Heart failure, unspecified: Secondary | ICD-10-CM | POA: Diagnosis not present

## 2024-02-19 DIAGNOSIS — R7989 Other specified abnormal findings of blood chemistry: Secondary | ICD-10-CM | POA: Diagnosis not present

## 2024-02-19 DIAGNOSIS — I5033 Acute on chronic diastolic (congestive) heart failure: Secondary | ICD-10-CM | POA: Diagnosis not present

## 2024-02-19 DIAGNOSIS — E871 Hypo-osmolality and hyponatremia: Secondary | ICD-10-CM | POA: Diagnosis not present

## 2024-02-19 LAB — BASIC METABOLIC PANEL WITH GFR
Anion gap: 7 (ref 5–15)
BUN: 25 mg/dL — ABNORMAL HIGH (ref 8–23)
CO2: 35 mmol/L — ABNORMAL HIGH (ref 22–32)
Calcium: 9.3 mg/dL (ref 8.9–10.3)
Chloride: 87 mmol/L — ABNORMAL LOW (ref 98–111)
Creatinine, Ser: 1.02 mg/dL — ABNORMAL HIGH (ref 0.44–1.00)
GFR, Estimated: 52 mL/min — ABNORMAL LOW (ref >60)
Glucose, Bld: 100 mg/dL — ABNORMAL HIGH (ref 70–99)
Potassium: 4.2 mmol/L (ref 3.5–5.1)
Sodium: 129 mmol/L — ABNORMAL LOW (ref 135–145)

## 2024-02-19 LAB — CBC
HCT: 36.7 % (ref 36.0–46.0)
Hemoglobin: 12.4 g/dL (ref 12.0–15.0)
MCH: 31.5 pg (ref 26.0–34.0)
MCHC: 33.8 g/dL (ref 30.0–36.0)
MCV: 93.1 fL (ref 80.0–100.0)
Platelets: 165 10*3/uL (ref 150–400)
RBC: 3.94 MIL/uL (ref 3.87–5.11)
RDW: 12.9 % (ref 11.5–15.5)
WBC: 5.9 10*3/uL (ref 4.0–10.5)
nRBC: 0 % (ref 0.0–0.2)

## 2024-02-19 MED ORDER — MAGNESIUM SULFATE 4 GM/100ML IV SOLN
4.0000 g | Freq: Once | INTRAVENOUS | Status: AC
  Administered 2024-02-19: 4 g via INTRAVENOUS
  Filled 2024-02-19: qty 100

## 2024-02-19 MED ORDER — SPIRONOLACTONE 12.5 MG HALF TABLET
12.5000 mg | ORAL_TABLET | Freq: Every day | ORAL | Status: DC
  Administered 2024-02-19 – 2024-02-22 (×4): 12.5 mg via ORAL
  Filled 2024-02-19 (×4): qty 1

## 2024-02-19 MED ORDER — PANTOPRAZOLE SODIUM 40 MG PO TBEC
40.0000 mg | DELAYED_RELEASE_TABLET | Freq: Every day | ORAL | Status: DC
  Administered 2024-02-19 – 2024-02-22 (×4): 40 mg via ORAL
  Filled 2024-02-19 (×4): qty 1

## 2024-02-19 MED ORDER — FUROSEMIDE 20 MG PO TABS
20.0000 mg | ORAL_TABLET | ORAL | Status: DC
Start: 2024-02-20 — End: 2024-02-22
  Administered 2024-02-20: 20 mg via ORAL
  Filled 2024-02-19: qty 1

## 2024-02-19 MED ORDER — FUROSEMIDE 10 MG/ML IJ SOLN: 20.0000 mg | Freq: Two times a day (BID) | INTRAMUSCULAR | Status: DC

## 2024-02-19 MED ORDER — PRAVASTATIN SODIUM 10 MG PO TABS
20.0000 mg | ORAL_TABLET | Freq: Every day | ORAL | Status: DC
  Administered 2024-02-19 – 2024-02-21 (×3): 20 mg via ORAL
  Filled 2024-02-19 (×4): qty 2

## 2024-02-19 NOTE — Telephone Encounter (Signed)
 Patient Product/process development scientist completed.    The patient is insured through [employer]. Patient has Medicare and is not eligible for a copay card, but may be able to apply for patient assistance or Medicare RX Payment Plan (Patient Must reach out to their plan, if eligible for payment plan), if available.    Ran test claim for Eliquis 5 mg and the current 30 day co-pay is $251.14 due to a $250.00 deductible.  Will be $47.00 once deductible is met.  Ran test claim for Xarelto 20 mg and the current 30 day co-pay is $251.14 due to a $250.00 deductible.  Will be $47.00 once deductible is met.  This test claim was processed through [facility]- copay amounts may vary at other pharmacies due to pharmacy/plan contracts, or as the patient moves through the different stages of their insurance plan.     Roland Earl, CPHT Pharmacy Technician III Certified Patient Advocate [facility] Patient Advocate Team Direct Number: [phone]  Fax: [phone]

## 2024-02-19 NOTE — Progress Notes (Addendum)
 Patient Name: Annette [name] Date of Encounter: 02/19/2024 [facility] Cardiologist: Little Ishikawa, MD    Interval Summary  .    Patient reports feeling better this AM. Denies chest pain. Breathing is improving. Lower extremity edema resolved   Vital Signs .    Vitals:   02/18/24 2021 02/19/24 0118 02/19/24 0446 02/19/24 0812  BP: 121/69 120/71 115/68 125/78  Pulse: 66 (!) 55 (!) 50 64  Resp: 17 20 14 20   Temp: 98 F (36.7 C) 97.6 F (36.4 C) (!) 97.2 F (36.2 C) (!) 97.5 F (36.4 C)  TempSrc: Oral Oral Oral Oral  SpO2: 100% 100% 100%   Weight:   64.3 kg   Height:        Intake/Output Summary (Last 24 hours) at 02/19/2024 0955 Last data filed at 02/19/2024 0836 Gross per 24 hour  Intake 417 ml  Output 2400 ml  Net -1983 ml      02/19/2024    4:46 AM 02/18/2024    3:05 PM 02/18/2024    1:04 AM  Last 3 Weights  Weight (lbs) 141 lb 12.1 oz 141 lb 12.1 oz 140 lb 10.5 oz  Weight (kg) 64.3 kg 64.3 kg 63.8 kg      Telemetry/ECG    Atrial fibrillation, HR in the 50s  - Personally Reviewed  Physical Exam .   GEN: Elderly female, sitting upright in the armchair. No acute distress    Neck: No JVD Cardiac: irregular rate and rhythm, faint systolic murmur at left sternal boarder  Respiratory: Some end expiratory wheezing, normal WOB on [place]  GI: Soft, nontender, non-distended  MS: No edema in BLE  Assessment & Plan .     Acute on chronic diastolic heart failure  Moderate MR Severe TR  - Echocardiogram on 02/04/24 showed 55-60%, no regional wall motion abnormalities, mildly reduced RV systolic function, mildly reduced RV systolic function, moderate MR, severe TR  - Patient's lasix was recently discontinued due to hyponatremia. Since then, she has had progressive lower extremity edema and shortness of breath  - BNP elevated to 1290. CXR showed trace-small right pleural effusion  - Now on IV lasix 40 mg BID. Output 1.65 L urine yesterday. Creatinine increased  from 0.84 yesterday to 1.02 today  - Appears euvolemic on exam today  - Stop IV lasix  - Start po lasix 20 mg every other day. Will need close monitoring of her Na on diuretics.     Acute on Chronic hyponatremia  - Possibly hypervolemic hyponatremia, however on 3/24 LVEDP on cath was 10 mmHg and Na was 127  - Na was 121 yesterday, improved to 129 today with IV diuretics  - As above, transitioning from IV lasix to PO lasix 20 mg every other day. First oral lasix dose tomorrow  - Low threshold for nephrology consult- she has already been referred to nephrology as an outpatient    Elevated Troponin  - hsTn 87>82 - Recent cath from 01/2024 showed no evidence of coronary artery disease  - Suspect demand ischemia from CHF exacerbation  - No indication for ischemic evaluation at this time    Permanent Atrial fibrillation  - HR low in the 50s - Stop metoprolol tartrate 12.5 mg due to bradycardia  - Not on [facility]- previously offered eliquis but she declined due to cost. Was not a candidate for coumadin. Understands the risks of being off blood thinners. We discussed this again today, continues to decline anticoagulation  HTN  - Continue losartan 25 mg daily    HLD  - Lipid panel from 01/2024 showed LDL 42, HDL 59, triglycerides 53, total cholesterol 112  - Continue pravastatin 20 mg daily. Consider stopping as an outpatient with her advanced age, history of CAD   For questions or updates, please contact [facility] Please consult [email or web address] for contact info under        Signed, Jonita Albee, PA-C    Patient seen and examined. Agree with assessment and plan.  Patient feels significantly better today.  Atrial fibrillation rate is well-controlled in the 50s and 60s.  Peripheral edema has resolved following IV Lasix therapy.  Plan will be to transition to oral therapy tomorrow 20 mg.  Troponin elevation most likely due to demand ischemia in the setting of volume overload.   Sodium today increased to 129.  Watch out for metabolic alkalosis with CO2 increasing to 35.  Cr increased to 1.02 today.  Check laboratory in AM.   Lennette Bihari, MD, [facility] 02/19/2024 11:13 AM

## 2024-02-19 NOTE — Progress Notes (Addendum)
 PROGRESS NOTE    Annette [name]  KGM:[id number] DOB: 09/25/1930 DOA: 02/17/2024 PCP: [place], [name], MD  89/F with COPD/chronic hypoxic respiratory failure on 3 L home O2 chronic diastolic CHF, permanent A-fib, valvular heart disease recently hospitalized with inferior lateral STEMI on 3/24, cath showed no evidence of CAD, suspected to have Takotsubo cardiomyopathy, hospitalization noted severe hyponatremia as well, discharged home off diuretics, presented to the ED 4/7 with worsening dyspnea weight gain and swelling.  In the ED hypertensive, creatinine 0.8, BNP 1290, chest x-ray with right pleural effusion, EKG noted A-fib   Subjective: -Feels much better overall, breathing is improving  Assessment and Plan:  Acute on chronic diastolic CHF Moderate mitral regurgitation, severe tricuspid regurgitation Severe pulmonary hypertension -Echo last month 3/25 noted EF 55-60%, mildly reduced RV, moderate MR severe TR, severely elevated PASP -Recently taken off Lasix for hyponatremia -Diuresed with IV Lasix, volume status is improving, 4.3 L negative yesterday -Transition to oral Lasix later today or tomorrow, continue losartan -Add low-dose Aldactone -Increase activity, PT OT  Hyponatremia -Improving with diuresis, diuretics as above  Permanent atrial fibrillation -Metoprolol discontinued patient declines anticoagulation  Elevated troponin -Secondary to demand ischemia, no ACS  Chronic hypoxic respiratory failure COPD -On 3 L O2 at baseline  Essential hypertension -Stable, continue metoprolol and losartan   DVT prophylaxis: Lovenox Code Status: DNR Family Communication: None present Disposition Plan: Home tomorrow if stable  Consultants:    Procedures:   Antimicrobials:    Objective: Vitals:   02/18/24 2021 02/19/24 0118 02/19/24 0446 02/19/24 0812  BP: 121/69 120/71 115/68 125/78  Pulse: 66 (!) 55 (!) 50 64  Resp: 17 20 14 20   Temp: 98 F (36.7 C) 97.6 F (36.4  C) (!) 97.2 F (36.2 C) (!) 97.5 F (36.4 C)  TempSrc: Oral Oral Oral Oral  SpO2: 100% 100% 100%   Weight:   64.3 kg   Height:        Intake/Output Summary (Last 24 hours) at 02/19/2024 1058 Last data filed at 02/19/2024 0836 Gross per 24 hour  Intake 417 ml  Output 2400 ml  Net -1983 ml   Filed Weights   02/18/24 0104 02/18/24 1505 02/19/24 0446  Weight: 63.8 kg 64.3 kg 64.3 kg    Examination:  General exam: Pleasant elderly female sitting up in bed, AAOx3 HEENT: Positive JVD CVS: S1-S2, irregular rhythm Lungs: Decreased breath sounds at the bases otherwise clear Abdomen: Soft, nontender, bowel sounds present Extremities: Trace edema Psychiatry:  Mood & affect appropriate.     Data Reviewed:   CBC: Recent Labs  Lab 02/17/24 2211 02/18/24 0710 02/19/24 0218  WBC 6.8 4.6 5.9  NEUTROABS  --  3.2  --   HGB 12.7 13.9 12.4  HCT 38.9 41.5 36.7  MCV 94.6 93.0 93.1  PLT 166 158 165   Basic Metabolic Panel: Recent Labs  Lab 02/13/24 1203 02/17/24 2211 02/18/24 0710 02/19/24 0218  NA 129* 121* 128* 129*  K 5.0 4.5 3.8 4.2  CL 86* 81* 85* 87*  CO2 27 30 32 35*  GLUCOSE 82 110* 91 100*  BUN 23 23 20  25*  CREATININE 0.82 0.89 0.84 1.02*  CALCIUM 10.2 9.5 9.7 9.3  MG  --   --  1.6*  --   PHOS  --   --  3.3  --    GFR: Estimated Creatinine Clearance: 32 mL/min (A) (by C-G formula based on SCr of 1.02 mg/dL (H)). Liver Function Tests: Recent Labs  Lab  02/18/24 0710  AST 37  ALT 37  ALKPHOS 64  BILITOT 1.2  PROT 6.2*  ALBUMIN 3.8   No results for input(s): "LIPASE", "AMYLASE" in the last 168 hours. No results for input(s): "AMMONIA" in the last 168 hours. Coagulation Profile: No results for input(s): "INR", "PROTIME" in the last 168 hours. Cardiac Enzymes: No results for input(s): "CKTOTAL", "CKMB", "CKMBINDEX", "TROPONINI" in the last 168 hours. BNP (last 3 results) No results for input(s): "PROBNP" in the last 8760 hours. HbA1C: No results for  input(s): "HGBA1C" in the last 72 hours. CBG: No results for input(s): "GLUCAP" in the last 168 hours. Lipid Profile: No results for input(s): "CHOL", "HDL", "LDLCALC", "TRIG", "CHOLHDL", "LDLDIRECT" in the last 72 hours. Thyroid Function Tests: Recent Labs    02/18/24 0050  TSH 7.669*   Anemia Panel: No results for input(s): "VITAMINB12", "FOLATE", "FERRITIN", "TIBC", "IRON", "RETICCTPCT" in the last 72 hours. Urine analysis:    Component Value Date/Time   COLORURINE YELLOW 02/18/2024 1745   APPEARANCEUR CLEAR 02/18/2024 1745   LABSPEC 1.014 02/18/2024 1745   PHURINE 7.0 02/18/2024 1745   GLUCOSEU NEGATIVE 02/18/2024 1745   HGBUR NEGATIVE 02/18/2024 1745   BILIRUBINUR NEGATIVE 02/18/2024 1745   KETONESUR NEGATIVE 02/18/2024 1745   PROTEINUR NEGATIVE 02/18/2024 1745   NITRITE NEGATIVE 02/18/2024 1745   LEUKOCYTESUR NEGATIVE 02/18/2024 1745   Sepsis Labs: @LABRCNTIP (procalcitonin:4,lacticidven:4)  ) Recent Results (from the past 240 hours)  Resp panel by RT-PCR (RSV, Flu A&B, Covid) Anterior Nasal Swab     Status: None   Collection Time: 02/17/24 10:47 PM   Specimen: Anterior Nasal Swab  Result Value Ref Range Status   SARS Coronavirus 2 by RT PCR NEGATIVE NEGATIVE Final   Influenza A by PCR NEGATIVE NEGATIVE Final   Influenza B by PCR NEGATIVE NEGATIVE Final    Comment: (NOTE) The Xpert Xpress SARS-CoV-2/FLU/RSV plus assay is intended as an aid in the diagnosis of influenza from Nasopharyngeal swab specimens and should not be used as a sole basis for treatment. Nasal washings and aspirates are unacceptable for Xpert Xpress SARS-CoV-2/FLU/RSV testing.  Fact Sheet for Patients: BloggerCourse.com  Fact Sheet for Healthcare Providers: SeriousBroker.it  This test is not yet approved or cleared by the [place] FDA and has been authorized for detection and/or diagnosis of SARS-CoV-2 by FDA under an Emergency Use  Authorization (EUA). This EUA will remain in effect (meaning this test can be used) for the duration of the COVID-19 declaration under Section 564(b)(1) of the Act, 21 U.S.C. section 360bbb-3(b)(1), unless the authorization is terminated or revoked.     Resp Syncytial Virus by PCR NEGATIVE NEGATIVE Final    Comment: (NOTE) Fact Sheet for Patients: BloggerCourse.com  Fact Sheet for Healthcare Providers: SeriousBroker.it  This test is not yet approved or cleared by the [place] FDA and has been authorized for detection and/or diagnosis of SARS-CoV-2 by FDA under an Emergency Use Authorization (EUA). This EUA will remain in effect (meaning this test can be used) for the duration of the COVID-19 declaration under Section 564(b)(1) of the Act, 21 U.S.C. section 360bbb-3(b)(1), unless the authorization is terminated or revoked.  Performed at [facility], [address], [place], [place] [address]      Radiology Studies: DG Chest 2 View Result Date: 02/18/2024 CLINICAL DATA:  wt gain EXAM: CHEST - 2 VIEW COMPARISON:  Chest x-ray 03/30/2023, CT chest 12/19/2021 FINDINGS: Patient is markedly rotated. The heart and mediastinal contours are difficult to evaluate given patient rotation.  Atherosclerotic plaque. Pattern of circular densities overlying the left chest likely external to patient. No focal consolidation. No pulmonary edema. Trace to small right pleural effusion. No left pleural effusion. No pneumothorax. No acute osseous abnormality. IMPRESSION: 1. Trace to small right pleural effusion. 2. Limited evaluation on frontal view due to patient rotation. Consider repeat PA chest x-ray. 3.  Aortic Atherosclerosis (ICD10-I70.0). Electronically Signed   By: Tish Frederickson M.D.   On: 02/18/2024 00:36     Scheduled Meds:  aspirin EC  81 mg Oral Daily   cinacalcet  30 mg Oral Q breakfast   enoxaparin (LOVENOX) injection  40 mg  Subcutaneous Q24H   [START ON 02/20/2024] furosemide  20 mg Oral QODAY   latanoprost  1 drop Both Eyes QHS   losartan  25 mg Oral Daily   magnesium oxide  400 mg Oral Daily   pantoprazole  40 mg Oral Daily   pravastatin  20 mg Oral q1800   sodium chloride flush  3 mL Intravenous Q12H   Continuous Infusions:  magnesium sulfate bolus IVPB 4 g (02/19/24 1043)     LOS: 1 day    Time spent:    Zannie Cove, MD [facility]   02/19/2024, 10:58 AM

## 2024-02-19 NOTE — Evaluation (Signed)
 Physical Therapy Evaluation Patient Details Name: Annette [name] MRN: [id number] DOB: 12-31-1929 Today's Date: 02/19/2024  History of Present Illness  Annette [name] is a 89 y.o. female admitted 02/17/24 for heart failure exacerbation. Pt presented with 5lb weight gain and increased SOB.  Chest x-ray was limited due to patient rotation, but revealed trace to small right pleural effusion. PMH of Takotsubo cardiomyopathy, permanent atria fibrillation, mitral valve regurgitation, PE, HTN, hyperparathyroidism, and COPD on 3L O2 at home. Of note, recent hospitalization  3/24-3/26 for c/o chest pain found to have concern for anterior inferior/anterior lateral STEMI.   Clinical Impression  Pt admitted with above diagnosis. PTA, pt was modI for functional mobility using a rollator and modI with ADLs. She resides with her son and daughter-in-law in a one-level house with 1 STE. She reports strong family support available. Pt currently with functional limitations due to the deficits listed below (see PT Problem List). She required CGA for OOB mobility using a RW, Pt desaturated to 68% SpO2 during ambulation around the bed while on 3L O2. She required bump up to 6L and seated rest for ~74mins to recover SpO2 >88%. RN notified and was able to titrate back to 3L while resting in recliner chair. Pt will benefit from acute skilled PT to increase her independence and safety with mobility to allow discharge. Recommend Home with HHPT to increase strength and improve cardiopulmonary endurance.    If plan is discharge home, recommend the following: A little help with walking and/or transfers;A little help with bathing/dressing/bathroom;Assistance with cooking/housework;Assist for transportation;Help with stairs or ramp for entrance   Can travel by private vehicle        Equipment Recommendations None recommended by PT (Pt already has DME)  Recommendations for Other Services       Functional Status Assessment  Patient has had a recent decline in their functional status and demonstrates the ability to make significant improvements in function in a reasonable and predictable amount of time.     Precautions / Restrictions Precautions Precautions: Fall Recall of Precautions/Restrictions: Intact Precaution/Restrictions Comments: watch SpO2 Restrictions Weight Bearing Restrictions Per Provider Order: No      Mobility  Bed Mobility Overal bed mobility: Needs Assistance Bed Mobility: Supine to Sit     Supine to sit: Supervision, HOB elevated     General bed mobility comments: Pt sat up on the L side of bed without physical assistance or VC for sequencing.    Transfers Overall transfer level: Needs assistance Equipment used: Rolling walker (2 wheels) Transfers: Sit to/from Stand Sit to Stand: Contact guard assist           General transfer comment: Pt stood from lowest bed height. VC for proper hand positioning using RW. Good eccentric control with sitting.    Ambulation/Gait Ambulation/Gait assistance: Contact guard assist Gait Distance (Feet): 15 Feet Assistive device: Rolling walker (2 wheels) Gait Pattern/deviations: Step-through pattern, Decreased stride length, Trunk flexed Gait velocity: reduced Gait velocity interpretation: <1.31 ft/sec, indicative of household ambulator   General Gait Details: Pt ambulated with short slow steps and fwd flex over RW. VC to maintain all four points of the RW in contact with the ground at all times. Pt had difficulty manuevering the tight space and required VC for problem solving. Cued pt on PLB technqiues as she desaturated with mobility.  Stairs            Wheelchair Mobility     Tilt Bed    Modified Rankin (Stroke  Patients Only)       Balance Overall balance assessment: Needs assistance Sitting-balance support: Single extremity supported, Feet supported Sitting balance-Leahy Scale: Fair Sitting balance - Comments: Pt sat  EOB with supervision.   Standing balance support: Bilateral upper extremity supported, During functional activity, Reliant on assistive device for balance Standing balance-Leahy Scale: Poor Standing balance comment: Pt dependent on RW for stability.                             Pertinent Vitals/Pain Pain Assessment Pain Assessment: No/denies pain    Home Living Family/patient expects to be discharged to:: Private residence Living Arrangements: Children;Other relatives (Son and Dauther in [place]) Available Help at Discharge: Family;Available 24 hours/day;Available PRN/intermittently (Son works second shift, but Daughter in [place] is home all the time.) Type of Home: House Home Access: Stairs to enter Entrance Stairs-Rails: None Secretary/administrator of Steps: 1   Home Layout: One level Home Equipment: Rollator (4 wheels);Shower seat;BSC/3in1;Grab bars - tub/shower;Other (comment) (Supplemental Oxygen)      Prior Function Prior Level of Function : Independent/Modified Independent             Mobility Comments: Ambulates with rollator. Denies falls in the last 19mo. ADLs Comments: ModI with ADLs, takes increased time. Sits on a shower chair to bath. Uses BSC at night instead of walking into the bathroom. Relies on family for transportation. Manages her medications, cooks, and cleans. Pt reports having 40+ ft of oxygen line to navigate within her home.     Extremity/Trunk Assessment   Upper Extremity Assessment Upper Extremity Assessment: Generalized weakness;Left hand dominant    Lower Extremity Assessment Lower Extremity Assessment: Generalized weakness    Cervical / Trunk Assessment Cervical / Trunk Assessment: Normal  Communication   Communication Communication: Impaired Factors Affecting Communication: Hearing impaired    Cognition Arousal: Alert Behavior During Therapy: Anxious   PT - Cognitive impairments: No apparent impairments                        PT - Cognition Comments: Pt A,Ox4. She is fearful of falling and requires moderate encouragement to participate. Following commands: Intact       Cueing Cueing Techniques: Verbal cues     General Comments General comments (skin integrity, edema, etc.): Pt greeted on 3L O2, which is her baseline. During gait pt desaturated to 68% SpO2 and required bump up to 6L as well as seated recovery for ~75mins to reach >88%. Following seated rest was able to titrate back to 3L with SpO2 92-96%. RN present and changing lead as pleth reading was unreliable at times.    Exercises     Assessment/Plan    PT Assessment Patient needs continued PT services  PT Problem List Cardiopulmonary status limiting activity;Decreased strength;Decreased activity tolerance;Decreased balance;Decreased mobility       PT Treatment Interventions DME instruction;Gait training;Stair training;Functional mobility training;Therapeutic activities;Therapeutic exercise;Balance training;Neuromuscular re-education;Patient/family education    PT Goals (Current goals can be found in the Care Plan section)  Acute Rehab PT Goals Patient Stated Goal: Return Home PT Goal Formulation: With patient Time For Goal Achievement: 03/04/24 Potential to Achieve Goals: Good    Frequency Min 2X/week     Co-evaluation               AM-PAC PT "6 Clicks" Mobility  Outcome Measure Help needed turning from your back to your side while in a flat bed  without using bedrails?: A Little Help needed moving from lying on your back to sitting on the side of a flat bed without using bedrails?: A Little Help needed moving to and from a bed to a chair (including a wheelchair)?: A Little Help needed standing up from a chair using your arms (e.g., wheelchair or bedside chair)?: A Little Help needed to walk in hospital room?: A Little Help needed climbing 3-5 steps with a railing? : A Lot 6 Click Score: 17    End of Session Equipment  Utilized During Treatment: Gait belt;Oxygen Activity Tolerance: Patient limited by fatigue Patient left: in chair;with call bell/phone within reach;with chair alarm set Nurse Communication: Mobility status;Other (comment) (Desaturation) PT Visit Diagnosis: Muscle weakness (generalized) (M62.81);Unsteadiness on feet (R26.81);Difficulty in walking, not elsewhere classified (R26.2)    Time: 4259-5638 PT Time Calculation (min) (ACUTE ONLY): 30 min   Charges:   PT Evaluation $PT Eval Moderate Complexity: 1 Mod   PT General Charges $$ ACUTE PT VISIT: 1 Visit         Cheri Guppy, PT, DPT Acute Rehabilitation Services Office: [phone] Secure Chat Preferred  Richardson Chiquito 02/19/2024, 9:57 AM

## 2024-02-19 NOTE — TOC Progression Note (Signed)
 Transition of Care [facility]) - Progression Note    Patient Details  Name: Annette [name] MRN: [id number] Date of Birth: [birth year]  [facility] CM/SW Contact  Leone Haven, RN Phone Number: 02/19/2024, 1:38 PM  Clinical Narrative:    NCM spoke with patient at the bedside, offered choice for HHPT, she [facility] since she had them before.  NCM made referral to [facility] with Frances Furbish , he is able to take referral.  Soc will begin 24 to 48 hrs post dc.  She gives this NCM permission to speak with her son [name] if need to.        Expected Discharge Plan and Services                                               Social Determinants of Health (SDOH) Interventions SDOH Screenings   Food Insecurity: No Food Insecurity (02/18/2024)  Housing: Low Risk  (02/18/2024)  Transportation Needs: No Transportation Needs (02/18/2024)  Utilities: Not At Risk (02/18/2024)  Depression (PHQ2-9): Low Risk  (08/08/2023)  Financial Resource Strain: Low Risk  (08/21/2022)  Physical Activity: Inactive (08/21/2022)  Social Connections: Socially Isolated (02/18/2024)  Stress: No Stress Concern Present (08/21/2022)  Tobacco Use: Medium Risk (02/17/2024)    Readmission Risk Interventions     No data to display

## 2024-02-19 NOTE — Telephone Encounter (Signed)
 Patient called to make office/MD aware she is currently in the hospital for CHF and A-fib but that she thinks she should be able to make her upcoming appointment.

## 2024-02-19 NOTE — Plan of Care (Signed)
 Pt continues to display anxiety and calls out needing emotional support

## 2024-02-20 DIAGNOSIS — I5033 Acute on chronic diastolic (congestive) heart failure: Secondary | ICD-10-CM | POA: Diagnosis not present

## 2024-02-20 DIAGNOSIS — E871 Hypo-osmolality and hyponatremia: Secondary | ICD-10-CM | POA: Diagnosis not present

## 2024-02-20 DIAGNOSIS — R7989 Other specified abnormal findings of blood chemistry: Secondary | ICD-10-CM | POA: Diagnosis not present

## 2024-02-20 DIAGNOSIS — I509 Heart failure, unspecified: Secondary | ICD-10-CM | POA: Diagnosis not present

## 2024-02-20 LAB — BASIC METABOLIC PANEL WITH GFR
Anion gap: 8 (ref 5–15)
BUN: 23 mg/dL (ref 8–23)
CO2: 36 mmol/L — ABNORMAL HIGH (ref 22–32)
Calcium: 9.5 mg/dL (ref 8.9–10.3)
Chloride: 86 mmol/L — ABNORMAL LOW (ref 98–111)
Creatinine, Ser: 0.99 mg/dL (ref 0.44–1.00)
GFR, Estimated: 54 mL/min — ABNORMAL LOW (ref >60)
Glucose, Bld: 98 mg/dL (ref 70–99)
Potassium: 3.9 mmol/L (ref 3.5–5.1)
Sodium: 130 mmol/L — ABNORMAL LOW (ref 135–145)

## 2024-02-20 LAB — URINALYSIS, ROUTINE W REFLEX MICROSCOPIC
Bilirubin Urine: NEGATIVE
Glucose, UA: NEGATIVE mg/dL
Hgb urine dipstick: NEGATIVE
Ketones, ur: NEGATIVE mg/dL
Leukocytes,Ua: NEGATIVE
Nitrite: NEGATIVE
Protein, ur: NEGATIVE mg/dL
Specific Gravity, Urine: 1.009 (ref 1.005–1.030)
pH: 6 (ref 5.0–8.0)

## 2024-02-20 NOTE — Progress Notes (Signed)
   [facility] Liaison Note:  Notified by [facility] manager of patient/family request for [facility] Palliative services at home after discharge.   Please call with any hospice or outpatient palliative care related questions.   Thank you for the opportunity to participate in this patient's care.   Glenna Fellows, BSN, RN, OCN [employer] [phone]

## 2024-02-20 NOTE — TOC Progression Note (Addendum)
 Transition of Care [facility]) - Progression Note    Patient Details  Name: Annette [name] MRN: [id number] Date of Birth: 17-Sep-1930  Transition [facility]) CM/SW Contact  Leone Haven, RN Phone Number: 02/20/2024, 10:28 AM  Clinical Narrative:    NCM received notification that DIL would like to speak with this NCM and that she and son would like for patient to have outpatient palliative services.  This NCM spoke with patient at the bedside regarding this and she states she gives this NCM permission to call her DIL and son and that if they want her to have outpatient palliative services then, she is ok with it as well.  She states to try to call DIL around 12 noon her phone is  [phone].  NCM contacted DIL , she states they do not want HH services because it does not benefit her , because when she gets home she does not work with them.  She states they would like [facility] outpatient palliative services.  NCM will cancel HH, and make referral to [facility]. Patient is in agreement with this as well.         Expected Discharge Plan and Services                                               Social Determinants of Health (SDOH) Interventions SDOH Screenings   Food Insecurity: No Food Insecurity (02/18/2024)  Housing: Low Risk  (02/18/2024)  Transportation Needs: No Transportation Needs (02/18/2024)  Utilities: Not At Risk (02/18/2024)  Depression (PHQ2-9): Low Risk  (08/08/2023)  Financial Resource Strain: Low Risk  (08/21/2022)  Physical Activity: Inactive (08/21/2022)  Social Connections: Socially Isolated (02/18/2024)  Stress: No Stress Concern Present (08/21/2022)  Tobacco Use: Medium Risk (02/17/2024)    Readmission Risk Interventions     No data to display

## 2024-02-20 NOTE — Progress Notes (Signed)
 Heart Failure Navigator Progress Note  Assessed for Heart & Vascular TOC clinic readiness.  Patient does not meet criteria due to EF 55-60%, No HF TOC per Dr. Jomarie Longs.   Navigator will sign off at this time.   Rhae Hammock, BSN, Scientist, clinical (histocompatibility and immunogenetics) Only

## 2024-02-20 NOTE — Care Management Important Message (Signed)
 Important Message  Patient Details  Name: NATALLIA STELLMACH MRN: [id number] Date of Birth: [birth year]   Important Message Given:  Yes - Medicare IM     Renie Ora 02/20/2024, 11:14 AM

## 2024-02-20 NOTE — Progress Notes (Signed)
 PROGRESS NOTE    Annette [name]  KZS:[id number] DOB: [birth year] DOA: 02/17/2024 PCP: [place], [name], MD  89/F with COPD/chronic hypoxic respiratory failure on 3 L home O2 chronic diastolic CHF, permanent A-fib, valvular heart disease recently hospitalized with inferior lateral STEMI on 3/24, cath showed no evidence of CAD, suspected to have Takotsubo cardiomyopathy, hospitalization noted severe hyponatremia as well, discharged home off diuretics, presented to the ED 4/7 with worsening dyspnea weight gain and swelling.  In the ED hypertensive, creatinine 0.8, BNP 1290, chest x-ray with right pleural effusion, EKG noted A-fib   Subjective: -Feels much better overall, breathing is improving  Assessment and Plan:  Acute on chronic diastolic CHF Moderate mitral regurgitation, severe tricuspid regurgitation Severe pulmonary hypertension -Echo last month 3/25 noted EF 55-60%, mildly reduced RV, moderate MR severe TR, severely elevated PASP -Recently taken off Lasix for hyponatremia -Diuresed with IV Lasix, volume status has improved, 5.9 L negative, switching to oral Lasix today -Continue losartan and low-dose Aldactone -Increase activity, PT OT  Hyponatremia -Improving with diuresis, diuretics as above  Permanent atrial fibrillation -Metoprolol discontinued for bradycardia, patient declines anticoagulation  Foul-smelling urine, Check UA  Elevated troponin -Secondary to demand ischemia, no ACS  Chronic hypoxic respiratory failure COPD -On 3 L O2 at baseline  Essential hypertension -Stable, continue metoprolol and losartan   DVT prophylaxis: Lovenox Code Status: DNR Family Communication: None present Disposition Plan: Home tomorrow if stable  Consultants:    Procedures:   Antimicrobials:    Objective: Vitals:   02/19/24 2300 02/19/24 2301 02/20/24 0417 02/20/24 0752  BP: 133/68  134/79 130/77  Pulse: 62 (!) 50 65   Resp: 15 13 12    Temp: 97.9 F (36.6 C)  97.9  F (36.6 C) 97.6 F (36.4 C)  TempSrc: Oral  Oral Oral  SpO2: 100% (!) 87% 93%   Weight:   66.7 kg   Height:        Intake/Output Summary (Last 24 hours) at 02/20/2024 1124 Last data filed at 02/20/2024 0745 Gross per 24 hour  Intake 280 ml  Output 1100 ml  Net -820 ml   Filed Weights   02/18/24 1505 02/19/24 0446 02/20/24 0417  Weight: 64.3 kg 64.3 kg 66.7 kg    Examination:  Gen: Awake, Alert, Oriented X 3,  HEENT: no JVD Lungs: Good air movement bilaterally, CTAB CVS: S1S2/irregular rhythm Abd: soft, Non tender, non distended, BS present Extremities: No edema Skin: no new rashes on exposed skin     Data Reviewed:   CBC: Recent Labs  Lab 02/17/24 2211 02/18/24 0710 02/19/24 0218  WBC 6.8 4.6 5.9  NEUTROABS  --  3.2  --   HGB 12.7 13.9 12.4  HCT 38.9 41.5 36.7  MCV 94.6 93.0 93.1  PLT 166 158 165   Basic Metabolic Panel: Recent Labs  Lab 02/13/24 1203 02/17/24 2211 02/18/24 0710 02/19/24 0218 02/20/24 0217  NA 129* 121* 128* 129* 130*  K 5.0 4.5 3.8 4.2 3.9  CL 86* 81* 85* 87* 86*  CO2 27 30 32 35* 36*  GLUCOSE 82 110* 91 100* 98  BUN 23 23 20  25* 23  CREATININE 0.82 0.89 0.84 1.02* 0.99  CALCIUM 10.2 9.5 9.7 9.3 9.5  MG  --   --  1.6*  --   --   PHOS  --   --  3.3  --   --    GFR: Estimated Creatinine Clearance: 33.6 mL/min (by C-G formula based on SCr of 0.99  mg/dL). Liver Function Tests: Recent Labs  Lab 02/18/24 0710  AST 37  ALT 37  ALKPHOS 64  BILITOT 1.2  PROT 6.2*  ALBUMIN 3.8   No results for input(s): "LIPASE", "AMYLASE" in the last 168 hours. No results for input(s): "AMMONIA" in the last 168 hours. Coagulation Profile: No results for input(s): "INR", "PROTIME" in the last 168 hours. Cardiac Enzymes: No results for input(s): "CKTOTAL", "CKMB", "CKMBINDEX", "TROPONINI" in the last 168 hours. BNP (last 3 results) No results for input(s): "PROBNP" in the last 8760 hours. HbA1C: No results for input(s): "HGBA1C" in the  last 72 hours. CBG: No results for input(s): "GLUCAP" in the last 168 hours. Lipid Profile: No results for input(s): "CHOL", "HDL", "LDLCALC", "TRIG", "CHOLHDL", "LDLDIRECT" in the last 72 hours. Thyroid Function Tests: Recent Labs    02/18/24 0050  TSH 7.669*   Anemia Panel: No results for input(s): "VITAMINB12", "FOLATE", "FERRITIN", "TIBC", "IRON", "RETICCTPCT" in the last 72 hours. Urine analysis:    Component Value Date/Time   COLORURINE YELLOW 02/18/2024 1745   APPEARANCEUR CLEAR 02/18/2024 1745   LABSPEC 1.014 02/18/2024 1745   PHURINE 7.0 02/18/2024 1745   GLUCOSEU NEGATIVE 02/18/2024 1745   HGBUR NEGATIVE 02/18/2024 1745   BILIRUBINUR NEGATIVE 02/18/2024 1745   KETONESUR NEGATIVE 02/18/2024 1745   PROTEINUR NEGATIVE 02/18/2024 1745   NITRITE NEGATIVE 02/18/2024 1745   LEUKOCYTESUR NEGATIVE 02/18/2024 1745   Sepsis Labs: @LABRCNTIP (procalcitonin:4,lacticidven:4)  ) Recent Results (from the past 240 hours)  Resp panel by RT-PCR (RSV, Flu A&B, Covid) Anterior Nasal Swab     Status: None   Collection Time: 02/17/24 10:47 PM   Specimen: Anterior Nasal Swab  Result Value Ref Range Status   SARS Coronavirus 2 by RT PCR NEGATIVE NEGATIVE Final   Influenza A by PCR NEGATIVE NEGATIVE Final   Influenza B by PCR NEGATIVE NEGATIVE Final    Comment: (NOTE) The Xpert Xpress SARS-CoV-2/FLU/RSV plus assay is intended as an aid in the diagnosis of influenza from Nasopharyngeal swab specimens and should not be used as a sole basis for treatment. Nasal washings and aspirates are unacceptable for Xpert Xpress SARS-CoV-2/FLU/RSV testing.  Fact Sheet for Patients: BloggerCourse.com  Fact Sheet for Healthcare Providers: SeriousBroker.it  This test is not yet approved or cleared by the [place] FDA and has been authorized for detection and/or diagnosis of SARS-CoV-2 by FDA under an Emergency Use Authorization (EUA). This EUA  will remain in effect (meaning this test can be used) for the duration of the COVID-19 declaration under Section 564(b)(1) of the Act, 21 U.S.C. section 360bbb-3(b)(1), unless the authorization is terminated or revoked.     Resp Syncytial Virus by PCR NEGATIVE NEGATIVE Final    Comment: (NOTE) Fact Sheet for Patients: BloggerCourse.com  Fact Sheet for Healthcare Providers: SeriousBroker.it  This test is not yet approved or cleared by the [place] FDA and has been authorized for detection and/or diagnosis of SARS-CoV-2 by FDA under an Emergency Use Authorization (EUA). This EUA will remain in effect (meaning this test can be used) for the duration of the COVID-19 declaration under Section 564(b)(1) of the Act, 21 U.S.C. section 360bbb-3(b)(1), unless the authorization is terminated or revoked.  Performed at [facility], [address] [place], [place] [address]      Radiology Studies: No results found.    Scheduled Meds:  aspirin EC  81 mg Oral Daily   cinacalcet  30 mg Oral Q breakfast   enoxaparin (LOVENOX) injection  40 mg Subcutaneous  Q24H   furosemide  20 mg Oral QODAY   latanoprost  1 drop Both Eyes QHS   losartan  25 mg Oral Daily   magnesium oxide  400 mg Oral Daily   pantoprazole  40 mg Oral Daily   pravastatin  20 mg Oral q1800   sodium chloride flush  3 mL Intravenous Q12H   spironolactone  12.5 mg Oral Daily   Continuous Infusions:     LOS: 2 days    Time spent:    Zannie Cove, MD [facility]   02/20/2024, 11:24 AM

## 2024-02-20 NOTE — Progress Notes (Addendum)
 Rounding Note    Patient Name: Annette [name] Date of Encounter: 02/20/2024  [facility] Cardiologist: Little Ishikawa, MD   Subjective   Patient is breathing much better today.  Has foul-smelling urine.  Inpatient Medications    Scheduled Meds:  aspirin EC  81 mg Oral Daily   cinacalcet  30 mg Oral Q breakfast   enoxaparin (LOVENOX) injection  40 mg Subcutaneous Q24H   furosemide  20 mg Oral QODAY   latanoprost  1 drop Both Eyes QHS   losartan  25 mg Oral Daily   magnesium oxide  400 mg Oral Daily   pantoprazole  40 mg Oral Daily   pravastatin  20 mg Oral q1800   sodium chloride flush  3 mL Intravenous Q12H   spironolactone  12.5 mg Oral Daily   Continuous Infusions:  PRN Meds: acetaminophen **OR** acetaminophen, albuterol, melatonin, ondansetron (ZOFRAN) IV   Vital Signs    Vitals:   02/19/24 2300 02/19/24 2301 02/20/24 0417 02/20/24 0752  BP: 133/68  134/79 130/77  Pulse: 62 (!) 50 65   Resp: 15 13 12    Temp: 97.9 F (36.6 C)  97.9 F (36.6 C) 97.6 F (36.4 C)  TempSrc: Oral  Oral Oral  SpO2: 100% (!) 87% 93%   Weight:   66.7 kg   Height:        Intake/Output Summary (Last 24 hours) at 02/20/2024 0913 Last data filed at 02/20/2024 0745 Gross per 24 hour  Intake 280 ml  Output 1100 ml  Net -820 ml      02/20/2024    4:17 AM 02/19/2024    4:46 AM 02/18/2024    3:05 PM  Last 3 Weights  Weight (lbs) 147 lb 0.8 oz 141 lb 12.1 oz 141 lb 12.1 oz  Weight (kg) 66.7 kg 64.3 kg 64.3 kg      Telemetry    Atrial fibrillation rate in the 70s to 80s- Personally Reviewed  ECG    Not done yesterday.  Recheck today- Personally Reviewed  Physical Exam    BP 130/77 (BP Location: Right Arm)   Pulse 65   Temp 97.6 F (36.4 C) (Oral)   Resp 12   Ht 5' 2.5" (1.588 m)   Wt 66.7 kg   SpO2 93%   BMI 26.47 kg/m  General: Alert, oriented, no distress.  Skin: normal turgor, no rashes, warm and dry HEENT: Normocephalic, atraumatic.  Nose  without nasal septal hypertrophy Mouth/Parynx benign; Mallinpatti scale 3 Neck: No JVD, no carotid bruits; normal carotid upstroke Lungs: clear to ausculatation and percussion; no wheezing or rales Chest wall: without tenderness to palpitation Heart: PMI not displaced, irregularly irregular with ventricular rate in the 70-80 range, s1 s2 normal, 1/6 systolic murmur, no diastolic murmur, no rubs, gallops, thrills, or heaves Abdomen: soft, nontender; no hepatosplenomehaly, BS+; abdominal aorta nontender and not dilated by palpation. Back: no CVA tenderness Pulses 2+ Musculoskeletal: full range of motion, normal strength, no joint deformities Extremities: no clubbing cyanosis or edema, Homan's sign negative  Neurologic: grossly nonfocal; Cranial nerves grossly wnl Psychologic: Normal mood and affect   Labs    High Sensitivity Troponin:   Recent Labs  Lab 02/03/24 1445 02/03/24 1625 02/17/24 2355 02/17/24 2357  TROPONINIHS 20* 250* 87* 82*     Chemistry Recent Labs  Lab 02/18/24 0710 02/19/24 0218 02/20/24 0217  NA 128* 129* 130*  K 3.8 4.2 3.9  CL 85* 87* 86*  CO2 32 35* 36*  GLUCOSE 91 100* 98  BUN 20 25* 23  CREATININE 0.84 1.02* 0.99  CALCIUM 9.7 9.3 9.5  MG 1.6*  --   --   PROT 6.2*  --   --   ALBUMIN 3.8  --   --   AST 37  --   --   ALT 37  --   --   ALKPHOS 64  --   --   BILITOT 1.2  --   --   GFRNONAA >60 52* 54*  ANIONGAP 11 7 8     Lipids No results for input(s): "CHOL", "TRIG", "HDL", "LABVLDL", "LDLCALC", "CHOLHDL" in the last 168 hours.  Hematology Recent Labs  Lab 02/17/24 2211 02/18/24 0710 02/19/24 0218  WBC 6.8 4.6 5.9  RBC 4.11 4.46 3.94  HGB 12.7 13.9 12.4  HCT 38.9 41.5 36.7  MCV 94.6 93.0 93.1  MCH 30.9 31.2 31.5  MCHC 32.6 33.5 33.8  RDW 12.7 12.7 12.9  PLT 166 158 165   Thyroid  Recent Labs  Lab 02/18/24 0050  TSH 7.669*    BNP Recent Labs  Lab 02/17/24 2212 02/18/24 0710  BNP 1,290.1* 1,323.7*    DDimer No results for  input(s): "DDIMER" in the last 168 hours.   Radiology    No results found.  Cardiac Studies    ECHO: 02/04/2024  1. Left ventricular ejection fraction, by estimation, is 55 to 60%. The  left ventricle has normal function. The left ventricle has no regional  wall motion abnormalities. There is mild concentric left ventricular  hypertrophy. Left ventricular diastolic  parameters are indeterminate.   2. Right ventricular systolic function is mildly reduced. The right  ventricular size is severely enlarged. There is severely elevated  pulmonary artery systolic pressure. The estimated right ventricular  systolic pressure is 65.4 mmHg.   3. Left atrial size was severely dilated.   4. Right atrial size was severely dilated.   5. The mitral valve is normal in structure. Moderate mitral valve  regurgitation. No evidence of mitral stenosis.   6. Massive, atrial functional, tricuspid regurgitation with vena  contracta width 17 mm and IVC diameter greater than 25 mm. Tricuspid valve  regurgitation is severe.   7. The aortic valve is tricuspid. There is mild calcification of the  aortic valve. Aortic valve regurgitation is trivial. Aortic valve  sclerosis is present, with no evidence of aortic valve stenosis.   8. Multiple jets. Pulmonic valve regurgitation is moderate to severe.   Comparison(s): Prior images reviewed side by side. Both mitral and  tricuspid valve regurgitation are worse.   Patient Profile     Annette [name] is a 89 y.o. female with a hx of Takotsubo cardiomyopathy, permanent atria fibrillation, mitral valve regurgitation, history of PE, HTN, hyperparathyroidism, COPD on chronic home oxygen who is being seen 02/18/2024 for the evaluation of CHF at the request of Dr. Katrinka Blazing.   Assessment & Plan    Acute on chronic diastolic heart failure/moderate MR/severe TR: Patient continues to have urine output following Lasix administration of the last several days.  INO's -6253 since  admission.  Echo last admission showed EF 55 to 60% with mild concentric LVH.  She had severe biatrial dilatation, moderate MR and severe TR.  He also had multiple pulmonic valve regurgitant jets. Volume overload: Mentation, patient serum sodium was 121, probably contributed by previous discontinuance of her diuretic regimen.  INR was now -6253 since admission.  She received IV Lasix for 2 days and today will start  Lasix 20 mg orally.  Will make a decision about daily or every other day dosing.  Will recheck BNP today Permanent atrial fibrillation.  Patient's beta-blocker had been held due to bradycardia with heart rates in the low 50s.  Current heart rate now in the 70s to 80s.  Will check ECG today. Mild troponin elevation, consistent with demand ischemia from CHF exacerbation. Hyperlipidemia, currently on pravastatin 20 mg daily. Foul-smelling urine: Check urinalysis and culture per primary MD  Commend patient not be discharged today so that we can see how she responds to oral Lasix and recheck BNP in a.m.     For questions or updates, please contact [facility] Please consult [email or web address] for contact info under        Signed, Nicki Guadalaj[place], MD  02/20/2024, 9:13 AM

## 2024-02-20 NOTE — Plan of Care (Signed)
   Problem: Education: Goal: Knowledge of General Education information will improve Description Including pain rating scale, medication(s)/side effects and non-pharmacologic comfort measures Outcome: Progressing

## 2024-02-21 ENCOUNTER — Other Ambulatory Visit (HOSPITAL_COMMUNITY): Payer: Self-pay

## 2024-02-21 ENCOUNTER — Inpatient Hospital Stay (HOSPITAL_COMMUNITY)

## 2024-02-21 DIAGNOSIS — I4821 Permanent atrial fibrillation: Secondary | ICD-10-CM | POA: Diagnosis not present

## 2024-02-21 DIAGNOSIS — I509 Heart failure, unspecified: Secondary | ICD-10-CM | POA: Diagnosis not present

## 2024-02-21 DIAGNOSIS — I5033 Acute on chronic diastolic (congestive) heart failure: Secondary | ICD-10-CM | POA: Diagnosis not present

## 2024-02-21 DIAGNOSIS — E871 Hypo-osmolality and hyponatremia: Secondary | ICD-10-CM | POA: Diagnosis not present

## 2024-02-21 LAB — BASIC METABOLIC PANEL WITH GFR
Anion gap: 9 (ref 5–15)
Anion gap: 10 (ref 5–15)
BUN: 22 mg/dL (ref 8–23)
BUN: 17 mg/dL (ref 8–23)
CO2: 33 mmol/L — ABNORMAL HIGH (ref 22–32)
CO2: 36 mmol/L — ABNORMAL HIGH (ref 22–32)
Calcium: 9.2 mg/dL (ref 8.9–10.3)
Calcium: 9.3 mg/dL (ref 8.9–10.3)
Chloride: 84 mmol/L — ABNORMAL LOW (ref 98–111)
Chloride: 79 mmol/L — ABNORMAL LOW (ref 98–111)
Creatinine, Ser: 0.92 mg/dL (ref 0.44–1.00)
Creatinine, Ser: 0.83 mg/dL (ref 0.44–1.00)
GFR, Estimated: 59 mL/min — ABNORMAL LOW (ref >60)
GFR, Estimated: >60 mL/min (ref >60)
Glucose, Bld: 107 mg/dL — ABNORMAL HIGH (ref 70–99)
Glucose, Bld: 94 mg/dL (ref 70–99)
Potassium: 4.3 mmol/L (ref 3.5–5.1)
Potassium: 4.6 mmol/L (ref 3.5–5.1)
Sodium: 126 mmol/L — ABNORMAL LOW (ref 135–145)
Sodium: 125 mmol/L — ABNORMAL LOW (ref 135–145)

## 2024-02-21 LAB — BRAIN NATRIURETIC PEPTIDE: B Natriuretic Peptide: 678.6 pg/mL — ABNORMAL HIGH (ref 0.0–100.0)

## 2024-02-21 MED ORDER — FUROSEMIDE 20 MG PO TABS
20.0000 mg | ORAL_TABLET | ORAL | 1 refills | Status: DC
  Filled 2024-02-21: qty 30, 60d supply, fill #0

## 2024-02-21 MED ORDER — FUROSEMIDE 10 MG/ML IJ SOLN
20.0000 mg | Freq: Once | INTRAMUSCULAR | Status: AC
  Administered 2024-02-21: 20 mg via INTRAVENOUS
  Filled 2024-02-21: qty 2

## 2024-02-21 MED ORDER — METOPROLOL TARTRATE 12.5 MG HALF TABLET
12.5000 mg | ORAL_TABLET | Freq: Two times a day (BID) | ORAL | Status: DC
  Administered 2024-02-21 – 2024-02-22 (×3): 12.5 mg via ORAL
  Filled 2024-02-21 (×3): qty 1

## 2024-02-21 MED ORDER — SPIRONOLACTONE 25 MG PO TABS
12.5000 mg | ORAL_TABLET | Freq: Every day | ORAL | 1 refills | Status: DC
  Filled 2024-02-21: qty 30, 60d supply, fill #0

## 2024-02-21 MED ORDER — FUROSEMIDE 20 MG PO TABS
20.0000 mg | ORAL_TABLET | Freq: Every day | ORAL | Status: DC
  Administered 2024-02-22: 20 mg via ORAL
  Filled 2024-02-21: qty 1

## 2024-02-21 NOTE — Plan of Care (Signed)

## 2024-02-21 NOTE — Progress Notes (Addendum)
 PROGRESS NOTE    Annette [name]  ZOX:[id number] DOB: 08/11/1930 DOA: 02/17/2024 PCP: [place], [name], MD  89/F with COPD/chronic hypoxic respiratory failure on 3 L home O2 chronic diastolic CHF, permanent A-fib, valvular heart disease recently hospitalized with inferior lateral STEMI on 3/24, cath showed no evidence of CAD, suspected to have Takotsubo cardiomyopathy, hospitalization noted severe hyponatremia as well, discharged home off diuretics, presented to the ED 4/7 with worsening dyspnea weight gain and swelling.  In the ED hypertensive, creatinine 0.8, BNP 1290, chest x-ray with right pleural effusion, EKG noted A-fib   Subjective: -Feels fair, now no events overnight, weight is going up and sodium trending down  Assessment and Plan:  Acute on chronic diastolic CHF Moderate mitral regurgitation, severe tricuspid regurgitation Severe pulmonary hypertension -Echo last month 3/25 noted EF 55-60%, mildly reduced RV, moderate MR severe TR, severely elevated PASP -Recently taken off Lasix for hyponatremia -Diuresed with IV Lasix, volume status has improved,6 L negative, switched to oral Lasix yesterday, unfortunately sodium is trending down and weight trending up, will await cards input -Continue losartan and low-dose Aldactone -Discharge planning  Hyponatremia -As above, with worsening today, repeat sodium this afternoon  Permanent atrial fibrillation -Metoprolol discontinued for bradycardia, patient declines anticoagulation  Foul-smelling urine, UA was unremarkable  Elevated troponin -Secondary to demand ischemia, no ACS  Chronic hypoxic respiratory failure COPD -On 3 L O2 at baseline  Essential hypertension -Stable, continue metoprolol and losartan   DVT prophylaxis: Lovenox Code Status: DNR Family Communication: None present Disposition Plan: Home tomorrow if stable  Consultants:    Procedures:   Antimicrobials:    Objective: Vitals:   02/21/24 0434  02/21/24 0900 02/21/24 1148 02/21/24 1202  BP: 130/67  136/82 136/82  Pulse:   80 80  Resp: 18  19   Temp: 98.1 F (36.7 C)  97.8 F (36.6 C)   TempSrc: Oral  Oral   SpO2:   100%   Weight: 68.1 kg 68.1 kg    Height:        Intake/Output Summary (Last 24 hours) at 02/21/2024 1213 Last data filed at 02/21/2024 1000 Gross per 24 hour  Intake 513 ml  Output 800 ml  Net -287 ml   Filed Weights   02/20/24 0417 02/21/24 0434 02/21/24 0900  Weight: 66.7 kg 68.1 kg 68.1 kg    Examination:  Gen: Awake, Alert, Oriented X 3,  HEENT: no JVD Lungs: Good air movement bilaterally, CTAB CVS: S1S2/irregular rhythm Abd: soft, Non tender, non distended, BS present Extremities: No edema Skin: no new rashes on exposed skin     Data Reviewed:   CBC: Recent Labs  Lab 02/17/24 2211 02/18/24 0710 02/19/24 0218  WBC 6.8 4.6 5.9  NEUTROABS  --  3.2  --   HGB 12.7 13.9 12.4  HCT 38.9 41.5 36.7  MCV 94.6 93.0 93.1  PLT 166 158 165   Basic Metabolic Panel: Recent Labs  Lab 02/17/24 2211 02/18/24 0710 02/19/24 0218 02/20/24 0217 02/21/24 0256  NA 121* 128* 129* 130* 126*  K 4.5 3.8 4.2 3.9 4.3  CL 81* 85* 87* 86* 84*  CO2 30 32 35* 36* 33*  GLUCOSE 110* 91 100* 98 107*  BUN 23 20 25* 23 22  CREATININE 0.89 0.84 1.02* 0.99 0.92  CALCIUM 9.5 9.7 9.3 9.5 9.2  MG  --  1.6*  --   --   --   PHOS  --  3.3  --   --   --  GFR: Estimated Creatinine Clearance: 36.5 mL/min (by C-G formula based on SCr of 0.92 mg/dL). Liver Function Tests: Recent Labs  Lab 02/18/24 0710  AST 37  ALT 37  ALKPHOS 64  BILITOT 1.2  PROT 6.2*  ALBUMIN 3.8   No results for input(s): "LIPASE", "AMYLASE" in the last 168 hours. No results for input(s): "AMMONIA" in the last 168 hours. Coagulation Profile: No results for input(s): "INR", "PROTIME" in the last 168 hours. Cardiac Enzymes: No results for input(s): "CKTOTAL", "CKMB", "CKMBINDEX", "TROPONINI" in the last 168 hours. BNP (last 3  results) No results for input(s): "PROBNP" in the last 8760 hours. HbA1C: No results for input(s): "HGBA1C" in the last 72 hours. CBG: No results for input(s): "GLUCAP" in the last 168 hours. Lipid Profile: No results for input(s): "CHOL", "HDL", "LDLCALC", "TRIG", "CHOLHDL", "LDLDIRECT" in the last 72 hours. Thyroid Function Tests: No results for input(s): "TSH", "T4TOTAL", "FREET4", "T3FREE", "THYROIDAB" in the last 72 hours.  Anemia Panel: No results for input(s): "VITAMINB12", "FOLATE", "FERRITIN", "TIBC", "IRON", "RETICCTPCT" in the last 72 hours. Urine analysis:    Component Value Date/Time   COLORURINE YELLOW 02/20/2024 1809   APPEARANCEUR CLEAR 02/20/2024 1809   LABSPEC 1.009 02/20/2024 1809   PHURINE 6.0 02/20/2024 1809   GLUCOSEU NEGATIVE 02/20/2024 1809   HGBUR NEGATIVE 02/20/2024 1809   BILIRUBINUR NEGATIVE 02/20/2024 1809   KETONESUR NEGATIVE 02/20/2024 1809   PROTEINUR NEGATIVE 02/20/2024 1809   NITRITE NEGATIVE 02/20/2024 1809   LEUKOCYTESUR NEGATIVE 02/20/2024 1809   Sepsis Labs: @LABRCNTIP (procalcitonin:4,lacticidven:4)  ) Recent Results (from the past 240 hours)  Resp panel by RT-PCR (RSV, Flu A&B, Covid) Anterior Nasal Swab     Status: None   Collection Time: 02/17/24 10:47 PM   Specimen: Anterior Nasal Swab  Result Value Ref Range Status   SARS Coronavirus 2 by RT PCR NEGATIVE NEGATIVE Final   Influenza A by PCR NEGATIVE NEGATIVE Final   Influenza B by PCR NEGATIVE NEGATIVE Final    Comment: (NOTE) The Xpert Xpress SARS-CoV-2/FLU/RSV plus assay is intended as an aid in the diagnosis of influenza from Nasopharyngeal swab specimens and should not be used as a sole basis for treatment. Nasal washings and aspirates are unacceptable for Xpert Xpress SARS-CoV-2/FLU/RSV testing.  Fact Sheet for Patients: BloggerCourse.com  Fact Sheet for Healthcare Providers: SeriousBroker.it  This test is not yet  approved or cleared by the [place] FDA and has been authorized for detection and/or diagnosis of SARS-CoV-2 by FDA under an Emergency Use Authorization (EUA). This EUA will remain in effect (meaning this test can be used) for the duration of the COVID-19 declaration under Section 564(b)(1) of the Act, 21 U.S.C. section 360bbb-3(b)(1), unless the authorization is terminated or revoked.     Resp Syncytial Virus by PCR NEGATIVE NEGATIVE Final    Comment: (NOTE) Fact Sheet for Patients: BloggerCourse.com  Fact Sheet for Healthcare Providers: SeriousBroker.it  This test is not yet approved or cleared by the [place] FDA and has been authorized for detection and/or diagnosis of SARS-CoV-2 by FDA under an Emergency Use Authorization (EUA). This EUA will remain in effect (meaning this test can be used) for the duration of the COVID-19 declaration under Section 564(b)(1) of the Act, 21 U.S.C. section 360bbb-3(b)(1), unless the authorization is terminated or revoked.  Performed at [facility], [address] [place], [place] [address]      Radiology Studies: No results found.    Scheduled Meds:  aspirin EC  81 mg Oral Daily  cinacalcet  30 mg Oral Q breakfast   enoxaparin (LOVENOX) injection  40 mg Subcutaneous Q24H   furosemide  20 mg Oral QODAY   latanoprost  1 drop Both Eyes QHS   losartan  25 mg Oral Daily   magnesium oxide  400 mg Oral Daily   metoprolol tartrate  12.5 mg Oral BID   pantoprazole  40 mg Oral Daily   pravastatin  20 mg Oral q1800   sodium chloride flush  3 mL Intravenous Q12H   spironolactone  12.5 mg Oral Daily   Continuous Infusions:     LOS: 3 days    Time spent:    Zannie Cove, MD [facility]   02/21/2024, 12:13 PM

## 2024-02-21 NOTE — Progress Notes (Signed)
 Mobility Specialist Progress Note:    02/21/24 1202  Therapy Vitals  Pulse Rate 80  BP 136/82  Mobility  Activity Transferred to/from [facility] (to [facility])  Level of Assistance Minimal assist, patient does 75% or more  Assistive Device Other (Comment) (HHA)  Distance Ambulated (ft) 5 ft  Activity Response Tolerated well  Mobility Referral Yes  Mobility visit 1 Mobility  Mobility Specialist Start Time (ACUTE ONLY) 1050  Mobility Specialist Stop Time (ACUTE ONLY) 1100  Mobility Specialist Time Calculation (min) (ACUTE ONLY) 10 min   Pt received in bed hesitant to mobility, w/ mod encouragement pt agreeable to try to use the [facility]. Pt required MinA for bed mobility and STS. Was able tot ake a couple steps towards the [facility] w/ fault. Call bell and personal belongings in reach. Instructed to use it when finished All needs met.  Thompson Grayer Mobility Specialist  Please contact vis Secure Chat or  Rehab Office [phone]

## 2024-02-21 NOTE — Progress Notes (Signed)
 MD patients family requesting call, Rashiya Lofland [phone]

## 2024-02-21 NOTE — Progress Notes (Signed)
 Physical Therapy Treatment Patient Details Name: Annette [name] MRN: [id number] DOB: May 25, 1930 Today's Date: 02/21/2024   History of Present Illness Annette [name] is a 89 y.o. female admitted 02/17/24 for heart failure exacerbation. Pt presented with 5lb weight gain and increased SOB.  Chest x-ray was limited due to patient rotation, but revealed trace to small right pleural effusion. PMH of Takotsubo cardiomyopathy, permanent atria fibrillation, mitral valve regurgitation, PE, HTN, hyperparathyroidism, and COPD on 3L O2 at home. Of note, recent hospitalization  3/24-3/26 for c/o chest pain found to have concern for anterior inferior/anterior lateral STEMI.    PT Comments  Pt greeted supine in bed, pleasant and agreeable to PT session. She requested to use the bathroom, transferred to [facility] without AD d/t urgency with CGA. Pt increased gait distance, ambulating ~80ft using rollator with CGA before a seated rest break. She desaturated to 81% SpO2 during gait. Recovered with bump up to 6L, ~67min seated rest, and cues for PLB. Pleth reading was unreliable at times, RN and MD notified of pt's SpO2 response to activity. Will continue to follow acutely and advance appropriately.      If plan is discharge home, recommend the following: A little help with walking and/or transfers;A little help with bathing/dressing/bathroom;Assistance with cooking/housework;Assist for transportation;Help with stairs or ramp for entrance   Can travel by private vehicle        Equipment Recommendations  None recommended by PT    Recommendations for Other Services       Precautions / Restrictions Precautions Precautions: Fall Recall of Precautions/Restrictions: Intact Precaution/Restrictions Comments: watch SpO2 Restrictions Weight Bearing Restrictions Per Provider Order: No     Mobility  Bed Mobility Overal bed mobility: Needs Assistance Bed Mobility: Supine to Sit, Sit to Supine     Supine to sit: HOB  elevated, Supervision Sit to supine: Supervision   General bed mobility comments: Pt sat up on R side of bed and scooted fwd til feet support. No physical assistance or VC for sequencing.    Transfers Overall transfer level: Needs assistance Equipment used: None, Rollator (4 wheels) Transfers: Sit to/from Stand, Bed to chair/wheelchair/BSC Sit to Stand: Contact guard assist   Step pivot transfers: Contact guard assist       General transfer comment: Pt transfer to/from [facility] without an AD d/t urgency. She stood from bed using proper sequencing for rollator. Pt sat down and got up from rollator seat with good technique and PT managing lines/leads. Good eccentric control with sitting.    Ambulation/Gait Ambulation/Gait assistance: Contact guard assist Gait Distance (Feet): 60 Feet (1x60, seated rest, 1x40) Assistive device: Rollator (4 wheels) Gait Pattern/deviations: Step-through pattern, Decreased stride length, Trunk flexed Gait velocity: reduced Gait velocity interpretation: <1.31 ft/sec, indicative of household ambulator   General Gait Details: Pt ambulated with reciprocal steps inside rollator. She maintains a fwd flex posture throughout. She navigated obstacles in the hallway well. Pt experienced DOE 2/4. Cued PLB technique.   Stairs             Wheelchair Mobility     Tilt Bed    Modified Rankin (Stroke Patients Only)       Balance Overall balance assessment: Needs assistance Sitting-balance support: Single extremity supported, Feet supported Sitting balance-Leahy Scale: Fair     Standing balance support: Single extremity supported, Bilateral upper extremity supported, During functional activity, Reliant on assistive device for balance Standing balance-Leahy Scale: Fair Standing balance comment: Pt transferred without an AD and CGA. She stood with  unilateral UE while PT performed pericare. Pt dependent on rollator for stability during gait.                             Communication Communication Communication: Impaired Factors Affecting Communication: Hearing impaired  Cognition Arousal: Alert Behavior During Therapy: Anxious   PT - Cognitive impairments: No apparent impairments                       PT - Cognition Comments: Pt self-limits mobility distance d/t fear of falling and concern for fatigue. Required moderate encouragement to participate. Following commands: Intact      Cueing Cueing Techniques: Verbal cues  Exercises      General Comments General comments (skin integrity, edema, etc.): Pt greeted on 3L O2. She desaturated to 81% SpO2 during gait. Bumped up to 6L, cued PLB, and had seated rest break to recovery to >88% SpO2. The pleth reading was unreliable at times. Once back in bed SpO2 ranged 80-85% on 4L despite cues for PLB. Increased to 5L and Spo2 was 92% when I left the room. RN notified.      Pertinent Vitals/Pain Pain Assessment Pain Assessment: No/denies pain    Home Living                          Prior Function            PT Goals (current goals can now be found in the care plan section) Acute Rehab PT Goals Patient Stated Goal: Return Home Progress towards PT goals: Progressing toward goals    Frequency    Min 2X/week      PT Plan      Co-evaluation              AM-PAC PT "6 Clicks" Mobility   Outcome Measure  Help needed turning from your back to your side while in a flat bed without using bedrails?: A Little Help needed moving from lying on your back to sitting on the side of a flat bed without using bedrails?: A Little Help needed moving to and from a bed to a chair (including a wheelchair)?: A Little Help needed standing up from a chair using your arms (e.g., wheelchair or bedside chair)?: A Little Help needed to walk in hospital room?: A Little Help needed climbing 3-5 steps with a railing? : A Lot 6 Click Score: 17    End of Session  Equipment Utilized During Treatment: Gait belt;Oxygen Activity Tolerance: Patient limited by fatigue Patient left: in bed;with call bell/phone within reach;with bed alarm set Nurse Communication: Mobility status;Other (comment) (RN and MDs notified of SpO2 readings and desaturation with mobility.) PT Visit Diagnosis: Muscle weakness (generalized) (M62.81);Unsteadiness on feet (R26.81);Difficulty in walking, not elsewhere classified (R26.2)     Time: 1610-9604 PT Time Calculation (min) (ACUTE ONLY): 40 min  Charges:    $Gait Training: 23-37 mins $Therapeutic Activity: 8-22 mins PT General Charges $$ ACUTE PT VISIT: 1 Visit                     Cheri Guppy, PT, DPT Acute Rehabilitation Services Office: [phone] Secure Chat Preferred  Richardson Chiquito 02/21/2024, 3:16 PM

## 2024-02-21 NOTE — Progress Notes (Addendum)
 Patient Name: Annette [name] Date of Encounter: 02/21/2024 [facility] Cardiologist: Little Ishikawa, MD   Interval Summary  .    Patient feels well wanting to go home but still has some expiratory wheezing  Vital Signs .    Vitals:   02/21/24 0037 02/21/24 0038 02/21/24 0434 02/21/24 0900  BP: 136/72  130/67   Pulse:      Resp: 18 16 18    Temp: 98.1 F (36.7 C)  98.1 F (36.7 C)   TempSrc: Oral  Oral   SpO2:      Weight:   68.1 kg 68.1 kg  Height:        Intake/Output Summary (Last 24 hours) at 02/21/2024 1146 Last data filed at 02/21/2024 1000 Gross per 24 hour  Intake 513 ml  Output 800 ml  Net -287 ml      02/21/2024    9:00 AM 02/21/2024    4:34 AM 02/20/2024    4:17 AM  Last 3 Weights  Weight (lbs) 150 lb 2.1 oz 150 lb 2.1 oz 147 lb 0.8 oz  Weight (kg) 68.1 kg 68.1 kg 66.7 kg      Telemetry/ECG    Atrial fibrillation rates controlled - Personally Reviewed  Physical Exam .    GEN: No acute distress.   Neck: No JVD Cardiac: Irreg Irreg, systolic murmur, no rubs, or gallops.  Respiratory: Clear to auscultation bilaterally. GI: Soft, nontender, non-distended  MS: No edema  Assessment & Plan .     89 y.o. female with a hx of Takotsubo cardiomyopathy, permanent atria fibrillation, mitral valve regurgitation, history of PE, HTN, hyperparathyroidism, COPD on chronic home oxygen who is being seen 02/18/2024 for the evaluation of CHF at the request of Dr. Katrinka Blazing.   Acute on Chronic diastolic HF Moderate MR Severe TR -- Echocardiogram on 02/04/24 showed 55-60%, no regional wall motion abnormalities, mildly reduced RV systolic function, mildly reduced RV systolic function, moderate MR, severe TR  -- BNP elevated to 1290. CXR showed trace-small right pleural effusion. S/p IV lasix, repeat BNP this morning 678 -- now on lasix 20mg  every other day, weight are up today. May need to increase to daily dosing  Permanent atrial fibrillation -- BB was  initially held in the setting of bradycardia, rates mostly in the 50-60 range -- will resume metoprolol 12.5mg  BID -- Not on [facility]- previously offered eliquis but she declined due to cost. Was not a candidate for coumadin. Understands the risks of being off blood thinners.   Elevated Troponin  -- hsTn 87>82, recent cath from 01/2024 showed no evidence of coronary artery disease  -- Suspect demand ischemia from CHF exacerbation. No indication for ischemic evaluation at this time  HTN -- controlled, will resume metoprolol 12.5mg  BID as above   Acute on Chronic Hyponatremia  -- initial Na+ 121>>130>>126, improved with diuresis initially but dipped again today  Chronic hypoxic respiratory failure COPD -- on 2L [place] at baseline   Will arrange for outpatient follow up in the office  For questions or updates, please contact [facility] Please consult [email or web address] for contact info under        Signed, Laverda Page, NP    Patient seen and examined. Agree with assessment and plan. I/O -[id number].  Sodium level today has decreased slightly and is now back down to 126.  Her level had increased from 121->130 with IV Lasix.  I suspect her hyponatremia is hypervolemia induced dilution.  Yesterday she received an  oral dose of Lasix 20 mg.  I plan to give Lasix 20 mg IV now.  On exam she does have wheezing and is on supplemental O2.  Although she is wanting to go home today, it may be best for her not to go home today but to keep 1 more day.  I would recommend daily Lasix rather than every other day as an outpatient.  She will need close outpatient follow-up with blood work next week for reassessment of serum electrolytes.   Lennette Bihari, MD, [facility] 02/21/2024 1:58 PM

## 2024-02-21 NOTE — Progress Notes (Signed)
 SATURATION QUALIFICATIONS: (This note is used to comply with regulatory documentation for home oxygen)  Patient Saturations on Room Air at Rest = 97%  Patient Saturations on Room Air while Ambulating = 81%  Patient Saturations on 6 Liters of oxygen while Ambulating = 88%  Please briefly explain why patient needs home oxygen:

## 2024-02-22 ENCOUNTER — Other Ambulatory Visit (HOSPITAL_COMMUNITY): Payer: Self-pay

## 2024-02-22 DIAGNOSIS — I5033 Acute on chronic diastolic (congestive) heart failure: Secondary | ICD-10-CM | POA: Diagnosis not present

## 2024-02-22 LAB — BASIC METABOLIC PANEL WITH GFR
Anion gap: 8 (ref 5–15)
BUN: 19 mg/dL (ref 8–23)
CO2: 36 mmol/L — ABNORMAL HIGH (ref 22–32)
Calcium: 9.2 mg/dL (ref 8.9–10.3)
Chloride: 84 mmol/L — ABNORMAL LOW (ref 98–111)
Creatinine, Ser: 0.87 mg/dL (ref 0.44–1.00)
GFR, Estimated: >60 mL/min (ref >60)
Glucose, Bld: 97 mg/dL (ref 70–99)
Potassium: 3.9 mmol/L (ref 3.5–5.1)
Sodium: 128 mmol/L — ABNORMAL LOW (ref 135–145)

## 2024-02-22 LAB — GLUCOSE, CAPILLARY: Glucose-Capillary: 110 mg/dL — ABNORMAL HIGH (ref 70–99)

## 2024-02-22 MED ORDER — FUROSEMIDE 20 MG PO TABS: 20.0000 mg | ORAL_TABLET | Freq: Every day | ORAL | Status: DC

## 2024-02-22 MED ORDER — SPIRONOLACTONE 25 MG PO TABS
12.5000 mg | ORAL_TABLET | Freq: Every day | ORAL | 1 refills | Status: DC
  Filled 2024-02-22: qty 30, 60d supply, fill #0

## 2024-02-22 NOTE — Discharge Summary (Signed)
 Physician Discharge Summary  Annette [name] OZD:[id number] DOB: [birth year] DOA: 02/17/2024  PCP: [place], [name], [name]  Admit date: 02/17/2024 Discharge date: 02/22/2024  Time spent: 45 minutes  Recommendations for Outpatient Follow-up:  Cardiology Dr. Suzann Ernst in 2 weeks   Discharge Diagnoses:  Principal Problem:   Acute on chronic diastolic heart failure (HCC) Active Problems:   Elevated troponin   Hyponatremia   Chronic respiratory failure with hypoxia (HCC)   Permanent atrial fibrillation (HCC)   Essential hypertension   Hyperlipidemia   Diastolic CHF (HCC)   Acute on chronic congestive heart failure [facility])   Discharge Condition: Improved  Diet recommendation: Low-sodium  Filed Weights   02/21/24 0434 02/21/24 0900 02/22/24 0431  Weight: 68.1 kg 68.1 kg 62.2 kg    History of present illness:  89/F with COPD/chronic hypoxic respiratory failure on 3 L home O2 chronic diastolic CHF, permanent A-fib, valvular heart disease recently hospitalized with inferior lateral STEMI on 3/24, cath showed no evidence of CAD, suspected to have Takotsubo cardiomyopathy, hospitalization noted severe hyponatremia as well, discharged home off diuretics, presented to the ED 4/7 with worsening dyspnea weight gain and swelling. In the ED hypertensive, creatinine 0.8, BNP 1290, chest x-ray with right pleural effusion, EKG noted A-fib   Hospital Course:   Acute on chronic diastolic CHF Moderate mitral regurgitation, severe tricuspid regurgitation Severe pulmonary hypertension -Echo last month 3/25 noted EF 55-60%, mildly reduced RV, moderate MR severe TR, severely elevated PASP -Recently taken off Lasix for hyponatremia -Diuresed with IV Lasix, volume status has improved, 7 L negative, changed to oral Lasix 40 Mg daily and Aldactone 12.5 Mg daily, continue losartan  -Discharged home in a stable condition, close follow-up with [facility]   Hyponatremia -Being with diuresis   Permanent atrial  fibrillation -Metoprolol discontinued for bradycardia, patient declines anticoagulation   Foul-smelling urine, UA was unremarkable   Elevated troponin -Secondary to demand ischemia, no ACS   Chronic hypoxic respiratory failure COPD -On 3 L O2 at baseline   Essential hypertension -Stable, continue metoprolol and losartan   Code Status: DNR  Discharge Exam: Vitals:   02/22/24 0741 02/22/24 0814  BP: (!) 149/82 (!) 148/80  Pulse: 91   Resp: 18   Temp: 97.6 F (36.4 C) 97.9 F (36.6 C)  SpO2: 100% 99%   Gen: Awake, Alert, Oriented X 3,  HEENT: no JVD Lungs: Good air movement bilaterally, CTAB CVS: S1S2/irregular rhythm Abd: soft, Non tender, non distended, BS present Extremities: No edema Skin: no new rashes on exposed skin   Discharge Instructions    Allergies as of 02/22/2024       Reactions   Codeine Nausea And Vomiting        Medication List     TAKE these medications    acetaminophen 500 MG tablet Commonly known as: TYLENOL Take 500 mg by mouth in the morning and at bedtime.   aspirin EC 81 MG tablet Take 81 mg by mouth daily. Swallow whole.   cinacalcet 30 MG tablet Commonly known as: SENSIPAR TAKE 1 TABLET EVERY DAY   cyanocobalamin 1000 MCG tablet Commonly known as: VITAMIN B12 Take 1 tablet (1,000 mcg total) by mouth 2 (two) times a week. Tuesday and thursday   furosemide 20 MG tablet Commonly known as: LASIX Take 1 tablet (20 mg total) by mouth daily.   latanoprost 0.005 % ophthalmic solution Commonly known as: XALATAN INSTILL 1 DROP INTO BOTH EYES AT BEDTIME   losartan 25 MG tablet Commonly  known as: COZAAR TAKE 1 TABLET (25 MG TOTAL) BY MOUTH DAILY.   magnesium oxide 400 (240 Mg) MG tablet Commonly known as: MAG-OX TAKE 1 TABLET EVERY DAY   metoprolol tartrate 25 MG tablet Commonly known as: LOPRESSOR Take 12.5 mg by mouth 2 (two) times daily.   omeprazole 40 MG capsule Commonly known as: PRILOSEC TAKE 1 CAPSULE EVERY  DAY   OXYGEN Inhale 3 mLs into the lungs daily.   pravastatin 20 MG tablet Commonly known as: PRAVACHOL TAKE 1 TABLET EVERY DAY   spironolactone 25 MG tablet Commonly known as: ALDACTONE Take 0.5 tablets (12.5 mg total) by mouth daily.   VITAMIN D PO Take 1,000 Units by mouth at bedtime.       Allergies  Allergen Reactions   Codeine Nausea And Vomiting    Follow-up Information     [facility] Follow up.   Why: outpatient palliative services Contact information: [address] [place]  [phone]                 The results of significant diagnostics from this hospitalization (including imaging, microbiology, ancillary and laboratory) are listed below for reference.    Significant Diagnostic Studies: DG Chest 2 View Result Date: 02/21/2024 CLINICAL DATA:  Hypoxia EXAM: CHEST - 2 VIEW COMPARISON:  02/17/2024 FINDINGS: There is hyperinflation of the lungs compatible with COPD. Cardiomegaly, aortic atherosclerosis. No confluent opacities, effusions or edema. No acute bony abnormality. IMPRESSION: Cardiomegaly.  No active disease. Electronically Signed   By: Annette Mechanic M.D.   On: 02/21/2024 20:04   DG Chest 2 View Result Date: 02/18/2024 CLINICAL DATA:  wt gain EXAM: CHEST - 2 VIEW COMPARISON:  Chest x-ray 03/30/2023, CT chest 12/19/2021 FINDINGS: Patient is markedly rotated. The heart and mediastinal contours are difficult to evaluate given patient rotation. Atherosclerotic plaque. Pattern of circular densities overlying the left chest likely external to patient. No focal consolidation. No pulmonary edema. Trace to small right pleural effusion. No left pleural effusion. No pneumothorax. No acute osseous abnormality. IMPRESSION: 1. Trace to small right pleural effusion. 2. Limited evaluation on frontal view due to patient rotation. Consider repeat PA chest x-ray. 3.  Aortic Atherosclerosis (ICD10-I70.0). Electronically Signed   By:  Annette  Naveau M.D.   On: 02/18/2024 00:36   ECHOCARDIOGRAM COMPLETE Result Date: 02/04/2024    ECHOCARDIOGRAM REPORT   Patient Name:   Annette [name] Date of Exam: 02/04/2024 Medical Rec #:  [id number]       Height:       62.5 in Accession #:    [id number]      Weight:       143.1 lb Date of Birth:  [birth year]      BSA:          1.668 m Patient Age:    89 years        BP:           147/82 mmHg Patient Gender: F               HR:           65 bpm. Exam Location:  Inpatient Procedure: 2D Echo, Cardiac Doppler and Color Doppler (Both Spectral and Color            Flow Doppler were utilized during procedure). Indications:    Takotsubo Cardiomyopathy  History:        Patient has prior history of Echocardiogram examinations, most  recent 03/30/2023. CHF, Acute MI, Arrythmias:Atrial Fibrillation,                 Signs/Symptoms:Syncope; Risk Factors:Hypertension and                 Dyslipidemia.  Sonographer:    Terrilee Few RCS Referring Phys: [id number] IMPRESSIONS  1. Left ventricular ejection fraction, by estimation, is 55 to 60%. The left ventricle has normal function. The left ventricle has no regional wall motion abnormalities. There is mild concentric left ventricular hypertrophy. Left ventricular diastolic parameters are indeterminate.  2. Right ventricular systolic function is mildly reduced. The right ventricular size is severely enlarged. There is severely elevated pulmonary artery systolic pressure. The estimated right ventricular systolic pressure is 65.4 mmHg.  3. Left atrial size was severely dilated.  4. Right atrial size was severely dilated.  5. The mitral valve is normal in structure. Moderate mitral valve regurgitation. No evidence of mitral stenosis.  6. Massive, atrial functional, tricuspid regurgitation with vena contracta width 17 mm and IVC diameter greater than 25 mm. Tricuspid valve regurgitation is severe.  7. The aortic valve is tricuspid. There is mild calcification  of the aortic valve. Aortic valve regurgitation is trivial. Aortic valve sclerosis is present, with no evidence of aortic valve stenosis.  8. Multiple jets. Pulmonic valve regurgitation is moderate to severe. Comparison(s): Prior images reviewed side by side. Both mitral and tricuspid valve regurgitation are worse. FINDINGS  Left Ventricle: No ventricular 3D or strain performed. Left ventricular ejection fraction, by estimation, is 55 to 60%. The left ventricle has normal function. The left ventricle has no regional wall motion abnormalities. Strain was performed and the global longitudinal strain is indeterminate. The left ventricular internal cavity size was small. There is mild concentric left ventricular hypertrophy. Left ventricular diastolic parameters are indeterminate. Right Ventricle: The right ventricular size is severely enlarged. No increase in right ventricular wall thickness. Right ventricular systolic function is mildly reduced. There is severely elevated pulmonary artery systolic pressure. The tricuspid regurgitant velocity is 3.55 m/s, and with an assumed right atrial pressure of 15 mmHg, the estimated right ventricular systolic pressure is 65.4 mmHg. Left Atrium: Left atrial size was severely dilated. Right Atrium: Right atrial size was severely dilated. Pericardium: There is no evidence of pericardial effusion. Mitral Valve: The mitral valve is normal in structure. Moderate mitral valve regurgitation. No evidence of mitral valve stenosis. Tricuspid Valve: Massive, atrial functional, tricuspid regurgitation with vena contracta width 17 mm and IVC diameter greater than 25 mm. The tricuspid valve is normal in structure. Tricuspid valve regurgitation is severe. No evidence of tricuspid stenosis. The flow in the hepatic veins is reversed during ventricular systole. Aortic Valve: The aortic valve is tricuspid. There is mild calcification of the aortic valve. Aortic valve regurgitation is trivial. Aortic  regurgitation PHT measures 858 msec. Aortic valve sclerosis is present, with no evidence of aortic valve stenosis.  Aortic valve peak gradient measures 3.6 mmHg. Pulmonic Valve: Multiple jets. The pulmonic valve was normal in structure. Pulmonic valve regurgitation is moderate to severe. No evidence of pulmonic stenosis. Aorta: The aortic root and ascending aorta are structurally normal, with no evidence of dilitation. IAS/Shunts: No atrial level shunt detected by color flow Doppler. Additional Comments: 3D was performed not requiring image post processing on an independent workstation and was indeterminate.  LEFT VENTRICLE PLAX 2D LVIDd:         3.60 cm   Diastology LVIDs:  2.30 cm   LV e' medial:    8.84 cm/s LV PW:         1.20 cm   LV E/e' medial:  12.4 LV IVS:        1.10 cm   LV e' lateral:   10.30 cm/s LVOT diam:     2.00 cm   LV E/e' lateral: 10.7 LV SV:         38 LV SV Index:   23 LVOT Area:     3.14 cm  RIGHT VENTRICLE             IVC RV S prime:     14.60 cm/s  IVC diam: 3.00 cm TAPSE (M-mode): 1.9 cm LEFT ATRIUM              Index        RIGHT ATRIUM           Index LA diam:        5.70 cm  3.42 cm/m   RA Area:     34.90 cm LA Vol (A2C):   106.0 ml 63.55 ml/m  RA Volume:   143.00 ml 85.73 ml/m LA Vol (A4C):   119.0 ml 71.34 ml/m LA Biplane Vol: 116.0 ml 69.55 ml/m  AORTIC VALVE AV Area (Vmax): 2.27 cm AV Vmax:        95.25 cm/s AV Peak Grad:   3.6 mmHg LVOT Vmax:      68.90 cm/s LVOT Vmean:     41.233 cm/s LVOT VTI:       0.122 m AI PHT:         858 msec  AORTA Ao Root diam: 2.90 cm Ao Asc diam:  3.70 cm MITRAL VALVE                TRICUSPID VALVE MV Area (PHT): 5.02 cm     TR Peak grad:   50.4 mmHg MV Decel Time: 151 msec     TR Vmax:        355.00 cm/s MR Peak grad: 103.4 mmHg MR Vmax:      508.50 cm/s   SHUNTS MV E velocity: 110.00 cm/s  Systemic VTI:  0.12 m MV A velocity: 34.40 cm/s   Systemic Diam: 2.00 cm MV E/A ratio:  3.20 Annette [name] Electronically signed by Annette [name] Signature Date/Time: 02/04/2024/12:41:45 PM    Final    CARDIAC CATHETERIZATION Result Date: 02/03/2024 Images from the original result were not included. Left Heart Catheterization 02/03/24: Hemodynamic data: LVEDP 10 mmHg.  There is no pressure gradient across the aortic valve. Angiographic data: LV: Apical inferior and mid to distal inferior hypokinesis.  No significant MR. Normal coronary arteries, right dominant circulation.  Slow filling noted in all the coronary arteries. LAD: Large D1 which is severely tortuous, large D2, LAD ends before reaching the apex. CX: Large OM1 and moderate to large size OM 2, tortuosity again noted. RCA: Large-caliber vessel nondominant vessel, smooth and normal. Impression and recommendations: No evidence of coronary artery disease.  Suspect Takotsubo cardiomyopathy.    Microbiology: Recent Results (from the past 240 hours)  Resp panel by RT-PCR (RSV, Flu A&B, Covid) Anterior Nasal Swab     Status: None   Collection Time: 02/17/24 10:47 PM   Specimen: Anterior Nasal Swab  Result Value Ref Range Status   SARS Coronavirus 2 by RT PCR NEGATIVE NEGATIVE Final   Influenza A by PCR NEGATIVE NEGATIVE Final   Influenza B by  PCR NEGATIVE NEGATIVE Final    Comment: (NOTE) The Xpert Xpress SARS-CoV-2/FLU/RSV plus assay is intended as an aid in the diagnosis of influenza from Nasopharyngeal swab specimens and should not be used as a sole basis for treatment. Nasal washings and aspirates are unacceptable for Xpert Xpress SARS-CoV-2/FLU/RSV testing.  Fact Sheet for Patients: BloggerCourse.com  Fact Sheet for Healthcare Providers: SeriousBroker.it  This test is not yet approved or cleared by the [place]  FDA and has been authorized for detection and/or diagnosis of SARS-CoV-2 by FDA under an Emergency Use Authorization (EUA). This EUA will remain in effect (meaning this test can be used) for the  duration of the COVID-19 declaration under Section 564(b)(1) of the Act, 21 U.S.C. section 360bbb-3(b)(1), unless the authorization is terminated or revoked.     Resp Syncytial Virus by PCR NEGATIVE NEGATIVE Final    Comment: (NOTE) Fact Sheet for Patients: BloggerCourse.com  Fact Sheet for Healthcare Providers: SeriousBroker.it  This test is not yet approved or cleared by the [place]  FDA and has been authorized for detection and/or diagnosis of SARS-CoV-2 by FDA under an Emergency Use Authorization (EUA). This EUA will remain in effect (meaning this test can be used) for the duration of the COVID-19 declaration under Section 564(b)(1) of the Act, 21 U.S.C. section 360bbb-3(b)(1), unless the authorization is terminated or revoked.  Performed at [facility], [address], [place], [place] [address]      Labs: Basic Metabolic Panel: Recent Labs  Lab 02/18/24 0710 02/19/24 0218 02/20/24 0217 02/21/24 0256 02/21/24 1248 02/22/24 0408  NA 128* 129* 130* 126* 125* 128*  K 3.8 4.2 3.9 4.3 4.6 3.9  CL 85* 87* 86* 84* 79* 84*  CO2 32 35* 36* 33* 36* 36*  GLUCOSE 91 100* 98 107* 94 97  BUN 20 25* 23 22 17 19   CREATININE 0.84 1.02* 0.99 0.92 0.83 0.87  CALCIUM 9.7 9.3 9.5 9.2 9.3 9.2  MG 1.6*  --   --   --   --   --   PHOS 3.3  --   --   --   --   --    Liver Function Tests: Recent Labs  Lab 02/18/24 0710  AST 37  ALT 37  ALKPHOS 64  BILITOT 1.2  PROT 6.2*  ALBUMIN 3.8   No results for input(s): "LIPASE", "AMYLASE" in the last 168 hours. No results for input(s): "AMMONIA" in the last 168 hours. CBC: Recent Labs  Lab 02/17/24 2211 02/18/24 0710 02/19/24 0218  WBC 6.8 4.6 5.9  NEUTROABS  --  3.2  --   HGB 12.7 13.9 12.4  HCT 38.9 41.5 36.7  MCV 94.6 93.0 93.1  PLT 166 158 165   Cardiac Enzymes: No results for input(s): "CKTOTAL", "CKMB", "CKMBINDEX", "TROPONINI" in the last 168  hours. BNP: BNP (last 3 results) Recent Labs    02/17/24 2212 02/18/24 0710 02/21/24 0256  BNP 1,290.1* 1,323.7* 678.6*    ProBNP (last 3 results) No results for input(s): "PROBNP" in the last 8760 hours.  CBG: Recent Labs  Lab 02/22/24 0528  GLUCAP 110*       Signed:  Deforest Fast [name].  [facility] 02/22/2024, 10:57 AM

## 2024-02-22 NOTE — TOC Transition Note (Signed)
 Transition of Care [facility]) - Discharge Note   Patient Details  Name: Annette [name] MRN: [id number] Date of Birth: 1930/07/21  [facility] CM/SW Contact:  Omie Bickers, RN Phone Number: 02/22/2024, 10:37 AM   Clinical Narrative:      Notified [facility] that patient will DC, they are following for palliative svcs.        Patient Goals and CMS Choice            Discharge Placement                       Discharge Plan and Services Additional resources added to the After Visit Summary for                                       Social Drivers of Health (SDOH) Interventions SDOH Screenings   Food Insecurity: No Food Insecurity (02/18/2024)  Housing: Low Risk  (02/18/2024)  Transportation Needs: No Transportation Needs (02/18/2024)  Utilities: Not At Risk (02/18/2024)  Depression (PHQ2-9): Low Risk  (08/08/2023)  Financial Resource Strain: Low Risk  (08/21/2022)  Physical Activity: Inactive (08/21/2022)  Social Connections: Socially Isolated (02/18/2024)  Stress: No Stress Concern Present (08/21/2022)  Tobacco Use: Medium Risk (02/17/2024)     Readmission Risk Interventions     No data to display

## 2024-02-22 NOTE — Progress Notes (Signed)
 Patient's family requesting call with MD, Annette [name] (daughter in law) [phone]. Please disregard previous note regarding call with Mirna Amis.

## 2024-02-24 ENCOUNTER — Telehealth: Payer: Self-pay

## 2024-02-24 NOTE — Transitions of Care (Post Inpatient/ED Visit) (Signed)
   02/24/2024  Name: Annette [name] MRN: [id number] DOB: [birth year]  Today's TOC FU Call Status: Today's TOC FU Call Status:: Successful TOC FU Call Completed TOC FU Call Complete Date: 02/24/24 Patient's Name and Date of Birth confirmed.  Transition Care Management Follow-up Telephone Call Date of Discharge: 02/22/24 Discharge Facility: [facility]) Type of Discharge: Inpatient Admission Primary Inpatient Discharge Diagnosis:: CHF How have you been since you were released from the hospital?: Better (Reports feeling better. No shortness of breath or swelling) Any questions or concerns?: No  Items Reviewed: Did you receive and understand the discharge instructions provided?: Yes Medications obtained,verified, and reconciled?: No Medications Not Reviewed Reasons:: Other: (Patient reports that her family went over all her medications.) Any new allergies since your discharge?: No Dietary orders reviewed?: Yes Type of Diet Ordered:: low sodium heart healthy diet Do you have support at home?: Yes People in Home [RPT]: child(ren), adult  Medications Reviewed Today: Medications Reviewed Today   Medications were not reviewed in this encounter     Home Care and Equipment/Supplies: Were Home Health Services Ordered?: Yes Name of Home Health Agency:: [facility].   Son suggested.  Patient has not heard from them yet but thinks son has spoken to them. Any new equipment or medical supplies ordered?: No  Functional Questionnaire: Do you need assistance with bathing/showering or dressing?: Yes (family) Do you need assistance with meal preparation?: Yes (family) Do you need assistance with eating?: No Do you have difficulty maintaining continence: No Do you need assistance with getting out of bed/getting out of a chair/moving?: No Do you have difficulty managing or taking your medications?: No  Follow up appointments reviewed: PCP Follow-up appointment confirmed?:  Yes Date of PCP follow-up appointment?: 06/30/24 Follow-up Provider: [facility] [place] [facility] Follow-up appointment confirmed?: Yes Date of Specialist follow-up appointment?: 03/10/24 Follow-Up Specialty Provider:: MAdison Renford Cartwright Do you need transportation to your follow-up appointment?: No Do you understand care options if your condition(s) worsen?: Yes-patient verbalized understanding   During call patient reports that this is her time to rest. Reports weight today of 135.1 pounds.  Offered to call patient back at another time and she declined. Reports that her family is taking good care of her.  Assessments and phone call cut short. Reminded patient the importance of follow up with PCP. Provided my contact information if patient wants to call me back.  Orpha Blade, RN, BSN, CEN [employer]- Transition of Care Team.  [facility] [phone]

## 2024-02-26 ENCOUNTER — Ambulatory Visit: Admitting: Internal Medicine

## 2024-03-02 ENCOUNTER — Other Ambulatory Visit: Payer: Self-pay | Admitting: Family Medicine

## 2024-03-02 ENCOUNTER — Other Ambulatory Visit: Payer: Self-pay | Admitting: Cardiology

## 2024-03-02 ENCOUNTER — Telehealth: Payer: Self-pay

## 2024-03-02 ENCOUNTER — Ambulatory Visit: Payer: Self-pay

## 2024-03-02 DIAGNOSIS — R790 Abnormal level of blood mineral: Secondary | ICD-10-CM

## 2024-03-02 NOTE — Telephone Encounter (Signed)
 Noted.

## 2024-03-02 NOTE — Telephone Encounter (Signed)
 error

## 2024-03-02 NOTE — Telephone Encounter (Signed)
 Patient requesting order to be sent for Spironolactone  refill.   Copied from CRM [id number]. Topic: Clinical - Medication Refill >> Mar 02, 2024  5:15 PM Keitha Pata L wrote: Most Recent Primary Care Visit:  Provider: [place], [name]  Department: [facility]  Visit Type: PHYSICAL  Date: 01/06/2024  Medication:spironolactone  (ALDACTONE ) 25 MG tablet  Has the patient contacted their pharmacy? No (Agent: If no, request that the patient contact the pharmacy for the refill. If patient does not wish to contact the pharmacy document the reason why and proceed with request.) (Agent: If yes, when and what did the pharmacy advise?)  Is this the correct pharmacy for this prescription? No If no, delete pharmacy and type the correct one.  This is the patient's preferred pharmacy:  [facility] - [place], [place] - [address] [place] [address] Phone: [phone] Fax: [phone]   Has the prescription been filled recently? No  Is the patient out of the medication? No  Has the patient been seen for an appointment in the last year OR does the patient have an upcoming appointment? No  Can we respond through MyChart? No  Agent: Please be advised that Rx refills may take up to 3 business days. We ask that you follow-up with your pharmacy.

## 2024-03-02 NOTE — Telephone Encounter (Signed)
 Copied from CRM [id number]. Topic: General - Other >> Feb 28, 2024 10:48 AM Martinique E wrote: Reason for CRM: Patient wanted to make front office aware that [facility] will be sending over a fax regarding 2 of the patient's medications.

## 2024-03-02 NOTE — Addendum Note (Signed)
 Addended by: Doll French on: 03/02/2024 09:46 AM   Modules accepted: Orders

## 2024-03-03 ENCOUNTER — Telehealth: Payer: Self-pay | Admitting: Cardiology

## 2024-03-03 MED ORDER — SPIRONOLACTONE 25 MG PO TABS: 12.5000 mg | ORAL_TABLET | Freq: Every day | ORAL | 0 refills | Status: DC

## 2024-03-03 NOTE — Telephone Encounter (Signed)
 Patient had declined starting Coumadin, did she change her mind?

## 2024-03-03 NOTE — Telephone Encounter (Signed)
 Rx has been sent, see other refill encounter.

## 2024-03-03 NOTE — Telephone Encounter (Signed)
 Spoke to patient's daughter n Social worker.She wanted to ask Dr.Schumann if he wants patient to start on Coumadin.Stated she will start Palliative care today.Advised I will send message to Dr.Schumann for advice.

## 2024-03-03 NOTE — Telephone Encounter (Signed)
 Daughter checking as to why pt is being referred for Anticoagulation

## 2024-03-04 ENCOUNTER — Telehealth: Payer: Self-pay

## 2024-03-04 NOTE — Telephone Encounter (Signed)
 Would not recommend starting coumadin if she has been started on hospice.  Coumadin requires frequent blood checks to measure INR, would not recommend this if she has started hospice

## 2024-03-04 NOTE — Telephone Encounter (Signed)
 Copied from CRM [id number]. Topic: Referral - Prior Authorization Question >> Mar 04, 2024 12:05 PM Everlene Hobby D wrote: Pj with [facility] is calling to request a referral for [facility] for hospice for the patient. Call back [phone] fax number: [phone] Needs the order and d they want to know if Dr. [place] wants to manage symptoms and remain as the attending.

## 2024-03-05 ENCOUNTER — Telehealth: Payer: Self-pay | Admitting: Cardiology

## 2024-03-05 DIAGNOSIS — R790 Abnormal level of blood mineral: Secondary | ICD-10-CM

## 2024-03-05 MED ORDER — MAGNESIUM OXIDE -MG SUPPLEMENT 400 (240 MG) MG PO TABS: 1.0000 | ORAL_TABLET | Freq: Every day | ORAL | 3 refills | Status: DC

## 2024-03-05 NOTE — Telephone Encounter (Signed)
*  STAT* If patient is at the pharmacy, call can be transferred to refill team.   1. Which medications need to be refilled? (please list name of each medication and dose if known) Magnesium  Oxide- patient said pharmacist said to resend, they said they did not have it   2. Would you like to learn more about the convenience, safety, & potential cost savings by using the [facility]?    3. Are you open to using the [facility] (Type [facility].   4. Which pharmacy/location (including street and city if local pharmacy) is medication to be sent to?[facility]   5. Do they need a 30 day or 90 day supply? 90 days and refills

## 2024-03-05 NOTE — Telephone Encounter (Signed)
 Pt's medication was sent to pt's pharmacy as requested. Confirmation received.

## 2024-03-05 NOTE — Telephone Encounter (Signed)
 Called and made patient and daughter, Daivd Dub per Dr. Alda Amas would not start Coumadin since patient is starting Hospice. Made daughter aware it requires frequent blood checks to measure INR, daughter Daivd Dub verbalized an understanding.

## 2024-03-06 ENCOUNTER — Telehealth: Payer: Self-pay | Admitting: Family Medicine

## 2024-03-06 NOTE — Telephone Encounter (Signed)
 Recommendations in regard to hospice care can be given by hospice provider. Thanks, BJ

## 2024-03-06 NOTE — Telephone Encounter (Signed)
 Copied from CRM [id number]. Topic: General - Other >> Mar 06, 2024 11:37 AM Alyse July wrote: Reason for CRM: Sherri([facility])  would like a call back with a verbal okay to transition patient from palliative care to hospice care. [phone]

## 2024-03-06 NOTE — Telephone Encounter (Signed)
 It is okay to transfer patient from palliative to hospice care. Thanks, BJ

## 2024-03-06 NOTE — Telephone Encounter (Signed)
 Called and spoke with [facility], verbal given.

## 2024-03-06 NOTE — Telephone Encounter (Signed)
 See other phone note

## 2024-03-09 ENCOUNTER — Ambulatory Visit: Payer: Medicare HMO | Admitting: Internal Medicine

## 2024-03-10 ENCOUNTER — Ambulatory Visit: Admitting: Emergency Medicine

## 2024-03-12 ENCOUNTER — Other Ambulatory Visit: Payer: Self-pay | Admitting: Internal Medicine

## 2024-03-12 DIAGNOSIS — M8580 Other specified disorders of bone density and structure, unspecified site: Secondary | ICD-10-CM

## 2024-03-17 ENCOUNTER — Ambulatory Visit: Admitting: Emergency Medicine

## 2024-03-19 ENCOUNTER — Ambulatory Visit: Payer: Medicare HMO | Admitting: Internal Medicine

## 2024-03-23 ENCOUNTER — Ambulatory Visit: Admitting: Emergency Medicine

## 2024-04-13 ENCOUNTER — Ambulatory Visit: Admitting: Emergency Medicine

## 2024-05-06 ENCOUNTER — Ambulatory Visit: Admitting: Family Medicine

## 2024-05-12 DEATH — deceased

## 2024-05-22 ENCOUNTER — Ambulatory Visit: Admitting: Internal Medicine

## 2024-06-12 ENCOUNTER — Ambulatory Visit: Admitting: Cardiology

## 2024-06-30 ENCOUNTER — Ambulatory Visit: Admitting: Family Medicine

## 2024-07-06 ENCOUNTER — Ambulatory Visit: Payer: Medicare HMO | Admitting: Family Medicine
